# Patient Record
Sex: Female | Born: 1970
Health system: Southern US, Community
[De-identification: ages and names within clinical notes are randomized; demographics above are authoritative.]

## PROBLEM LIST (undated history)

## (undated) DIAGNOSIS — H469 Unspecified optic neuritis: Secondary | ICD-10-CM

## (undated) DIAGNOSIS — E559 Vitamin D deficiency, unspecified: Secondary | ICD-10-CM

## (undated) DIAGNOSIS — M7989 Other specified soft tissue disorders: Secondary | ICD-10-CM

## (undated) DIAGNOSIS — H53139 Sudden visual loss, unspecified eye: Secondary | ICD-10-CM

## (undated) DIAGNOSIS — G35 Multiple sclerosis: Secondary | ICD-10-CM

## (undated) DIAGNOSIS — G4730 Sleep apnea, unspecified: Secondary | ICD-10-CM

## (undated) DIAGNOSIS — I10 Essential (primary) hypertension: Secondary | ICD-10-CM

## (undated) DIAGNOSIS — F419 Anxiety disorder, unspecified: Secondary | ICD-10-CM

## (undated) DIAGNOSIS — E119 Type 2 diabetes mellitus without complications: Secondary | ICD-10-CM

## (undated) DIAGNOSIS — F32A Depression, unspecified: Secondary | ICD-10-CM

## (undated) DIAGNOSIS — F329 Major depressive disorder, single episode, unspecified: Secondary | ICD-10-CM

## (undated) DIAGNOSIS — G5600 Carpal tunnel syndrome, unspecified upper limb: Secondary | ICD-10-CM

## (undated) DIAGNOSIS — R1310 Dysphagia, unspecified: Secondary | ICD-10-CM

## (undated) DIAGNOSIS — K5900 Constipation, unspecified: Secondary | ICD-10-CM

## (undated) DIAGNOSIS — R0602 Shortness of breath: Secondary | ICD-10-CM

## (undated) DIAGNOSIS — E739 Lactose intolerance, unspecified: Secondary | ICD-10-CM

## (undated) DIAGNOSIS — D649 Anemia, unspecified: Secondary | ICD-10-CM

## (undated) DIAGNOSIS — M549 Dorsalgia, unspecified: Secondary | ICD-10-CM

## (undated) DIAGNOSIS — M2550 Pain in unspecified joint: Secondary | ICD-10-CM

## (undated) DIAGNOSIS — E785 Hyperlipidemia, unspecified: Secondary | ICD-10-CM

## (undated) HISTORY — DX: Dysphagia, unspecified: R13.10

## (undated) HISTORY — DX: Hyperlipidemia, unspecified: E78.5

## (undated) HISTORY — DX: Multiple sclerosis: G35

## (undated) HISTORY — DX: Anxiety disorder, unspecified: F41.9

## (undated) HISTORY — DX: Pain in unspecified joint: M25.50

## (undated) HISTORY — DX: Shortness of breath: R06.02

## (undated) HISTORY — DX: Dorsalgia, unspecified: M54.9

## (undated) HISTORY — DX: Sudden visual loss, unspecified eye: H53.139

## (undated) HISTORY — DX: Other specified soft tissue disorders: M79.89

## (undated) HISTORY — DX: Essential (primary) hypertension: I10

## (undated) HISTORY — PX: OTHER SURGICAL HISTORY: SHX169

## (undated) HISTORY — PX: WISDOM TOOTH EXTRACTION: SHX21

## (undated) HISTORY — DX: Carpal tunnel syndrome, unspecified upper limb: G56.00

## (undated) HISTORY — DX: Anemia, unspecified: D64.9

## (undated) HISTORY — DX: Lactose intolerance, unspecified: E73.9

## (undated) HISTORY — DX: Depression, unspecified: F32.A

## (undated) HISTORY — PX: CYST EXCISION: SHX5701

## (undated) HISTORY — PX: BREAST REDUCTION SURGERY: SHX8

## (undated) HISTORY — DX: Unspecified optic neuritis: H46.9

## (undated) HISTORY — DX: Vitamin D deficiency, unspecified: E55.9

## (undated) HISTORY — DX: Major depressive disorder, single episode, unspecified: F32.9

## (undated) HISTORY — DX: Constipation, unspecified: K59.00

## (undated) HISTORY — PX: TUBAL LIGATION: SHX77

## (undated) HISTORY — DX: Type 2 diabetes mellitus without complications: E11.9

---

## 1998-05-31 ENCOUNTER — Other Ambulatory Visit: Admission: RE | Admit: 1998-05-31 | Discharge: 1998-05-31 | Payer: Self-pay | Admitting: Obstetrics & Gynecology

## 1999-07-12 ENCOUNTER — Other Ambulatory Visit: Admission: RE | Admit: 1999-07-12 | Discharge: 1999-07-12 | Payer: Self-pay | Admitting: Obstetrics & Gynecology

## 2001-07-22 ENCOUNTER — Encounter: Admission: RE | Admit: 2001-07-22 | Discharge: 2001-10-20 | Payer: Self-pay | Admitting: Internal Medicine

## 2001-10-15 ENCOUNTER — Other Ambulatory Visit: Admission: RE | Admit: 2001-10-15 | Discharge: 2001-10-15 | Payer: Self-pay | Admitting: Internal Medicine

## 2001-11-04 ENCOUNTER — Encounter: Payer: Self-pay | Admitting: Emergency Medicine

## 2001-11-04 ENCOUNTER — Emergency Department (HOSPITAL_COMMUNITY): Admission: EM | Admit: 2001-11-04 | Discharge: 2001-11-04 | Payer: Self-pay | Admitting: Emergency Medicine

## 2002-01-30 ENCOUNTER — Encounter: Admission: RE | Admit: 2002-01-30 | Discharge: 2002-04-30 | Payer: Self-pay | Admitting: Internal Medicine

## 2002-05-15 ENCOUNTER — Encounter: Admission: RE | Admit: 2002-05-15 | Discharge: 2002-08-13 | Payer: Self-pay | Admitting: Internal Medicine

## 2003-12-25 ENCOUNTER — Emergency Department (HOSPITAL_COMMUNITY): Admission: AD | Admit: 2003-12-25 | Discharge: 2003-12-25 | Payer: Self-pay | Admitting: Family Medicine

## 2004-03-07 ENCOUNTER — Emergency Department (HOSPITAL_COMMUNITY): Admission: AD | Admit: 2004-03-07 | Discharge: 2004-03-07 | Payer: Self-pay | Admitting: Family Medicine

## 2005-09-05 ENCOUNTER — Emergency Department (HOSPITAL_COMMUNITY): Admission: EM | Admit: 2005-09-05 | Discharge: 2005-09-05 | Payer: Self-pay | Admitting: Family Medicine

## 2006-04-18 ENCOUNTER — Emergency Department (HOSPITAL_COMMUNITY): Admission: EM | Admit: 2006-04-18 | Discharge: 2006-04-18 | Payer: Self-pay | Admitting: Family Medicine

## 2007-09-03 ENCOUNTER — Encounter: Admission: RE | Admit: 2007-09-03 | Discharge: 2007-09-03 | Payer: Self-pay | Admitting: Plastic Surgery

## 2007-09-30 ENCOUNTER — Emergency Department (HOSPITAL_COMMUNITY): Admission: EM | Admit: 2007-09-30 | Discharge: 2007-09-30 | Payer: Self-pay | Admitting: Emergency Medicine

## 2007-10-15 ENCOUNTER — Encounter (INDEPENDENT_AMBULATORY_CARE_PROVIDER_SITE_OTHER): Payer: Self-pay | Admitting: Plastic Surgery

## 2007-10-15 ENCOUNTER — Ambulatory Visit (HOSPITAL_BASED_OUTPATIENT_CLINIC_OR_DEPARTMENT_OTHER): Admission: RE | Admit: 2007-10-15 | Discharge: 2007-10-16 | Payer: Self-pay | Admitting: Plastic Surgery

## 2008-01-29 ENCOUNTER — Encounter: Admission: RE | Admit: 2008-01-29 | Discharge: 2008-01-29 | Payer: Self-pay | Admitting: Internal Medicine

## 2008-04-04 ENCOUNTER — Emergency Department (HOSPITAL_COMMUNITY): Admission: EM | Admit: 2008-04-04 | Discharge: 2008-04-04 | Payer: Self-pay | Admitting: Family Medicine

## 2008-06-10 ENCOUNTER — Encounter: Admission: RE | Admit: 2008-06-10 | Discharge: 2008-06-10 | Payer: Self-pay | Admitting: Internal Medicine

## 2008-08-04 ENCOUNTER — Emergency Department (HOSPITAL_COMMUNITY): Admission: EM | Admit: 2008-08-04 | Discharge: 2008-08-04 | Payer: Self-pay | Admitting: Emergency Medicine

## 2010-09-22 ENCOUNTER — Ambulatory Visit: Payer: Self-pay | Admitting: Internal Medicine

## 2010-09-22 ENCOUNTER — Encounter: Payer: Self-pay | Admitting: Physician Assistant

## 2010-09-22 DIAGNOSIS — E1165 Type 2 diabetes mellitus with hyperglycemia: Secondary | ICD-10-CM | POA: Insufficient documentation

## 2010-09-22 DIAGNOSIS — E559 Vitamin D deficiency, unspecified: Secondary | ICD-10-CM | POA: Insufficient documentation

## 2010-09-22 DIAGNOSIS — E1169 Type 2 diabetes mellitus with other specified complication: Secondary | ICD-10-CM | POA: Insufficient documentation

## 2010-09-22 DIAGNOSIS — I10 Essential (primary) hypertension: Secondary | ICD-10-CM | POA: Insufficient documentation

## 2010-09-22 DIAGNOSIS — D509 Iron deficiency anemia, unspecified: Secondary | ICD-10-CM | POA: Insufficient documentation

## 2010-09-22 LAB — CONVERTED CEMR LAB: Blood Glucose, Fingerstick: 326

## 2010-09-23 LAB — CONVERTED CEMR LAB
BUN: 11 mg/dL (ref 6–23)
CO2: 23 meq/L (ref 19–32)
Calcium: 9.4 mg/dL (ref 8.4–10.5)
Chloride: 105 meq/L (ref 96–112)
Creatinine, Ser: 0.71 mg/dL (ref 0.40–1.20)
Eosinophils Relative: 1 % (ref 0–5)
Glucose, Bld: 281 mg/dL — ABNORMAL HIGH (ref 70–99)
HCT: 36.8 % (ref 36.0–46.0)
Hemoglobin: 11.3 g/dL — ABNORMAL LOW (ref 12.0–15.0)
Lymphocytes Relative: 40 % (ref 12–46)
MCHC: 30.7 g/dL (ref 30.0–36.0)
Monocytes Absolute: 0.7 10*3/uL (ref 0.1–1.0)
Monocytes Relative: 9 % (ref 3–12)
Neutro Abs: 4.2 10*3/uL (ref 1.7–7.7)
RBC: 4.71 M/uL (ref 3.87–5.11)
TSH: 1.644 microintl units/mL (ref 0.350–4.500)
Vit D, 25-Hydroxy: 18 ng/mL — ABNORMAL LOW (ref 30–89)

## 2010-09-26 ENCOUNTER — Encounter: Payer: Self-pay | Admitting: Physician Assistant

## 2010-09-27 ENCOUNTER — Encounter: Payer: Self-pay | Admitting: Physician Assistant

## 2010-09-27 ENCOUNTER — Ambulatory Visit: Payer: Self-pay | Admitting: Internal Medicine

## 2010-09-27 LAB — CONVERTED CEMR LAB
Iron: 26 ug/dL — ABNORMAL LOW (ref 42–145)
UIBC: 403 ug/dL

## 2010-10-11 ENCOUNTER — Ambulatory Visit: Payer: Self-pay | Admitting: Nurse Practitioner

## 2010-10-11 LAB — CONVERTED CEMR LAB
BUN: 10 mg/dL (ref 6–23)
CO2: 22 meq/L (ref 19–32)
Calcium: 9.2 mg/dL (ref 8.4–10.5)
Creatinine, Ser: 0.61 mg/dL (ref 0.40–1.20)
Glucose, Bld: 164 mg/dL — ABNORMAL HIGH (ref 70–99)
Sodium: 139 meq/L (ref 135–145)
Total CHOL/HDL Ratio: 2.7

## 2010-10-12 ENCOUNTER — Encounter (INDEPENDENT_AMBULATORY_CARE_PROVIDER_SITE_OTHER): Payer: Self-pay | Admitting: Nurse Practitioner

## 2010-10-24 ENCOUNTER — Ambulatory Visit: Payer: Self-pay | Admitting: Nurse Practitioner

## 2010-10-24 DIAGNOSIS — J029 Acute pharyngitis, unspecified: Secondary | ICD-10-CM

## 2010-10-24 LAB — CONVERTED CEMR LAB
Blood Glucose, Fingerstick: 185
Cholesterol, target level: 200 mg/dL
Glucose, Urine, Semiquant: 100
Hgb A1c MFr Bld: 9.9 %
Protein, U semiquant: 30
WBC Urine, dipstick: NEGATIVE

## 2010-11-24 ENCOUNTER — Ambulatory Visit: Payer: Self-pay | Admitting: Nurse Practitioner

## 2010-11-24 DIAGNOSIS — N926 Irregular menstruation, unspecified: Secondary | ICD-10-CM | POA: Insufficient documentation

## 2010-11-28 ENCOUNTER — Ambulatory Visit (HOSPITAL_COMMUNITY)
Admission: RE | Admit: 2010-11-28 | Discharge: 2010-11-28 | Payer: Self-pay | Source: Home / Self Care | Attending: Internal Medicine | Admitting: Internal Medicine

## 2010-11-28 ENCOUNTER — Telehealth (INDEPENDENT_AMBULATORY_CARE_PROVIDER_SITE_OTHER): Payer: Self-pay | Admitting: Nurse Practitioner

## 2010-12-27 ENCOUNTER — Encounter (INDEPENDENT_AMBULATORY_CARE_PROVIDER_SITE_OTHER): Payer: Self-pay | Admitting: *Deleted

## 2011-01-10 ENCOUNTER — Encounter (INDEPENDENT_AMBULATORY_CARE_PROVIDER_SITE_OTHER): Payer: Self-pay | Admitting: Nurse Practitioner

## 2011-01-11 LAB — CONVERTED CEMR LAB
Free Thyroxine Index: 2.3 (ref 1.0–3.9)
Prolactin: 24.2 ng/mL
T3 Uptake Ratio: 33.7 % (ref 22.5–37.0)

## 2011-01-17 NOTE — Assessment & Plan Note (Signed)
Summary: Diabetes/HTN   Vital Signs:  Patient profile:   40 year old female Menstrual status:  irregular LMP:     10/2010 Weight:      318.6 pounds BMI:     51.61 Temp:     98.5 degrees F oral Pulse rate:   76 / minute Pulse rhythm:   regular Resp:     20 per minute BP sitting:   140 / 90  (left arm) Cuff size:   regular  Vitals Entered By: Levon Hedger (October 24, 2010 8:31 AM)  Nutrition Counseling: Patient's BMI is greater than 25 and therefore counseled on weight management options. CC: 1 month followup DM....cold x 1 week and a half, Hypertension Management, Lipid Management Is Patient Diabetic? Yes Pain Assessment Patient in pain? no      CBG Result 185 CBG Device ID B  Does patient need assistance? Functional Status Self care Ambulation Normal LMP (date): 10/2010     Menstrual Status irregular Enter LMP: 10/2010   CC:  1 month followup DM....cold x 1 week and a half, Hypertension Management, and Lipid Management.  History of Present Illness:  Pt into the office for f/u on diabetes.  Diabetes - pt is checking her blood sugar three times per day. She presents today with her blood sugar log. Blood sugar values are over 200 on most days before breakfast. Pt states that she is taking meds as ordered. She just restarted on her medications 1 month ago. She has been without her meds from May to October.  Obesity - weight up 8 pounds since last visit  Diabetes Management History:      The patient is a 40 years old female who comes in for evaluation of DM Type 2.  She is (or has been) enrolled in the "Diabetic Education Program".  She states understanding of dietary principles and is following her diet appropriately.  No sensory loss is reported.  Self foot exams are not being performed.  She is checking home blood sugars.  She says that she is exercising.        Hypoglycemic symptoms are not occurring.  No hyperglycemic symptoms are reported.  Other comments  include: Pt has started going to [employer] class.        There are no symptoms to suggest diabetic complications.  No changes have been made to her treatment plan since last visit.    Hypertension History:      She denies headache, chest pain, and palpitations.  No medications today yet due to fasting status.        Positive major cardiovascular risk factors include diabetes and hypertension.  Negative major cardiovascular risk factors include female age less than 68 years old and non-tobacco-user status.        Further assessment for target organ damage reveals no history of ASHD, stroke/TIA, or peripheral vascular disease.    Lipid Management History:      Positive NCEP/ATP III risk factors include diabetes and hypertension.  Negative NCEP/ATP III risk factors include female age less than 78 years old, no history of early menopause without estrogen hormone replacement, HDL cholesterol greater than 60, non-tobacco-user status, no ASHD (atherosclerotic heart disease), no prior stroke/TIA, no peripheral vascular disease, and no history of aortic aneurysm.        The patient states that she does not know about the "Therapeutic Lifestyle Change" diet.  The patient does not know about adjunctive measures for cholesterol lowering.  Adjunctive measures started  by the patient include aerobic exercise.  Comments include: labs done during recent visit reviewed with pt today.       Habits & Providers  Exercise-Depression-Behavior     Does Patient Exercise: yes  Allergies (verified): No Known Drug Allergies  Social History: Does Patient Exercise:  yes  Review of Systems General:  Complains of fatigue. ENT:  Complains of nasal congestion; Uses VIcs Vapor rub at night. She has been taking Delsym for cough and Sudafed PE OTC.Marland Kitchen CV:  Denies chest pain or discomfort. Resp:  Complains of cough. Endo:  Complains of excessive urination; denies excessive thirst.  Physical Exam  General:   alert.  obese Head:  normocephalic.   Ears:  ear piercing(s) noted.   Bil ears with clear fluit behind TM Nose:  nasal congestion Lungs:  normal breath sounds.   Heart:  normal rate and regular rhythm.   Abdomen:  normal bowel sounds.   Msk:  normal ROM.   Neurologic:  gait normal.   Skin:  color normal.   Psych:  Oriented X3.    Diabetes Management Exam:    Foot Exam (with socks and/or shoes not present):       Inspection:          Left foot: normal          Right foot: normal       Nails:          Left foot: thickened          Right foot: thickened   Impression & Recommendations:  Problem # 1:  DIABETES MELLITUS, TYPE II (ICD-250.00) increase insulin to 34 units daily flu vaccine given today pt has started diabetes group Her updated medication list for this problem includes:    Janumet 50-500 Mg Tabs (Sitagliptin-metformin hcl) .Marland Kitchen... Take 1 tablet by mouth two times a day for diabetes    Lantus Solostar 100 Unit/ml Soln (Insulin glargine) ..... Inject 34 units at bedtime for diabetes    Lisinopril 20 Mg Tabs (Lisinopril) .Marland Kitchen... Take 1 tablet by mouth once a day for blood pressure  Orders: Capillary Blood Glucose/CBG (82948) Hemoglobin A1C (83036) UA Dipstick w/o Micro (manual) (29562) T-Urine Microalbumin w/creat. ratio [phone])  Her updated medication list for this problem includes:    Janumet 50-500 Mg Tabs (Sitagliptin-metformin hcl) .Marland Kitchen... Take 1 tablet by mouth two times a day for diabetes    Lantus Solostar 100 Unit/ml Soln (Insulin glargine) ..... Inject 24 units at bedtime for diabets    Lisinopril 20 Mg Tabs (Lisinopril) .Marland Kitchen... Take 1 tablet by mouth once a day for blood pressure  Problem # 2:  HYPERTENSION (ICD-401.9) BP is elevated today - pt has not taken her meds today DASH diet will recheck at next visit  Her updated medication list for this problem includes:    Lisinopril 20 Mg Tabs (Lisinopril) .Marland Kitchen... Take 1 tablet by mouth once a day for  blood pressure  Problem # 3:  PHARYNGITIS, ACUTE (ICD-462) advised conservative therapy meds without decongestants  Problem # 4:  OBESITY (ICD-278.00) advised pt that she needs to decrease weight, increase activities  Complete Medication List: 1)  Janumet 50-500 Mg Tabs (Sitagliptin-metformin hcl) .... Take 1 tablet by mouth two times a day for diabetes 2)  Lantus Solostar 100 Unit/ml Soln (Insulin glargine) .... Inject 34 units at bedtime for diabetes 3)  Lisinopril 20 Mg Tabs (Lisinopril) .... Take 1 tablet by mouth once a day for blood pressure 4)  Onetouch Ultra Blue Strp (  Glucose blood) .... Check sugars three times a day before meals 5)  Vitamin D (ergocalciferol) 50000 Unit Caps (Ergocalciferol) .... Take one by mouth once a week for 12 weeks 6)  Nu-iron 150 Mg Caps (Polysaccharide iron complex) .... Take 1 capsule by mouth two times a day  Diabetes Management Assessment/Plan:      The following lipid goals have been established for the patient: Total cholesterol goal of 200; LDL cholesterol goal of 100; HDL cholesterol goal of 40; Triglyceride goal of 150.  Her blood pressure goal is < 130/80.    Hypertension Assessment/Plan:      The patient's hypertensive risk group is category C: Target organ damage and/or diabetes.  Her calculated 10 year risk of coronary heart disease is 2 %.  Today's blood pressure is 140/90.  Her blood pressure goal is < 130/80.  Lipid Assessment/Plan:      Based on NCEP/ATP III, the patient's risk factor category is "history of diabetes".  The patient's lipid goals are as follows: Total cholesterol goal is 200; LDL cholesterol goal is 100; HDL cholesterol goal is 40; Triglyceride goal is 150.    Patient Instructions: 1)  You have been given the flu vaccine today.  This will only protect you from the FLU virus 2)  For your current cold symptoms continue to take coricidan or Delsym over the counter. 3)  May also take vitamin C or Airborne to help build up  your immune system. 4)  Drink warm steamy liquids such at tea to help soothe your throat 5)  Diabetes - Increase insulin to 34 units nightly. 6)  your Hgba1c = 9.9.  The goal is less than 7.  This should get better with medication and diet changes 7)  Follow up with n.martin,fnp in 4-6 weeks for diabetes. 8)  Will need cbg, u/a, foot check, pneumovax and recheck blood pressure. 9)  Will need to schedule CPE and retasure.   Orders Added: 1)  Capillary Blood Glucose/CBG [82948] 2)  Est. Patient Level IV [16109] 3)  Hemoglobin A1C [83036] 4)  UA Dipstick w/o Micro (manual) [81002] 5)  T-Urine Microalbumin w/creat. ratio [[id number]]    Prevention & Chronic Care Immunizations   Influenza vaccine: Not documented    Tetanus booster: Not documented    Pneumococcal vaccine: Not documented  Other Screening   Pap smear: Not documented   Smoking status: never  (09/22/2010)  Diabetes Mellitus   HgbA1C: 9.9  (10/24/2010)   HgbA1C action/deferral: Ordered  (10/24/2010)    Eye exam: Not documented    Foot exam: yes  (10/24/2010)   Foot exam action/deferral: Do today   High risk foot: Not documented   Foot care education: Done  (10/24/2010)    Urine microalbumin/creatinine ratio: Not documented   Urine microalbumin action/deferral: Ordered   Urine microalbumin/cr due: 10/25/2011  Lipids   Total Cholesterol: 183  (10/11/2010)   LDL: 99  (10/11/2010)   LDL Direct: Not documented   HDL: 68  (10/11/2010)   Triglycerides: 82  (10/11/2010)  Hypertension   Last Blood Pressure: 140 / 90  (10/24/2010)   Serum creatinine: 0.61  (10/11/2010)   Serum potassium 5.0  (10/11/2010)  Self-Management Support :    Diabetes self-management support: Not documented    Hypertension self-management support: Not documented   Nursing Instructions: Give Flu vaccine today   Diabetic Foot Exam Foot Inspection Is there a history of a foot ulcer?  No Is there a foot ulcer  now?              No Can the patient see the bottom of their feet?          Yes Are the shoes appropriate in style and fit?          Yes Is there swelling or an abnormal foot shape?          No Are the toenails long?                Yes Are the toenails thick?                Yes Are the toenails ingrown?              No Is there heavy callous build-up?              No Is there pain in the calf muscle (Intermittent claudication) when walking?    NoIs there a claw toe deformity?              No Is there elevated skin temperature?            No Is there limited ankle dorsiflexion?            No Is there foot or ankle muscle weakness?            No  Diabetic Foot Care Education Patient educated on appropriate care of diabetic feet.  Pulse Check          Right Foot          Left Foot Dorsalis Pedis:        normal            normal   Laboratory Results   Urine Tests  Date/Time Received: October 24, 2010 9:15 AM   Routine Urinalysis   Color: yellow Glucose: 100   (Normal Range: Negative) Bilirubin: negative   (Normal Range: Negative) Ketone: trace (5)   (Normal Range: Negative) Spec. Gravity: >=1.030   (Normal Range: 1.003-1.035) Blood: negative   (Normal Range: Negative) pH: 5.5   (Normal Range: 5.0-8.0) Protein: 30   (Normal Range: Negative) Urobilinogen: 0.2   (Normal Range: 0-1) Nitrite: negative   (Normal Range: Negative) Leukocyte Esterace: negative   (Normal Range: Negative)     Blood Tests   Date/Time Received: October 24, 2010 9:16 AM   HGBA1C: 9.9%   (Normal Range: Non-Diabetic - 3-6%   Control Diabetic - 6-8%) CBG Random:: 185mg /dL      Appended Document: Diabetes/HTN     Allergies: No Known Drug Allergies   Complete Medication List: 1)  Janumet 50-500 Mg Tabs (Sitagliptin-metformin hcl) .... Take 1 tablet by mouth two times a day for diabetes 2)  Lantus Solostar 100 Unit/ml Soln (Insulin glargine) .... Inject 34 units at bedtime for diabetes 3)   Lisinopril 20 Mg Tabs (Lisinopril) .... Take 1 tablet by mouth once a day for blood pressure 4)  Onetouch Ultra Blue Strp (Glucose blood) .... Check sugars three times a day before meals 5)  Vitamin D (ergocalciferol) 50000 Unit Caps (Ergocalciferol) .... Take one by mouth once a week for 12 weeks 6)  Nu-iron 150 Mg Caps (Polysaccharide iron complex) .... Take 1 capsule by mouth two times a day  Other Orders: Flu Vaccine 66yrs + (40102) Admin 1st Vaccine (72536)   Orders Added: 1)  Flu Vaccine 48yrs + [90658] 2)  Admin 1st Vaccine [64403]  Immunizations Administered:  Influenza Vaccine # 1:    Vaccine Type: Fluvax 3+    Site: right deltoid    Mfr: [email or web address]    Dose: 0.5 ml    Route: IM    Given by: Levon Hedger    Exp. Date: 06/17/2011    Lot #: [id number]    VIS given: 07/12/10 version given October 24, 2010.  Flu Vaccine Consent Questions:    Do you have a history of severe allergic reactions to this vaccine? no    Any prior history of allergic reactions to egg and/or gelatin? no    Do you have a sensitivity to the preservative Thimersol? no    Do you have a past history of Guillan-Barre Syndrome? no    Do you currently have an acute febrile illness? no    Have you ever had a severe reaction to latex? no    Vaccine information given and explained to patient? yes    Are you currently pregnant? no    ndc  [phone]  Immunizations Administered:  Influenza Vaccine # 1:    Vaccine Type: Fluvax 3+    Site: right deltoid    Mfr: GlaxoSmithKline    Dose: 0.5 ml    Route: IM    Given by: Levon Hedger    Exp. Date: 06/17/2011    Lot #: [id number]    VIS given: 07/12/10 version given October 24, 2010.

## 2011-01-17 NOTE — Letter (Signed)
Summary: TEST ORDER FORM//[facility]//APPT DATE & TIME  TEST ORDER FORM//[facility]//APPT DATE & TIME   Imported By: Arta Bruce 11/25/2010 11:16:36  _____________________________________________________________________  External Attachment:    Type:   Image     Comment:   External Document

## 2011-01-17 NOTE — Letter (Signed)
Summary: NUTRITION /SUSIE  NUTRITION /SUSIE   Imported By: Arta Bruce 10/06/2010 10:25:11  _____________________________________________________________________  External Attachment:    Type:   Image     Comment:   External Document

## 2011-01-17 NOTE — Assessment & Plan Note (Signed)
Summary: Diabetes   Vital Signs:  Patient profile:   40 year old female Menstrual status:  irregular Weight:      322.1 pounds BMI:     52.18 Temp:     97.8 degrees F oral Pulse rate:   64 / minute Pulse rhythm:   regular Resp:     16 per minute BP sitting:   126 / 90  (left arm) Cuff size:   regular  Vitals Entered By: Levon Hedger (November 24, 2010 3:11 PM)  Nutrition Counseling: Patient's BMI is greater than 25 and therefore counseled on weight management options. CC: follow-up visit...cycle stays on extended time with clots and cramps, Hypertension Management Is Patient Diabetic? Yes Pain Assessment Patient in pain? yes     Location: hip, knee CBG Result 186 CBG Device ID B  Does patient need assistance? Functional Status Self care Ambulation Normal   CC:  follow-up visit...cycle stays on extended time with clots and cramps and Hypertension Management.  History of Present Illness:  Pt int the office for diabetes f/u She has restarted on meds since her last visit.  Menses - "My cycle stays on all the time" Starting since the beginning of 2011 no cramps just mainly heavy flow. She is taking the iron pills as ordered  Diabetes Management History:      The patient is a 40 years old female who comes in for evaluation of DM Type 2.  She is (or has been) enrolled in the "Diabetic Education Program".  She states understanding of dietary principles and is following her diet appropriately.  No sensory loss is reported.  Self foot exams are not being performed.  She is checking home blood sugars.  She says that she is exercising.        Hypoglycemic symptoms are not occurring.  No hyperglycemic symptoms are reported.  Other comments include: pt has restarted meds since last visit in office.        No changes have been made to her treatment plan since last visit.    Hypertension History:      She denies headache, chest pain, and palpitations.  She notes no problems with  any antihypertensive medication side effects.        Positive major cardiovascular risk factors include diabetes and hypertension.  Negative major cardiovascular risk factors include female age less than 96 years old, no history of hyperlipidemia, and non-tobacco-user status.        Further assessment for target organ damage reveals no history of ASHD, cardiac end-organ damage (CHF/LVH), stroke/TIA, peripheral vascular disease, renal insufficiency, or hypertensive retinopathy.     Habits & Providers  Alcohol-Tobacco-Diet     Alcohol drinks/day: 0     Tobacco Status: never  Exercise-Depression-Behavior     Does Patient Exercise: yes     Drug Use: no  Allergies (verified): No Known Drug Allergies  Review of Systems General:  Complains of fatigue. CV:  Denies fatigue. Resp:  Denies cough. GI:  Complains of abdominal pain and nausea; denies vomiting; starting in early 2011 menses started to be very heavy with clotting. No cramping.  . GU:  Complains of abnormal vaginal bleeding; denies discharge. MS:  Complains of low back pain.  Physical Exam  General:  alert.   Head:  normocephalic.   Lungs:  normal breath sounds.   Heart:  normal rate and regular rhythm.   Abdomen:  normal bowel sounds.   Msk:  normal ROM.   Neurologic:  alert &  oriented X3.   Skin:  color normal.   Psych:  Oriented X3.     Impression & Recommendations:  Problem # 1:  DIABETES MELLITUS, TYPE II (ICD-250.00) Pt has restarted her meds as ordered advised her to keep taking as ordered Her updated medication list for this problem includes:    Janumet 50-500 Mg Tabs (Sitagliptin-metformin hcl) .Marland Kitchen... Take 1 tablet by mouth two times a day for diabetes    Lantus Solostar 100 Unit/ml Soln (Insulin glargine) ..... Inject 34 units at bedtime for diabetes    Lisinopril 20 Mg Tabs (Lisinopril) .Marland Kitchen... Take 1 tablet by mouth once a day for blood pressure  Orders: Capillary Blood Glucose/CBG (16109)  Problem # 2:   HYPERTENSION (ICD-401.9) BP is doing well Her updated medication list for this problem includes:    Lisinopril 20 Mg Tabs (Lisinopril) .Marland Kitchen... Take 1 tablet by mouth once a day for blood pressure  Problem # 3:  ANEMIA-IRON DEFICIENCY (ICD-280.9) pt is taking meds as ordered Her updated medication list for this problem includes:    Nu-iron 150 Mg Caps (Polysaccharide iron complex) .Marland Kitchen... Take 1 capsule by mouth two times a day  Problem # 4:  OBESITY (ICD-278.00) pt is mindful of need to lose weight  Problem # 5:  IRREGULAR MENSES (ICD-626.4) will send for u/u.  Orders: Ultrasound (Ultrasound)  Complete Medication List: 1)  Janumet 50-500 Mg Tabs (Sitagliptin-metformin hcl) .... Take 1 tablet by mouth two times a day for diabetes 2)  Lantus Solostar 100 Unit/ml Soln (Insulin glargine) .... Inject 34 units at bedtime for diabetes 3)  Lisinopril 20 Mg Tabs (Lisinopril) .... Take 1 tablet by mouth once a day for blood pressure 4)  Onetouch Ultra Blue Strp (Glucose blood) .... Check sugars three times a day before meals 5)  Vitamin D (ergocalciferol) 50000 Unit Caps (Ergocalciferol) .... Take one by mouth once a week for 12 weeks 6)  Nu-iron 150 Mg Caps (Polysaccharide iron complex) .... Take 1 capsule by mouth two times a day  Diabetes Management Assessment/Plan:      The following lipid goals have been established for the patient: Total cholesterol goal of 200; LDL cholesterol goal of 100; HDL cholesterol goal of 40; Triglyceride goal of 150.  Her blood pressure goal is < 130/80.    Hypertension Assessment/Plan:      The patient's hypertensive risk group is category C: Target organ damage and/or diabetes.  Her calculated 10 year risk of coronary heart disease is 2 %.  Today's blood pressure is 126/90.  Her blood pressure goal is < 130/80.  Patient Instructions: 1)  Diabetes - Keep taking your blood sugar medications as ordered 2)  Blood pressure - doing well.  Keep up the good work with  watch sodium in the diet.  Keep taking lisinopril 3)  Abnormal vagainal bleeding - You will be referred for ultrasound.  Depending on the results will decide how to proceed. 4)  Follow up in 2 months for diabetes. 5)  You will need cbg, hgba1c, u/a, pneumovax and review results of ultrasound if not already done  Diabetic Foot Exam Foot Inspection Is there a history of a foot ulcer?              No Is there a foot ulcer now?              No Can the patient see the bottom of their feet?          No Are the  shoes appropriate in style and fit?          Yes Is there swelling or an abnormal foot shape?          No Are the toenails long?                Yes Are the toenails thick?                Yes Are the toenails ingrown?              No Is there heavy callous build-up?              No Is there pain in the calf muscle (Intermittent claudication) when walking?    NoIs there a claw toe deformity?              No Is there elevated skin temperature?            No Is there limited ankle dorsiflexion?            No Is there foot or ankle muscle weakness?            No  Diabetic Foot Care Education Patient educated on appropriate care of diabetic feet.  Pulse Check          Right Foot          Left Foot Dorsalis Pedis:        normal            normal    Orders Added: 1)  Capillary Blood Glucose/CBG [82948] 2)  Est. Patient Level III [60630] 3)  Ultrasound [Ultrasound]

## 2011-01-17 NOTE — Assessment & Plan Note (Signed)
Summary: NP:  DM2, HTN   Vital Signs:  Patient profile:   40 year old female Height:      66 inches Weight:      310.8 pounds BMI:     50.35 Temp:     98.0 degrees F oral Pulse rate:   88 / minute Pulse rhythm:   regular Resp:     20 per minute BP sitting:   156 / 94  (left arm) Cuff size:   regular  Vitals Entered By: CMA Linzie Collin CC: new patient visit, DM and BP, currently not on medication but was previously on medications for DM and BP Is Patient Diabetic? Yes Pain Assessment Patient in pain? no      CBG Result 326  Does patient need assistance? Functional Status Self care Ambulation Normal   CC:  new patient visit, DM and BP, and currently not on medication but was previously on medications for DM and BP.  History of Present Illness: New pt.  Previously followed by Dr. Renae Gloss. Previously taking: Janumet ? dose two times a day  Lantus 24 units Vit D Lisinopril 20 mg once daily  She has been out of meds since the end of May.  She has been trying to watch her diet and exercise.  She has felt lightheaded and nauseated at times.  NOtes polydipsia.  States her sugars were ok on above meds. . .  but does admit she had an A1C of 10 last time.  BPs usually ok with Lisinopril but were going up when last seen.  Had retasure in May at Dr. Mathews Robinsons.    Habits & Providers  Alcohol-Tobacco-Diet     Tobacco Status: never  Exercise-Depression-Behavior     Drug Use: no  Allergies (verified): No Known Drug Allergies  Past History:  Past Medical History: Diabetes mellitus, type II Hypertension Anemia-iron deficiency h/o menorrhagia Vitamin D deficiency  Past Surgical History: s/p breast reduction 2009 oral cyst removed s/p ovarian cyst and fallopian tube cyst removal  Family History: Family History Breast cancer 1st degree relative <50 Colon CA - Grandmother Family History Diabetes 1st degree relative - Mom CAD - grandmother and grandfather  (paternal)  Social History: unemployed single no kids Never Smoked Alcohol use-no Drug use-no Smoking Status:  never Drug Use:  no  Review of Systems      See HPI General:  Denies chills and fever. CV:  Denies chest pain or discomfort, fainting, and shortness of breath with exertion. GI:  Denies diarrhea. GU:  Denies dysuria.  Physical Exam  General:  alert, well-developed, and well-nourished.   Head:  normocephalic and atraumatic.   Eyes:  pupils equal, pupils round, and pupils reactive to light.   Neck:  supple.   Lungs:  normal breath sounds.   Heart:  normal rate and regular rhythm.   Abdomen:  soft and non-tender.   Extremities:  no edema  Neurologic:  alert & oriented X3 and cranial nerves II-XII intact.   Psych:  normally interactive.     Impression & Recommendations:  Problem # 1:  DIABETES MELLITUS, TYPE II (ICD-250.00)  restart Janumet and Lantus at previous dosages realize she will prob need adjustments set up with Susie for diet ed and enroll in [employer] group  Her updated medication list for this problem includes:    Janumet 50-500 Mg Tabs (Sitagliptin-metformin hcl) .Marland Kitchen... Take 1 tablet by mouth two times a day for diabetes    Lantus Solostar 100 Unit/ml Soln (Insulin glargine) .Marland KitchenMarland KitchenMarland KitchenMarland Kitchen  Inject 24 units at bedtime for diabets    Lisinopril 20 Mg Tabs (Lisinopril) .Marland Kitchen... Take 1 tablet by mouth once a day for blood pressure  Orders: T-Comprehensive Metabolic Panel ([address]-60454)  Problem # 2:  HYPERTENSION (ICD-401.9)  restart Lisinopril check labs f/u one month  Her updated medication list for this problem includes:    Lisinopril 20 Mg Tabs (Lisinopril) .Marland Kitchen... Take 1 tablet by mouth once a day for blood pressure  Orders: T-Comprehensive Metabolic Panel (09811-91478) T-TSH (29562-13086)  Problem # 3:  ANEMIA-IRON DEFICIENCY (ICD-280.9)  check labs 2/2 menorrhagia get records form prior PCP eventually set up for CPP  Orders: T-CBC w/Diff  ([id number])  Problem # 4:  VITAMIN D DEFICIENCY (ICD-268.9)  check levels  Orders: T-Vitamin D (25-Hydroxy) (0011001100)  Complete Medication List: 1)  Janumet 50-500 Mg Tabs (Sitagliptin-metformin hcl) .... Take 1 tablet by mouth two times a day for diabetes 2)  Lantus Solostar 100 Unit/ml Soln (Insulin glargine) .... Inject 24 units at bedtime for diabets 3)  Lisinopril 20 Mg Tabs (Lisinopril) .... Take 1 tablet by mouth once a day for blood pressure 4)  Onetouch Ultra Blue Strp (Glucose blood) .... Check sugars three times a day before meals  Patient Instructions: 1)  Sign forms to get prior records from Dr Andi Devon. 2)  Schedule FLP at your convenience one day before your follow up appt. 3)  Check sugars three times a day before meals and record and bring in to your next appt. 4)  Schedule appt with Susie Piper. 5)  Enroll in [place] group.  They meet every 2nd and 4th Wednesday from 1-2 pm in our lobby. 6)  I have sent your prescriptions to our pharmacy on [facility].  Call tomorrow to see if you can pick up.  Bring your 2010 tax returns when you pick up your medicines. 7)  Schedule BP check and BMET with the nurse in 2 weeks.  Dx 401.1.  Notify provider if BP > 140/90 or < 110/60. 8)  Schedule follow up in 1 month for diabetes and blood pressure. Prescriptions: ONETOUCH ULTRA BLUE  STRP (GLUCOSE BLOOD) check sugars three times a day before meals  #90 x 11   Entered and Authorized by:   Tereso Newcomer PA-C   Signed by:   Tereso Newcomer PA-C on 09/22/2010   Method used:   Print then Give to Patient   RxID:   [id number] LISINOPRIL 20 MG TABS (LISINOPRIL) Take 1 tablet by mouth once a day for blood pressure  #30 x 5   Entered and Authorized by:   Tereso Newcomer PA-C   Signed by:   Tereso Newcomer PA-C on 09/22/2010   Method used:   Faxed to ...       [facility] (retail)       [address] [place], [place]  [address]       Ph:  [phone]       Fax: [phone]   RxID:   [id number] LANTUS SOLOSTAR 100 UNIT/ML SOLN (INSULIN GLARGINE) Inject 24 units at bedtime for diabets  #1 mo. supply x 11   Entered and Authorized by:   Tereso Newcomer PA-C   Signed by:   Tereso Newcomer PA-C on 09/22/2010   Method used:   Faxed to ...       [facility] (retail)       [address]  [place], [place]  [address]       Ph: [phone]       Fax: [phone]   RxID:   [id number] JANUMET 50-500 MG TABS (SITAGLIPTIN-METFORMIN HCL) Take 1 tablet by mouth two times a day for diabetes  #60 x 5   Entered and Authorized by:   Tereso Newcomer PA-C   Signed by:   Tereso Newcomer PA-C on 09/22/2010   Method used:   Faxed to ...       [facility] - [facility] (retail)       [address] [place], [place]  [address]       Ph: [phone]       Fax: [phone]   RxID:   [id number]

## 2011-01-17 NOTE — Letter (Signed)
Summary: Lipid Letter  [facility]  [address] [place], [place] [address]   Phone: [phone]  Fax: [phone]    10/12/2010  Katrina Montes [address] [place], [place]  [address]  Dear Katrina Montes:  We have carefully reviewed your last lipid profile from 10/11/2010 and the results are noted below with a summary of recommendations for lipid management.    Cholesterol:       183     Goal: less than 200   HDL "good" Cholesterol:   68     Goal: greater than 40   LDL "bad" Cholesterol:   99     Goal: less than 70   Triglycerides:       82     Goal: less than 150    Labs show that  your " Bad Cholesterol" is slightly above the goal of 70. Your value is 99.  But overall the panel looks ok.  No need for medications for cholesterol.  Just try to monitor fried fatty foods. Your blood sugar was high.  Be sure you are taking your diabetes medications DAILY as ordered.  Monitor your diet and try to get some exercise into your routine such as walking 10-15 minutes per day.      Current Medications: 1)    Janumet 50-500 Mg Tabs (Sitagliptin-metformin hcl) .... Take 1 tablet by mouth two times a day for diabetes 2)    Lantus Solostar 100 Unit/ml Soln (Insulin glargine) .... Inject 24 units at bedtime for diabets 3)    Lisinopril 20 Mg Tabs (Lisinopril) .... Take 1 tablet by mouth once a day for blood pressure 4)    Onetouch Ultra Blue  Strp (Glucose blood) .... Check sugars three times a day before meals 5)    Vitamin D (ergocalciferol) 50000 Unit Caps (Ergocalciferol) .... Take one by mouth once a week for 12 weeks 6)    Nu-iron 150 Mg Caps (Polysaccharide iron complex) .... Take 1 capsule by mouth two times a day  If you have any questions, please call. We appreciate being able to work with you.   Sincerely,    [facility]

## 2011-01-17 NOTE — Letter (Signed)
Summary: PT INFORMATION SHEET  PT INFORMATION SHEET   Imported By: Arta Bruce 09/26/2010 16:02:35  _____________________________________________________________________  External Attachment:    Type:   Image     Comment:   External Document

## 2011-01-18 ENCOUNTER — Ambulatory Visit: Admit: 2011-01-18 | Payer: Self-pay | Admitting: Physician Assistant

## 2011-01-19 NOTE — Progress Notes (Signed)
Summary: Ultrasound results  Phone Note Outgoing Call   Summary of Call: notify pt that her ultrasound shows that she does have a thick endometrium as discussed during recent office visit. It is suggested that pt have a repeat ultrasound within 2 weeks after her next period to see if the lining ever thins but from what i can recall from the history she is constantly on her menses. Will refer her to GYN for further workup at [facility]  Initial call taken by: Lehman Prom FNP,  November 28, 2010 2:12 PM  Follow-up for Phone Call        left message on machine for pt to return call to the office. Levon Hedger  November 28, 2010 5:03 PM   Additional Follow-up for Phone Call Additional follow up Details #1::        PATIEN RETURN YOUR CALL CELL [phone] Additional Follow-up by: Domenic Polite,  November 29, 2010 11:57 AM    Additional Follow-up for Phone Call Additional follow up Details #2::    Levon Hedger  November 29, 2010 3:25 PM Left message on machine for pt to return call to the office.  pt informed of above information. Follow-up by: Levon Hedger,  November 29, 2010 3:40 PM

## 2011-01-19 NOTE — Letter (Signed)
Summary: *HSN Results Follow up  [facility]  [address] [place], [place] [address]   Phone: [phone]  Fax: [phone]      12/27/2010   [name] [address] [place], [place]  [address]   Dear  Ms. Katrina Montes,                          Comments: I been trying to reach you by phone [phone] and leave you a message  you have an appt 02-14-11 @ 5pm [facility] [address] and is first come first serve so you can get there @ 4:30 pm  Tahnk you .       _________________________________________________________ If you have any questions, please contact our office                     Sincerely,  Cheryll Dessert [facility]

## 2011-01-19 NOTE — Letter (Signed)
Summary: GYN CLINIC  GYN CLINIC   Imported By: Arta Bruce 01/12/2011 09:51:57  _____________________________________________________________________  External Attachment:    Type:   Image     Comment:   External Document

## 2011-01-31 ENCOUNTER — Other Ambulatory Visit: Payer: Self-pay | Admitting: Obstetrics and Gynecology

## 2011-01-31 ENCOUNTER — Encounter (INDEPENDENT_AMBULATORY_CARE_PROVIDER_SITE_OTHER): Payer: Self-pay | Admitting: Nurse Practitioner

## 2011-02-09 ENCOUNTER — Telehealth (INDEPENDENT_AMBULATORY_CARE_PROVIDER_SITE_OTHER): Payer: Self-pay | Admitting: Nurse Practitioner

## 2011-02-09 ENCOUNTER — Other Ambulatory Visit (HOSPITAL_COMMUNITY): Payer: Self-pay | Admitting: Internal Medicine

## 2011-02-09 DIAGNOSIS — N83209 Unspecified ovarian cyst, unspecified side: Secondary | ICD-10-CM

## 2011-02-14 NOTE — Progress Notes (Signed)
Summary: Needs repeat u/s  Phone Note Outgoing Call   Summary of Call: Received note from GYN that Dr. Arelia Sneddon would like pt to have a f/u ultrasound (order done) contact pt and find out when is the best time to schedule in the next 2-3 weeks. Contact K. Edwards, CMA and Dr. Arelia Sneddon [phone]) and let them know the date of the appt as he wanted to be notified  when appt was made  Initial call taken by: Lehman Prom FNP,  February 09, 2011 10:08 AM  Follow-up for Phone Call        I lvm  for pt to call me back  at her earliest convinience .Marland KitchenCheryll Dessert  February 10, 2011 9:19 AM  Pt call me back and she is aware of her appt 02-23-11 @ 8AM and also I call Dr Arelia Sneddon assistant she is aware of that   Follow-up by: Cheryll Dessert,  February 10, 2011 10:05 AM  New Problems: OVARIAN CYST (ICD-620.2) IRREGULAR MENSES (ICD-626.4)   New Problems: OVARIAN CYST (ICD-620.2) IRREGULAR MENSES (ICD-626.4)

## 2011-02-14 NOTE — Letter (Signed)
[facility]: [facility]   Imported By: Arta Bruce 02/09/2011 12:24:27  _____________________________________________________________________  External Attachment:    Type:   Image     Comment:   External Document

## 2011-02-23 ENCOUNTER — Ambulatory Visit (HOSPITAL_COMMUNITY)
Admission: RE | Admit: 2011-02-23 | Discharge: 2011-02-23 | Disposition: A | Discharge status: Home / Self Care | Payer: Self-pay | Source: Ambulatory Visit | Attending: Internal Medicine | Admitting: Internal Medicine

## 2011-02-23 DIAGNOSIS — N949 Unspecified condition associated with female genital organs and menstrual cycle: Secondary | ICD-10-CM | POA: Insufficient documentation

## 2011-02-23 DIAGNOSIS — N83209 Unspecified ovarian cyst, unspecified side: Secondary | ICD-10-CM

## 2011-02-23 DIAGNOSIS — N938 Other specified abnormal uterine and vaginal bleeding: Secondary | ICD-10-CM | POA: Insufficient documentation

## 2011-02-23 DIAGNOSIS — R9389 Abnormal findings on diagnostic imaging of other specified body structures: Secondary | ICD-10-CM | POA: Insufficient documentation

## 2011-05-02 NOTE — Op Note (Signed)
Katrina Montes, Katrina Montes              ACCOUNT NO.:  [id number]   MEDICAL RECORD NO.:  [id number]          PATIENT TYPE:  AMB   LOCATION:  DSC                          FACILITY:  [facility]   PHYSICIAN:  Mary Contogiannis, M.D.DATE OF BIRTH:  10-23-1971   DATE OF PROCEDURE:  DATE OF DISCHARGE:                               OPERATIVE REPORT   PREOPERATIVE DIAGNOSIS:  Bilateral macromastia.   POSTOPERATIVE DIAGNOSIS:  Bilateral macromastia.   PROCEDURE:  Bilateral reduction mammoplasties.   ATTENDING SURGEON:  Brantley Persons, M.D.   ANESTHESIA:  General endotracheal.   ANESTHESIOLOGIST:  Dr. Gypsy Balsam.   ESTIMATED BLOOD LOSS:  200 mL.   FLUID REPLACEMENT:  3200 mL crystalloid.   URINE OUTPUT:  320 mL.   COMPLICATIONS:  None.   INDICATIONS FOR PROCEDURE:  The patient is a 40 year old African  American female who has bilateral macromastia that is clinically  symptomatic.  She presents to undergo bilateral reduction mammoplasties.   JUSTIFICATION FOR OVERNIGHT STAY:  Progressive pain control along with  ambulation and monitoring of the nipples and breast flaps.  Her blood  sugars will also be monitored as she is a diabetic.   PROCEDURE:  The patient was marked preop holding area for the future  bilateral reduction mammoplasties in the pattern of Wise.  She was then  taken back to the OR, placed on table supine position.  After adequate  general endotracheal anesthesia was obtained the patient's chest and  breasts were prepped with Betadine and alcohol draped in sterile  fashion.  The base of the breast was then injected 1% lidocaine with  epinephrine.  After adequate hemostasis and anesthesia had taken effect,  the procedure was begun.   Both of the breast reductions were performed following similar manner.  The nipple-areolar complex was marked 45 cm nipple marker.  This was  then incised and the skin was de-epithelialized around the nipple-  areolar complex down to the  inframammary crease in the inferior pedicle  pattern.  Next the medial, superior and lateral skin flaps were elevated  down to the chest wall.  The excess fat and glandular tissue removed  from the inferior pedicle.  The nipple-areolar complex was examined and  found be pink and viable.  The wound was irrigated with saline  irrigation.  Meticulous hemostasis was obtained with the Bovie  electrocautery.  The inferior pedicle was centralized using 3-0 Prolene  suture.  The skin flaps brought together at the inverted T junction with  a 2-0 Prolene suture.  The incision then stapled for temporary closure.   The breasts were then compared and found to have good shape and  symmetry.  The [facility] incision was closed by removing some of the staples  and then loosely tacking the incisions with the medial aspect of the JP  drain that had been inserted prior to closure of the skin flaps to the  medial aspect of the [facility] incision with a few 3-0 Monocryl sutures in the  dermal layer.  Both the dermal cuticular layer were then closed in a  single closure using the Quill 2-0 PDO barbed suture.  Lateral to the JP  drain the incision was closed using 3-0 Monocryl in the dermal layer  followed by 3-0 Monocryl running intracuticular stitch on the skin.  The  vertical limb of Wise pattern incision was closed using 3-0 Monocryl  suture in the dermal layer.  All of the staples were removed.  The  patient was then placed the upright position.  The future location of  the nipple-areolar complexes was marked on the breast mounds using 45 mm  nipple marker.  This was then incised and the skin was excised full  thickness into the subcutaneous tissues.  The nipple-areolar complex was  examined and found be pink and viable.  The nipple-areolar complex was  then sutured into the aperture and sewn in place using 4-0 Monocryl in  the dermal layer followed by 5-0 Monocryl running intracuticular stitch  on the skin.  This 5-0  Monocryl suture was then carried down to close  the cuticular layer of the vertical limb.  The JP drains were sewn in  place using 3-0 nylon suture.  The incisions were dressed with Benzoin  and Steri-Strips.  The nipples dressed bacitracin ointment and Adaptic.  4x4s placed over the incisions and ABD pads in axillary areas.  The  patient was then placed into a light postoperative support bra.  There  no complications.  The patient tolerated procedure well.  The final  needle and sponge counts were reported to be correct at the end of the  procedure.   The patient was then extubated taken to recovery room in stable  condition.  She will remain overnight for progressive pain control along  with ambulation monitoring of the nipples and breast flaps.  Since she  is a diabetic we will also monitor her blood sugar levels.  Discharge is  planned for the morning.           ______________________________  Brantley Persons, M.D.     MC/MEDQ  D:  10/15/2007  T:  10/15/2007  Job:  [id number]

## 2011-09-27 LAB — I-STAT 8, (EC8 V) (CONVERTED LAB)
BUN: 5 — ABNORMAL LOW
Bicarbonate: 23.8
Glucose, Bld: 183 — ABNORMAL HIGH
pCO2, Ven: 36.4 — ABNORMAL LOW

## 2012-05-10 ENCOUNTER — Encounter (HOSPITAL_COMMUNITY): Payer: Self-pay | Admitting: Emergency Medicine

## 2012-05-10 ENCOUNTER — Emergency Department (INDEPENDENT_AMBULATORY_CARE_PROVIDER_SITE_OTHER)
Admission: EM | Admit: 2012-05-10 | Discharge: 2012-05-10 | Disposition: A | Discharge status: Home / Self Care | Payer: Self-pay | Source: Home / Self Care | Attending: Emergency Medicine | Admitting: Emergency Medicine

## 2012-05-10 DIAGNOSIS — K089 Disorder of teeth and supporting structures, unspecified: Secondary | ICD-10-CM

## 2012-05-10 DIAGNOSIS — H9201 Otalgia, right ear: Secondary | ICD-10-CM

## 2012-05-10 DIAGNOSIS — K0889 Other specified disorders of teeth and supporting structures: Secondary | ICD-10-CM

## 2012-05-10 DIAGNOSIS — H9209 Otalgia, unspecified ear: Secondary | ICD-10-CM

## 2012-05-10 LAB — GLUCOSE, CAPILLARY: Glucose-Capillary: 205 mg/dL — ABNORMAL HIGH (ref 70–99)

## 2012-05-10 MED ORDER — HYDROCODONE-ACETAMINOPHEN 5-500 MG PO TABS: 1.0000 | ORAL_TABLET | Freq: Four times a day (QID) | ORAL | Status: AC | PRN

## 2012-05-10 NOTE — ED Notes (Signed)
Pt having ear pain for 4 weeks. She states it started off as a toothache, but has since gotten worse. The tooth still hurts, but not as often. The pain is a 10/10 and all OTC meds, heat, oils, have not helped. Pt also has not taken her lantus or diabetic pills in months due to loss of job and insurance.

## 2012-05-10 NOTE — ED Provider Notes (Signed)
History     CSN: [id number]  Arrival date & time 05/10/12  0909   First MD Initiated Contact with Patient 05/10/12 [phone]      Chief Complaint  Patient presents with  . Otalgia    (Consider location/radiation/quality/duration/timing/severity/associated sxs/prior treatment) HPI Comments: Patient presents urgent care with ongoing dominantly right ear pain for about 3-4 weeks although has had some intermittent left-sided ear pain as well. Patient denies any trauma, drainage, fevers, or hearing changes. Also denies any unusual sounds such as tinnitus, no vertigo. Patient also describes that she intermittently has felt discomfort in the preauricular region and right temporomandibular joint area. Describes that last week she was also experiencing some dental sporadic dental pains on the right lower molar region. At this point during her evaluation today she's only describing that her right ear continues to bother her, but no dental pain or any other symptoms along with that at this point.  Patient is a 41 y.o. female presenting with ear pain. The history is provided by the patient.  Otalgia This is a new problem. The current episode started more than 1 week ago. There is pain in the right ear. The problem occurs constantly. The problem has not changed since onset.There has been no fever. Pertinent negatives include no ear discharge, no headaches, no hearing loss, no rhinorrhea, no sore throat and no diarrhea.    Past Medical History  Diagnosis Date  . Diabetes mellitus     History reviewed. No pertinent past surgical history.  History reviewed. No pertinent family history.  History  Substance Use Topics  . Smoking status: Never Smoker   . Smokeless tobacco: Not on file  . Alcohol Use: No    OB History    Grav Para Term Preterm Abortions TAB SAB Ect Mult Living                  Review of Systems  Constitutional: Negative for activity change and appetite change.  HENT: Positive  for ear pain. Negative for hearing loss, congestion, sore throat, rhinorrhea, sneezing, tinnitus and ear discharge.   Eyes: Negative for redness.  Gastrointestinal: Negative for diarrhea.  Neurological: Negative for dizziness, tremors, weakness and headaches.    Allergies  Review of patient's allergies indicates no known allergies.  Home Medications   Current Outpatient Rx  Name Route Sig Dispense Refill  . HYDROCODONE-ACETAMINOPHEN 5-500 MG PO TABS Oral Take 1-2 tablets by mouth every 6 (six) hours as needed for pain. 15 tablet 0    BP 152/87  Pulse 74  Temp(Src) 97.9 F (36.6 C) (Oral)  Resp 20  SpO2 97%  LMP 05/09/2012  Physical Exam  Nursing note and vitals reviewed. Constitutional: She appears well-developed and well-nourished.  Non-toxic appearance. She does not have a sickly appearance.  HENT:  Head: Normocephalic.  Right Ear: Hearing, tympanic membrane, external ear and ear canal normal. Tympanic membrane is not injected.  Left Ear: Hearing, tympanic membrane and external ear normal. Tympanic membrane is not injected.  Mouth/Throat: No oropharyngeal exudate.  Eyes: Conjunctivae and EOM are normal. Right eye exhibits no discharge. Left eye exhibits no discharge.  Neck: Neck supple. No JVD present.  Musculoskeletal: Normal range of motion.  Lymphadenopathy:    She has no cervical adenopathy.  Skin: No rash noted.    ED Course  Procedures (including critical care time)  Labs Reviewed  GLUCOSE, CAPILLARY - Abnormal; Notable for the following:    Glucose-Capillary 205 (*)    All other components  within normal limits   No results found.   1. Otalgia of right ear   2. Toothache       MDM  Right otalgia and intermittent dental and right-sided temporomandibular joint discomfort. Patient with an unremarkable exam. No signs, of localized infection either dental or ear canal or eardrum. Patient had been expressing this pain for about 4 weeks no further  symptomatology. I have not been able to establish the source of this discomfort he encouraged patient to followup with her primary care Dr. or the ENT Dr. for further evaluation her pain was to persist. Patient agree treatment plan and followup care as discussed instructed to       Jimmie Molly, MD 05/10/12 1040

## 2012-05-10 NOTE — Discharge Instructions (Signed)
    During your exam today was not apparent with could be the reason you're having this discomfort. I have recommended that you see an ENT Dr. as has been going on for approximately 4 weeks no signs of infection of your ear canal or your eardrum. There is no obvious signs of a dental infection at this point either.    Dental Pain Toothache is pain in or around a tooth. It may get worse with chewing or with cold or heat.  HOME CARE  Your dentist may use a numbing medicine during treatment. If so, you may need to avoid eating until the medicine wears off. Ask your dentist about this.   Only take medicine as told by your dentist or doctor.   Avoid chewing food near the painful tooth until after all treatment is done. Ask your dentist about this.  GET HELP RIGHT AWAY IF:   The problem gets worse or new problems appear.   You have a fever.   There is redness and puffiness (swelling) of the face, jaw, or neck.   You cannot open your mouth.   There is pain in the jaw.   There is very bad pain that is not helped by medicine.  MAKE SURE YOU:   Understand these instructions.   Will watch your condition.   Will get help right away if you are not doing well or get worse.  Document Released: 05/22/2008 Document Revised: 11/23/2011 Document Reviewed: 05/22/2008 [facility] Patient Information 2012 [place], [place].

## 2013-08-15 ENCOUNTER — Encounter (HOSPITAL_COMMUNITY): Payer: Self-pay | Admitting: Emergency Medicine

## 2013-08-15 ENCOUNTER — Emergency Department (INDEPENDENT_AMBULATORY_CARE_PROVIDER_SITE_OTHER)
Admission: EM | Admit: 2013-08-15 | Discharge: 2013-08-15 | Disposition: A | Discharge status: Home / Self Care | Payer: PRIVATE HEALTH INSURANCE | Source: Home / Self Care

## 2013-08-15 ENCOUNTER — Emergency Department (INDEPENDENT_AMBULATORY_CARE_PROVIDER_SITE_OTHER): Payer: PRIVATE HEALTH INSURANCE

## 2013-08-15 DIAGNOSIS — G5600 Carpal tunnel syndrome, unspecified upper limb: Secondary | ICD-10-CM

## 2013-08-15 DIAGNOSIS — G5601 Carpal tunnel syndrome, right upper limb: Secondary | ICD-10-CM

## 2013-08-15 MED ORDER — PREDNISONE 10 MG PO KIT: PACK | ORAL | Status: DC

## 2013-08-15 MED ORDER — TRAMADOL HCL 50 MG PO TABS: 50.0000 mg | ORAL_TABLET | Freq: Four times a day (QID) | ORAL | Status: DC | PRN

## 2013-08-15 NOTE — ED Notes (Signed)
Tim, emt applying splint

## 2013-08-15 NOTE — ED Notes (Signed)
Hand pain, right hand.  Patient remebers feeling/hearing pop when lifting a box one week ago, but no pain.  That evening started having pain in hand, wrist.

## 2013-08-15 NOTE — ED Provider Notes (Signed)
Katrina Montes is a 42 y.o. female who presents to Urgent Care today for right wrist pain. Patient was lifting a heavy object 9 days ago when she felt a pop in her hand and wrist. The pain worsened over the last several days. She has pain with clenching her fist and extending her fingers fully. She notes some tingling sensation in her distal hand as well. She denies any falls or significant wrist pain. She denies any numbness or weakness. No nausea vomiting diarrhea fevers or chill. Over-the-counter medications have not been very helpful. Pain is moderate to severe with activity.    PMH reviewed. Diabetes History  Substance Use Topics  . Smoking status: Never Smoker   . Smokeless tobacco: Not on file  . Alcohol Use: No   ROS as above Medications reviewed. No current facility-administered medications for this encounter.   Current Outpatient Prescriptions  Medication Sig Dispense Refill  . PredniSONE 10 MG KIT 12 day dose pack po  1 kit  0  . traMADol (ULTRAM) 50 MG tablet Take 1 tablet (50 mg total) by mouth every 6 (six) hours as needed for pain.  20 tablet  0    Exam:  BP 158/100  Pulse 86  Temp(Src) 98.5 F (36.9 C) (Oral)  Resp 19  SpO2 98%  LMP 08/07/2013 Gen: Well NAD RIGHT WRIST: Normal-appearing. Minimally tender at the palmar base of the thumb.  Full wrist motion and hand motion however pain with maximal flexion and extension of the fingers. Capillary refill sensation intact distal Intact strength to flexion at all phalanges. Opposition abduction and adduction of the thumb is intact Sensation is intact distally as is capillary refill  No results found for this or any previous visit (from the past 24 hour(s)). Dg Wrist Complete Right  08/15/2013   CLINICAL DATA:  Fall with right wrist pain. Injury.  EXAM: RIGHT WRIST - COMPLETE 3+ VIEW  COMPARISON:  None.  FINDINGS: There is no evidence of fracture or dislocation. There is no evidence of arthropathy or other focal bone  abnormality. Soft tissues are unremarkable.  IMPRESSION: Negative   Electronically Signed   By: Charlett Nose   On: 08/15/2013 20:00   Musculoskeletal ultrasound right wrist:  Carpal tunnel visualize. 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 The total volume is 0.25 m which is more than twice normal.  Structures are otherwise normal and intact in the palmar wrist.  The dorsal first second and third compartments are normal appearing The fourth compartment has significant fluid in the tendon sheath.  The fifth and sixth compartments are normal.  Bony structures are intact.  Flexor tendons are intact at the MCP to distal in all 4 fingers.    Assessment and Plan: 42 y.o. female with carpal tunnel syndrome with tenosynovitis of the wrist extensors.  Unclear cause of the initial injury.  Plan to treat with prednisone dose pack, and wrist brace.  Additionally light duty for 7 days Followup with Dr. Katrinka Blazing or Dr. Farris Has in 2 weeks if not improved. Discussed warning signs or symptoms. Please see discharge instructions. Patient expresses understanding.      Rodolph Bong, MD 08/15/13 [phone]

## 2014-06-26 ENCOUNTER — Encounter: Payer: Self-pay | Admitting: *Deleted

## 2014-07-28 ENCOUNTER — Encounter: Payer: Self-pay | Admitting: Family Medicine

## 2014-07-28 ENCOUNTER — Ambulatory Visit (INDEPENDENT_AMBULATORY_CARE_PROVIDER_SITE_OTHER): Payer: 59 | Admitting: Family Medicine

## 2014-07-28 VITALS — BP 170/98 | HR 78 | Temp 98.4°F | Resp 14 | Ht 66.0 in | Wt 303.0 lb

## 2014-07-28 DIAGNOSIS — I10 Essential (primary) hypertension: Secondary | ICD-10-CM

## 2014-07-28 DIAGNOSIS — E119 Type 2 diabetes mellitus without complications: Secondary | ICD-10-CM

## 2014-07-28 DIAGNOSIS — E559 Vitamin D deficiency, unspecified: Secondary | ICD-10-CM

## 2014-07-28 DIAGNOSIS — N926 Irregular menstruation, unspecified: Secondary | ICD-10-CM

## 2014-07-28 DIAGNOSIS — E669 Obesity, unspecified: Secondary | ICD-10-CM

## 2014-07-28 DIAGNOSIS — Z1231 Encounter for screening mammogram for malignant neoplasm of breast: Secondary | ICD-10-CM

## 2014-07-28 DIAGNOSIS — Z0000 Encounter for general adult medical examination without abnormal findings: Secondary | ICD-10-CM

## 2014-07-28 LAB — COMPREHENSIVE METABOLIC PANEL
ALK PHOS: 70 U/L (ref 39–117)
ALT: 37 U/L — AB (ref 0–35)
AST: 35 U/L (ref 0–37)
Albumin: 3.9 g/dL (ref 3.5–5.2)
BILIRUBIN TOTAL: 0.6 mg/dL (ref 0.2–1.2)
BUN: 7 mg/dL (ref 6–23)
CO2: 26 meq/L (ref 19–32)
CREATININE: 0.58 mg/dL (ref 0.50–1.10)
Calcium: 8.7 mg/dL (ref 8.4–10.5)
Chloride: 100 mEq/L (ref 96–112)
Glucose, Bld: 269 mg/dL — ABNORMAL HIGH (ref 70–99)
Potassium: 4.1 mEq/L (ref 3.5–5.3)
SODIUM: 135 meq/L (ref 135–145)
Total Protein: 7.7 g/dL (ref 6.0–8.3)

## 2014-07-28 LAB — HEMOGLOBIN A1C
Hgb A1c MFr Bld: 11.7 % — ABNORMAL HIGH (ref <5.7)
Mean Plasma Glucose: 289 mg/dL — ABNORMAL HIGH (ref <117)

## 2014-07-28 LAB — CBC WITH DIFFERENTIAL/PLATELET
Basophils Absolute: 0 10*3/uL (ref 0.0–0.1)
Basophils Relative: 0 % (ref 0–1)
Eosinophils Absolute: 0.1 10*3/uL (ref 0.0–0.7)
Eosinophils Relative: 1 % (ref 0–5)
HCT: 35.4 % — ABNORMAL LOW (ref 36.0–46.0)
Hemoglobin: 11.2 g/dL — ABNORMAL LOW (ref 12.0–15.0)
Lymphocytes Relative: 40 % (ref 12–46)
Lymphs Abs: 2.3 10*3/uL (ref 0.7–4.0)
MCH: 24.7 pg — ABNORMAL LOW (ref 26.0–34.0)
MCHC: 31.6 g/dL (ref 30.0–36.0)
MCV: 78 fL (ref 78.0–100.0)
Monocytes Absolute: 0.5 10*3/uL (ref 0.1–1.0)
Monocytes Relative: 8 % (ref 3–12)
Neutro Abs: 2.9 10*3/uL (ref 1.7–7.7)
Neutrophils Relative %: 51 % (ref 43–77)
Platelets: 343 10*3/uL (ref 150–400)
RBC: 4.54 MIL/uL (ref 3.87–5.11)
RDW: 16.5 % — ABNORMAL HIGH (ref 11.5–15.5)
WBC: 5.7 10*3/uL (ref 4.0–10.5)

## 2014-07-28 LAB — TSH: TSH: 1.281 u[IU]/mL (ref 0.350–4.500)

## 2014-07-28 LAB — LIPID PANEL
Cholesterol: 197 mg/dL (ref 0–200)
HDL: 52 mg/dL (ref >39)
LDL Cholesterol: 119 mg/dL — ABNORMAL HIGH (ref 0–99)
Total CHOL/HDL Ratio: 3.8 Ratio
Triglycerides: 128 mg/dL (ref <150)
VLDL: 26 mg/dL (ref 0–40)

## 2014-07-28 LAB — MICROALBUMIN / CREATININE URINE RATIO
Creatinine, Urine: 223.8 mg/dL
Microalb Creat Ratio: 3.5 mg/g (ref 0.0–30.0)
Microalb, Ur: 0.78 mg/dL (ref 0.00–1.89)

## 2014-07-28 MED ORDER — LISINOPRIL-HYDROCHLOROTHIAZIDE 10-12.5 MG PO TABS: 1.0000 | ORAL_TABLET | Freq: Every day | ORAL | Status: DC

## 2014-07-28 NOTE — Progress Notes (Signed)
Patient ID: Katrina Montes, female   DOB: August 10, 1971, 43 y.o.   MRN: [id number]   Subjective:    Patient ID: Katrina Montes, female    DOB: Apr 19, 1971, 43 y.o.   MRN: [id number]  Patient presents for CPE- no PAP- on menses and Edema  Patient here to establish care. She's not been seen by PCP in about 2 years. She did see Dr. Renae Gloss for a while then she lost her insurance and was at [employer]. She has history of hypertension diabetes mellitus and history of dysfunctional uterine bleeding and ovarian cyst. She's not been on any medications for greater than 2 years but states that she does check her blood sugar and has been running good. She was recently at her ophthalmologist [facility] and they told her that she had signs of damage to her eyes for her blood pressure. Her blood pressure was elevated at that visit as well.  She's currently on her menstrual cycle she typically bleeds almost 3 weeks at least out of the month. She did have endometrial biopsy by GYN in the past I do not have results of this she's also had ultrasound which showed endometrial thickening. This is been constant for at least the past 3-4 years. She wants to followup next week when she typically has lighter spotting to have her Pap smear done. She's also due for mammogram she does have a family history in her mother who had breast cancer.  She does complain of some leg swelling more recently but denies chest pain or shortness of breath. She has also lost 12 pounds intentionally trying to get her weight down after going to the eye doctor noting that her blood pressure was so high.   Review Of Systems:  GEN- denies fatigue, fever, weight loss,weakness, recent illness HEENT- denies eye drainage, change in vision, nasal discharge, CVS- denies chest pain, palpitations RESP- denies SOB, cough, wheeze ABD- denies N/V, change in stools, abd pain GU- denies dysuria, hematuria, dribbling, incontinence MSK- denies joint  pain, muscle aches, injury Neuro- denies headache, dizziness, syncope, seizure activity       Objective:    BP 170/98  Pulse 78  Temp(Src) 98.4 F (36.9 C) (Oral)  Resp 14  Ht 5\' 6"  (1.676 m)  Wt 303 lb (137.44 kg)  BMI 48.93 kg/m2  LMP 07/22/2014 GEN- NAD, alert and oriented x3,obese HEENT- PERRL, EOMI, non injected sclera, pink conjunctiva, MMM, oropharynx clear Neck- Supple, no thyromegaly CVS- RRR, no murmur RESP-CTAB ABD-NABS,soft,NT,ND EXT- pedal edema Pulses- Radial, DP- 2+        Assessment & Plan:      Problem List Items Addressed This Visit   VITAMIN D DEFICIENCY   Relevant Orders      Vitamin D, 25-hydroxy   Routine general medical examination at a health care facility - Primary   OBESITY   IRREGULAR MENSES   HYPERTENSION   Relevant Medications      lisinopril-hydrochlorothiazide (PRINZIDE,ZESTORETIC) 10-12.5 MG per tablet   Other Relevant Orders      TSH      CBC with Differential      Comprehensive metabolic panel      Lipid panel   DIABETES MELLITUS, TYPE II   Relevant Medications      lisinopril-hydrochlorothiazide (PRINZIDE,ZESTORETIC) 10-12.5 MG per tablet   Other Relevant Orders      HM DIABETES FOOT EXAM (Completed)      Microalbumin / creatinine urine ratio      Hemoglobin A1c  Lipid panel    Other Visit Diagnoses   Other screening mammogram        Relevant Orders       MM DIGITAL SCREENING BILATERAL       Note: This dictation was prepared with Dragon dictation along with smaller phrase technology. Any transcriptional errors that result from this process are unintentional.

## 2014-07-28 NOTE — Assessment & Plan Note (Signed)
I will obtain a GYN records where she had workup for this. We will get her blood pressure better controlled her weight down we could consider using hormone therapy to see if this will help.

## 2014-07-28 NOTE — Assessment & Plan Note (Signed)
Start lisinopril HCTZ 10/12.5 mg this also helps some of the pedal edema

## 2014-07-28 NOTE — Assessment & Plan Note (Signed)
PAP next week Mammogram scheduled Fasting labs TDAP 5 years ago, she thinks she had Pneumonia vaccine, will check records

## 2014-07-28 NOTE — Patient Instructions (Addendum)
Release of records-- Dr. Renae Gloss- [facility] Dr. Arelia Sneddon- OB/GYN Start the new blood pressure medication once a day in the morning F/U 1 week for PAP Smear

## 2014-07-28 NOTE — Assessment & Plan Note (Signed)
Goal based on her age as her A1c closer to 6.5%. She will be started on ACE inhibitor today. I will wait and see her labs to see she needs metformin or statin drug therapy Loss of followup for pneumonia vaccine

## 2014-07-28 NOTE — Assessment & Plan Note (Signed)
Continue to work on diet and exercise

## 2014-07-29 LAB — VITAMIN D 25 HYDROXY (VIT D DEFICIENCY, FRACTURES): Vit D, 25-Hydroxy: 15 ng/mL — ABNORMAL LOW (ref 30–89)

## 2014-07-31 ENCOUNTER — Other Ambulatory Visit: Payer: Self-pay | Admitting: *Deleted

## 2014-07-31 MED ORDER — GLUCOSE BLOOD VI STRP: ORAL_STRIP | Status: DC

## 2014-07-31 MED ORDER — BLOOD GLUCOSE METER KIT: PACK | Status: DC

## 2014-07-31 MED ORDER — METFORMIN HCL 500 MG PO TABS: 500.0000 mg | ORAL_TABLET | Freq: Two times a day (BID) | ORAL | Status: DC

## 2014-07-31 MED ORDER — ACCU-CHEK SOFTCLIX LANCET DEV MISC: Status: DC

## 2014-07-31 NOTE — Telephone Encounter (Signed)
Received fax requesting refill on test strips and lancets.   Refill appropriate and filled per protocol.

## 2014-08-03 ENCOUNTER — Telehealth: Payer: Self-pay | Admitting: Family Medicine

## 2014-08-03 NOTE — Telephone Encounter (Signed)
Patient left message on voicemail about her meter and other things message was very muffled sorry but i could not understand  [phone]

## 2014-08-03 NOTE — Telephone Encounter (Signed)
Patient had questions about lancets and test strips.   Advised [facility] and should be available at this time for pick-up.

## 2014-08-04 ENCOUNTER — Ambulatory Visit (INDEPENDENT_AMBULATORY_CARE_PROVIDER_SITE_OTHER): Payer: 59 | Admitting: Family Medicine

## 2014-08-04 ENCOUNTER — Other Ambulatory Visit: Payer: Self-pay | Admitting: Family Medicine

## 2014-08-04 ENCOUNTER — Encounter: Payer: Self-pay | Admitting: Family Medicine

## 2014-08-04 VITALS — BP 136/74 | HR 78 | Temp 98.5°F | Resp 14 | Ht 66.0 in | Wt 301.0 lb

## 2014-08-04 DIAGNOSIS — Z23 Encounter for immunization: Secondary | ICD-10-CM

## 2014-08-04 DIAGNOSIS — E559 Vitamin D deficiency, unspecified: Secondary | ICD-10-CM

## 2014-08-04 DIAGNOSIS — A5901 Trichomonal vulvovaginitis: Secondary | ICD-10-CM

## 2014-08-04 DIAGNOSIS — Z124 Encounter for screening for malignant neoplasm of cervix: Secondary | ICD-10-CM | POA: Insufficient documentation

## 2014-08-04 DIAGNOSIS — E119 Type 2 diabetes mellitus without complications: Secondary | ICD-10-CM

## 2014-08-04 DIAGNOSIS — N898 Other specified noninflammatory disorders of vagina: Secondary | ICD-10-CM

## 2014-08-04 LAB — WET PREP FOR TRICH, YEAST, CLUE: YEAST WET PREP: NONE SEEN

## 2014-08-04 MED ORDER — METFORMIN HCL 500 MG PO TABS: 1000.0000 mg | ORAL_TABLET | Freq: Two times a day (BID) | ORAL | Status: DC

## 2014-08-04 MED ORDER — VITAMIN D (ERGOCALCIFEROL) 1.25 MG (50000 UNIT) PO CAPS: 50000.0000 [IU] | ORAL_CAPSULE | ORAL | Status: DC

## 2014-08-04 NOTE — Patient Instructions (Addendum)
Increase Metformin to 2 tablets ( 1000mg  ) twice a day Continue blood pressure medication Pneumonia shot given Mammogram to be done Take vitamin D once a day  F/U in 4 weeks, bring your log meter

## 2014-08-04 NOTE — Progress Notes (Signed)
Patient ID: Katrina KirschnerAngele Montes, female   DOB: 1971-11-18, 43 y.o.   MRN: [id number]   Subjective:    Patient ID: Katrina KirschnerAngele Montes, female    DOB: 1971-11-18, 43 y.o.   MRN: [id number]  Patient presents for CPE with PAP  Patient here for a Pap smear she was unable to do this at her physical exam. We also reviewed her fasting labs which showed a severely elevated A1c. She was started on metformin 500 mg twice a day which she's been taking for the past 4 days. She does not have test strips therefore she has not been checked her blood sugar yet. She's also due for Pneumovax 23.   Review Of Systems:  GEN- denies fatigue, fever, weight loss,weakness, recent illness HEENT- denies eye drainage, change in vision, nasal discharge, CVS- denies chest pain, palpitations RESP- denies SOB, cough, wheeze ABD- denies N/V, change in stools, abd pain GU- denies dysuria, hematuria, dribbling, incontinence MSK- denies joint pain, muscle aches, injury Neuro- denies headache, dizziness, syncope, seizure activity       Objective:    BP 136/74  Pulse 78  Temp(Src) 98.5 F (36.9 C) (Oral)  Resp 14  Ht 5\' 6"  (1.676 m)  Wt 301 lb (136.533 kg)  BMI 48.61 kg/m2  LMP 07/22/2014 GEN- NAD, alert and oriented, Neck- supple, no thyromegaly Breast- normal symmetry, no nipple inversion,no nipple drainage, no nodules or lumps felt Nodes- no axillary nodes GU- normal external genitalia, vaginal mucosa pink and moist, cervix visualized no growth, no blood form os, + yellow discharge, no CMT, no ovarian masses, uterus normal size        Assessment & Plan:      Problem List Items Addressed This Visit   None      Note: This dictation was prepared with Dragon dictation along with smaller phrase technology. Any transcriptional errors that result from this process are unintentional.

## 2014-08-05 LAB — GC/CHLAMYDIA PROBE AMP
CT PROBE, AMP APTIMA: NEGATIVE
GC Probe RNA: NEGATIVE

## 2014-08-05 LAB — PAP THINPREP ASCUS RFLX HPV RFLX TYPE

## 2014-08-06 ENCOUNTER — Other Ambulatory Visit: Payer: Self-pay | Admitting: *Deleted

## 2014-08-06 DIAGNOSIS — A5901 Trichomonal vulvovaginitis: Secondary | ICD-10-CM | POA: Insufficient documentation

## 2014-08-06 MED ORDER — METRONIDAZOLE 500 MG PO TABS: 500.0000 mg | ORAL_TABLET | Freq: Two times a day (BID) | ORAL | Status: DC

## 2014-08-06 NOTE — Assessment & Plan Note (Signed)
Seen on wet prep, GC/chlamdyia neg Treat with flagyl

## 2014-08-06 NOTE — Assessment & Plan Note (Signed)
Uncontrolled, maximize metformin, return in a few weeks with readings will continue to add meds until fasting controlled

## 2014-08-06 NOTE — Assessment & Plan Note (Signed)
Treat with Ergocalciferol for 12 weeks

## 2014-08-13 ENCOUNTER — Encounter: Payer: Self-pay | Admitting: *Deleted

## 2014-09-01 ENCOUNTER — Encounter: Payer: Self-pay | Admitting: Family Medicine

## 2014-09-01 ENCOUNTER — Ambulatory Visit (INDEPENDENT_AMBULATORY_CARE_PROVIDER_SITE_OTHER): Payer: 59 | Admitting: Family Medicine

## 2014-09-01 VITALS — BP 138/82 | HR 64 | Temp 98.4°F | Resp 12 | Ht 66.0 in | Wt 306.0 lb

## 2014-09-01 DIAGNOSIS — K529 Noninfective gastroenteritis and colitis, unspecified: Secondary | ICD-10-CM

## 2014-09-01 DIAGNOSIS — E119 Type 2 diabetes mellitus without complications: Secondary | ICD-10-CM

## 2014-09-01 DIAGNOSIS — Z23 Encounter for immunization: Secondary | ICD-10-CM

## 2014-09-01 DIAGNOSIS — K5289 Other specified noninfective gastroenteritis and colitis: Secondary | ICD-10-CM

## 2014-09-01 DIAGNOSIS — I10 Essential (primary) hypertension: Secondary | ICD-10-CM

## 2014-09-01 NOTE — Progress Notes (Signed)
Patient ID: Katrina Montes, female   DOB: 1971-03-05, 43 y.o.   MRN: [id number]   Subjective:    Patient ID: Katrina Montes, female    DOB: [birth year], 43 y.o.   MRN: [id number]  Patient presents for 4 week F/U and Illness  patient here to followup diabetes mellitus, her last A1c returned at 11.7% she was started on metformin she is now 1000 mg twice a day and here for interim visit. Her 7 day average is 207 her 30 day average 196. She's been trying to monitor her diet to make some changes with regards to her, hydrate and sugar. She does state that she had a recent stomach  Bug she returned from a conference recently,she no longer has any diarrhea but still feels achy all over.    Review Of Systems:  GEN- +fatigue, fever, weight loss,weakness, recent illness HEENT- denies eye drainage, change in vision, nasal discharge, CVS- denies chest pain, palpitations RESP- denies SOB, cough, wheeze ABD- denies N/V, change in stools, abd pain Neuro- denies headache, dizziness, syncope, seizure activity       Objective:    BP 138/82  Pulse 64  Temp(Src) 98.4 F (36.9 C) (Oral)  Resp 12  Ht 5\' 6"  (1.676 m)  Wt 306 lb (138.801 kg)  BMI 49.41 kg/m2  LMP 08/04/2014 GEN- NAD, alert and oriented x3 HEENT- PERRL, EOMI, non injected sclera, pink conjunctiva, MMM, oropharynx clear CVS- RRR, no murmur RESP-CTAB ABD-NABS,soft,NT,ND EXT- No edema Pulses- Radiall 2+        Assessment & Plan:      Problem List Items Addressed This Visit   None    Visit Diagnoses   Need for prophylactic vaccination and inoculation against influenza    -  Primary    Relevant Orders       Flu Vaccine QUAD 36+ mos PF IM (Fluarix Quad PF) (Completed)       Note: This dictation was prepared with Dragon dictation along with smaller phrase technology. Any transcriptional errors that result from this process are unintentional.

## 2014-09-01 NOTE — Assessment & Plan Note (Signed)
Viral illness,resolving

## 2014-09-01 NOTE — Assessment & Plan Note (Signed)
Uncontrolled diabetes mellitus. I will start Invokana 100mg  in addition to her metformin. She started on an ACE inhibitor also need a statin drug in the near future. We discussed the importance of dietary changes and keep and I will blood sugars on a regular basis

## 2014-09-01 NOTE — Assessment & Plan Note (Signed)
Blood pressure improved today we'll continue current dose of medication

## 2014-09-01 NOTE — Patient Instructions (Addendum)
Flu shot given Work on diet and weight loss Start invokana 1 tablet daily  Continue metformin as prescribed  F/U 2 months

## 2014-09-18 ENCOUNTER — Telehealth: Payer: Self-pay | Admitting: *Deleted

## 2014-09-18 MED ORDER — CANAGLIFLOZIN-METFORMIN HCL 50-1000 MG PO TABS: 1.0000 | ORAL_TABLET | Freq: Two times a day (BID) | ORAL | Status: DC

## 2014-09-18 NOTE — Telephone Encounter (Signed)
Received call from patient stating that she is needing a refill on Metformin but stated that provider told her that when needing refill to call and let know and could try putting on a combination of Metformin and Invokana(?). Do you want to do combo or separate medication? Please advise  [facility]

## 2014-09-18 NOTE — Telephone Encounter (Signed)
Invokamet sent

## 2014-10-16 ENCOUNTER — Ambulatory Visit
Admission: RE | Admit: 2014-10-16 | Discharge: 2014-10-16 | Disposition: A | Discharge status: Home / Self Care | Payer: 59 | Source: Ambulatory Visit | Attending: Family Medicine | Admitting: Family Medicine

## 2014-10-16 DIAGNOSIS — Z1231 Encounter for screening mammogram for malignant neoplasm of breast: Secondary | ICD-10-CM

## 2014-11-02 ENCOUNTER — Ambulatory Visit (INDEPENDENT_AMBULATORY_CARE_PROVIDER_SITE_OTHER): Payer: 59 | Admitting: Family Medicine

## 2014-11-02 ENCOUNTER — Encounter: Payer: Self-pay | Admitting: Family Medicine

## 2014-11-02 VITALS — BP 136/70 | HR 82 | Temp 98.4°F | Resp 16 | Ht 66.0 in | Wt 301.0 lb

## 2014-11-02 DIAGNOSIS — I10 Essential (primary) hypertension: Secondary | ICD-10-CM

## 2014-11-02 DIAGNOSIS — E1165 Type 2 diabetes mellitus with hyperglycemia: Secondary | ICD-10-CM

## 2014-11-02 DIAGNOSIS — M7072 Other bursitis of hip, left hip: Secondary | ICD-10-CM

## 2014-11-02 DIAGNOSIS — N926 Irregular menstruation, unspecified: Secondary | ICD-10-CM

## 2014-11-02 DIAGNOSIS — IMO0002 Reserved for concepts with insufficient information to code with codable children: Secondary | ICD-10-CM

## 2014-11-02 DIAGNOSIS — M25519 Pain in unspecified shoulder: Secondary | ICD-10-CM | POA: Insufficient documentation

## 2014-11-02 DIAGNOSIS — M25512 Pain in left shoulder: Secondary | ICD-10-CM

## 2014-11-02 LAB — LIPID PANEL
CHOL/HDL RATIO: 3.2 ratio
CHOLESTEROL: 208 mg/dL — AB (ref 0–200)
HDL: 66 mg/dL (ref >39)
LDL Cholesterol: 119 mg/dL — ABNORMAL HIGH (ref 0–99)
TRIGLYCERIDES: 114 mg/dL (ref <150)
VLDL: 23 mg/dL (ref 0–40)

## 2014-11-02 LAB — CBC WITH DIFFERENTIAL/PLATELET
Basophils Absolute: 0 10*3/uL (ref 0.0–0.1)
Basophils Relative: 0 % (ref 0–1)
EOS ABS: 0.1 10*3/uL (ref 0.0–0.7)
EOS PCT: 1 % (ref 0–5)
HEMATOCRIT: 34.9 % — AB (ref 36.0–46.0)
HEMOGLOBIN: 11.1 g/dL — AB (ref 12.0–15.0)
LYMPHS ABS: 2.3 10*3/uL (ref 0.7–4.0)
Lymphocytes Relative: 27 % (ref 12–46)
MCH: 25.4 pg — AB (ref 26.0–34.0)
MCHC: 31.8 g/dL (ref 30.0–36.0)
MCV: 79.9 fL (ref 78.0–100.0)
MONOS PCT: 9 % (ref 3–12)
Monocytes Absolute: 0.8 10*3/uL (ref 0.1–1.0)
Neutro Abs: 5.4 10*3/uL (ref 1.7–7.7)
Neutrophils Relative %: 63 % (ref 43–77)
Platelets: 367 10*3/uL (ref 150–400)
RBC: 4.37 MIL/uL (ref 3.87–5.11)
RDW: 15.9 % — ABNORMAL HIGH (ref 11.5–15.5)
WBC: 8.6 10*3/uL (ref 4.0–10.5)

## 2014-11-02 LAB — COMPREHENSIVE METABOLIC PANEL
ALT: 22 U/L (ref 0–35)
AST: 15 U/L (ref 0–37)
Albumin: 4.1 g/dL (ref 3.5–5.2)
Alkaline Phosphatase: 79 U/L (ref 39–117)
BILIRUBIN TOTAL: 0.5 mg/dL (ref 0.2–1.2)
BUN: 9 mg/dL (ref 6–23)
CO2: 25 meq/L (ref 19–32)
CREATININE: 0.7 mg/dL (ref 0.50–1.10)
Calcium: 9.2 mg/dL (ref 8.4–10.5)
Chloride: 104 mEq/L (ref 96–112)
GLUCOSE: 208 mg/dL — AB (ref 70–99)
Potassium: 5.1 mEq/L (ref 3.5–5.3)
Sodium: 141 mEq/L (ref 135–145)
Total Protein: 7.8 g/dL (ref 6.0–8.3)

## 2014-11-02 LAB — HEMOGLOBIN A1C
Hgb A1c MFr Bld: 8.6 % — ABNORMAL HIGH (ref <5.7)
Mean Plasma Glucose: 200 mg/dL — ABNORMAL HIGH (ref <117)

## 2014-11-02 MED ORDER — DICLOFENAC SODIUM 75 MG PO TBEC: 75.0000 mg | DELAYED_RELEASE_TABLET | Freq: Two times a day (BID) | ORAL | Status: DC

## 2014-11-02 MED ORDER — NORGESTIMATE-ETH ESTRADIOL 0.25-35 MG-MCG PO TABS: 1.0000 | ORAL_TABLET | Freq: Every day | ORAL | Status: DC

## 2014-11-02 NOTE — Assessment & Plan Note (Signed)
Continues to have DUB, has had GYN biopsy in past, never on hormones Will try her on spintec once a day since, DM and BP improved,

## 2014-11-02 NOTE — Assessment & Plan Note (Signed)
Recheck A1C goal is < 7% on ACEI, continue invokamet

## 2014-11-02 NOTE — Assessment & Plan Note (Signed)
Well controlled 

## 2014-11-02 NOTE — Patient Instructions (Addendum)
Take vitamin D 1000IU over counter  We will call with lab results  Sprintec once a day - birth control  Diclofenac twice a day with food Do not take ibuprofen with the diclofenac F/U 3 months Bursitis Bursitis is a swelling and soreness (inflammation) of a fluid-filled sac (bursa) that overlies and protects a joint. It can be caused by injury, overuse of the joint, arthritis or infection. The joints most likely to be affected are the elbows, shoulders, hips and knees. HOME CARE INSTRUCTIONS   Apply ice to the affected area for 15-20 minutes each hour while awake for 2 days. Put the ice in a plastic bag and place a towel between the bag of ice and your skin.  Rest the injured joint as much as possible, but continue to put the joint through a full range of motion, 4 times per day. (The shoulder joint especially becomes rapidly "frozen" if not used.) When the pain lessens, begin normal slow movements and usual activities.  Only take over-the-counter or prescription medicines for pain, discomfort or fever as directed by your caregiver.  Your caregiver may recommend draining the bursa and injecting medicine into the bursa. This may help the healing process.  Follow all instructions for follow-up with your caregiver. This includes any orthopedic referrals, physical therapy and rehabilitation. Any delay in obtaining necessary care could result in a delay or failure of the bursitis to heal and chronic pain. SEEK IMMEDIATE MEDICAL CARE IF:   Your pain increases even during treatment.  You develop an oral temperature above 102 F (38.9 C) and have heat and inflammation over the involved bursa. MAKE SURE YOU:   Understand these instructions.  Will watch your condition.  Will get help right away if you are not doing well or get worse. Document Released: 12/01/2000 Document Revised: 02/26/2012 Document Reviewed: 02/23/2014 [employer] Patient Information 2015 [place], [place]. This information is not  intended to replace advice given to you by your health care provider. Make sure you discuss any questions you have with your health care provider.

## 2014-11-02 NOTE — Progress Notes (Signed)
Patient ID: Katrina KirschnerAngele Montes, female   DOB: 10/27/1971, 43 y.o.   MRN: [id number]   Subjective:    Patient ID: Katrina KirschnerAngele Montes, female    DOB: 10/27/1971, 43 y.o.   MRN: [id number]  Patient presents for 2 month F/U patient here for follow-up on her diabetes mellitus. Her fasting blood sugars 140s she's not had any hypoglycemia. She has had 5 pound weight loss with Invokamet combination.  She complains of left shoulder pain which is now past couple months she's also had left hip that radiates towards her knee on and off for about a year. She did follow a year ago does not have any significant injury. The pain wakes her up at nighttime and is worse at night. She doesn't the ibuprofen but this is mostly because of her prolonged. Denies paresthesia in hands or feet    Review Of Systems:  GEN- denies fatigue, fever, weight loss,weakness, recent illness HEENT- denies eye drainage, change in vision, nasal discharge, CVS- denies chest pain, palpitations RESP- denies SOB, cough, wheeze ABD- denies N/V, change in stools, abd pain GU- denies dysuria, hematuria, dribbling, incontinence MSK- + joint pain, muscle aches, injury Neuro- denies headache, dizziness, syncope, seizure activity       Objective:    BP 136/70 mmHg  Pulse 82  Temp(Src) 98.4 F (36.9 C) (Oral)  Resp 16  Ht 5\' 6"  (1.676 m)  Wt 301 lb (136.533 kg)  BMI 48.61 kg/m2 GEN- NAD, alert and oriented x3 HEENT- PERRL, EOMI, non injected sclera, pink conjunctiva, MMM, oropharynx clear Neck- Supple, FROM CVS- RRR, no murmur RESP-CTAB MSK- Bilat shoulder, normal appearance, Rotator cuff in tact, neg impigment, TTP over anterior  Deltoid, good ROM Spine NT, fair ROM bilat hips 2/2 habitus, TTP over greater trochanter region, bilat knee normal inspection, no effiusion, good ROM EXT- No edema Pulses- Radial 2+        Assessment & Plan:      Problem List Items Addressed This Visit    Shoulder pain   Essential hypertension   Diabetes mellitus type II, uncontrolled - Primary   Relevant Orders      CBC with Differential      Comprehensive metabolic panel      Lipid panel      Hemoglobin A1c   Bursitis of left hip      Note: This dictation was prepared with Dragon dictation along with smaller phrase technology. Any transcriptional errors that result from this process are unintentional.

## 2014-11-02 NOTE — Assessment & Plan Note (Signed)
Possible bursititis or strain, treat with NSAIDS diclofenac, hold on imaging

## 2014-11-02 NOTE — Assessment & Plan Note (Signed)
Would hold on steroids at this time due to DM Diclofenac, exercise, weight loss

## 2014-11-05 ENCOUNTER — Other Ambulatory Visit: Payer: Self-pay | Admitting: Family Medicine

## 2014-11-05 NOTE — Telephone Encounter (Signed)
Refill denied.   Patient to begin Vitamin D3 2000 IU OTC.

## 2014-12-14 ENCOUNTER — Other Ambulatory Visit: Payer: Self-pay | Admitting: Family Medicine

## 2014-12-15 ENCOUNTER — Other Ambulatory Visit: Payer: Self-pay | Admitting: Family Medicine

## 2014-12-15 NOTE — Telephone Encounter (Signed)
Medication refilled per protocol. 

## 2014-12-17 ENCOUNTER — Other Ambulatory Visit: Payer: Self-pay | Admitting: Family Medicine

## 2014-12-18 DIAGNOSIS — G35 Multiple sclerosis: Secondary | ICD-10-CM

## 2014-12-18 HISTORY — DX: Multiple sclerosis: G35

## 2015-01-01 ENCOUNTER — Emergency Department (HOSPITAL_COMMUNITY)
Admission: EM | Admit: 2015-01-01 | Discharge: 2015-01-01 | Disposition: A | Discharge status: Home / Self Care | Payer: 59 | Attending: Emergency Medicine | Admitting: Emergency Medicine

## 2015-01-01 ENCOUNTER — Encounter (HOSPITAL_COMMUNITY): Payer: Self-pay | Admitting: *Deleted

## 2015-01-01 ENCOUNTER — Ambulatory Visit (HOSPITAL_COMMUNITY)
Admission: RE | Admit: 2015-01-01 | Discharge: 2015-01-01 | Disposition: A | Discharge status: Home / Self Care | Payer: 59 | Source: Ambulatory Visit | Attending: Family Medicine | Admitting: Family Medicine

## 2015-01-01 ENCOUNTER — Encounter: Payer: Self-pay | Admitting: Family Medicine

## 2015-01-01 ENCOUNTER — Ambulatory Visit (INDEPENDENT_AMBULATORY_CARE_PROVIDER_SITE_OTHER): Payer: 59 | Admitting: Family Medicine

## 2015-01-01 ENCOUNTER — Encounter (HOSPITAL_COMMUNITY): Payer: Self-pay

## 2015-01-01 VITALS — BP 138/82 | HR 76 | Temp 98.6°F | Resp 14 | Ht 66.0 in | Wt 290.0 lb

## 2015-01-01 DIAGNOSIS — H538 Other visual disturbances: Secondary | ICD-10-CM | POA: Diagnosis present

## 2015-01-01 DIAGNOSIS — H469 Unspecified optic neuritis: Secondary | ICD-10-CM | POA: Insufficient documentation

## 2015-01-01 DIAGNOSIS — Z791 Long term (current) use of non-steroidal anti-inflammatories (NSAID): Secondary | ICD-10-CM | POA: Diagnosis not present

## 2015-01-01 DIAGNOSIS — Z794 Long term (current) use of insulin: Secondary | ICD-10-CM | POA: Insufficient documentation

## 2015-01-01 DIAGNOSIS — I10 Essential (primary) hypertension: Secondary | ICD-10-CM | POA: Insufficient documentation

## 2015-01-01 DIAGNOSIS — E119 Type 2 diabetes mellitus without complications: Secondary | ICD-10-CM | POA: Insufficient documentation

## 2015-01-01 DIAGNOSIS — Z793 Long term (current) use of hormonal contraceptives: Secondary | ICD-10-CM | POA: Insufficient documentation

## 2015-01-01 DIAGNOSIS — R55 Syncope and collapse: Secondary | ICD-10-CM | POA: Diagnosis not present

## 2015-01-01 DIAGNOSIS — R42 Dizziness and giddiness: Secondary | ICD-10-CM | POA: Insufficient documentation

## 2015-01-01 DIAGNOSIS — Z862 Personal history of diseases of the blood and blood-forming organs and certain disorders involving the immune mechanism: Secondary | ICD-10-CM | POA: Insufficient documentation

## 2015-01-01 DIAGNOSIS — I639 Cerebral infarction, unspecified: Secondary | ICD-10-CM

## 2015-01-01 DIAGNOSIS — Z79899 Other long term (current) drug therapy: Secondary | ICD-10-CM | POA: Diagnosis not present

## 2015-01-01 DIAGNOSIS — H5711 Ocular pain, right eye: Secondary | ICD-10-CM | POA: Diagnosis present

## 2015-01-01 MED ORDER — MECLIZINE HCL 25 MG PO TABS: 25.0000 mg | ORAL_TABLET | Freq: Three times a day (TID) | ORAL | Status: DC | PRN

## 2015-01-01 MED ORDER — METHYLPREDNISOLONE SODIUM SUCC 1000 MG IJ SOLR
INTRAMUSCULAR | Status: AC
  Filled 2015-01-01: qty 8

## 2015-01-01 MED ORDER — METHYLPREDNISOLONE SODIUM SUCC 1000 MG IJ SOLR
1000.0000 mg | Freq: Once | INTRAMUSCULAR | Status: AC
  Administered 2015-01-01: 1000 mg via INTRAVENOUS
  Filled 2015-01-01: qty 8

## 2015-01-01 MED ORDER — SODIUM CHLORIDE 0.9 % IV SOLN: INTRAVENOUS | Status: DC

## 2015-01-01 NOTE — ED Notes (Addendum)
R eye pain x 2 weeks w/associated dizzy spells and headaches.  Seen by Dr. Jeanice Lim today who ordered MRI which was done today.  Was told to go to eye doctor. He told her to come to ER.  MRI showed inflammation and swelling on nerve of R eye. Suspicious for MS.

## 2015-01-01 NOTE — Discharge Instructions (Signed)
Follow-up with neurology as arranged. Return for any new or worse symptoms.

## 2015-01-01 NOTE — ED Provider Notes (Signed)
CSN: [id number]     Arrival date & time 01/01/15  1716 History  This chart was scribed for Fredia Sorrow, MD by Delphia Grates, ED Scribe. This patient was seen in room APAH2/AP[facility]2 and the patient's care was started at 6:08 PM.   Chief Complaint  Patient presents with  . Eye Pain    Patient is a 44 y.o. female presenting with eye pain. The history is provided by the patient. No language interpreter was used.  Eye Pain This is a new problem. The current episode started more than 1 week ago. The problem has been gradually worsening. Associated symptoms include headaches. Pertinent negatives include no chest pain, no abdominal pain and no shortness of breath. Nothing aggravates the symptoms. Nothing relieves the symptoms. She has tried nothing for the symptoms. The treatment provided no relief.     HPI Comments: Katrina Montes is a 44 y.o. female who presents to the Emergency Department complaining of gradually worsening right eye pain for the past 2 weeks. Patient was sent here by Dr. Buelah Manis after an MRI revealed positive findings for show optic neuritis with a suspicion for MS. There is associated dizziness and slight HA. Patient notes her symptoms are worse at night. She denies fever, chills, cough, sore throat, rhinorrhea, SOB, chest pain, leg swelling, abdominal pain, nausea, vomiting, diarrhea, urinary symptoms, rash, or back pain.   Past Medical History  Diagnosis Date  . Diabetes mellitus   . Anemia   . Hypertension   . Carpal tunnel syndrome    Past Surgical History  Procedure Laterality Date  . Cyst excision    . Breast reduction surgery     Family History  Problem Relation Age of Onset  . Cancer Mother   . Diabetes Mother   . Hearing loss Mother   . Thyroid nodules Mother   . Anemia Mother   . Kidney disease Mother   . Hypertension Mother   . Hyperlipidemia Father   . Anemia Sister   . Arthritis Maternal Grandmother   . Arthritis Maternal Grandfather   .  Arthritis Paternal Grandmother   . Kidney disease Paternal Grandmother   . Hypertension Paternal Grandmother   . Arthritis Paternal Grandfather   . Vision loss Paternal Grandfather   . Stroke Paternal Grandfather   . Hyperlipidemia Paternal Grandfather   . Anemia Sister    History  Substance Use Topics  . Smoking status: Never Smoker   . Smokeless tobacco: Never Used  . Alcohol Use: No   OB History    No data available     Review of Systems  Constitutional: Negative for fever and chills.  HENT: Negative for rhinorrhea and sore throat.   Eyes: Positive for pain and visual disturbance.  Respiratory: Negative for cough and shortness of breath.   Cardiovascular: Negative for chest pain and leg swelling.  Gastrointestinal: Negative for nausea, vomiting, abdominal pain and diarrhea.  Genitourinary: Negative for dysuria.  Musculoskeletal: Negative for back pain.  Skin: Negative for rash.  Neurological: Positive for dizziness and headaches.  Hematological: Does not bruise/bleed easily.  Psychiatric/Behavioral: Negative for confusion.      Allergies  Review of patient's allergies indicates no known allergies.  Home Medications   Prior to Admission medications   Medication Sig Start Date End Date Taking? Authorizing Provider  Blood Glucose Monitoring Suppl (BLOOD GLUCOSE METER KIT AND SUPPLIES) Dispense based on patient and insurance preference. Use to monitor FSBS 1x daily fasting. Dx: 250.00 07/31/14   Alycia Rossetti,  MD  Canagliflozin-Metformin HCl 50-1000 MG TABS Take 1 tablet by mouth 2 (two) times daily. 09/18/14   Alycia Rossetti, MD  diclofenac (VOLTAREN) 75 MG EC tablet Take 1 tablet (75 mg total) by mouth 2 (two) times daily. 11/02/14   Alycia Rossetti, MD  glucose blood (ACCU-CHEK AVIVA PLUS) test strip Monitor FSBS 1x daily fasting. Dx: 250.00 07/31/14   Alycia Rossetti, MD  Lancet Devices [facility]) lancets Monitor FSBS 1x daily fasting. Dx: 250.00  07/31/14   Alycia Rossetti, MD  lisinopril-hydrochlorothiazide (PRINZIDE,ZESTORETIC) 10-12.5 MG per tablet TAKE 1 TABLET BY MOUTH DAILY. BLOOD PRESSURE 12/17/14   Alycia Rossetti, MD  meclizine (ANTIVERT) 25 MG tablet Take 1 tablet (25 mg total) by mouth 3 (three) times daily as needed for dizziness. 01/01/15   Alycia Rossetti, MD  NON FORMULARY Nutriburst- supplement- (1) Tbs PO QD    Historical Provider, MD  NON FORMULARY [place] Tea- 16oz PO QD    Historical Provider, MD  norgestimate-ethinyl estradiol (SPRINTEC 28) 0.25-35 MG-MCG tablet Take 1 tablet by mouth daily. 11/02/14   Alycia Rossetti, MD  Vitamin D, Ergocalciferol, (DRISDOL) 50000 UNITS CAPS capsule Take 1 capsule (50,000 Units total) by mouth every 7 (seven) days. 08/04/14   Alycia Rossetti, MD   Triage Vitals: BP 136/95 mmHg  Pulse 90  Temp(Src) 98.4 F (36.9 C) (Oral)  Resp 16  Ht _0  (1.702 m)  Wt 290 lb (131.543 kg)  BMI 45.41 kg/m2  SpO2 100%  LMP 12/22/2014  Physical Exam  Constitutional: She is oriented to person, place, and time. She appears well-developed and well-nourished. No distress.  HENT:  Head: Normocephalic and atraumatic.  Eyes: Conjunctivae and EOM are normal. Pupils are equal, round, and reactive to light.  Neck: Neck supple. No tracheal deviation present.  Cardiovascular: Normal rate, regular rhythm and normal heart sounds.   No murmur heard. Pulmonary/Chest: Effort normal and breath sounds normal. No respiratory distress.  Abdominal: Soft. Bowel sounds are normal. There is no tenderness.  Musculoskeletal: Normal range of motion.  Neurological: She is alert and oriented to person, place, and time. No cranial nerve deficit. She exhibits normal muscle tone. Coordination normal.  Skin: Skin is warm and dry.  Psychiatric: She has a normal mood and affect. Her behavior is normal.  Nursing note and vitals reviewed.   ED Course  Procedures (including critical care time)  DIAGNOSTIC STUDIES: Oxygen  Saturation is 100% on room air, normal by my interpretation.    COORDINATION OF CARE: At 6294 Discussed treatment plan with patient. Patient agrees.    Labs Review Labs Reviewed - No data to display  Imaging Review Mr Brain Wo Contrast  01/01/2015   CLINICAL DATA:  RIGHT-sided blurred vision with dizzy spells and near syncope. Initial encounter.  EXAM: MRI HEAD WITHOUT CONTRAST  TECHNIQUE: Multiplanar, multiecho pulse sequences of the brain and surrounding structures were obtained without intravenous contrast.  COMPARISON:  None.  FINDINGS: No evidence for acute infarction, hemorrhage, mass lesion, hydrocephalus, or extra-axial fluid. Normal cerebral volume.  Moderately advanced predominantly periventricular white matter signal abnormality with significant involvement of the corpus callosum. Some lesions are radially arranged perpendicular to that structure. Some lesions have not ovoid configuration. There is T2 and FLAIR hyperintensity of callosal septal interface. Brainstem involvement is observed in the BILATERAL LEFT greater than RIGHT cerebral peduncles. No significant cerebellar or upper cervical involvement.  Study was performed as a routine brain MR, and the orbits are incompletely  evaluated, but there is suspicion for RIGHT optic nerve enlargement, T2 and FLAIR hyperintensity, and slight restricted diffusion. Findings are concerning for acute optic neuritis on the RIGHT.  Pituitary, pineal, and cerebellar tonsils unremarkable. No upper cervical lesions. Flow voids are maintained throughout the carotid, basilar, and vertebral arteries. There are no areas of chronic hemorrhage. Visualized calvarium, skull base, and upper cervical osseous structures unremarkable. Scalp and extracranial soft tissues, sinuses, and mastoids show no acute process.  IMPRESSION: Findings most consistent with acute RIGHT optic neuritis in this patient with probable chronic multiple sclerosis. See comments above.    Electronically Signed   By: Rolla Flatten M.D.   On: 01/01/2015 14:53     EKG Interpretation None      MDM   Final diagnoses:  Optic neuritis    The patient referred in from Dr. Dorian Heckle office. Also in consultation with neurology patient is to receive 1 g of Solu-Medrol IV and then we'll repeat that the dosage eating starting on Monday but they'll arrange for her to get that in the office. Patient with MRI today consistent with optic neuritis on the right side which is where her symptoms have been for the past 2 weeks. No significant changes. Symptoms of course since MRI findings could be consistent with MS. Patient has follow-up with Dr. Merlene Laughter  I personally performed the services described in this documentation, which was scribed in my presence. The recorded information has been reviewed and is accurate.     Fredia Sorrow, MD 01/01/15 1836

## 2015-01-01 NOTE — Progress Notes (Signed)
Patient ID: Katrina Montes, female   DOB: Oct 09, 1971, 44 y.o.   MRN: [id number]   Subjective:    Patient ID: Katrina Montes, female    DOB: 10/24/1971, 44 y.o.   MRN: [id number]  Patient presents for Dizzy Spells  patient here with dizzy spells and headaches on the right side as well as loss of vision in her right eye. She states that she cannot see out of the corner of her eye and also looks hazy when she looks directly on this is been going on for the past 2 weeks. She has checked her blood sugar and her blood pressure labile been normal during the spells they'll worsen the evening but occur multiple times on the day. 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 She was also laid off the first of the year.    Review Of Systems:  GEN- denies fatigue, fever, weight loss,weakness, recent illness HEENT- denies eye drainage, +change in vision, nasal discharge, CVS- denies chest pain, palpitations RESP- denies SOB, cough, wheeze ABD- denies N/V, change in stools, abd pain GU- denies dysuria, hematuria, dribbling, incontinence MSK- denies joint pain, muscle aches, injury Neuro- denies headache,+ dizziness, syncope, seizure activity       Objective:    BP 138/82 mmHg  Pulse 76  Temp(Src) 98.6 F (37 C) (Oral)  Resp 14  Ht  (1.676 m)  Wt 290 lb (131.543 kg)  BMI 46.83 kg/m2  LMP 12/16/2014 (Approximate) GEN- NAD, alert and oriented x3 HEENT- PERRL, EOMI, non injected sclera, pink conjunctiva, MMM, oropharynx clear Neck- Supple, no LAD, no bruit CVS- RRR, no murmur RESP-CTAB Neuro- Left CNII in tact, III-XII in tact, Right eye- decreased peripheral vision, right side, no cataract seen, no papilledema         Assessment & Plan:      Problem List Items Addressed This Visit    None    Visit Diagnoses    Occipital stroke    -  Primary    Concern for  occipital stroke or other eye lesion, STAT MRI of brain to be done, will also have her see Eye doctor Dr. Hyacinth Meeker    Relevant Orders    MR Brain Wo Contrast (Completed)    Optic neuritis        MRI results show concern for optic neurititis and MS, see MRI report, Discussed with neurology and Opthalmology, as subacute treat IV solumedrol x 3 doses, f/u neurology in office for MS work up Pt is to get 1 dose today, will have another dose Mon and Tuesday per neurology recommendations       Note: This dictation was prepared with Dragon dictation along with smaller phrase technology. Any transcriptional errors that result from this process are unintentional.

## 2015-01-01 NOTE — Patient Instructions (Signed)
Take meclizine for dizziness We will call with eye appointment MRI to be scheduled F/U pending results

## 2015-01-01 NOTE — ED Notes (Signed)
AC called for infusion.

## 2015-01-02 NOTE — Addendum Note (Signed)
Addended by: Milinda Antis F on: 01/02/2015 09:26 AM   Modules accepted: Orders

## 2015-01-04 ENCOUNTER — Encounter (HOSPITAL_COMMUNITY)
Admission: RE | Admit: 2015-01-04 | Discharge: 2015-01-04 | Disposition: A | Discharge status: Home / Self Care | Payer: 59 | Source: Ambulatory Visit | Attending: Family Medicine | Admitting: Family Medicine

## 2015-01-04 ENCOUNTER — Encounter (HOSPITAL_COMMUNITY): Payer: Self-pay

## 2015-01-04 ENCOUNTER — Other Ambulatory Visit: Payer: Self-pay | Admitting: Neurology

## 2015-01-04 ENCOUNTER — Telehealth: Payer: Self-pay | Admitting: Family Medicine

## 2015-01-04 DIAGNOSIS — H469 Unspecified optic neuritis: Secondary | ICD-10-CM | POA: Insufficient documentation

## 2015-01-04 DIAGNOSIS — I639 Cerebral infarction, unspecified: Secondary | ICD-10-CM | POA: Insufficient documentation

## 2015-01-04 DIAGNOSIS — G35 Multiple sclerosis: Secondary | ICD-10-CM

## 2015-01-04 MED ORDER — SODIUM CHLORIDE 0.9 % IV SOLN
1000.0000 mg | INTRAVENOUS | Status: DC
  Administered 2015-01-04: 1000 mg via INTRAVENOUS
  Filled 2015-01-04: qty 8

## 2015-01-04 NOTE — Telephone Encounter (Signed)
Pt has appt today with Dr. Gerilyn Pilgrim at 10am, pt is aware.

## 2015-01-04 NOTE — Telephone Encounter (Signed)
Patient is calling in regarding to her neurology referral  [phone]

## 2015-01-05 ENCOUNTER — Encounter (HOSPITAL_COMMUNITY)
Admission: RE | Admit: 2015-01-05 | Discharge: 2015-01-05 | Disposition: A | Discharge status: Home / Self Care | Payer: 59 | Source: Ambulatory Visit | Attending: Family Medicine | Admitting: Family Medicine

## 2015-01-05 DIAGNOSIS — H469 Unspecified optic neuritis: Secondary | ICD-10-CM | POA: Diagnosis not present

## 2015-01-05 MED ORDER — SODIUM CHLORIDE 0.9 % IV SOLN
1000.0000 mg | INTRAVENOUS | Status: AC
  Administered 2015-01-05: 1000 mg via INTRAVENOUS
  Filled 2015-01-05: qty 8

## 2015-01-05 MED ORDER — METHYLPREDNISOLONE SODIUM SUCC 125 MG IJ SOLR: 1000.0000 mg | Freq: Once | INTRAMUSCULAR | Status: DC

## 2015-01-05 MED ORDER — SODIUM CHLORIDE 0.9 % IV SOLN
INTRAVENOUS | Status: DC
  Administered 2015-01-05: 200 mL via INTRAVENOUS

## 2015-01-05 NOTE — Telephone Encounter (Signed)
Patient is calling today to inquire about a second opinion referral please call her at [phone]

## 2015-01-06 NOTE — Telephone Encounter (Signed)
Pt called stating that she has had an ear infection now for 2-3 days and wants to know if we can call something in for her.  [facility] [address]

## 2015-01-06 NOTE — Telephone Encounter (Signed)
Needs to be seen

## 2015-01-06 NOTE — Telephone Encounter (Signed)
Call placed to patient and patient made aware.   Appointment scheduled.   Patient states that she has increased pain to ear. Advised to use warm sweet oil drops for irritation.

## 2015-01-07 ENCOUNTER — Encounter: Payer: Self-pay | Admitting: Physician Assistant

## 2015-01-07 ENCOUNTER — Ambulatory Visit (INDEPENDENT_AMBULATORY_CARE_PROVIDER_SITE_OTHER): Payer: 59 | Admitting: Physician Assistant

## 2015-01-07 VITALS — BP 104/62 | HR 112 | Temp 98.2°F | Resp 20 | Wt 288.0 lb

## 2015-01-07 DIAGNOSIS — E1165 Type 2 diabetes mellitus with hyperglycemia: Secondary | ICD-10-CM

## 2015-01-07 DIAGNOSIS — IMO0002 Reserved for concepts with insufficient information to code with codable children: Secondary | ICD-10-CM

## 2015-01-07 DIAGNOSIS — H9201 Otalgia, right ear: Secondary | ICD-10-CM

## 2015-01-07 DIAGNOSIS — H469 Unspecified optic neuritis: Secondary | ICD-10-CM

## 2015-01-07 MED ORDER — HYDROCODONE-ACETAMINOPHEN 5-325 MG PO TABS: 1.0000 | ORAL_TABLET | Freq: Four times a day (QID) | ORAL | Status: DC | PRN

## 2015-01-08 NOTE — Progress Notes (Signed)
Patient ID: Katrina Montes MRN: [id number], DOB: August 15, 1971, 44 y.o. Date of Encounter: $[email or web address]$ @  Chief Complaint:  Chief Complaint  Patient presents with  . sick and very dizzy    having IV treatments  making her sick sugars are high, feels like passing out  now with terrible ear aches    HPI: 44 y.o. year old AA female  presents with above symptoms.  I have reviewed Dr. Dorian Heckle office visit note from 01/01/15.  I have also reviewed ER note from 01/01/2015.  With in that report also reviewed the MRI report done that date.  Please review all of th those separate notes for all of that information.  She presented on 01/01/15 reporting that for the past 2 weeks she had been having dizzy spells and headaches on the right side as well as loss of vision in her right eye. She will are where MRI showed concern for Right optic neuritis and multiple sclerosis. ER consult neurology who recommended to treat with IV Solu-Medrol 3 doses and follow-up with neurology in the office. She was to get 1 dose that day then another dose on that Monday and Tuesday per neurology recommendations.  Today she states that she saw Dr. Merlene Laughter on Monday. Says that she has completed the IV treatments. Says that Dr. Merlene Laughter has her scheduled for spinal tap.  However she is in the process of getting a referral to another neurologist for a "second opinion"  She states that her right ear has felt like an ear ache for the past couple days. Feels like significant pain deep in the ear just like a ear ache feels like. Has had no mucus from the nose no sore throat no cough.  Also reports that today fasting blood sugar was 236. Was wondering whether to adjust diabetic medications.   Past Medical History  Diagnosis Date  . Diabetes mellitus   . Anemia   . Hypertension   . Carpal tunnel syndrome      Home Meds: Outpatient Prescriptions Prior to Visit  Medication Sig Dispense Refill  . Blood Glucose  Monitoring Suppl (BLOOD GLUCOSE METER KIT AND SUPPLIES) Dispense based on patient and insurance preference. Use to monitor FSBS 1x daily fasting. Dx: 250.00 1 each 0  . Canagliflozin-Metformin HCl 50-1000 MG TABS Take 1 tablet by mouth 2 (two) times daily. 60 tablet 6  . cholecalciferol (VITAMIN D) 1000 UNITS tablet Take 1,000 Units by mouth 2 (two) times daily.    . diclofenac (VOLTAREN) 75 MG EC tablet Take 1 tablet (75 mg total) by mouth 2 (two) times daily. 60 tablet 2  . glucose blood (ACCU-CHEK AVIVA PLUS) test strip Monitor FSBS 1x daily fasting. Dx: 250.00 100 each 3  . Lancet Devices (ACCU-CHEK SOFTCLIX) lancets Monitor FSBS 1x daily fasting. Dx: 250.00 100 each 3  . lisinopril-hydrochlorothiazide (PRINZIDE,ZESTORETIC) 10-12.5 MG per tablet TAKE 1 TABLET BY MOUTH DAILY. BLOOD PRESSURE 30 tablet 3  . meclizine (ANTIVERT) 25 MG tablet Take 1 tablet (25 mg total) by mouth 3 (three) times daily as needed for dizziness. 30 tablet 0  . norgestimate-ethinyl estradiol (SPRINTEC 28) 0.25-35 MG-MCG tablet Take 1 tablet by mouth daily. 1 Package 11  . Vitamin D, Ergocalciferol, (DRISDOL) 50000 UNITS CAPS capsule Take 1 capsule (50,000 Units total) by mouth every 7 (seven) days. (Patient not taking: Reported on 01/07/2015) 4 capsule 2   No facility-administered medications prior to visit.    Allergies: No Known Allergies  History   Social History  .  Marital Status: Single    Spouse Name: N/A    Number of Children: N/A  . Years of Education: N/A   Occupational History  . Not on file.   Social History Main Topics  . Smoking status: Never Smoker   . Smokeless tobacco: Never Used  . Alcohol Use: No  . Drug Use: No  . Sexual Activity: Not Currently    Birth Control/ Protection: Pill   Other Topics Concern  . Not on file   Social History Narrative    Family History  Problem Relation Age of Onset  . Cancer Mother   . Diabetes Mother   . Hearing loss Mother   . Thyroid nodules  Mother   . Anemia Mother   . Kidney disease Mother   . Hypertension Mother   . Hyperlipidemia Father   . Anemia Sister   . Arthritis Maternal Grandmother   . Arthritis Maternal Grandfather   . Arthritis Paternal Grandmother   . Kidney disease Paternal Grandmother   . Hypertension Paternal Grandmother   . Arthritis Paternal Grandfather   . Vision loss Paternal Grandfather   . Stroke Paternal Grandfather   . Hyperlipidemia Paternal Grandfather   . Anemia Sister      Review of Systems:  See HPI for pertinent ROS. All other ROS negative.    Physical Exam: Blood pressure 104/62, pulse 112, temperature 98.2 F (36.8 C), temperature source Oral, resp. rate 20, weight 288 lb (130.636 kg), last menstrual period 12/22/2014., Body mass index is 45.1 kg/(m^2). General: Overweight African-American female . Appears in no acute distress. Head: Normocephalic, atraumatic, eyes without discharge, sclera non-icteric, nares are without discharge. Bilateral auditory canals clear, TM's are without perforation, pearly grey and translucent with reflective cone of light bilaterally. Oral cavity moist, posterior pharynx without exudate, erythema, peritonsillar abscess. No tenderness with palpation of the TM joint. No discomfort with light touch of the right cheek area over the trigeminal nerve region to suggest trigeminal neuralgia.   Neck: Supple. No thyromegaly. No lymphadenopathy. Lungs: Clear bilaterally to auscultation without wheezes, rales, or rhonchi. Breathing is unlabored. Heart: RRR with S1 S2. No murmurs, rubs, or gallops. Musculoskeletal:  Strength and tone normal for age. Extremities/Skin: Warm and dry. Neuro: Alert and oriented X 3. Moves all extremities spontaneously. Gait is normal. CNII-XII grossly in tact. Psych:  Responds to questions appropriately with a normal affect.     ASSESSMENT AND PLAN:  44 y.o. year old female with  1. Optic neuritis  2. Otalgia, right Ear exam is  normal. Discussed with her that I do not think her ear pain is coming from an ear infection. I'm concerned that the ear pain may be related to the optic neuritis and possible multiple sclerosis. Discussed with her my concern of giving pain medications and masking pain and symptoms. Told her to only take the hydrocodone at night so that she can get some sleep. Ring the day she needs to be able to be aware of her pain and symptoms and if things progress further she will need to go to the ER. - HYDROcodone-acetaminophen (NORCO/VICODIN) 5-325 MG per tablet; Take 1 tablet by mouth every 6 (six) hours as needed.  Dispense: 30 tablet; Refill: 0  3. Diabetes mellitus type II, uncontrolled Discussed that the high blood sugars are secondary to the recent IV Solu-Medrol treatments. Discussed that those effects should be wearing off soon and [place] expect her blood sugars to decrease on their own.   Signed, Karis Juba,  PA, BSFM 01/08/2015 9:45 AM

## 2015-01-11 ENCOUNTER — Encounter: Payer: Self-pay | Admitting: Family Medicine

## 2015-01-12 ENCOUNTER — Telehealth: Payer: Self-pay | Admitting: *Deleted

## 2015-01-12 NOTE — Telephone Encounter (Signed)
Pt called wanting to know if we can refer her to some one in [place] for 2nd opinion Neurology, states not happy with the environment at last office and states there was a history there with her mom. States she does have a spinal tap on Jan 27th with her now neurologist but was wanting to see if can get a 2nd opinion before then, I informed pt that with 2nd opionions it has to go into review first and could take some time. Please advise!

## 2015-01-12 NOTE — Telephone Encounter (Signed)
Okay - place referral to [facility] Neurology, send over Note- should be in scan box right now from Dr. Gerilyn Pilgrim Send my last note and MRI of brain  Dx- Optic Neuritis, Multiple Sclerosis

## 2015-01-12 NOTE — Telephone Encounter (Signed)
Referral sent to [facility] attn Diane

## 2015-01-13 ENCOUNTER — Inpatient Hospital Stay (HOSPITAL_COMMUNITY): Admission: RE | Admit: 2015-01-13 | Payer: 59 | Source: Ambulatory Visit

## 2015-01-13 ENCOUNTER — Ambulatory Visit (HOSPITAL_COMMUNITY)
Admission: RE | Admit: 2015-01-13 | Discharge: 2015-01-13 | Disposition: A | Discharge status: Home / Self Care | Payer: 59 | Source: Ambulatory Visit | Attending: Neurology | Admitting: Neurology

## 2015-01-13 DIAGNOSIS — G35 Multiple sclerosis: Secondary | ICD-10-CM | POA: Insufficient documentation

## 2015-01-13 LAB — CSF CELL COUNT WITH DIFFERENTIAL
RBC Count, CSF: 1650 /mm3 — ABNORMAL HIGH
TUBE #: 4
WBC, CSF: 7 /mm3 — ABNORMAL HIGH (ref 0–5)

## 2015-01-13 LAB — PROTEIN, CSF: Total  Protein, CSF: 708 mg/dL — ABNORMAL HIGH (ref 15–45)

## 2015-01-13 LAB — GLUCOSE, CSF: Glucose, CSF: 120 mg/dL — ABNORMAL HIGH (ref 43–76)

## 2015-01-13 LAB — CRYPTOCOCCAL ANTIGEN, CSF: Crypto Ag: NEGATIVE

## 2015-01-13 MED ORDER — LIDOCAINE HCL (PF) 1 % IJ SOLN
INTRAMUSCULAR | Status: AC
  Filled 2015-01-13: qty 5

## 2015-01-13 MED ORDER — ACETAMINOPHEN 325 MG PO TABS: 650.0000 mg | ORAL_TABLET | ORAL | Status: DC | PRN

## 2015-01-13 NOTE — Discharge Instructions (Signed)
Lumbar Puncture, Care After °Refer to this sheet in the next few weeks. These instructions provide you with information on caring for yourself after your procedure. Your health care provider may also give you more specific instructions. Your treatment has been planned according to current medical practices, but problems sometimes occur. Call your health care provider if you have any problems or questions after your procedure. °WHAT TO EXPECT AFTER THE PROCEDURE °After your procedure, it is typical to have the following sensations: °· Mild discomfort or pain at the insertion site. °· Mild headache that is relieved with pain medicines. °HOME CARE INSTRUCTIONS °· Avoid lifting anything heavier than 10 lb (4.5 kg) for at least 12 hours after the procedure. °· Drink enough fluids to keep your urine clear or pale yellow. °SEEK MEDICAL CARE IF: °· You have fever or chills. °· You have nausea or vomiting. °· You have a headache that lasts for more than 2 days. °SEEK IMMEDIATE MEDICAL CARE IF: °· You have any numbness or tingling in your legs. °· You are unable to control your bowel or bladder. °· You have bleeding or swelling in your back at the insertion site. °· You are dizzy or faint. °Document Released: 12/09/2013 Document Reviewed: 12/09/2013 °Exit[employer]® Patient Information ©2015 [employer]. This information is not intended to replace advice given to you by your health care provider. Make sure you discuss any questions you have with your health care provider. ° °

## 2015-01-13 NOTE — Procedures (Signed)
Preprocedure Dx: Multiple sclerosis Postprocedure Dx: Multiple sclerosis Procedure:  Fluoroscopically guided lumbar puncture Radiologist:  Tyron Russell Anesthesia:  6 ml of 1% lidocaine Specimen:  8.5 ml CSF, bloody appearance c/w traumatic tap EBL:   < 1 ml Opening pressure: Unable to perform Complications: None; note traumatic tap as above

## 2015-01-14 ENCOUNTER — Ambulatory Visit (INDEPENDENT_AMBULATORY_CARE_PROVIDER_SITE_OTHER): Payer: 59 | Admitting: Neurology

## 2015-01-14 ENCOUNTER — Encounter: Payer: Self-pay | Admitting: Neurology

## 2015-01-14 VITALS — BP 132/78 | HR 78 | Ht 66.0 in | Wt 281.0 lb

## 2015-01-14 DIAGNOSIS — G35 Multiple sclerosis: Secondary | ICD-10-CM

## 2015-01-14 DIAGNOSIS — R839 Unspecified abnormal finding in cerebrospinal fluid: Secondary | ICD-10-CM | POA: Insufficient documentation

## 2015-01-14 DIAGNOSIS — H469 Unspecified optic neuritis: Secondary | ICD-10-CM | POA: Insufficient documentation

## 2015-01-14 NOTE — Progress Notes (Addendum)
PATIENT: Katrina Montes DOB: 02/23/1971  HISTORICAL  Katrina Montes is 89 right-handed African-American female, accompanied by her mother, referred by her primary care physician Dr. 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,  At the beginning of 2015, she had transient right eye blurry vision, was considered due to her high glucose, recovered within a few weeks  Since January 2016, she began to experience intermittent vertigo, dizziness, also had worsening right eye blurry vision,, color washing out, could only see her rim at her right peripheral visual field, she denies significant eye pain, MRI of the brain without contrast at [facility] January 2016 showed multiple periventricular white matter disease, significant involvement of corpus callosum, perpendicular to ventricle, consistent with multiple sclerosis, now was also suspicious for right optic nerve edema, with hyperintensity signal at T2, FLAIR,  She was treated with IV steroid in January 15, 18, 19, only has mild improvement of her right vision, has significant worsening of her blood glucose level.  Spinal fluid testing January 13 2015, total protein was 708, RBC was 1600, RBC was 7,, cloudy reddish-looking,  Laboratory showed anemia, hemoglobin 11.8, otherwise normal CBC, normal CMP, with exception of elevated glucose, normal TSH,  REVIEW OF SYSTEMS: Full 14 system review of systems performed and notable only for fatigue, blurry vision, loss of vision, snoring, anemia, joints pain, achy muscles, headaches, numbness, dizziness, passing out, snoring, restless legs.  ALLERGIES: No Known Allergies  HOME MEDICATIONS: Current Outpatient Prescriptions    Medication Sig Dispense Refill  . Blood Glucose Monitoring Suppl (BLOOD GLUCOSE METER KIT AND SUPPLIES) Dispense based on patient and insurance preference. Use to monitor FSBS 1x daily fasting. Dx: 250.00 1 each 0  . Canagliflozin-Metformin HCl 50-1000 MG TABS Take 1 tablet by mouth 2 (two) times daily. 60 tablet 6  . cholecalciferol (VITAMIN D) 1000 UNITS tablet Take 1,000 Units by mouth 2 (two) times daily.    . diclofenac (VOLTAREN) 75 MG EC tablet Take 1 tablet (75 mg total) by mouth 2 (two) times daily. 60 tablet 2  . glucose blood (ACCU-CHEK AVIVA PLUS) test strip Monitor FSBS 1x daily fasting. Dx: 250.00 100 each 3  . HYDROcodone-acetaminophen (NORCO/VICODIN) 5-325 MG per tablet Take 1 tablet by mouth every 6 (six) hours as needed. 30 tablet 0  . Lancet Devices (ACCU-CHEK SOFTCLIX) lancets Monitor FSBS 1x daily fasting. Dx: 250.00 100 each 3  . lisinopril-hydrochlorothiazide (PRINZIDE,ZESTORETIC) 10-12.5 MG per tablet TAKE 1 TABLET BY MOUTH DAILY. BLOOD PRESSURE 30 tablet 3  . meclizine (ANTIVERT) 25 MG tablet Take 1 tablet (25 mg total) by mouth 3 (three) times daily as needed for dizziness. 30 tablet 0  . norgestimate-ethinyl estradiol (SPRINTEC 28) 0.25-35 MG-MCG tablet Take 1 tablet by mouth daily. 1 Package 11  . Vitamin D, Ergocalciferol, (DRISDOL) 50000 UNITS CAPS capsule Take 1 capsule (50,000 Units total) by mouth every 7 (seven) days. 4 capsule 2   No current facility-administered medications for this visit.    PAST MEDICAL HISTORY: Past Medical History  Diagnosis Date  . Diabetes mellitus   . Anemia   . Hypertension   . Carpal tunnel syndrome     PAST SURGICAL HISTORY: Past Surgical History  Procedure Laterality Date  .  Cyst excision    . Breast reduction surgery      FAMILY HISTORY: Family History  Problem Relation Age of Onset  . Cancer Mother   . Diabetes Mother   . Hearing loss Mother   . Thyroid nodules Mother   . Anemia Mother   . Kidney disease  Mother   . Hypertension Mother   . Hyperlipidemia Father   . Anemia Sister   . Arthritis Maternal Grandmother   . Arthritis Maternal Grandfather   . Arthritis Paternal Grandmother   . Kidney disease Paternal Grandmother   . Hypertension Paternal Grandmother   . Arthritis Paternal Grandfather   . Vision loss Paternal Grandfather   . Stroke Paternal Grandfather   . Hyperlipidemia Paternal Grandfather   . Anemia Sister     SOCIAL HISTORY:  History   Social History  . Marital Status: Single    Spouse Name: N/A    Number of Children: 0  . Years of Education: College   Occupational History  . unemployed    Social History Main Topics  . Smoking status: Never Smoker   . Smokeless tobacco: Never Used  . Alcohol Use: No  . Drug Use: No  . Sexual Activity: Not Currently    Birth Control/ Protection: Pill   Other Topics Concern  . Not on file   Social History Narrative   Live at home with parents.   Right handed.   Uses caffeine sparingly.     PHYSICAL EXAM   Filed Vitals:   01/14/15 1447  BP: 132/78  Pulse: 78  Height: _0  (1.676 m)  Weight: 281 lb (127.461 kg)    Not recorded      Body mass index is 45.38 kg/(m^2).   Generalized: In no acute distress  Neck: Supple, no carotid bruits   Cardiac: Regular rate rhythm  Pulmonary: Clear to auscultation bilaterally  Musculoskeletal: No deformity  Neurological examination  Mentation: Alert oriented to time, place, history taking, and causual conversation  Cranial nerve II-XII: Pupils were equal round reactive to light. Extraocular movements were full.  Visual field were full on confrontational test. Bilateral fundi were sharp. Right afferent pupillary defect, Facial sensation and strength were normal. Hearing was intact to finger rubbing bilaterally. Uvula tongue midline.  Head turning and shoulder shrug and were normal and symmetric.Tongue protrusion into cheek strength was normal.  Motor: Normal tone,  bulk and strength.  Sensory: Intact to fine touch, pinprick, preserved vibratory sensation, and proprioception at toes.  Coordination: Normal finger to nose, heel-to-shin bilaterally there was no truncal ataxia  Gait: Rising up from seated position without assistance, normal stance, without trunk ataxia, moderate stride, good arm swing, smooth turning, able to perform tiptoe, and heel walking without difficulty.   Romberg signs: Negative  Deep tendon reflexes: Brachioradialis 2/2, biceps 2/2, triceps 2/2, patellar 2/2, Achilles 2/2, plantar responses were flexor bilaterally.   DIAGNOSTIC DATA (LABS, IMAGING, TESTING) - I reviewed patient records, labs, notes, testing and imaging myself where available.  Lab Results  Component Value Date   WBC 8.6 11/02/2014   HGB 11.1* 11/02/2014   HCT 34.9* 11/02/2014   MCV 79.9 11/02/2014   PLT 367 11/02/2014      Component Value Date/Time   NA 141 11/02/2014 0944   K 5.1 11/02/2014 0944   CL 104 11/02/2014 0944   CO2 25 11/02/2014 0944   GLUCOSE 208* 11/02/2014 0944   BUN 9 11/02/2014 0944   CREATININE 0.70 11/02/2014 0944   CREATININE  0.61 10/11/2010 2308   CALCIUM 9.2 11/02/2014 0944   PROT 7.8 11/02/2014 0944   ALBUMIN 4.1 11/02/2014 0944   AST 15 11/02/2014 0944   ALT 22 11/02/2014 0944   ALKPHOS 79 11/02/2014 0944   BILITOT 0.5 11/02/2014 0944   Lab Results  Component Value Date   CHOL 208* 11/02/2014   HDL 66 11/02/2014   LDLCALC 119* 11/02/2014   TRIG 114 11/02/2014   CHOLHDL 3.2 11/02/2014   Lab Results  Component Value Date   HGBA1C 8.6* 11/02/2014   No results found for: VITAMINB12 Lab Results  Component Value Date   TSH 1.281 07/28/2014      ASSESSMENT AND PLAN  Katrina Montes is a 44 y.o. female  with right optic neuritis since January 2016, significant abnormal MRI findings, consistent with relapsing remediating multiple sclerosis,  1, the atypical features is significantly elevated total protein 708 at  spinal fluid testing, could due to traumatic spinal tap, hemodialysis, she does complains of frequent worsening headaches, MRA to rule out aneurysm 2, MRI of brain with contrast, MRI of cervical with and without contrast 3. Laboratory evaluation to rule out ms mimics 4.  visual evoked potential  5. Return to clinic in 2 to 3 weeks 6. Acthar for right optic neuritis  Orders Placed This Encounter  Procedures  . MR Brain W Contrast  . MR MRA Headm  . MR Cervical Spine W Wo Contrast  . HIV antibody  . Folate  . C-reactive protein  . CK  . Stratify JCV Antibody Test ([facility])  . Sedimentation rate  . IFE and PE, Serum  . B. burgdorfi antibodies  . Varicella zoster antibody, IgG  . Vitamin D 1,25 dihydroxy  . Hepatitis panel, acute  . Visual evoked potential test     Return in about 3 weeks (around 02/04/2015). Marcial Pacas, M.D. Ph.D.  [facility] [address] [place], [place] [address] [phone]

## 2015-01-15 ENCOUNTER — Ambulatory Visit (INDEPENDENT_AMBULATORY_CARE_PROVIDER_SITE_OTHER): Payer: 59 | Admitting: Neurology

## 2015-01-15 DIAGNOSIS — H469 Unspecified optic neuritis: Secondary | ICD-10-CM

## 2015-01-15 DIAGNOSIS — G35 Multiple sclerosis: Secondary | ICD-10-CM

## 2015-01-15 DIAGNOSIS — R839 Unspecified abnormal finding in cerebrospinal fluid: Secondary | ICD-10-CM

## 2015-01-15 LAB — VDRL, CSF: VDRL Quant, CSF: NONREACTIVE

## 2015-01-15 NOTE — Procedures (Signed)
    History:   Katrina Montes is a 44 year old patient with a history of visual disturbance involving the right eye that began in January 2016. The patient has an abnormal MRI, and she is being evaluated for possible demyelinating disease.  Description: The visual evoked response test was performed today using 32 x 32 check sizes. The absolute latencies for the N1 and the P100 wave forms were within normal limits on the left, with prolongation of the N1 and P100 latencies on the right. The amplitudes for the P100 wave forms were within normal limits bilaterally. The visual acuity was 20/70 OD and 20/30 OS uncorrected.  Impression:  The visual evoked response test above was within normal limits on the left, but study on the right side revealed conduction slowing within the anterior visual pathway consistent with a demyelinating lesion within the optic nerve. Clinical correlation is required.Marland Kitchen

## 2015-01-18 DIAGNOSIS — H469 Unspecified optic neuritis: Secondary | ICD-10-CM

## 2015-01-18 DIAGNOSIS — G35 Multiple sclerosis: Secondary | ICD-10-CM

## 2015-01-19 ENCOUNTER — Telehealth: Payer: Self-pay | Admitting: Neurology

## 2015-01-19 ENCOUNTER — Other Ambulatory Visit: Payer: Self-pay | Admitting: Neurology

## 2015-01-19 DIAGNOSIS — G35 Multiple sclerosis: Secondary | ICD-10-CM

## 2015-01-19 DIAGNOSIS — H469 Unspecified optic neuritis: Secondary | ICD-10-CM

## 2015-01-19 DIAGNOSIS — R839 Unspecified abnormal finding in cerebrospinal fluid: Secondary | ICD-10-CM

## 2015-01-19 NOTE — Telephone Encounter (Signed)
Pt aware of her test results. HP Acthar Gel was not approved prior to her losing her insurance. Shanda Bumps is going to start working on patient assistance for her.

## 2015-01-19 NOTE — Telephone Encounter (Signed)
Michelle:  Please call patient, MRI brain and cervical continue to show lesions, I will go over detail with her at  follow up visit, VEP showed right optic nerve slow conduction, consistent with right optic neuritis. MRA of the brain showed no evidence of aneurysm

## 2015-01-20 ENCOUNTER — Telehealth: Payer: Self-pay | Admitting: *Deleted

## 2015-01-20 LAB — MULTIPLE SCLEROSIS PANEL 2
Albumin CSF: 223 mg/dL — ABNORMAL HIGH (ref <35)
Albumin Index: 65.4 — ABNORMAL HIGH (ref <9.0)
Albumin: 3410 mg/dL — ABNORMAL LOW (ref 3700–5410)
CNS-IgG Synthesis Rate: 238 mg/24hr — ABNORMAL HIGH (ref <3.3)
IGA MSPROF: 4.824 mg/dL — AB (ref <0.010)
IGA TOTAL: 188 mg/dL (ref 81–463)
IGG MSPROF: 17.76 mg/dL — AB (ref <0.10)
IGG TOTAL CSF: 87.5 mg/dL — AB (ref 0.5–6.1)
IgA CSF: 12.8 mg/dL — ABNORMAL HIGH (ref 0.15–0.60)
IgG Total: 1378 mg/dL (ref 694–1618)
IgG-Index: 0.97 — ABNORMAL HIGH (ref <0.70)
IgM MSPROF: 1.702 mg/dL — ABNORMAL HIGH (ref <0.010)
IgM Total: 57 mg/dL (ref 48–271)
IgM-CSF: 3.74 mg/dL — ABNORMAL HIGH (ref <0.10)
MYELIN BASIC PROTEIN, CSF: 3.4 ug/L (ref <4.1)

## 2015-01-20 LAB — IFE AND PE, SERUM
ALBUMIN SERPL ELPH-MCNC: 3.3 g/dL (ref 3.2–5.6)
ALBUMIN/GLOB SERPL: 0.9 (ref 0.7–2.0)
ALPHA 1: 0.3 g/dL (ref 0.1–0.4)
Alpha2 Glob SerPl Elph-Mcnc: 0.8 g/dL (ref 0.4–1.2)
B-Globulin SerPl Elph-Mcnc: 1.3 g/dL (ref 0.6–1.3)
GLOBULIN, TOTAL: 4 g/dL (ref 2.0–4.5)
Gamma Glob SerPl Elph-Mcnc: 1.5 g/dL (ref 0.5–1.6)
IGA/IMMUNOGLOBULIN A, SERUM: 204 mg/dL (ref 91–414)
IGM (IMMUNOGLOBULIN M), SRM: 64 mg/dL (ref 40–230)
IgG (Immunoglobin G), Serum: 1588 mg/dL (ref 700–1600)
Total Protein: 7.3 g/dL (ref 6.0–8.5)

## 2015-01-20 LAB — HEPATITIS PANEL, ACUTE
HEP A IGM: NEGATIVE
HEP B C IGM: NEGATIVE
Hepatitis B Surface Ag: NEGATIVE

## 2015-01-20 LAB — VARICELLA ZOSTER ANTIBODY, IGG: VARICELLA: 1004 {index} (ref >165)

## 2015-01-20 LAB — VITAMIN D 1,25 DIHYDROXY
VITAMIN D 1, 25 (OH) TOTAL: 94 pg/mL
Vitamin D2 1, 25 (OH)2: 53 pg/mL
Vitamin D3 1, 25 (OH)2: 41 pg/mL

## 2015-01-20 LAB — HIV ANTIBODY (ROUTINE TESTING W REFLEX): HIV Screen 4th Generation wRfx: NONREACTIVE

## 2015-01-20 LAB — SEDIMENTATION RATE: Sed Rate: 64 mm/hr — ABNORMAL HIGH (ref 0–32)

## 2015-01-20 LAB — C-REACTIVE PROTEIN: CRP: 13.3 mg/L — ABNORMAL HIGH (ref 0.0–4.9)

## 2015-01-20 LAB — FOLATE: FOLATE: 13.2 ng/mL (ref >3.0)

## 2015-01-20 LAB — CK: Total CK: 36 U/L (ref 24–173)

## 2015-01-20 NOTE — Telephone Encounter (Signed)
-----  Message from Yijun Yan, MD sent at 01/20/2015  9:37 AM EST ----- Please call patient, laboratory showed elevated ESR, C-reactive protein, rest of the laboratory was normal, may repeat lab again in next follow-up 

## 2015-01-20 NOTE — Telephone Encounter (Signed)
Pt aware of lab results 

## 2015-01-20 NOTE — Telephone Encounter (Signed)
-----  Message from Marcial Pacas, MD sent at 01/20/2015  9:37 AM EST ----- Please call patient, laboratory showed elevated ESR, C-reactive protein, rest of the laboratory was normal, may repeat lab again in next follow-up

## 2015-01-20 NOTE — Telephone Encounter (Signed)
Patient returning [place], RN's call.  Please return call to [phone].

## 2015-01-20 NOTE — Progress Notes (Signed)
Quick Note:  Please call patient, laboratory showed elevated ESR, C-reactive protein, rest of the laboratory was normal, may repeat lab again in next follow-up ______ 

## 2015-01-20 NOTE — Telephone Encounter (Signed)
Left a message, on both home and cell numbers, for patient to call back.

## 2015-01-21 ENCOUNTER — Telehealth: Payer: Self-pay | Admitting: *Deleted

## 2015-01-21 NOTE — Telephone Encounter (Signed)
I called back.  Spoke with Debbie.  She was not able to assist and transferred me to [place].  I verified Rx with her.  Says they will proceed with order and contact patient to schedule shipment.  They will call us back if anything further is needed.

## 2015-01-21 NOTE — Telephone Encounter (Signed)
Patient was approved and will receive Acther free for one year. The patient assistance would like a call to verify how the patient would be receiving the med's. Please call at [phone].

## 2015-01-22 ENCOUNTER — Telehealth: Payer: Self-pay | Admitting: Neurology

## 2015-01-22 NOTE — Telephone Encounter (Signed)
Will go over MRi findings and treatment plan at her follow up visit.

## 2015-02-02 ENCOUNTER — Telehealth: Payer: Self-pay | Admitting: Family Medicine

## 2015-02-02 ENCOUNTER — Telehealth: Payer: Self-pay | Admitting: *Deleted

## 2015-02-02 NOTE — Telephone Encounter (Signed)
Patient is calling concerning her treatments she is supposed to be getting and a nurse coming out to do this, and she has not heard anything from anyone about this. Would like to know if she can come in and someone show her how to do these injections if possible or give her advice about what she should do  [phone]

## 2015-02-02 NOTE — Telephone Encounter (Signed)
Received call from patient.   Patient states that she has some questions about the billing for her appointment and the emergent MRI she had per our request.   Please contact her when you get a chance.

## 2015-02-02 NOTE — Telephone Encounter (Signed)
Call placed to patient to inquire.   States that she has injection that neurologist has ordered. Reports that [facility] office is trying to find a nurse to come out to her home to teach them how to inject the medication, but it has been >1 week.   Requested to come to office to have instructions on how to inject medication. Advised to contact neurologist for recommendations since they ordered injections.

## 2015-02-04 ENCOUNTER — Other Ambulatory Visit: Payer: Self-pay | Admitting: *Deleted

## 2015-02-04 ENCOUNTER — Telehealth: Payer: Self-pay | Admitting: Neurology

## 2015-02-04 NOTE — Telephone Encounter (Signed)
Spoke to patient - she is not sure how to inject Acthar - I can give her the number to set up home training [facility] [phone]). She is working out Econom[employer] until she gets another job so right now, she is uninsured.  Do you still want her to come to keep the 02/08/15 appt or can she push it out a little further while she is working on alternate health coverage?

## 2015-02-04 NOTE — Telephone Encounter (Signed)
Patient is calling because she needs to know if she can bring Acthar medication to her appointment on 02-08-15 so patient can be told how it is to be administered. Also patient wants to know what all will be involved in the appointment on 02-08-15 because she does not have any insurance. Please call and advise.

## 2015-02-05 NOTE — Telephone Encounter (Signed)
She has active form of MS, needs to be on treatment, the sooner the better, Yes, she can bring her acthar medication, we can teach her how to do IM injection during her visit.

## 2015-02-05 NOTE — Telephone Encounter (Signed)
Spoke to Katrina Montes and her mother - the Acthar nurse is already scheduled to come to her house for injection training.  She and mother will be here Monday to discuss treatment options.

## 2015-02-08 ENCOUNTER — Ambulatory Visit (INDEPENDENT_AMBULATORY_CARE_PROVIDER_SITE_OTHER): Payer: 59 | Admitting: Neurology

## 2015-02-08 ENCOUNTER — Encounter: Payer: Self-pay | Admitting: Neurology

## 2015-02-08 VITALS — BP 142/88 | HR 72 | Ht 66.0 in | Wt 284.0 lb

## 2015-02-08 DIAGNOSIS — H469 Unspecified optic neuritis: Secondary | ICD-10-CM

## 2015-02-08 DIAGNOSIS — G35 Multiple sclerosis: Secondary | ICD-10-CM

## 2015-02-08 NOTE — Progress Notes (Signed)
PATIENT: Katrina Montes DOB: February 20, 1971  HISTORICAL  Katrina Montes is 44 right-handed African-American female, accompanied by her mother, referred by her primary care physician Dr. 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,  At the beginning of 2015, she had transient right eye blurry vision, was considered due to her high glucose, recovered within a few weeks  Since January 2016, she began to experience intermittent vertigo, dizziness, also had worsening right eye blurry vision,, color washing out, could only see her rim at her right peripheral visual field, she denies significant eye pain  MRI of the brain without contrast at [facility] imaging January 2016 showed multiple periventricular white matter disease, significant involvement of corpus callosum, perpendicular to ventricle, consistent with multiple sclerosis, now was also suspicious for right optic nerve edema, with hyperintensity signal at T2, FLAIR,  She was treated with IV steroid in January 15, 18, 19, only has mild improvement of her right vision, has significant worsening of her blood glucose level.  Spinal fluid testing January 13 2015, total protein was 708, RBC was 1600, RBC was 7,, cloudy reddish-looking,  Laboratory showed anemia, hemoglobin 11.8, otherwise normal CBC, normal CMP, with exception of elevated glucose, normal TSH,  UPDATE Feb 22nd 2016: Acthar injection training today, she still has right blurry vision, mild improvement, mild unsteady gait, her diabetes is under suboptimal control,  We have reviewed MRI of the cervical spine with and without contrast showing hyperintense foci within the spinal cord posteriorly at C4 and  posteriolaterally at C7-T1 consistent with multiple sclerosis plaques. There were no enhancing foci.  There was no enhancing lesions at MRI of the brain, MRA of the brain was normal,   JC virus was positive with titer of 1.1 3, laboratory evaluation showed normal or negative CPK, HIV, protein electrophoresis, positive varicella-zoster virus antibody, mild elevated ESR, C-reactive protein, hepatitis panel, Lyme titer,   Visual evoked potential showed prolongation on the right side.  REVIEW OF SYSTEMS: Full 14 system review of systems performed and notable only for fatigue, blurry vision, loss of vision, snoring, anemia, joints pain, achy muscles, headaches, numbness, dizziness, passing out, snoring, restless legs.  ALLERGIES: No Known Allergies  HOME MEDICATIONS: Current Outpatient Prescriptions  Medication Sig Dispense Refill  . Blood Glucose Monitoring Suppl (BLOOD GLUCOSE METER KIT AND SUPPLIES) Dispense based on patient and insurance preference. Use to monitor FSBS 1x daily fasting. Dx: 250.00 1 each 0  . Calcium-Magnesium-Zinc (CALCIUM-MAGNESUIUM-ZINC PO) Take by mouth.    . Canagliflozin-Metformin HCl 50-1000 MG TABS Take 1 tablet by mouth 2 (two) times daily. 60 tablet 6  . cholecalciferol (VITAMIN D) 1000 UNITS tablet Take 1,000 Units by mouth 2 (two) times daily.    . corticotropin (ATHACAR H.P.) 80 UNIT/ML injectable gel Inject into the muscle.    . diclofenac (VOLTAREN) 75 MG EC tablet Take 1 tablet (75 mg total) by mouth 2 (two) times daily. 60 tablet 2  . glucose blood (ACCU-CHEK AVIVA PLUS) test strip Monitor FSBS 1x daily fasting. Dx: 250.00 100 each 3  . HYDROcodone-acetaminophen (NORCO/VICODIN) 5-325 MG per tablet Take 1 tablet by mouth every 6 (six) hours as needed. 30 tablet 0  . Lancet Devices (ACCU-CHEK SOFTCLIX) lancets Monitor FSBS 1x  daily fasting. Dx: 250.00 100 each 3  . lisinopril-hydrochlorothiazide (PRINZIDE,ZESTORETIC) 10-12.5 MG per tablet TAKE 1 TABLET BY MOUTH  DAILY. BLOOD PRESSURE 30 tablet 3  . meclizine (ANTIVERT) 25 MG tablet Take 1 tablet (25 mg total) by mouth 3 (three) times daily as needed for dizziness. 30 tablet 0  . norgestimate-ethinyl estradiol (SPRINTEC 28) 0.25-35 MG-MCG tablet Take 1 tablet by mouth daily. 1 Package 11   No current facility-administered medications for this visit.    PAST MEDICAL HISTORY: Past Medical History  Diagnosis Date  . Diabetes mellitus   . Anemia   . Hypertension   . Carpal tunnel syndrome   . Multiple sclerosis   . Vision, loss, sudden     PAST SURGICAL HISTORY: Past Surgical History  Procedure Laterality Date  . Cyst excision    . Breast reduction surgery      FAMILY HISTORY: Family History  Problem Relation Age of Onset  . Cancer Mother   . Diabetes Mother   . Hearing loss Mother   . Thyroid nodules Mother   . Anemia Mother   . Kidney disease Mother   . Hypertension Mother   . Hyperlipidemia Father   . Anemia Sister   . Arthritis Maternal Grandmother   . Arthritis Maternal Grandfather   . Arthritis Paternal Grandmother   . Kidney disease Paternal Grandmother   . Hypertension Paternal Grandmother   . Arthritis Paternal Grandfather   . Vision loss Paternal Grandfather   . Stroke Paternal Grandfather   . Hyperlipidemia Paternal Grandfather   . Anemia Sister     SOCIAL HISTORY:  History   Social History  . Marital Status: Single    Spouse Name: N/A  . Number of Children: 0  . Years of Education: College   Occupational History  . unemployed    Social History Main Topics  . Smoking status: Never Smoker   . Smokeless tobacco: Never Used  . Alcohol Use: No  . Drug Use: No  . Sexual Activity: Not Currently    Birth Control/ Protection: Pill   Other Topics Concern  . Not on file   Social History Narrative   Live at home with parents.   Right handed.   Uses caffeine sparingly.     PHYSICAL EXAM   Filed Vitals:   02/08/15 1203  BP: 142/88  Pulse: 72    Height: $Remove'5\' 6"'GibYCpP$  (1.676 m)  Weight: 284 lb (128.822 kg)    Not recorded      Body mass index is 45.86 kg/(m^2).    PHYSICAL EXAMNIATION:  Gen: NAD, conversant, well nourised, obese, well groomed                     Cardiovascular: Regular rate rhythm, no peripheral edema, warm, nontender. Eyes: Conjunctivae clear without exudates or hemorrhage Neck: Supple, no carotid bruise. Pulmonary: Clear to auscultation bilaterally   NEUROLOGICAL EXAM:  MENTAL STATUS: Speech:    Speech is normal; fluent and spontaneous with normal comprehension.  Cognition:    The patient is oriented to person, place, and time;     recent and remote memory intact;     language fluent;     normal attention, concentration,     fund of knowledge.  CRANIAL NERVES: CN II: Visual fields are full to confrontation. Fundoscopic exam is normal with sharp discs and no vascular changes. Venous pulsations are present bilaterally. She has right pupil afferent defect. Visual acuity is 20/20 bilaterally. CN III, IV, VI:  extraocular movement are normal. No ptosis. CN V: Facial sensation is intact to pinprick in all 3 divisions bilaterally. Corneal responses are intact.  CN VII: Face is symmetric with normal eye closure and smile. CN VIII: Hearing is normal to rubbing fingers CN IX, X: Palate elevates symmetrically. Phonation is normal. CN XI: Head turning and shoulder shrug are intact CN XII: Tongue is midline with normal movements and no atrophy.  MOTOR: There is no pronator drift of out-stretched arms. Muscle bulk and tone are normal. Muscle strength is normal.   Shoulder abduction Shoulder external rotation Elbow flexion Elbow extension Wrist flexion Wrist extension Finger abduction Hip flexion Knee flexion Knee extension Ankle dorsi flexion Ankle plantar flexion  R $'5 5 5 5 5 5 5 5 5 5 5 5  'X$ L '5 5 5 5 5 5 5 5 5 5 5 5    '$ REFLEXES: Reflexes are 2+ and symmetric at the biceps, triceps, knees, and ankles. Plantar  responses are flexor.  SENSORY: Light touch, pinprick, position sense, and vibration sense are intact in fingers and toes.  COORDINATION: Rapid alternating movements and fine finger movements are intact. There is no dysmetria on finger-to-nose and heel-knee-shin. There are no abnormal or extraneous movements.   GAIT/STANCE: Posture is normal. Gait is steady with normal steps, base, arm swing, and turning. Heel and toe walking are normal. Tandem gait is mildly unsteady.  Romberg is absent.   DIAGNOSTIC DATA (LABS, IMAGING, TESTING) - I reviewed patient records, labs, notes, testing and imaging myself where available.  Lab Results  Component Value Date   WBC 8.6 11/02/2014   HGB 11.1* 11/02/2014   HCT 34.9* 11/02/2014   MCV 79.9 11/02/2014   PLT 367 11/02/2014      Component Value Date/Time   NA 141 11/02/2014 0944   K 5.1 11/02/2014 0944   CL 104 11/02/2014 0944   CO2 25 11/02/2014 0944   GLUCOSE 208* 11/02/2014 0944   BUN 9 11/02/2014 0944   CREATININE 0.70 11/02/2014 0944   CREATININE 0.61 10/11/2010 2308   CALCIUM 9.2 11/02/2014 0944   PROT 7.3 01/14/2015 1635   PROT 7.8 11/02/2014 0944   ALBUMIN 4.1 11/02/2014 0944   AST 15 11/02/2014 0944   ALT 22 11/02/2014 0944   ALKPHOS 79 11/02/2014 0944   BILITOT 0.5 11/02/2014 0944   Lab Results  Component Value Date   CHOL 208* 11/02/2014   HDL 66 11/02/2014   LDLCALC 119* 11/02/2014   TRIG 114 11/02/2014   CHOLHDL 3.2 11/02/2014   Lab Results  Component Value Date   HGBA1C 8.6* 11/02/2014   No results found for: [id number] Lab Results  Component Value Date   TSH 1.281 07/28/2014      ASSESSMENT AND PLAN  Katrina Montes is a 44 y.o. female  with right optic neuritis since January 2016, significant abnormal MRI findings, involving brain, and MRI cervical spine, consistent with relapsing remediating multiple sclerosis, Positive JC virus antibody, with titer of 1.13. After extensive discussion with patient, we  decided to proceed with Tysarbri IV infusion, Continue Achtar treatment for her right optic neuritis, did not responsive to previous IV Solu-Medrol infusion, worsening diabetes,  Return to clinic in one month   Marcial Pacas, M.D. Ph.D.  [facility] [address], [place], [place] [address] [phone]

## 2015-02-15 ENCOUNTER — Encounter: Payer: Self-pay | Admitting: Family Medicine

## 2015-03-04 ENCOUNTER — Telehealth: Payer: Self-pay | Admitting: *Deleted

## 2015-03-04 NOTE — Telephone Encounter (Signed)
First Tysabri infusion 03/01/15.

## 2015-03-09 ENCOUNTER — Encounter: Payer: Self-pay | Admitting: Neurology

## 2015-03-09 ENCOUNTER — Ambulatory Visit (INDEPENDENT_AMBULATORY_CARE_PROVIDER_SITE_OTHER): Payer: 59 | Admitting: Neurology

## 2015-03-09 VITALS — BP 168/95 | HR 80 | Ht 66.0 in | Wt 290.0 lb

## 2015-03-09 DIAGNOSIS — H469 Unspecified optic neuritis: Secondary | ICD-10-CM | POA: Diagnosis not present

## 2015-03-09 DIAGNOSIS — M5416 Radiculopathy, lumbar region: Secondary | ICD-10-CM | POA: Insufficient documentation

## 2015-03-09 DIAGNOSIS — G35 Multiple sclerosis: Secondary | ICD-10-CM | POA: Diagnosis not present

## 2015-03-09 MED ORDER — DULOXETINE HCL 60 MG PO CPEP: 60.0000 mg | ORAL_CAPSULE | Freq: Every day | ORAL | Status: DC

## 2015-03-09 MED ORDER — DOCUSATE SODIUM 50 MG PO CAPS: 50.0000 mg | ORAL_CAPSULE | Freq: Two times a day (BID) | ORAL | Status: DC

## 2015-03-09 NOTE — Progress Notes (Signed)
PATIENT: Katrina Montes DOB: 27-Feb-1971  HISTORICAL  Katrina Montes is 44 right-handed African-American female, accompanied by her mother, referred by her primary care physician Dr. 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,  At the beginning of 2015, she had transient right eye blurry vision, was considered due to her high glucose, recovered within a few weeks  Since January 2016, she began to experience intermittent vertigo, dizziness, also had worsening right eye blurry vision,, color washing out, could only see her rim at her right peripheral visual field, she denies significant eye pain  MRI of the brain without contrast at [facility] imaging January 2016 showed multiple periventricular white matter disease, significant involvement of corpus callosum, perpendicular to ventricle, consistent with multiple sclerosis, now was also suspicious for right optic nerve edema, with hyperintensity signal at T2, FLAIR,  She was treated with IV steroid in January 15, 18, 19, only has mild improvement of her right vision, has significant worsening of her blood glucose level.  Spinal fluid testing January 13 2015, total protein was 708, RBC was 1600, RBC was 7,, cloudy reddish-looking,  Laboratory showed anemia, hemoglobin 11.8, otherwise normal CBC, normal CMP, with exception of elevated glucose, normal TSH,  UPDATE Feb 22nd 2016: Acthar injection training today, she still has right blurry vision, mild improvement, mild unsteady gait, her diabetes is under suboptimal control,  We have reviewed MRI of the cervical spine with and without contrast showing hyperintense foci within the spinal cord posteriorly at C4 and  posteriolaterally at C7-T1 consistent with multiple sclerosis plaques. There were no enhancing foci.  There was no enhancing lesions at MRI of the brain, MRA of the brain was normal,   JC virus was positive with titer of 1.1 3 (Jan 14 2015),  laboratory evaluation showed normal or negative CPK, HIV, protein electrophoresis, positive varicella-zoster virus antibody, mild elevated ESR, C-reactive protein, hepatitis panel, Lyme titer,   Visual evoked potential showed prolongation on the right side.  UPDATE March 22nd 2016: She got first dose Tysarbril March 14th, doing well, she received 1 round of Acthar injection Feb 17th 2016, right eye vision has much improved, she complains of severe body achy pain, worsening left-sided low back pain, radiating pain to left lower extremity, recent onset worsening constipation, mild gait difficulty   REVIEW OF SYSTEMS: Full 14 system review of systems performed and notable only for fatigue, blurry vision, loss of vision, snoring, anemia, joints pain, achy muscles, headaches, numbness, dizziness, passing out, snoring, restless legs.  ALLERGIES: No Known Allergies  HOME MEDICATIONS: Current Outpatient Prescriptions  Medication Sig Dispense Refill  . Blood Glucose Monitoring Suppl (BLOOD GLUCOSE METER KIT AND SUPPLIES) Dispense based on patient and insurance preference. Use to monitor FSBS 1x daily fasting. Dx: 250.00 1 each 0  . Calcium-Magnesium-Zinc (CALCIUM-MAGNESUIUM-ZINC PO) Take by mouth.    . Canagliflozin-Metformin HCl 50-1000 MG TABS Take 1 tablet by mouth 2 (two) times daily. 60 tablet 6  . cholecalciferol (VITAMIN D) 1000 UNITS tablet Take 1,000 Units by mouth 2 (two) times daily.    . diclofenac (VOLTAREN) 75 MG EC tablet Take 1 tablet (75 mg total) by mouth 2 (two) times daily. 60 tablet 2  . glucose blood (ACCU-CHEK AVIVA  PLUS) test strip Monitor FSBS 1x daily fasting. Dx: 250.00 100 each 3  . HYDROcodone-acetaminophen (NORCO/VICODIN) 5-325 MG  per tablet Take 1 tablet by mouth every 6 (six) hours as needed. 30 tablet 0  . Lancet Devices (ACCU-CHEK SOFTCLIX) lancets Monitor FSBS 1x daily fasting. Dx: 250.00 100 each 3  . lisinopril-hydrochlorothiazide (PRINZIDE,ZESTORETIC) 10-12.5 MG per tablet TAKE 1 TABLET BY MOUTH DAILY. BLOOD PRESSURE 30 tablet 3  . meclizine (ANTIVERT) 25 MG tablet Take 1 tablet (25 mg total) by mouth 3 (three) times daily as needed for dizziness. 30 tablet 0  . natalizumab (TYSABRI) 300 MG/15ML injection Inject 300 mg into the vein. Infuse 338m IV every 28 days.    . norgestimate-ethinyl estradiol (SPRINTEC 28) 0.25-35 MG-MCG tablet Take 1 tablet by mouth daily. 1 Package 11   No current facility-administered medications for this visit.    PAST MEDICAL HISTORY: Past Medical History  Diagnosis Date  . Diabetes mellitus   . Anemia   . Hypertension   . Carpal tunnel syndrome   . Multiple sclerosis   . Vision, loss, sudden     PAST SURGICAL HISTORY: Past Surgical History  Procedure Laterality Date  . Cyst excision    . Breast reduction surgery      FAMILY HISTORY: Family History  Problem Relation Age of Onset  . Cancer Mother   . Diabetes Mother   . Hearing loss Mother   . Thyroid nodules Mother   . Anemia Mother   . Kidney disease Mother   . Hypertension Mother   . Hyperlipidemia Father   . Anemia Sister   . Arthritis Maternal Grandmother   . Arthritis Maternal Grandfather   . Arthritis Paternal Grandmother   . Kidney disease Paternal Grandmother   . Hypertension Paternal Grandmother   . Arthritis Paternal Grandfather   . Vision loss Paternal Grandfather   . Stroke Paternal Grandfather   . Hyperlipidemia Paternal Grandfather   . Anemia Sister     SOCIAL HISTORY:  History   Social History  . Marital Status: Single    Spouse Name: N/A  . Number of Children: 0  . Years of Education: College   Occupational History  . unemployed    Social History Main Topics  . Smoking  status: Never Smoker   . Smokeless tobacco: Never Used  . Alcohol Use: No  . Drug Use: No  . Sexual Activity: Not Currently    Birth Control/ Protection: Pill   Other Topics Concern  . Not on file   Social History Narrative   Live at home with parents.   Right handed.   Uses caffeine sparingly.     PHYSICAL EXAM   Filed Vitals:   03/09/15 1050  BP: 168/95  Pulse: 80  Height: 5' 6" (1.676 m)  Weight: 290 lb (131.543 kg)    Not recorded      Body mass index is 46.83 kg/(m^2).    PHYSICAL EXAMNIATION:  Gen: NAD, conversant, well nourised, obese, well groomed                     Cardiovascular: Regular rate rhythm, no peripheral edema, warm, nontender. Eyes: Conjunctivae clear without exudates or hemorrhage Neck: Supple, no carotid bruise. Pulmonary: Clear to auscultation bilaterally   NEUROLOGICAL EXAM:  MENTAL STATUS: Speech:    Speech is normal; fluent and spontaneous with normal comprehension.  Cognition:    The patient is oriented to person, place, and time;     recent and  remote memory intact;     language fluent;     normal attention, concentration,     fund of knowledge.  CRANIAL NERVES: CN II: Visual fields are full to confrontation. Fundoscopic exam is normal with sharp discs and no vascular changes. Venous pulsations are present bilaterally. She has right pupil afferent defect. Visual acuity is 20/20 bilaterally. CN III, IV, VI: extraocular movement are normal. No ptosis. CN V: Facial sensation is intact to pinprick in all 3 divisions bilaterally. Corneal responses are intact.  CN VII: Face is symmetric with normal eye closure and smile. CN VIII: Hearing is normal to rubbing fingers CN IX, X: Palate elevates symmetrically. Phonation is normal. CN XI: Head turning and shoulder shrug are intact CN XII: Tongue is midline with normal movements and no atrophy.  MOTOR: There is no pronator drift of out-stretched arms. Muscle bulk and tone are normal.  Muscle strength is normal.   Shoulder abduction Shoulder external rotation Elbow flexion Elbow extension Wrist flexion Wrist extension Finger abduction Hip flexion Knee flexion Knee extension Ankle dorsi flexion Ankle plantar flexion  R _0 L _1 REFLEXES: Reflexes are 2+ and symmetric at the biceps, triceps, knees, and ankles. Plantar responses are flexor.  SENSORY: Light touch, pinprick, position sense, and vibration sense are intact in fingers and toes.  COORDINATION: Rapid alternating movements and fine finger movements are intact. There is no dysmetria on finger-to-nose and heel-knee-shin. There are no abnormal or extraneous movements.   GAIT/STANCE: Posture is normal. Gait is steady with normal steps, base, arm swing, and turning. Heel and toe walking are normal. Tandem gait is mildly unsteady.  Romberg is absent.   DIAGNOSTIC DATA (LABS, IMAGING, TESTING) - I reviewed patient records, labs, notes, testing and imaging myself where available.  Lab Results  Component Value Date   WBC 8.6 11/02/2014   HGB 11.1* 11/02/2014   HCT 34.9* 11/02/2014   MCV 79.9 11/02/2014   PLT 367 11/02/2014      Component Value Date/Time   NA 141 11/02/2014 0944   K 5.1 11/02/2014 0944   CL 104 11/02/2014 0944   CO2 25 11/02/2014 0944   GLUCOSE 208* 11/02/2014 0944   BUN 9 11/02/2014 0944   CREATININE 0.70 11/02/2014 0944   CREATININE 0.61 10/11/2010 2308   CALCIUM 9.2 11/02/2014 0944   PROT 7.3 01/14/2015 1635   PROT 7.8 11/02/2014 0944   ALBUMIN 4.1 11/02/2014 0944   AST 15 11/02/2014 0944   ALT 22 11/02/2014 0944   ALKPHOS 79 11/02/2014 0944   BILITOT 0.5 11/02/2014 0944   Lab Results  Component Value Date   CHOL 208* 11/02/2014   HDL 66 11/02/2014   LDLCALC 119* 11/02/2014   TRIG 114 11/02/2014   CHOLHDL 3.2 11/02/2014   Lab Results  Component Value Date   HGBA1C 8.6* 11/02/2014   No results found for: [id number] Lab  Results  Component Value Date   TSH 1.281 07/28/2014      ASSESSMENT AND PLAN  Braden Brodhead is a 44 y.o. female  with right optic neuritis since January 2016, significant abnormal MRI findings, involving brain, and MRI cervical spine, consistent with relapsing remediating multiple sclerosis, Positive JC virus antibody, with titer of 1.13. After extensive discussion with patient, she received her first dose Tysarbri IV infusion in March 01 2015, tolerating it well,, Right vision  has much improved after Achtar treatment, today's visual acuity is 20/50 -1 Cymbalta 60 mg for diffuse body achy pain RTC in one month  Marcial Pacas, M.D. Ph.D.  [facility] [address], [place], [place] [address] [phone]

## 2015-03-12 ENCOUNTER — Telehealth: Payer: Self-pay | Admitting: Family Medicine

## 2015-03-12 NOTE — Telephone Encounter (Signed)
Patient is calling to let you know that she sees [facility], and with her insurance will need a referral each time she goes and had appt this week with them, please call her back at [phone]

## 2015-03-15 ENCOUNTER — Telehealth: Payer: Self-pay | Admitting: *Deleted

## 2015-03-15 NOTE — Telephone Encounter (Signed)
Pt called back and has changed her PCP card to [place] and I have submitted referral thru [facility] compass with Referral number [id number]

## 2015-03-15 NOTE — Telephone Encounter (Signed)
Pt insurance card has PCP required, I called pt to inform her that I can not submit the referral until this is put into our name and once that has been taking care of to call me and I will submit at that time. Pending call back from pt.

## 2015-03-15 NOTE — Telephone Encounter (Signed)
Submitted referral thru [facility] compass to Dr. [facility] with referral number [id number]  Type of referral: Consult and treat  Number of visits: 6  Start Date: 03/10/15- end date: 09/10/15  Dx code:G35-Multiple Sclerosic                M54.16-Radiolopathy,lumbar region  Referral has also been faxed to [facility] for records

## 2015-03-24 ENCOUNTER — Telehealth: Payer: Self-pay | Admitting: Neurology

## 2015-03-24 MED ORDER — RIZATRIPTAN BENZOATE 10 MG PO TBDP: 10.0000 mg | ORAL_TABLET | ORAL | Status: DC | PRN

## 2015-03-24 NOTE — Telephone Encounter (Signed)
Pt is calling wanting to know what she can take her head in the back of her eye, she states that the extra strength tylenol is not working.  Please call and advise.

## 2015-03-24 NOTE — Telephone Encounter (Signed)
Spoke to [employer] - she is agreeable to Maxalt.  She will call back with any further concerns.

## 2015-03-30 ENCOUNTER — Ambulatory Visit (INDEPENDENT_AMBULATORY_CARE_PROVIDER_SITE_OTHER): Payer: 59 | Admitting: Neurology

## 2015-03-30 ENCOUNTER — Encounter: Payer: Self-pay | Admitting: Neurology

## 2015-03-30 DIAGNOSIS — G43009 Migraine without aura, not intractable, without status migrainosus: Secondary | ICD-10-CM | POA: Diagnosis not present

## 2015-03-30 DIAGNOSIS — R42 Dizziness and giddiness: Secondary | ICD-10-CM | POA: Insufficient documentation

## 2015-03-30 NOTE — Progress Notes (Signed)
PATIENT: Katrina Montes DOB: 30-Dec-1970  HISTORICAL  Katrina Montes is 44 right-handed African-American female, accompanied by her mother, referred by her primary care physician Dr. 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,  At the beginning of 2015, she had transient right eye blurry vision, was considered due to her high glucose, recovered within a few weeks  Since January 2016, she began to experience intermittent vertigo, dizziness, also had worsening right eye blurry vision,, color washing out, could only see her rim at her right peripheral visual field, she denies significant eye pain  MRI of the brain without contrast at [facility] imaging January 2016 showed multiple periventricular white matter disease, significant involvement of corpus callosum, perpendicular to ventricle, consistent with multiple sclerosis, now was also suspicious for right optic nerve edema, with hyperintensity signal at T2, FLAIR,  She was treated with IV steroid in January 15, 18, 19, only has mild improvement of her right vision, has significant worsening of her blood glucose level.  Spinal fluid testing January 13 2015, total protein was 708, RBC was 1600, RBC was 7,, cloudy reddish-looking,  Laboratory showed anemia, hemoglobin 11.8, otherwise normal CBC, normal CMP, with exception of elevated glucose, normal TSH,  UPDATE Feb 22nd 2016: Acthar injection training today, she still has right blurry vision, mild improvement, mild unsteady gait, her diabetes is under suboptimal control,  We have reviewed MRI of the cervical spine with and without contrast showing hyperintense foci within the spinal cord posteriorly at C4 and  posteriolaterally at C7-T1 consistent with multiple sclerosis plaques. There were no enhancing foci.  There was no enhancing lesions at MRI of the brain, MRA of the brain was normal,   JC virus was positive with titer of 1.1 3 (Jan 14 2015),  laboratory evaluation showed normal or negative CPK, HIV, protein electrophoresis, positive varicella-zoster virus antibody, mild elevated ESR, C-reactive protein, hepatitis panel, Lyme titer,   Visual evoked potential showed prolongation on the right side.  UPDATE March 22nd 2016: She got first dose Tysarbril March 14th, doing well, she received 1 round of Acthar injection Feb 17th 2016, right eye vision has much improved, she complains of severe body achy pain, worsening left-sided low back pain, radiating pain to left lower extremity, recent onset worsening constipation, mild gait difficulty  UPDATE March 30 2015: I met patient at IV infusion suites today, she is tolerating Tysarbri IV infusion, no significant side effect noticed, no flareup of her MS symptoms,  She has been complaining worsening headache over the past 1 months, lateralized severe pounding headache with associated light noise sensitivity, I have called in Maxalt, which help her severe headaches, in addition, she has mild bilateral frontal pounding headaches, happen frequently  REVIEW OF SYSTEMS: Full 14 system review of systems performed and notable only for frequent headaches   ALLERGIES: No Known Allergies  HOME MEDICATIONS: Current Outpatient Prescriptions  Medication Sig Dispense Refill  . Blood Glucose Monitoring Suppl (BLOOD GLUCOSE METER KIT AND SUPPLIES) Dispense based on patient and insurance preference. Use to monitor FSBS 1x daily fasting. Dx: 250.00 1 each 0  . Calcium-Magnesium-Zinc (CALCIUM-MAGNESUIUM-ZINC PO) Take by mouth.    . Canagliflozin-Metformin HCl 50-1000 MG TABS Take 1 tablet by mouth 2 (  two) times daily. 60 tablet 6  . cholecalciferol (VITAMIN D) 1000  UNITS tablet Take 1,000 Units by mouth 2 (two) times daily.    . diclofenac (VOLTAREN) 75 MG EC tablet Take 1 tablet (75 mg total) by mouth 2 (two) times daily. 60 tablet 2  . docusate sodium (COLACE) 50 MG capsule Take 1 capsule (50 mg total) by mouth 2 (two) times daily. 60 capsule 11  . DULoxetine (CYMBALTA) 60 MG capsule Take 1 capsule (60 mg total) by mouth daily. 30 capsule 11  . glucose blood (ACCU-CHEK AVIVA PLUS) test strip Monitor FSBS 1x daily fasting. Dx: 250.00 100 each 3  . HYDROcodone-acetaminophen (NORCO/VICODIN) 5-325 MG per tablet Take 1 tablet by mouth every 6 (six) hours as needed. 30 tablet 0  . Lancet Devices (ACCU-CHEK SOFTCLIX) lancets Monitor FSBS 1x daily fasting. Dx: 250.00 100 each 3  . lisinopril-hydrochlorothiazide (PRINZIDE,ZESTORETIC) 10-12.5 MG per tablet TAKE 1 TABLET BY MOUTH DAILY. BLOOD PRESSURE 30 tablet 3  . meclizine (ANTIVERT) 25 MG tablet Take 1 tablet (25 mg total) by mouth 3 (three) times daily as needed for dizziness. 30 tablet 0  . natalizumab (TYSABRI) 300 MG/15ML injection Inject 300 mg into the vein. Infuse $RemoveBefo'300mg'[id number]$  IV every 28 days.    . norgestimate-ethinyl estradiol (SPRINTEC 28) 0.25-35 MG-MCG tablet Take 1 tablet by mouth daily. 1 Package 11  . rizatriptan (MAXALT-MLT) 10 MG disintegrating tablet Take 1 tablet (10 mg total) by mouth as needed. May repeat in 2 hours if needed 15 tablet 6   No current facility-administered medications for this visit.    PAST MEDICAL HISTORY: Past Medical History  Diagnosis Date  . Diabetes mellitus   . Anemia   . Hypertension   . Carpal tunnel syndrome   . Multiple sclerosis   . Vision, loss, sudden     PAST SURGICAL HISTORY: Past Surgical History  Procedure Laterality Date  . Cyst excision    . Breast reduction surgery      FAMILY HISTORY: Family History  Problem Relation Age of Onset  . Cancer Mother   . Diabetes Mother   . Hearing loss Mother   . Thyroid nodules Mother   . Anemia Mother   .  Kidney disease Mother   . Hypertension Mother   . Hyperlipidemia Father   . Anemia Sister   . Arthritis Maternal Grandmother   . Arthritis Maternal Grandfather   . Arthritis Paternal Grandmother   . Kidney disease Paternal Grandmother   . Hypertension Paternal Grandmother   . Arthritis Paternal Grandfather   . Vision loss Paternal Grandfather   . Stroke Paternal Grandfather   . Hyperlipidemia Paternal Grandfather   . Anemia Sister     SOCIAL HISTORY:  History   Social History  . Marital Status: Single    Spouse Name: N/A  . Number of Children: 0  . Years of Education: College   Occupational History  . unemployed    Social History Main Topics  . Smoking status: Never Smoker   . Smokeless tobacco: Never Used  . Alcohol Use: No  . Drug Use: No  . Sexual Activity: Not Currently    Birth Control/ Protection: Pill   Other Topics Concern  . Not on file   Social History Narrative   Live at home with parents.   Right handed.   Uses caffeine sparingly.     PHYSICAL EXAM   There were no vitals filed for this visit.  Not recorded  There is no weight on file to calculate BMI.    PHYSICAL EXAMNIATION:  Gen: NAD, conversant, well nourised, obese, well groomed                     Cardiovascular: Regular rate rhythm, no peripheral edema, warm, nontender. Eyes: Conjunctivae clear without exudates or hemorrhage Neck: Supple, no carotid bruise. Pulmonary: Clear to auscultation bilaterally   NEUROLOGICAL EXAM:  MENTAL STATUS: Speech:    Speech is normal; fluent and spontaneous with normal comprehension.  Cognition:    The patient is oriented to person, place, and time;     recent and remote memory intact;     language fluent;     normal attention, concentration,     fund of knowledge.  CRANIAL NERVES: CN II: Visual fields are full to confrontation. Fundoscopic exam is normal with sharp discs and no vascular changes. Venous pulsations are present  bilaterally. She has right pupil afferent defect.  CN III, IV, VI: extraocular movement are normal. No ptosis. CN V: Facial sensation is intact to pinprick in all 3 divisions bilaterally. Corneal responses are intact.  CN VII: Face is symmetric with normal eye closure and smile. CN VIII: Hearing is normal to rubbing fingers CN IX, X: Palate elevates symmetrically. Phonation is normal. CN XI: Head turning and shoulder shrug are intact CN XII: Tongue is midline with normal movements and no atrophy.  MOTOR: There is no pronator drift of out-stretched arms. Muscle bulk and tone are normal. Muscle strength is normal.   Shoulder abduction Shoulder external rotation Elbow flexion Elbow extension Wrist flexion Wrist extension Finger abduction Hip flexion Knee flexion Knee extension Ankle dorsi flexion Ankle plantar flexion  R $'5 5 5 5 5 5 5 5 5 5 5 5  'R$ L '5 5 5 5 5 5 5 5 5 5 5 5    '$ REFLEXES: Reflexes are 2+ and symmetric at the biceps, triceps, knees, and ankles. Plantar responses are flexor.  SENSORY: Light touch, pinprick, position sense, and vibration sense are intact in fingers and toes.  COORDINATION: Rapid alternating movements and fine finger movements are intact. There is no dysmetria on finger-to-nose and heel-knee-shin. There are no abnormal or extraneous movements.   GAIT/STANCE: Posture is normal. Gait is steady with normal steps, base, arm swing, and turning. Heel and toe walking are normal. Tandem gait is mildly unsteady.  Romberg is absent.   DIAGNOSTIC DATA (LABS, IMAGING, TESTING) - I reviewed patient records, labs, notes, testing and imaging myself where available.  Lab Results  Component Value Date   WBC 8.6 11/02/2014   HGB 11.1* 11/02/2014   HCT 34.9* 11/02/2014   MCV 79.9 11/02/2014   PLT 367 11/02/2014      Component Value Date/Time   NA 141 11/02/2014 0944   K 5.1 11/02/2014 0944   CL 104 11/02/2014 0944   CO2 25 11/02/2014 0944   GLUCOSE 208* 11/02/2014  0944   BUN 9 11/02/2014 0944   CREATININE 0.70 11/02/2014 0944   CREATININE 0.61 10/11/2010 2308   CALCIUM 9.2 11/02/2014 0944   PROT 7.3 01/14/2015 1635   PROT 7.8 11/02/2014 0944   ALBUMIN 4.1 11/02/2014 0944   AST 15 11/02/2014 0944   ALT 22 11/02/2014 0944   ALKPHOS 79 11/02/2014 0944   BILITOT 0.5 11/02/2014 0944   Lab Results  Component Value Date   CHOL 208* 11/02/2014   HDL 66 11/02/2014   LDLCALC 119* 11/02/2014   TRIG 114 11/02/2014  CHOLHDL 3.2 11/02/2014   Lab Results  Component Value Date   HGBA1C 8.6* 11/02/2014   No results found for: [id number] Lab Results  Component Value Date   TSH 1.281 07/28/2014      ASSESSMENT AND PLAN  Katrina Montes is a 44 y.o. female  with right optic neuritis since January 2016, significant abnormal MRI findings, involving brain, and MRI cervical spine, consistent with relapsing remediating multiple sclerosis, Positive JC virus antibody, with titer of 1.13. She has received her first dose Tysarbri IV infusion in March 01 2015, tolerating it well,, Right vision has much improved after Achtar treatment  Cymbalta 60 mg for diffuse body achy pain  Her headache is most consistent with migraine headaches, which helped by Maxalt, advised to document headache frequency, if needed, may consider Topamax as preventive medications  Marcial Pacas, M.D. Ph.D.  [facility] [address], [place], [place] [address] [phone]

## 2015-04-06 DIAGNOSIS — Z0289 Encounter for other administrative examinations: Secondary | ICD-10-CM

## 2015-04-16 ENCOUNTER — Other Ambulatory Visit: Payer: Self-pay | Admitting: Family Medicine

## 2015-04-16 NOTE — Telephone Encounter (Signed)
Medication refilled per protocol. 

## 2015-04-20 DIAGNOSIS — Z0289 Encounter for other administrative examinations: Secondary | ICD-10-CM

## 2015-05-14 ENCOUNTER — Telehealth: Payer: Self-pay | Admitting: Family Medicine

## 2015-05-14 NOTE — Telephone Encounter (Signed)
Patient calling to say that she get sinus infection one time per year and would like to know if dr Jeanice Lim will call in something for her without coming in for this  [phone] [facility] [address]

## 2015-05-14 NOTE — Telephone Encounter (Signed)
Patient needs to be seen.

## 2015-06-11 DIAGNOSIS — Z0289 Encounter for other administrative examinations: Secondary | ICD-10-CM

## 2015-06-17 ENCOUNTER — Ambulatory Visit (INDEPENDENT_AMBULATORY_CARE_PROVIDER_SITE_OTHER): Payer: 59 | Admitting: Neurology

## 2015-06-17 ENCOUNTER — Encounter: Payer: Self-pay | Admitting: Neurology

## 2015-06-17 VITALS — BP 168/107 | HR 91 | Ht 66.0 in | Wt 285.0 lb

## 2015-06-17 DIAGNOSIS — G43009 Migraine without aura, not intractable, without status migrainosus: Secondary | ICD-10-CM

## 2015-06-17 DIAGNOSIS — H469 Unspecified optic neuritis: Secondary | ICD-10-CM

## 2015-06-17 DIAGNOSIS — G35 Multiple sclerosis: Secondary | ICD-10-CM | POA: Diagnosis not present

## 2015-06-17 MED ORDER — TOPIRAMATE 100 MG PO TABS: 100.0000 mg | ORAL_TABLET | Freq: Two times a day (BID) | ORAL | Status: DC

## 2015-06-17 MED ORDER — BACLOFEN 10 MG PO TABS: 10.0000 mg | ORAL_TABLET | Freq: Three times a day (TID) | ORAL | Status: DC

## 2015-06-17 MED ORDER — TRAZODONE HCL 50 MG PO TABS: 50.0000 mg | ORAL_TABLET | Freq: Every day | ORAL | Status: DC

## 2015-06-17 NOTE — Progress Notes (Signed)
Chief Complaint  Patient presents with  . Multiple Sclerosis    She is experiencing excessive daytime somnolence.  She is having muscle spasms primarily in her left foot.  Also, says she has started to stutter when speaking.  . Migraine    She has been having an increase in headaches over the last three weeks.      PATIENT: Katrina Montes DOB: 10-09-1971  HISTORICAL (Initial visit Jan 2016)  Katrina Montes is 44 right-handed African-American female, accompanied by her mother, referred by her primary care physician Dr. 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,  At the beginning of 2015, she had transient right eye blurry vision, was considered due to her high glucose, recovered within a few weeks  Since January 2016, she began to experience intermittent vertigo, dizziness, also had worsening right eye blurry vision,, color washing out, could only see her rim at her right peripheral visual field, she denies significant eye pain  MRI of the brain without contrast at [facility] imaging January 2016 showed multiple periventricular white matter disease, significant involvement of corpus callosum, perpendicular to ventricle, consistent with multiple sclerosis, now was also suspicious for right optic nerve edema, with hyperintensity signal at T2, FLAIR,  She was treated with IV steroid in January 15, 18, 19, only has mild improvement of her right vision, has significant worsening of her blood glucose level.  Spinal fluid testing January 13 2015, total protein was 708, RBC was 1600, RBC was 7,, cloudy reddish-looking,  Laboratory showed anemia, hemoglobin 11.8, otherwise normal CBC, normal CMP, with exception of elevated  glucose, normal TSH,  UPDATE Feb 22nd 2016: Acthar injection training today, she still has right blurry vision, mild improvement, mild unsteady gait, her diabetes is under suboptimal control,  We have reviewed MRI of the cervical spine with and without contrast in Jan 2016: showing hyperintense foci within the spinal cord posteriorly at C4 and posteriolaterally at C7-T1 consistent with multiple sclerosis plaques. There were no enhancing foci.  There was no enhancing lesions at MRI of the brain, MRA of the brain was normal,   JC virus was positive with titer of 1.1 3 (Jan 14 2015),  laboratory evaluation showed normal or negative CPK, HIV, protein electrophoresis, positive varicella-zoster virus antibody, mild elevated ESR, C-reactive protein, hepatitis panel, Lyme titer,   Visual evoked potential showed prolongation on the right side.  UPDATE March 22nd 2016: She got first dose Tysarbril March 14th, 2016 doing well, she received 1 round of Acthar injection Feb 17th 2016, right eye vision has much improved, she complains of severe body achy pain, worsening left-sided low back pain, radiating pain to left lower extremity, recent onset worsening constipation, mild gait difficulty  UPDATE March 30 2015: I met patient at IV infusion suites today, she is tolerating Tysarbri IV infusion, no significant side effect noticed, no flareup of her MS symptoms,  She has been complaining worsening headache over the past 1 months, lateralized severe pounding headache with associated light noise sensitivity, I have called in Maxalt, which help her severe headaches, in addition, she has mild bilateral frontal pounding headaches, happen frequently  UPDATE June 17 2015:  She tolerated to Tysabri well, she continue complains of mild blurry vision,  gait difficulty, bilateral lower extremity spasticity, muscle tightness, left foot muscle cramping She still has frequent headaches 3-4 times each week , Maxalt works  well, she also complains of excessive fatigue, worsening by hot weather, depression anxiety, word finding difficulties, stuttering of speech, Insomnia  REVIEW OF SYSTEMS: Full 14 system review of systems performed and notable only for frequent headaches, fatigue, excessive sweating, loss of vision, blurry vision, heat intolerance, restless leg, insomnia, weight gain, frequent awakening, daytime sleepiness, joints pain, achy muscles, muscle cramps, itching, memory loss, dizziness, headaches, speech difficulty, depression anxiety  ALLERGIES: No Known Allergies  HOME MEDICATIONS: Current Outpatient Prescriptions  Medication Sig Dispense Refill  . Blood Glucose Monitoring Suppl (BLOOD GLUCOSE METER KIT AND SUPPLIES) Dispense based on patient and insurance preference. Use to monitor FSBS 1x daily fasting. Dx: 250.00 1 each 0  . Calcium-Magnesium-Zinc (CALCIUM-MAGNESUIUM-ZINC PO) Take by mouth.    . Canagliflozin-Metformin HCl 50-1000 MG TABS Take 1 tablet by mouth 2 (two) times daily. 60 tablet 6  . cholecalciferol (VITAMIN D) 1000 UNITS tablet Take 1,000 Units by mouth 2 (two) times daily.    . diclofenac (VOLTAREN) 75 MG EC tablet TAKE 1 TABLET (75 MG TOTAL) BY MOUTH 2 (TWO) TIMES DAILY. 60 tablet 1  . docusate sodium (COLACE) 50 MG capsule Take 1 capsule (50 mg total) by mouth 2 (two) times daily. 60 capsule 11  . DULoxetine (CYMBALTA) 60 MG capsule Take 1 capsule (60 mg total) by mouth daily. 30 capsule 11  . glucose blood (ACCU-CHEK AVIVA PLUS) test strip Monitor FSBS 1x daily fasting. Dx: 250.00 100 each 3  . HYDROcodone-acetaminophen (NORCO/VICODIN) 5-325 MG per tablet Take 1 tablet by mouth every 6 (six) hours as needed. 30 tablet 0  . Lancet Devices (ACCU-CHEK SOFTCLIX) lancets Monitor FSBS 1x daily fasting. Dx: 250.00 100 each 3  . lisinopril-hydrochlorothiazide (PRINZIDE,ZESTORETIC) 10-12.5 MG per tablet TAKE 1 TABLET BY MOUTH DAILY. BLOOD PRESSURE 30 tablet 3  . meclizine (ANTIVERT) 25 MG  tablet Take 1 tablet (25 mg total) by mouth 3 (three) times daily as needed for dizziness. 30 tablet 0  . natalizumab (TYSABRI) 300 MG/15ML injection Inject 300 mg into the vein. Infuse 376m IV every 28 days.    . norgestimate-ethinyl estradiol (SPRINTEC 28) 0.25-35 MG-MCG tablet Take 1 tablet by mouth daily. 1 Package 11  . rizatriptan (MAXALT-MLT) 10 MG disintegrating tablet Take 1 tablet (10 mg total) by mouth as needed. May repeat in 2 hours if needed 15 tablet 6   No current facility-administered medications for this visit.    PAST MEDICAL HISTORY: Past Medical History  Diagnosis Date  . Diabetes mellitus   . Anemia   . Hypertension   . Carpal tunnel syndrome   . Multiple sclerosis   . Vision, loss, sudden     PAST SURGICAL HISTORY: Past Surgical History  Procedure Laterality Date  . Cyst excision    . Breast reduction surgery      FAMILY HISTORY: Family History  Problem Relation Age of Onset  . Cancer Mother   . Diabetes Mother   . Hearing loss Mother   . Thyroid nodules Mother   . Anemia Mother   . Montes disease Mother   . Hypertension Mother   . Hyperlipidemia Father   . Anemia Sister   . Arthritis Maternal Grandmother   . Arthritis Maternal Grandfather   . Arthritis Paternal Grandmother   . Montes disease Paternal Grandmother   . Hypertension Paternal Grandmother   . Arthritis Paternal  Grandfather   . Vision loss Paternal Grandfather   . Stroke Paternal Grandfather   . Hyperlipidemia Paternal Grandfather   . Anemia Sister     SOCIAL HISTORY:  History   Social History  . Marital Status: Single    Spouse Name: N/A  . Number of Children: 0  . Years of Education: College   Occupational History  . unemployed    Social History Main Topics  . Smoking status: Never Smoker   . Smokeless tobacco: Never Used  . Alcohol Use: No  . Drug Use: No  . Sexual Activity: Not Currently    Birth Control/ Protection: Pill   Other Topics Concern  . Not on  file   Social History Narrative   Live at home with parents.   Right handed.   Uses caffeine sparingly.     PHYSICAL EXAM   Filed Vitals:   06/17/15 1016  BP: 168/107  Pulse: 91  Height: 5' 6" (1.676 m)  Weight: 285 lb (129.275 kg)    Not recorded      Body mass index is 46.02 kg/(m^2).    PHYSICAL EXAMNIATION:  Gen: NAD, conversant, well nourised, obese, well groomed                     Cardiovascular: Regular rate rhythm, no peripheral edema, warm, nontender. Eyes: Conjunctivae clear without exudates or hemorrhage Neck: Supple, no carotid bruise. Pulmonary: Clear to auscultation bilaterally   NEUROLOGICAL EXAM:  MENTAL STATUS: Speech:    Speech is normal; fluent and spontaneous with normal comprehension.  Cognition:    The patient is oriented to person, place, and time;     recent and remote memory intact;     language fluent;     normal attention, concentration,     fund of knowledge.  CRANIAL NERVES: CN II: Visual fields are full to confrontation. Fundoscopic exam is normal with sharp discs and no vascular changes. Pupils were equal round reactive to light CN III, IV, VI: extraocular movement are normal. No ptosis. CN V: Facial sensation is intact to pinprick in all 3 divisions bilaterally. Corneal responses are intact.  CN VII: Face is symmetric with normal eye closure and smile. CN VIII: Hearing is normal to rubbing fingers CN IX, X: Palate elevates symmetrically. Phonation is normal. CN XI: Head turning and shoulder shrug are intact CN XII: Tongue is midline with normal movements and no atrophy.  MOTOR: She has mild bilateral lower extremity spasticity, no significant weakness, normal upper extremity bulk and strength.   REFLEXES: Reflexes are 2+ and symmetric at the biceps, triceps, 3/3 knees, and ankles. Plantar responses are flexor.  SENSORY: Light touch, pinprick, position sense, and vibration sense are intact in fingers and  toes.  COORDINATION: Rapid alternating movements and fine finger movements are intact. There is no dysmetria on finger-to-nose and heel-knee-shin. There are no abnormal or extraneous movements.   GAIT/STANCE: Mildly stiff, cautious, difficulty with tandem walking   DIAGNOSTIC DATA (LABS, IMAGING, TESTING) - I reviewed patient records, labs, notes, testing and imaging myself where available.  Lab Results  Component Value Date   WBC 8.6 11/02/2014   HGB 11.1* 11/02/2014   HCT 34.9* 11/02/2014   MCV 79.9 11/02/2014   PLT 367 11/02/2014      Component Value Date/Time   NA 141 11/02/2014 0944   K 5.1 11/02/2014 0944   CL 104 11/02/2014 0944   CO2 25 11/02/2014 0944   GLUCOSE 208* 11/02/2014 7829  BUN 9 11/02/2014 0944   CREATININE 0.70 11/02/2014 0944   CREATININE 0.61 10/11/2010 2308   CALCIUM 9.2 11/02/2014 0944   PROT 7.3 01/14/2015 1635   PROT 7.8 11/02/2014 0944   ALBUMIN 4.1 11/02/2014 0944   AST 15 11/02/2014 0944   ALT 22 11/02/2014 0944   ALKPHOS 79 11/02/2014 0944   BILITOT 0.5 11/02/2014 0944   Lab Results  Component Value Date   CHOL 208* 11/02/2014   HDL 66 11/02/2014   LDLCALC 119* 11/02/2014   TRIG 114 11/02/2014   CHOLHDL 3.2 11/02/2014   Lab Results  Component Value Date   HGBA1C 8.6* 11/02/2014   No results found for: [id number] Lab Results  Component Value Date   TSH 1.281 07/28/2014     Radiology: MRI brain in Jan 2016: Moderately advanced predominantly periventricular white matter signal abnormality with significant involvement of the corpus callosum. Some lesions are radially arranged perpendicular to that structure. Some lesions have not ovoid configuration. There is T2 and FLAIR hyperintensity of callosal septal interface. Brainstem involvement is observed in the BILATERAL LEFT greater than RIGHT cerebral peduncles MRI cervical in Feb 2nd 2016: This is an abnormal MRI of the cervical spine with and without contrast showing hyperintense  foci within the spinal cord posteriorly at C4 and posteriolaterally at C7-T1 consistent with multiple sclerosis plaques. There were no enhancing foci. CSF study in January 2016, elevated IgG index, negative oligoclonal banding JC virus-Ab:  positive with titer of 1.1 3 (Jan 14 2015) Treatment: Tysabri infusion since March 01 2015  ASSESSMENT AND PLAN  Elizette Guthridge is a 44 y.o. female  with relapsing remitting multiple sclerosis, presented with gait difficulty, right optic neuritis since January 2016, diagnosis was confirmed by  abnormal MRI findings,she has lesions at cervical spine,  Positive JC virus antibody, with titer of 1.13 in January 2016. She has received her first dose Tysarbri IV infusion in March 01 2015, tolerating it well,, Right vision has much improved after Achtar treatment  1, relapsing remitting multiple sclerosis, continue with Tysarbri infusion  2, migraine headaches, Topamax as preventive medications, Maxalt as needed  3, depression anxiety, continue Cymbalta 60 mg daily, add on trazodone 50 mg every night  4. Started baclofen 10 mg 3 times a day for bilateral lower extremity spasticity, she is interested in extended release baclofen trial, information was provided   Katrina Montes, M.D. Ph.D.  [facility] [address] [place], [place] [address] [phone]

## 2015-06-18 LAB — COMPREHENSIVE METABOLIC PANEL
A/G RATIO: 1.5 (ref 1.1–2.5)
ALT: 11 IU/L (ref 0–32)
AST: 10 IU/L (ref 0–40)
Albumin: 4.1 g/dL (ref 3.5–5.5)
Alkaline Phosphatase: 61 IU/L (ref 39–117)
BUN / CREAT RATIO: 14 (ref 9–23)
BUN: 9 mg/dL (ref 6–24)
Bilirubin Total: 0.3 mg/dL (ref 0.0–1.2)
CHLORIDE: 100 mmol/L (ref 97–108)
CO2: 22 mmol/L (ref 18–29)
Calcium: 9.4 mg/dL (ref 8.7–10.2)
Creatinine, Ser: 0.66 mg/dL (ref 0.57–1.00)
GFR calc non Af Amer: 109 mL/min/{1.73_m2} (ref >59)
GFR, EST AFRICAN AMERICAN: 126 mL/min/{1.73_m2} (ref >59)
GLOBULIN, TOTAL: 2.7 g/dL (ref 1.5–4.5)
GLUCOSE: 235 mg/dL — AB (ref 65–99)
Potassium: 4.4 mmol/L (ref 3.5–5.2)
SODIUM: 138 mmol/L (ref 134–144)
Total Protein: 6.8 g/dL (ref 6.0–8.5)

## 2015-06-18 LAB — CBC
HEMATOCRIT: 35.2 % (ref 34.0–46.6)
Hemoglobin: 11.1 g/dL (ref 11.1–15.9)
MCH: 24.2 pg — AB (ref 26.6–33.0)
MCHC: 31.5 g/dL (ref 31.5–35.7)
MCV: 77 fL — AB (ref 79–97)
Platelets: 343 10*3/uL (ref 150–379)
RBC: 4.59 x10E6/uL (ref 3.77–5.28)
RDW: 18.3 % — ABNORMAL HIGH (ref 12.3–15.4)
WBC: 9.6 10*3/uL (ref 3.4–10.8)

## 2015-06-18 LAB — HGB A1C W/O EAG: Hgb A1c MFr Bld: 7.7 % — ABNORMAL HIGH (ref 4.8–5.6)

## 2015-06-22 ENCOUNTER — Telehealth: Payer: Self-pay | Admitting: Neurology

## 2015-06-22 NOTE — Telephone Encounter (Signed)
Please call patient, lab showed elevated Glucose, she should optimize her DM control

## 2015-06-23 NOTE — Telephone Encounter (Signed)
Instructed her to contact the MD treating her diabetes for an appointment to discuss.

## 2015-06-23 NOTE — Telephone Encounter (Signed)
Notified patient of results 

## 2015-06-24 ENCOUNTER — Telehealth: Payer: Self-pay | Admitting: *Deleted

## 2015-06-24 NOTE — Telephone Encounter (Signed)
JCV results (collected on 06/17/15):  0.94 H positive

## 2015-07-21 ENCOUNTER — Ambulatory Visit (INDEPENDENT_AMBULATORY_CARE_PROVIDER_SITE_OTHER): Payer: Self-pay | Admitting: Neurology

## 2015-07-21 DIAGNOSIS — Z0289 Encounter for other administrative examinations: Secondary | ICD-10-CM

## 2015-07-21 DIAGNOSIS — R252 Cramp and spasm: Secondary | ICD-10-CM

## 2015-07-21 DIAGNOSIS — G43009 Migraine without aura, not intractable, without status migrainosus: Secondary | ICD-10-CM

## 2015-07-21 DIAGNOSIS — G35 Multiple sclerosis: Secondary | ICD-10-CM

## 2015-07-21 NOTE — Progress Notes (Signed)
No chief complaint on file.     PATIENT: Katrina Montes DOB: December 16, 1971  HISTORICAL (Initial visit Jan 2016)  Katrina Montes is 44 right-handed African-American female, accompanied by her mother, referred by her primary care physician Dr. 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,  In May of 2015, she had transient right eye blurry vision, was considered due to her high glucose, recovered within a few weeks  Since January 2016, she began to experience intermittent vertigo, dizziness, also had worsening right eye blurry vision,, color washing out, could only see her rim at her right peripheral visual field, she denies significant eye pain  MRI of the brain without contrast at [facility] January 2016 showed multiple periventricular white matter disease, significant involvement of corpus callosum, perpendicular to ventricle, consistent with multiple sclerosis, now was also suspicious for right optic nerve edema, with hyperintensity signal at T2, FLAIR,  She was treated with IV steroid in January 15, 18, 19, only has mild improvement of her right vision, has significant worsening of her blood glucose level.  Spinal fluid testing January 13 2015, total protein was 708, RBC was 1600, RBC was 7,, cloudy reddish-looking,  Laboratory showed anemia, hemoglobin 11.8, otherwise normal CBC, normal CMP, with exception of elevated glucose, normal TSH,  UPDATE Feb 22nd 2016: Acthar injection training today, she still has right blurry vision, mild improvement, mild unsteady gait, her diabetes is under suboptimal control,  We have reviewed MRI of the cervical spine with and without contrast in Jan 2016: showing hyperintense foci  within the spinal cord posteriorly at C4 and posteriolaterally at C7-T1 consistent with multiple sclerosis plaques. There were no enhancing foci.  There was no enhancing lesions at MRI of the brain, MRA of the brain was normal,   JC virus was positive with titer of 1.1 3 (Jan 14 2015),  laboratory evaluation showed normal or negative CPK, HIV, protein electrophoresis, positive varicella-zoster virus antibody, mild elevated ESR, C-reactive protein, hepatitis panel, Lyme titer,   Visual evoked potential showed prolongation on the right side.  UPDATE March 22nd 2016: She got first dose Tysarbril March 14th, 2016 doing well, she received 1 round of Acthar injection Feb 17th 2016, right eye vision has much improved, she complains of severe body achy pain, worsening left-sided low back pain, radiating pain to left lower extremity, recent onset worsening constipation, mild gait difficulty  UPDATE March 30 2015: I met patient at IV infusion suites today, she is tolerating Tysarbri IV infusion, no significant side effect noticed, no flareup of her MS symptoms,  She has been complaining worsening headache over the past 1 months, lateralized severe pounding headache with associated light noise sensitivity, I have called in Maxalt, which help her severe headaches, in addition, she has mild bilateral frontal pounding headaches, happen frequently  UPDATE June 17 2015:  She tolerated to Tysabri well, she continue complains of mild blurry vision, gait difficulty, bilateral lower extremity spasticity, muscle tightness, left foot muscle cramping She still has frequent headaches 3-4 times each week , Maxalt works well, she also complains of excessive fatigue, worsening by hot weather, depression anxiety, word finding difficulties, stuttering of speech, Insomnia  UPDATE July 21 2015: She is here for [employer] 9-21 screening  visit, she is tolerating baclofen 10 mg 3 times a day, no longer taking pain  medications, Her depression has much improved  REVIEW OF SYSTEMS: Full 14 system review of systems performed and notable only for as above  ALLERGIES: No Known Allergies  HOME MEDICATIONS: Current Outpatient Prescriptions  Medication Sig  . baclofen (LIORESAL) 10 MG tablet Take 1 tablet (10 mg total) by mouth 3 (three) times daily.  . Calcium-Magnesium-Zinc (CALCIUM-MAGNESUIUM-ZINC PO) Take by mouth.  . Canagliflozin-Metformin HCl 50-1000 MG TABS Take 1 tablet by mouth 2 (two) times daily.  . cholecalciferol (VITAMIN D) 1000 UNITS tablet Take 1,000 Units by mouth 2 (two) times daily.  . diclofenac (VOLTAREN) 75 MG EC tablet TAKE 1 TABLET (75 MG TOTAL) BY MOUTH 2 (TWO) TIMES DAILY as needed  . docusate sodium (COLACE) 50 MG capsule Take 1 capsule (50 mg total) by mouth 2 (two) times daily.  . DULoxetine (CYMBALTA) 60 MG capsule Take 1 capsule (60 mg total) by mouth daily.  Marland Kitchen glucose blood (ACCU-CHEK AVIVA PLUS) test strip Monitor FSBS 1x daily fasting. Dx: 250.00  . lisinopril-hydrochlorothiazide (PRINZIDE,ZESTORETIC) 10-12.5 MG per tablet TAKE 1 TABLET BY MOUTH DAILY. BLOOD PRESSURE  . meclizine (ANTIVERT) 25 MG tablet Take 1 tablet (25 mg total) by mouth 3 (three) times daily as needed for dizziness.  . natalizumab (TYSABRI) 300 MG/15ML injection Inject 300 mg into the vein. Infuse 300mg  IV every 28 days.  . rizatriptan (MAXALT-MLT) 10 MG disintegrating tablet Take 1 tablet (10 mg total) by mouth as needed. May repeat in 2 hours if needed  . topiramate (TOPAMAX) 100 MG tablet Take 1 tablet (100 mg total) by mouth 2 (two) times daily.  . traZODone (DESYREL) 50 MG tablet Take 1 tablet (50 mg total) by mouth at bedtime.  Ortho-cyclen  PAST MEDICAL HISTORY: Past Medical History  Diagnosis Date  . Diabetes mellitus   . Anemia   . Hypertension   . Carpal tunnel syndrome   . Multiple sclerosis     PAST SURGICAL HISTORY: Past Surgical History  Procedure Laterality Date  . Ovarian  Cyst excision  In 1996    . Breast reduction surgery in 2005     wisdom teeth extraction   In 1991.  FAMILY HISTORY: Family History  Problem Relation Age of Onset  . Cancer Mother   . Diabetes Mother   . Hearing loss Mother   . Thyroid nodules Mother   . Anemia Mother   . Kidney disease Mother   . Hypertension Mother   . Hyperlipidemia Father   . Anemia Sister   . Arthritis Maternal Grandmother   . Arthritis Maternal Grandfather   . Arthritis Paternal Grandmother   . Kidney disease Paternal Grandmother   . Hypertension Paternal Grandmother   . Arthritis Paternal Grandfather   . Vision loss Paternal Grandfather   . Stroke Paternal Grandfather   . Hyperlipidemia Paternal Grandfather   . Anemia Sister     SOCIAL HISTORY:  History   Social History  . Marital Status: Single    Spouse Name: N/A  . Number of Children: 0  . Years of Education: College   Occupational History  . unemployed    Social History Main Topics  . Smoking status: Never Smoker   . Smokeless tobacco: Never Used  . Alcohol Use: No  . Drug Use: No  . Sexual Activity: Not Currently    Birth Control/ Protection: Pill   Other Topics Concern  . Not on file  Social History Narrative   Live at home with parents.   Right handed.   Uses caffeine sparingly.     PHYSICAL EXAM   There were no vitals filed for this visit.  Not recorded      There is no weight on file to calculate BMI.    PHYSICAL EXAMNIATION:  Gen: NAD, conversant, well nourised, obese, well groomed                     Cardiovascular: Regular rate rhythm, no peripheral edema, warm, nontender. Eyes: Conjunctivae clear without exudates or hemorrhage Neck: Supple, no carotid bruise. Pulmonary: Clear to auscultation bilaterally   NEUROLOGICAL EXAM:  MENTAL STATUS: Speech:    Speech is normal; fluent and spontaneous with normal comprehension.  Cognition:    The patient is oriented to person, place, and time;     recent  and remote memory intact;     language fluent;     normal attention, concentration,     fund of knowledge.  CRANIAL NERVES: CN II: Visual fields are full to confrontation. Fundoscopic exam is normal with sharp discs and no vascular changes. Pupils were equal round reactive to light CN III, IV, VI: extraocular movement are normal. No ptosis. CN V: Facial sensation is intact to pinprick in all 3 divisions bilaterally. Corneal responses are intact.  CN VII: Face is symmetric with normal eye closure and smile. CN VIII: Hearing is normal to rubbing fingers CN IX, X: Palate elevates symmetrically. Phonation is normal. CN XI: Head turning and shoulder shrug are intact CN XII: Tongue is midline with normal movements and no atrophy.  MOTOR: She has mild bilateral lower extremity spasticity, no significant weakness, normal upper extremity bulk and strength.  REFLEXES: Reflexes are 2+ and symmetric at the biceps, triceps, 3/3 knees, and ankles. Plantar responses are flexor.  SENSORY: Light touch, pinprick, position sense, and vibration sense are intact in fingers and toes.  COORDINATION: Rapid alternating movements and fine finger movements are intact. There is no dysmetria on finger-to-nose and heel-knee-shin. There are no abnormal or extraneous movements.   GAIT/STANCE: Mildly stiff, cautious, difficulty with tandem walking  DIAGNOSTIC DATA (LABS, IMAGING, TESTING) - I reviewed patient records, labs, notes, testing and imaging myself where available.  Lab Results  Component Value Date   WBC 9.6 06/17/2015   HGB 11.1* 11/02/2014   HCT 35.2 06/17/2015   MCV 79.9 11/02/2014   PLT 367 11/02/2014      Component Value Date/Time   NA 138 06/17/2015 1118   NA 141 11/02/2014 0944   K 4.4 06/17/2015 1118   CL 100 06/17/2015 1118   CO2 22 06/17/2015 1118   GLUCOSE 235* 06/17/2015 1118   GLUCOSE 208* 11/02/2014 0944   BUN 9 06/17/2015 1118   BUN 9 11/02/2014 0944   CREATININE 0.66  06/17/2015 1118   CREATININE 0.70 11/02/2014 0944   CALCIUM 9.4 06/17/2015 1118   PROT 6.8 06/17/2015 1118   PROT 7.8 11/02/2014 0944   ALBUMIN 4.1 11/02/2014 0944   AST 10 06/17/2015 1118   ALT 11 06/17/2015 1118   ALKPHOS 61 06/17/2015 1118   BILITOT 0.3 06/17/2015 1118   BILITOT 0.5 11/02/2014 0944   GFRNONAA 109 06/17/2015 1118   GFRAA 126 06/17/2015 1118   Lab Results  Component Value Date   CHOL 208* 11/02/2014   HDL 66 11/02/2014   LDLCALC 119* 11/02/2014   TRIG 114 11/02/2014   CHOLHDL 3.2 11/02/2014   Lab Results  Component Value Date   HGBA1C 7.7* 06/17/2015   No results found for: [id number] Lab Results  Component Value Date   TSH 1.281 07/28/2014     Radiology: MRI brain in Jan 2016: Moderately advanced predominantly periventricular white matter signal abnormality with significant involvement of the corpus callosum. Some lesions are radially arranged perpendicular to that structure. Some lesions have not ovoid configuration. There is T2 and FLAIR hyperintensity of callosal septal interface. Brainstem involvement is observed in the BILATERAL LEFT greater than RIGHT cerebral peduncles MRI cervical in Feb 2nd 2016: This is an abnormal MRI of the cervical spine with and without contrast showing hyperintense foci within the spinal cord posteriorly at C4 and posteriolaterally at C7-T1 consistent with multiple sclerosis plaques. There were no enhancing foci. CSF study in January 2016, elevated IgG index, negative oligoclonal banding JC virus-Ab:  positive with titer of 1.1 3 (Jan 14 2015) Treatment: Tysabri infusion since March 01 2015  ASSESSMENT AND PLAN  Katrina Montes is a 44 y.o. female  with relapsing remitting multiple sclerosis, presented with gait difficulty, right optic neuritis since January 2016, diagnosis was confirmed by  abnormal MRI findings,she has lesions at cervical spine,  Positive JC virus antibody, with titer of 1.13 in January 2016. She has  received her first dose Tysarbri IV infusion in March 01 2015, tolerating it well,, Right vision has much improved after Achtar treatment  1, relapsing remitting multiple sclerosis, continue with Tysarbri infusion  2, migraine headaches, Topamax as preventive medications, Maxalt as needed  3, depression anxiety, continue Cymbalta 60 mg daily,  trazodone 50 mg every night  4. Spasticity, keep baclofen $RemoveBefor'10mg'[id number]$  tid. Enrolled in [facility] 9-21 trial  Marcial Pacas, M.D. Ph.D.  [facility] [address], [place], [place] [address] [phone]

## 2015-07-22 ENCOUNTER — Telehealth: Payer: Self-pay

## 2015-07-22 NOTE — Telephone Encounter (Signed)
I spoke to the patient about rescheduling her research appointment for the [facility] study. Appointment was rescheduled for 15AUG2016 at 08:00h.

## 2015-07-26 ENCOUNTER — Encounter: Payer: 59 | Admitting: Neurology

## 2015-08-02 ENCOUNTER — Encounter (INDEPENDENT_AMBULATORY_CARE_PROVIDER_SITE_OTHER): Payer: Self-pay | Admitting: Neurology

## 2015-08-02 DIAGNOSIS — Z0289 Encounter for other administrative examinations: Secondary | ICD-10-CM

## 2015-08-09 ENCOUNTER — Encounter (INDEPENDENT_AMBULATORY_CARE_PROVIDER_SITE_OTHER): Payer: Self-pay

## 2015-08-09 DIAGNOSIS — Z0289 Encounter for other administrative examinations: Secondary | ICD-10-CM

## 2015-08-10 ENCOUNTER — Telehealth: Payer: Self-pay | Admitting: *Deleted

## 2015-08-10 NOTE — Telephone Encounter (Signed)
Patient records mailed out on 08/10/15. 

## 2015-08-16 ENCOUNTER — Telehealth: Payer: Self-pay

## 2015-08-16 NOTE — Telephone Encounter (Signed)
I spoke to the patient in re the [facility] study for Week 3 telephonic contact. Patient expressed that she has been having increased spasms. Patient was scheduled for an unscheduled visit dose adjustment for 30AUG2016.

## 2015-08-17 ENCOUNTER — Ambulatory Visit (INDEPENDENT_AMBULATORY_CARE_PROVIDER_SITE_OTHER): Payer: Self-pay | Admitting: Neurology

## 2015-08-17 DIAGNOSIS — R252 Cramp and spasm: Secondary | ICD-10-CM

## 2015-08-17 DIAGNOSIS — Z0289 Encounter for other administrative examinations: Secondary | ICD-10-CM

## 2015-08-17 DIAGNOSIS — R258 Other abnormal involuntary movements: Secondary | ICD-10-CM

## 2015-08-17 NOTE — Progress Notes (Addendum)
[employer] 9-21 RESEARCH STUDY VISIT  Katrina Montes was seen today as an unscheduled study visit. She had called complaining of increasing spasms, as pain and tightness in her hips after she had change over from baclofen 10 mg 3 times daily to extended release 30 mg once a day. I spoke to her and her family and confirmed her history. She is tolerating the current dose well without significant daytime sleepiness, tiredness or dryness of mouth. She would prefer the dose increased and I and am in agreement. Plan increase study medication to 40 mg extended release daily due to suboptimal control of spasticity. Patient and family were advised about possible side effects and asked to call if she develops them. Further follow-up in the study as per protocol with Dr. Ulice Dash, MD

## 2015-08-24 ENCOUNTER — Ambulatory Visit: Payer: Self-pay | Admitting: Neurology

## 2015-08-25 ENCOUNTER — Encounter (INDEPENDENT_AMBULATORY_CARE_PROVIDER_SITE_OTHER): Payer: Self-pay

## 2015-08-25 ENCOUNTER — Telehealth: Payer: Self-pay

## 2015-08-25 DIAGNOSIS — Z0289 Encounter for other administrative examinations: Secondary | ICD-10-CM

## 2015-08-25 NOTE — Telephone Encounter (Signed)
I spoke to the patient to schedule a study medication and diary compliance check for 08SEP2016 at 09:00h.

## 2015-08-26 ENCOUNTER — Encounter (INDEPENDENT_AMBULATORY_CARE_PROVIDER_SITE_OTHER): Payer: Self-pay

## 2015-08-26 DIAGNOSIS — Z0289 Encounter for other administrative examinations: Secondary | ICD-10-CM

## 2015-09-07 ENCOUNTER — Encounter (INDEPENDENT_AMBULATORY_CARE_PROVIDER_SITE_OTHER): Payer: Self-pay | Admitting: Neurology

## 2015-09-07 ENCOUNTER — Telehealth: Payer: Self-pay

## 2015-09-07 DIAGNOSIS — Z0289 Encounter for other administrative examinations: Secondary | ICD-10-CM

## 2015-09-07 NOTE — Telephone Encounter (Signed)
I spoke to the patient in re her blood pressure taken this morning (146/105) during Visit 4 for the [facility] study. Patient stated that she checks her blood pressure daily, and that she did so before coming to the office for the appointment, and it was within a normal rage (Systolic: 123 Diastolic: Less than 100). I advised her to check her BP daily and to call the office if the blood pressure is continuously elevated.

## 2015-10-04 ENCOUNTER — Encounter (INDEPENDENT_AMBULATORY_CARE_PROVIDER_SITE_OTHER): Payer: Self-pay

## 2015-10-04 DIAGNOSIS — Z0289 Encounter for other administrative examinations: Secondary | ICD-10-CM

## 2015-10-11 ENCOUNTER — Telehealth: Payer: Self-pay

## 2015-10-11 NOTE — Telephone Encounter (Signed)
I left a message for the patient to return my call.

## 2015-11-01 ENCOUNTER — Encounter (INDEPENDENT_AMBULATORY_CARE_PROVIDER_SITE_OTHER): Payer: Self-pay

## 2015-11-01 DIAGNOSIS — Z0289 Encounter for other administrative examinations: Secondary | ICD-10-CM

## 2015-11-23 DIAGNOSIS — Z0271 Encounter for disability determination: Secondary | ICD-10-CM

## 2015-11-29 ENCOUNTER — Encounter (INDEPENDENT_AMBULATORY_CARE_PROVIDER_SITE_OTHER): Payer: Self-pay | Admitting: Neurology

## 2015-11-29 DIAGNOSIS — Z0289 Encounter for other administrative examinations: Secondary | ICD-10-CM

## 2015-12-01 ENCOUNTER — Telehealth: Payer: Self-pay | Admitting: Neurology

## 2015-12-01 NOTE — Telephone Encounter (Signed)
I spoke with Elmon Kirschner.  We confirmed her research visit date for Jan. 5th, 2017 at 9:30am

## 2015-12-06 ENCOUNTER — Telehealth: Payer: Self-pay | Admitting: Neurology

## 2015-12-06 NOTE — Telephone Encounter (Signed)
I left a voice message for Makenleigh Degante.  I asked if she could call me back on my direct number [phone] concerning her Visit 9 Research appointment.

## 2015-12-14 ENCOUNTER — Encounter (INDEPENDENT_AMBULATORY_CARE_PROVIDER_SITE_OTHER): Payer: Self-pay | Admitting: Neurology

## 2015-12-14 DIAGNOSIS — Z0289 Encounter for other administrative examinations: Secondary | ICD-10-CM

## 2015-12-21 ENCOUNTER — Telehealth: Payer: Self-pay | Admitting: Neurology

## 2015-12-21 NOTE — Telephone Encounter (Signed)
I spoke with Katrina Montes.  I confirmed her appointment date for this coming Thursday, January 5th at 9:30am.  I also informed her that she will not be able to roll over to the 11_04 study due to the medication not being available.

## 2015-12-22 ENCOUNTER — Telehealth: Payer: Self-pay | Admitting: Neurology

## 2015-12-22 NOTE — Telephone Encounter (Signed)
I left a voicemail message for [employer].  I reminded her of her scheduled research appointment for tomorrow at 9:30am.  I asked her to arrive at 9:30am.

## 2015-12-23 ENCOUNTER — Ambulatory Visit (INDEPENDENT_AMBULATORY_CARE_PROVIDER_SITE_OTHER): Payer: Self-pay | Admitting: Neurology

## 2015-12-23 DIAGNOSIS — G35 Multiple sclerosis: Secondary | ICD-10-CM

## 2015-12-23 NOTE — Progress Notes (Signed)
Patient came in for exit visit for [employer] 9-21, baclofen did help her bilateral lower extremity spasticity, I have written baclofen 10 mg 4 times a day  I also ordered MRI of the brain with without contrast JC virus antibody titer

## 2015-12-28 ENCOUNTER — Other Ambulatory Visit (INDEPENDENT_AMBULATORY_CARE_PROVIDER_SITE_OTHER): Payer: Self-pay

## 2015-12-28 ENCOUNTER — Other Ambulatory Visit: Payer: Self-pay | Admitting: Neurology

## 2015-12-28 DIAGNOSIS — E669 Obesity, unspecified: Secondary | ICD-10-CM

## 2015-12-28 DIAGNOSIS — Z0289 Encounter for other administrative examinations: Secondary | ICD-10-CM

## 2015-12-29 ENCOUNTER — Telehealth: Payer: Self-pay | Admitting: Neurology

## 2015-12-29 LAB — CBC
HEMATOCRIT: 37.2 % (ref 34.0–46.6)
Hemoglobin: 11.6 g/dL (ref 11.1–15.9)
MCH: 25 pg — ABNORMAL LOW (ref 26.6–33.0)
MCHC: 31.2 g/dL — AB (ref 31.5–35.7)
MCV: 80 fL (ref 79–97)
PLATELETS: 309 10*3/uL (ref 150–379)
RBC: 4.64 x10E6/uL (ref 3.77–5.28)
RDW: 16.7 % — AB (ref 12.3–15.4)
WBC: 9.3 10*3/uL (ref 3.4–10.8)

## 2015-12-29 LAB — COMPREHENSIVE METABOLIC PANEL
ALK PHOS: 78 IU/L (ref 39–117)
ALT: 16 IU/L (ref 0–32)
AST: 13 IU/L (ref 0–40)
Albumin/Globulin Ratio: 1.3 (ref 1.1–2.5)
Albumin: 4 g/dL (ref 3.5–5.5)
BUN/Creatinine Ratio: 17 (ref 9–23)
BUN: 11 mg/dL (ref 6–24)
Bilirubin Total: 0.3 mg/dL (ref 0.0–1.2)
CO2: 23 mmol/L (ref 18–29)
CREATININE: 0.66 mg/dL (ref 0.57–1.00)
Calcium: 9.1 mg/dL (ref 8.7–10.2)
Chloride: 102 mmol/L (ref 96–106)
GFR calc Af Amer: 125 mL/min/{1.73_m2} (ref >59)
GFR calc non Af Amer: 109 mL/min/{1.73_m2} (ref >59)
GLOBULIN, TOTAL: 3.1 g/dL (ref 1.5–4.5)
GLUCOSE: 202 mg/dL — AB (ref 65–99)
POTASSIUM: 4.5 mmol/L (ref 3.5–5.2)
SODIUM: 139 mmol/L (ref 134–144)
Total Protein: 7.1 g/dL (ref 6.0–8.5)

## 2015-12-29 NOTE — Telephone Encounter (Signed)
Spoke to [employer] - she is aware of labs and will start the recommended supplements.

## 2015-12-29 NOTE — Telephone Encounter (Signed)
Please Call patient, laboratory evaluation showed low normal range hemoglobin, 11.6, with low MCHC, elevated RDW, usually indicating iron deficiency anemia, CMP showed elevated glucose 202.  Please advise her take ferrous sulfate 325 mg daily after meal, multivitamin supplements

## 2016-01-04 ENCOUNTER — Telehealth: Payer: Self-pay | Admitting: *Deleted

## 2016-01-04 NOTE — Telephone Encounter (Signed)
Labs collected 12/29/15 -   JCV 1.11 positive

## 2016-01-06 ENCOUNTER — Ambulatory Visit (HOSPITAL_COMMUNITY): Admission: RE | Admit: 2016-01-06 | Payer: Self-pay | Source: Ambulatory Visit

## 2016-01-26 ENCOUNTER — Other Ambulatory Visit: Payer: Self-pay | Admitting: Family Medicine

## 2016-01-26 ENCOUNTER — Encounter: Payer: Self-pay | Admitting: Neurology

## 2016-01-26 DIAGNOSIS — Z1231 Encounter for screening mammogram for malignant neoplasm of breast: Secondary | ICD-10-CM

## 2016-02-02 ENCOUNTER — Ambulatory Visit (HOSPITAL_COMMUNITY)
Admission: RE | Admit: 2016-02-02 | Discharge: 2016-02-02 | Disposition: A | Discharge status: Home / Self Care | Payer: Self-pay | Source: Ambulatory Visit | Attending: Neurology | Admitting: Neurology

## 2016-02-02 ENCOUNTER — Telehealth: Payer: Self-pay | Admitting: Neurology

## 2016-02-02 DIAGNOSIS — G35 Multiple sclerosis: Secondary | ICD-10-CM | POA: Insufficient documentation

## 2016-02-02 MED ORDER — GADOBENATE DIMEGLUMINE 529 MG/ML IV SOLN
20.0000 mL | Freq: Once | INTRAVENOUS | Status: AC | PRN
  Administered 2016-02-02: 20 mL via INTRAVENOUS

## 2016-02-02 NOTE — Telephone Encounter (Signed)
Please call patient continued evidence of MS on MRI of the brain scan, no significant change

## 2016-02-02 NOTE — Telephone Encounter (Signed)
Spoke to patient she is aware of MRI results ?

## 2016-02-09 ENCOUNTER — Ambulatory Visit
Admission: RE | Admit: 2016-02-09 | Discharge: 2016-02-09 | Disposition: A | Discharge status: Home / Self Care | Payer: No Typology Code available for payment source | Source: Ambulatory Visit | Attending: Family Medicine | Admitting: Family Medicine

## 2016-02-09 DIAGNOSIS — Z1231 Encounter for screening mammogram for malignant neoplasm of breast: Secondary | ICD-10-CM

## 2016-02-22 ENCOUNTER — Ambulatory Visit (INDEPENDENT_AMBULATORY_CARE_PROVIDER_SITE_OTHER): Payer: Self-pay | Admitting: Neurology

## 2016-02-22 ENCOUNTER — Encounter: Payer: Self-pay | Admitting: Neurology

## 2016-02-22 VITALS — BP 160/92 | HR 70 | Ht 66.0 in | Wt 285.0 lb

## 2016-02-22 DIAGNOSIS — M25552 Pain in left hip: Secondary | ICD-10-CM

## 2016-02-22 DIAGNOSIS — M5416 Radiculopathy, lumbar region: Secondary | ICD-10-CM

## 2016-02-22 NOTE — Progress Notes (Signed)
Chief Complaint  Patient presents with  . Back Pain    She is here for an evaluation of worsening back pain. Pain is radiating down her left leg to her knee.      PATIENT: Katrina Montes DOB: 03/26/1971  HISTORICAL (Initial visit Jan 2016)  Katrina Montes is 45 right-handed African-American femaleright-handed African-American female, accompanied by her mother, referred by her primary care physician Dr. 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,  In May of 2015, she had transient right eye blurry vision, was considered due to her high glucose, recovered within a few weeks  Since January 2016, she began to experience intermittent vertigo, dizziness, also had worsening right eye blurry vision,, color washing out, could only see her rim at her right peripheral visual field, she denies significant eye pain  MRI of the brain without contrast at [facility] January 2016 showed multiple periventricular white matter disease, significant involvement of corpus callosum, perpendicular to ventricle, consistent with multiple sclerosis, now was also suspicious for right optic nerve edema, with hyperintensity signal at T2, FLAIR,  She was treated with IV steroid in January 15, 18, 19, only has mild improvement of her right vision, has significant worsening of her blood glucose level.  Spinal fluid testing January 13 2015, total protein was 708, RBC was 1600, RBC was 7,, cloudy reddish-looking,  Laboratory showed anemia, hemoglobin 11.8, otherwise normal CBC, normal CMP, with exception of elevated glucose, normal TSH,  UPDATE Feb 22nd 2016: Acthar injection training today, she still has right blurry vision, mild improvement, mild unsteady gait, her diabetes is  under suboptimal control,  We have reviewed MRI of the cervical spine with and without contrast in Jan 2016: showing hyperintense foci within the spinal cord posteriorly at C4 and posteriolaterally at C7-T1 consistent with multiple sclerosis plaques. There were no enhancing foci.  There was no enhancing lesions at MRI of the brain, MRA of the brain was normal,   JC virus was positive with titer of 1.1 3 (Jan 14 2015),  laboratory evaluation showed normal or negative CPK, HIV, protein electrophoresis, positive varicella-zoster virus antibody, mild elevated ESR, C-reactive protein, hepatitis panel, Lyme titer,   Visual evoked potential showed prolongation on the right side.  UPDATE March 22nd 2016: She got first dose Tysarbril March 14th, 2016 doing well, she received 1 round of Acthar injection Feb 17th 2016, right eye vision has much improved, she complains of severe body achy pain, worsening left-sided low back pain, radiating pain to left lower extremity, recent onset worsening constipation, mild gait difficulty  UPDATE March 30 2015: I met patient at IV infusion suites today, she is tolerating Tysarbri IV infusion, no significant side effect noticed, no flareup of her MS symptoms,  She has been complaining worsening headache over the past 1 months, lateralized severe pounding headache with associated light noise sensitivity, I have called in Maxalt, which help her severe headaches, in addition, she has mild bilateral frontal pounding headaches, happen frequently  UPDATE June 17 2015:  She tolerated to Tysabri well, she continue complains of mild blurry vision, gait difficulty, bilateral lower extremity spasticity, muscle tightness, left foot muscle cramping She still has frequent headaches 3-4 times each week , Maxalt works well, she also  complains of excessive fatigue, worsening by hot weather, depression anxiety, word finding difficulties, stuttering of speech, Insomnia  UPDATE July 21 2015: She is here for [employer] 9-21 screening visit, she is tolerating baclofen 10 mg 3 times a day, no longer taking pain medications, Her depression has much improved  Update February 22 2016: She complains severe left-sided low back pain, radiating pain to left hip, had a history of previous high-dose prednisone treatment, obesity, diabetic  REVIEW OF SYSTEMS: Full 14 system review of systems performed and notable only for as above  ALLERGIES: No Known Allergies  HOME MEDICATIONS: Current Outpatient Prescriptions  Medication Sig  . baclofen (LIORESAL) 10 MG tablet Take 1 tablet (10 mg total) by mouth 3 (three) times daily.  . Calcium-Magnesium-Zinc (CALCIUM-MAGNESUIUM-ZINC PO) Take by mouth.  . Canagliflozin-Metformin HCl 50-1000 MG TABS Take 1 tablet by mouth 2 (two) times daily.  . cholecalciferol (VITAMIN D) 1000 UNITS tablet Take 1,000 Units by mouth 2 (two) times daily.  . diclofenac (VOLTAREN) 75 MG EC tablet TAKE 1 TABLET (75 MG TOTAL) BY MOUTH 2 (TWO) TIMES DAILY as needed  . docusate sodium (COLACE) 50 MG capsule Take 1 capsule (50 mg total) by mouth 2 (two) times daily.  . DULoxetine (CYMBALTA) 60 MG capsule Take 1 capsule (60 mg total) by mouth daily.  Marland Kitchen glucose blood (ACCU-CHEK AVIVA PLUS) test strip Monitor FSBS 1x daily fasting. Dx: 250.00  . lisinopril-hydrochlorothiazide (PRINZIDE,ZESTORETIC) 10-12.5 MG per tablet TAKE 1 TABLET BY MOUTH DAILY. BLOOD PRESSURE  . meclizine (ANTIVERT) 25 MG tablet Take 1 tablet (25 mg total) by mouth 3 (three) times daily as needed for dizziness.  . natalizumab (TYSABRI) 300 MG/15ML injection Inject 300 mg into the vein. Infuse '300mg'$  IV every 28 days.  . rizatriptan (MAXALT-MLT) 10 MG disintegrating tablet Take 1 tablet (10 mg total) by mouth as needed. May repeat in 2 hours if needed  . topiramate (TOPAMAX) 100 MG tablet Take 1 tablet (100 mg total) by mouth 2 (two) times daily.  . traZODone (DESYREL) 50 MG tablet Take 1 tablet (50 mg total)  by mouth at bedtime.  Ortho-cyclen  PAST MEDICAL HISTORY: Past Medical History  Diagnosis Date  . Diabetes mellitus   . Anemia   . Hypertension   . Carpal tunnel syndrome   . Multiple sclerosis     PAST SURGICAL HISTORY: Past Surgical History  Procedure Laterality Date  . Ovarian Cyst excision  In 1996    . Breast reduction surgery in 2005     wisdom teeth extraction   In 1991.  FAMILY HISTORY: Family History  Problem Relation Age of Onset  . Cancer Mother   . Diabetes Mother   . Hearing loss Mother   . Thyroid nodules Mother   . Anemia Mother   . Kidney disease Mother   . Hypertension Mother   . Hyperlipidemia Father   . Anemia Sister   . Arthritis Maternal Grandmother   . Arthritis Maternal Grandfather   . Arthritis Paternal Grandmother   . Kidney disease Paternal Grandmother   . Hypertension Paternal Grandmother   . Arthritis Paternal Grandfather   . Vision loss Paternal Grandfather   . Stroke Paternal Grandfather   . Hyperlipidemia Paternal Grandfather   . Anemia Sister     SOCIAL HISTORY:  Social History   Social History  . Marital Status: Single    Spouse Name: N/A  . Number of Children: 0  . Years of Education: College   Occupational History  .  unemployed    Social History Main Topics  . Smoking status: Never Smoker   . Smokeless tobacco: Never Used  . Alcohol Use: No  . Drug Use: No  . Sexual Activity: Not Currently    Birth Control/ Protection: Pill   Other Topics Concern  . Not on file   Social History Narrative   Live at home with parents.   Right handed.   Uses caffeine sparingly.     PHYSICAL EXAM   Filed Vitals:   02/22/16 1053  BP: 160/92  Pulse: 70  Height: '5\' 6"'$  (1.676 m)  Weight: 285 lb (129.275 kg)    Not recorded      Body mass index is 46.02 kg/(m^2).    PHYSICAL EXAMNIATION:  Gen: NAD, conversant, well nourised, obese, well groomed                     Cardiovascular: Regular rate rhythm, no peripheral  edema, warm, nontender. Eyes: Conjunctivae clear without exudates or hemorrhage Neck: Supple, no carotid bruise. Pulmonary: Clear to auscultation bilaterally   NEUROLOGICAL EXAM:  MENTAL STATUS: Speech:    Speech is normal; fluent and spontaneous with normal comprehension.  Cognition:    The patient is oriented to person, place, and time;     recent and remote memory intact;     language fluent;     normal attention, concentration,     fund of knowledge.  CRANIAL NERVES: CN II: Visual fields are full to confrontation. Fundoscopic exam is normal with sharp discs and no vascular changes. Pupils were equal round reactive to light CN III, IV, VI: extraocular movement are normal. No ptosis. CN V: Facial sensation is intact to pinprick in all 3 divisions bilaterally. Corneal responses are intact.  CN VII: Face is symmetric with normal eye closure and smile. CN VIII: Hearing is normal to rubbing fingers CN IX, X: Palate elevates symmetrically. Phonation is normal. CN XI: Head turning and shoulder shrug are intact CN XII: Tongue is midline with normal movements and no atrophy.  MOTOR: She has mild bilateral lower extremity spasticity, no significant weakness, normal upper extremity bulk and strength.  REFLEXES: Reflexes are 2+ and symmetric at the biceps, triceps, 3/3 knees, and ankles. Plantar responses are flexor. Cross leg testing showed severe left hip pain radiating to left groin area  SENSORY: Light touch, pinprick, position sense, and vibration sense are intact in fingers and toes.  COORDINATION: Rapid alternating movements and fine finger movements are intact. There is no dysmetria on finger-to-nose and heel-knee-shin. There are no abnormal or extraneous movements.   GAIT/STANCE: Mildly stiff, cautious, difficulty with tandem walking  DIAGNOSTIC DATA (LABS, IMAGING, TESTING) - I reviewed patient records, labs, notes, testing and imaging myself where available.  Lab  Results  Component Value Date   WBC 9.3 12/28/2015   HGB 11.1* 11/02/2014   HCT 37.2 12/28/2015   MCV 80 12/28/2015   PLT 309 12/28/2015      Component Value Date/Time   NA 139 12/28/2015 1126   NA 141 11/02/2014 0944   K 4.5 12/28/2015 1126   CL 102 12/28/2015 1126   CO2 23 12/28/2015 1126   GLUCOSE 202* 12/28/2015 1126   GLUCOSE 208* 11/02/2014 0944   BUN 11 12/28/2015 1126   BUN 9 11/02/2014 0944   CREATININE 0.66 12/28/2015 1126   CREATININE 0.70 11/02/2014 0944   CALCIUM 9.1 12/28/2015 1126   PROT 7.1 12/28/2015 1126   PROT 7.8 11/02/2014 0944  ALBUMIN 4.0 12/28/2015 1126   ALBUMIN 4.1 11/02/2014 0944   AST 13 12/28/2015 1126   ALT 16 12/28/2015 1126   ALKPHOS 78 12/28/2015 1126   BILITOT 0.3 12/28/2015 1126   BILITOT 0.5 11/02/2014 0944   GFRNONAA 109 12/28/2015 1126   GFRAA 125 12/28/2015 1126   Lab Results  Component Value Date   CHOL 208* 11/02/2014   HDL 66 11/02/2014   LDLCALC 119* 11/02/2014   TRIG 114 11/02/2014   CHOLHDL 3.2 11/02/2014   Lab Results  Component Value Date   HGBA1C 7.7* 06/17/2015   No results found for: [id number] Lab Results  Component Value Date   TSH 1.281 07/28/2014     Radiology: MRI brain in Jan 2016: Moderately advanced predominantly periventricular white matter signal abnormality with significant involvement of the corpus callosum. Some lesions are radially arranged perpendicular to that structure. Some lesions have not ovoid configuration. There is T2 and FLAIR hyperintensity of callosal septal interface. Brainstem involvement is observed in the BILATERAL LEFT greater than RIGHT cerebral peduncles MRI cervical in Feb 2nd 2016: This is an abnormal MRI of the cervical spine with and without contrast showing hyperintense foci within the spinal cord posteriorly at C4 and posteriolaterally at C7-T1 consistent with multiple sclerosis plaques. There were no enhancing foci. CSF study in January 2016, elevated IgG index, negative  oligoclonal banding JC virus-Ab:  positive with titer of 1.1 3 (Jan 14 2015) Treatment: Tysabri infusion since March 01 2015  ASSESSMENT AND PLAN  Katrina Montes is a 45 y.o. female  with relapsing remitting multiple sclerosis, presented with gait difficulty, right optic neuritis since January 2016, diagnosis was confirmed by  abnormal MRI findings,she has lesions at cervical spine,  Positive JC virus antibody, with titer of 1.13 in January 2016. She has received her first dose Tysarbri IV infusion in March 01 2015, tolerating it well,, Right vision has much improved after Achtar treatment  1, relapsing remitting multiple sclerosis, continue with Tysarbri infusion  2, migraine headaches, Topamax as preventive medications, Maxalt as needed  3, depression anxiety, continue Cymbalta 60 mg daily,  trazodone 50 mg every night  4. Spasticity, keep baclofen '10mg'$  tid 5. Left hip, left low back pain, differentiation diagnosis includes left hip pathology versus left lumbar radiculopathy, x-ray of left hip, and lumbar, ibuprofen, Tylenol as needed,  Marcial Pacas, M.D. Ph.D.  [facility] [address] [place], [place] [address] [phone]

## 2016-06-07 ENCOUNTER — Encounter: Payer: Self-pay | Admitting: *Deleted

## 2016-06-07 ENCOUNTER — Telehealth: Payer: Self-pay | Admitting: Neurology

## 2016-06-07 MED ORDER — MELOXICAM 15 MG PO TABS: 15.0000 mg | ORAL_TABLET | Freq: Every day | ORAL | Status: DC

## 2016-06-07 NOTE — Telephone Encounter (Signed)
MS patient is calling and states she is having increased pain from back down to knees. She is requesting a call back.

## 2016-06-07 NOTE — Telephone Encounter (Signed)
She is still having low back pain - radiates into left hip and down her left leg to her knee.  She has been taking ibuprofen without relief.

## 2016-06-07 NOTE — Telephone Encounter (Signed)
Dr. Terrace Arabia reviewed patient's chart - she had ordered x-rays previously and would like her to have them completed - pt agreeable (she delayed x-rays before due to ins problems).  Also, Dr. Terrace Arabia has provided her with a prescription for meloxicam , one tablet daily for pain.

## 2016-06-22 ENCOUNTER — Other Ambulatory Visit: Payer: Self-pay | Admitting: Neurology

## 2016-06-22 ENCOUNTER — Telehealth: Payer: Self-pay | Admitting: Neurology

## 2016-06-22 ENCOUNTER — Ambulatory Visit
Admission: RE | Admit: 2016-06-22 | Discharge: 2016-06-22 | Disposition: A | Discharge status: Home / Self Care | Payer: No Typology Code available for payment source | Source: Ambulatory Visit | Attending: Neurology | Admitting: Neurology

## 2016-06-22 DIAGNOSIS — M25552 Pain in left hip: Secondary | ICD-10-CM

## 2016-06-22 DIAGNOSIS — M5416 Radiculopathy, lumbar region: Secondary | ICD-10-CM

## 2016-06-22 NOTE — Telephone Encounter (Signed)
Please call patient, x-ray of lumbar spine showed Degenerative disc disease at L4-5. 2. 6 mm anterolisthesis of L4 on L5 most likely due to degenerative change involving the facet joints of L4-5 and L5-S1. This will explain her low back pain, hip pain, I will review films at her  next follow-up visit in July 10  X-ray of hip showed no significant pathology

## 2016-06-22 NOTE — Telephone Encounter (Signed)
I have spoken with Katrina Montes and per YY, advised that she has degen. changes in her lower back, facet joints, that likely are the reason for her back pain.  The x-ray of her hip did not show anything significant. YY will explain in greater detail at pending 06-26-16 ov appt.    She verbalized understanding of same/fim

## 2016-06-23 ENCOUNTER — Telehealth: Payer: Self-pay | Admitting: Neurology

## 2016-06-23 NOTE — Telephone Encounter (Signed)
Pt called in to check on her pt assistance. She has an appt Monday at 930 and does not have the up front cost with out the assistance. Pt wants to know about having a work in appt on her infusion appt. July 25. She does not want to r/s right now, does not know what to do. I have offered to r/s her now and ask for work in. Again pt declined. Pt has been made aware of 24 hr cancellation policy and fee to r/s if not done at appropriate time. Pt still declined and would like a call back from nurse or physician to discuss. [phone]

## 2016-06-23 NOTE — Telephone Encounter (Signed)
I  Have called and left message. Call her again to see what we can do to help her.

## 2016-06-26 ENCOUNTER — Other Ambulatory Visit: Payer: Self-pay | Admitting: *Deleted

## 2016-06-26 ENCOUNTER — Ambulatory Visit: Payer: Self-pay | Admitting: Neurology

## 2016-06-26 DIAGNOSIS — M545 Low back pain: Secondary | ICD-10-CM

## 2016-06-26 NOTE — Telephone Encounter (Signed)
Per Dr. Terrace Arabia, based on her x-ray results, she will need MRI lumbar w/out.  She will need to continue meloxicam , qd and low back exercises/stretches.  MRI order placed in Epic.

## 2016-06-28 ENCOUNTER — Other Ambulatory Visit: Payer: Self-pay | Admitting: *Deleted

## 2016-06-28 MED ORDER — GABAPENTIN 300 MG PO CAPS: 300.0000 mg | ORAL_CAPSULE | Freq: Three times a day (TID) | ORAL | Status: DC

## 2016-06-30 NOTE — Telephone Encounter (Signed)
Noted  

## 2016-07-04 ENCOUNTER — Telehealth: Payer: Self-pay | Admitting: Neurology

## 2016-07-04 ENCOUNTER — Ambulatory Visit
Admission: RE | Admit: 2016-07-04 | Discharge: 2016-07-04 | Disposition: A | Discharge status: Home / Self Care | Payer: No Typology Code available for payment source | Source: Ambulatory Visit | Attending: Neurology | Admitting: Neurology

## 2016-07-04 DIAGNOSIS — M545 Low back pain: Secondary | ICD-10-CM

## 2016-07-04 NOTE — Telephone Encounter (Signed)
Please call patient Katrina Montes Have personally reviewed MRI of lumbar spine, there is evidence of L4 on L5 vertebral body movement, with severe facet hypertrophy, moderate to severe left foraminal stenosis, potential left L4-L5 nerve roots compression.  Above findings will likely explaining her left low back pain radiating pain to left lower extremity,  Options are  1, neuropathic pain medications, gabapentin, 2, anti-inflammatory medications, ibuprofen/Tylenol 3. Back stretching exercise 4. Epidural injection for pain control 5, if she has no significant left ankle weakness, gait abnormality, I do not think she is a surgical candidate yet.   IMPRESSION: This MRI of the lumbar spine without contrast shows the following: 1. There is minimal anterolisthesis of L4 upon L5 due to severe facet hypertrophy associated with left ligamentum flavum hypertrophy and disc protrusion. There is moderately severe left foraminal and lateral recess stenosis potential for left L4 or L5 nerve root compression. 2. At L3-L4 and L5-S1 there is facet hypertrophy but no significant disc degenerative changes and no nerve root impingement.

## 2016-07-04 NOTE — Telephone Encounter (Signed)
She is still trying to get approved for disability and has no insurance.  She has been applying for financial assistance w/ [facility] and [facility] each time she needs an appt or procedure.  She is going to try oral medications and back stretches for now.  She is going to call her financial aid resources to see if there is any assistance available for an ESI.

## 2016-08-08 ENCOUNTER — Telehealth: Payer: Self-pay | Admitting: *Deleted

## 2016-08-08 NOTE — Telephone Encounter (Signed)
Pt returned RN's call °

## 2016-08-08 NOTE — Telephone Encounter (Signed)
Left message for a return call.  I need to discuss her follow up appointment.  Per Dr. Terrace Arabia, she will need to see her in February 2018.  She will need to have a MRI brain w/wo prior to this appointment.

## 2016-08-08 NOTE — Telephone Encounter (Signed)
Spoke to patient - follow up scheduled for 01/30/16 - MRI will be completed prior to this appointment.

## 2016-08-16 ENCOUNTER — Telehealth: Payer: Self-pay | Admitting: Neurology

## 2016-08-17 NOTE — Telephone Encounter (Signed)
error 

## 2016-08-28 ENCOUNTER — Telehealth: Payer: Self-pay | Admitting: Neurology

## 2016-08-28 NOTE — Telephone Encounter (Signed)
Returned call to [employer] at [employer] to clarify Tysabri question.

## 2016-08-28 NOTE — Telephone Encounter (Signed)
[employer] [phone] ext [phone] called to advise, received Tysabri Re-Authorization questionnaire, one question has conflicting information; Question F, presence of JCV Antibody, not tested since March 2017, gave test result, needs to know if not tested or from prior test? Please call to advise.

## 2016-09-28 ENCOUNTER — Telehealth: Payer: Self-pay | Admitting: *Deleted

## 2016-09-28 ENCOUNTER — Telehealth: Payer: Self-pay | Admitting: Neurology

## 2016-09-28 MED ORDER — CANAGLIFLOZIN-METFORMIN HCL 50-1000 MG PO TABS: 1.0000 | ORAL_TABLET | Freq: Two times a day (BID) | ORAL | 12 refills | Status: DC

## 2016-09-28 NOTE — Telephone Encounter (Signed)
Patient called requesting to speak with nurse regarding prescriptions for medications, has no insurance, can't go to PCP, needs diabetes medication refilled INVOKAMET, states diabetes contributed to her MS. Please call to advise.

## 2016-09-28 NOTE — Telephone Encounter (Signed)
Received VM from patient.   Reports that she no longer has medical or prescription insurance and has not been taking Invokamet.   Requested prescription assistance form to be completed for DM meds.   Provider section of forms completed and awaiting signature.   Patient will need to complete her section of form.   Call placed to patient. LMTRC.

## 2016-09-28 NOTE — Telephone Encounter (Signed)
I have spoken with Kristain this morning.  She sts. she is waiting to hear back from her pcp--she has requested a hard copy of her Invokana rx., and paperwork completed for pt. assistance for Invokana. I have explained that YY does not manage diabetes--and would not be able to take over rx's for diabetes, also is not able to complete pt. assistance paperwork for diabetes meds.  I have referred her to [facility]'s website to complete charity care paperwork/fim

## 2016-09-28 NOTE — Telephone Encounter (Signed)
Patient called back and stated that she has her portion of the paperwork. She was going to fill it out and get the tax information together and once she picked up the form from [place] she was going to drop them in the mail.   CB# [phone]

## 2016-09-29 NOTE — Telephone Encounter (Signed)
Received forms from provider.   Advised that patient has not had OV since January 2016. Patient requires OV with routine labs before forms can be completed.   Call placed to patient and patient made aware.   Patient has questions about [place] patient assistance with medical bills.   Please call to discuss. [phone].

## 2016-10-03 ENCOUNTER — Telehealth: Payer: Self-pay | Admitting: Neurology

## 2016-10-03 NOTE — Telephone Encounter (Signed)
Pt came by checking status of [facility] Assistance application. She is requesting an update return call.

## 2016-10-09 NOTE — Telephone Encounter (Signed)
We do participate in the [facility] Patient assistance program. I have left vm asking pt to call me back so I can advise.

## 2016-10-11 ENCOUNTER — Ambulatory Visit: Payer: Self-pay | Admitting: Family Medicine

## 2016-12-20 ENCOUNTER — Other Ambulatory Visit: Payer: Self-pay | Admitting: *Deleted

## 2016-12-20 DIAGNOSIS — G35 Multiple sclerosis: Secondary | ICD-10-CM

## 2016-12-29 ENCOUNTER — Ambulatory Visit
Admission: RE | Admit: 2016-12-29 | Discharge: 2016-12-29 | Disposition: A | Discharge status: Home / Self Care | Payer: No Typology Code available for payment source | Source: Ambulatory Visit | Attending: Neurology | Admitting: Neurology

## 2016-12-29 DIAGNOSIS — G35 Multiple sclerosis: Secondary | ICD-10-CM

## 2016-12-29 MED ORDER — GADOBENATE DIMEGLUMINE 529 MG/ML IV SOLN
20.0000 mL | Freq: Once | INTRAVENOUS | Status: AC | PRN
  Administered 2016-12-29: 20 mL via INTRAVENOUS

## 2017-01-01 ENCOUNTER — Telehealth: Payer: Self-pay | Admitting: Neurology

## 2017-01-01 NOTE — Telephone Encounter (Signed)
Called patient and left voicemail with results (ok per DPR).  Provided our number to call with any questions.

## 2017-01-01 NOTE — Telephone Encounter (Signed)
Please call patient, MRI of the brain showed continued evidence of relapsing remitting multiple sclerosis, no change compared to previous study

## 2017-01-29 ENCOUNTER — Ambulatory Visit (INDEPENDENT_AMBULATORY_CARE_PROVIDER_SITE_OTHER): Payer: Self-pay | Admitting: Neurology

## 2017-01-29 ENCOUNTER — Encounter: Payer: Self-pay | Admitting: Neurology

## 2017-01-29 VITALS — BP 127/81 | HR 117 | Ht 66.0 in | Wt 289.0 lb

## 2017-01-29 DIAGNOSIS — G35 Multiple sclerosis: Secondary | ICD-10-CM

## 2017-01-29 DIAGNOSIS — F329 Major depressive disorder, single episode, unspecified: Secondary | ICD-10-CM | POA: Insufficient documentation

## 2017-01-29 DIAGNOSIS — M5416 Radiculopathy, lumbar region: Secondary | ICD-10-CM

## 2017-01-29 DIAGNOSIS — F330 Major depressive disorder, recurrent, mild: Secondary | ICD-10-CM

## 2017-01-29 DIAGNOSIS — G43009 Migraine without aura, not intractable, without status migrainosus: Secondary | ICD-10-CM

## 2017-01-29 DIAGNOSIS — F32A Depression, unspecified: Secondary | ICD-10-CM | POA: Insufficient documentation

## 2017-01-29 MED ORDER — VENLAFAXINE HCL ER 37.5 MG PO CP24: 75.0000 mg | ORAL_CAPSULE | Freq: Every day | ORAL | 11 refills | Status: DC

## 2017-01-29 MED ORDER — RIZATRIPTAN BENZOATE 10 MG PO TBDP: 10.0000 mg | ORAL_TABLET | ORAL | 11 refills | Status: DC | PRN

## 2017-01-29 MED ORDER — MELOXICAM 15 MG PO TABS: 15.0000 mg | ORAL_TABLET | Freq: Every day | ORAL | 11 refills | Status: DC

## 2017-01-29 NOTE — Progress Notes (Signed)
Chief Complaint  Patient presents with  . Multiple Sclerosis    She has continued her therapy with Tysabri.  She is having more difficulty with muscle spasms.  She is also concerned about depression and irritability.  She has decreased sensations in her left leg, hip and foot. She is biting her tongue frequently during the night and is now have to use a mouth guard.  She has started having slurred speech.  . Migraine    She is getting several migraines each week.  Episodes are causing more blurred vision.  . Back Pain/Left Hip    Her chronic back and left hip pain is causing her more difficulty with gait.  The pain is constant and she is not able to get comfortable in any position.  . Memory Loss    MMSE - animals.  She is having problems with word finding and short-term memory.      PATIENT: Katrina Montes DOB: [birth year]  HISTORICAL (Initial visit Jan 2016)  Katrina Montes is 46 right-handed African-American female, accompanied by her mother, referred by her primary care physician Dr. 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,  In May of 2015, she had transient right eye blurry vision, was considered due to her high glucose, recovered within a few weeks  Since January 2016, she began to experience intermittent vertigo, dizziness, also had worsening right eye blurry vision,, color washing out, could only see her rim at her right peripheral visual field, she denies significant eye pain  MRI of the brain without contrast at [facility] January 2016 showed multiple periventricular white matter disease, significant involvement of corpus callosum, perpendicular to ventricle, consistent with multiple sclerosis, now was  also suspicious for right optic nerve edema, with hyperintensity signal at T2, FLAIR,  She was treated with IV steroid in January 15, 18, 19, only has mild improvement of her right vision, has significant worsening of her blood glucose level.  Spinal fluid testing January 13 2015, total protein was 708, RBC was 1600, RBC was 7,, cloudy reddish-looking,  Laboratory showed anemia, hemoglobin 11.8, otherwise normal CBC, normal CMP, with exception of elevated glucose, normal TSH,  UPDATE Feb 22nd 2016: Acthar injection training today, she still has right blurry vision, mild improvement, mild unsteady gait, her diabetes is under suboptimal control,  We have reviewed MRI of the cervical spine with and without contrast in Jan 2016: showing hyperintense foci within the spinal cord posteriorly at C4 and posteriolaterally at C7-T1 consistent with multiple sclerosis plaques. There were no enhancing foci.  There was no enhancing lesions at MRI of the brain, MRA of the brain was normal,   JC virus was positive with titer of 1.1 3 (Jan 14 2015),  laboratory evaluation showed normal or negative CPK, HIV, protein electrophoresis, positive varicella-zoster virus antibody, mild elevated ESR, C-reactive protein, hepatitis panel, Lyme titer,   Visual evoked potential showed prolongation on the right side.  UPDATE March 22nd 2016: She got first dose Tysarbril March 14th, 2016 doing well, she received 1 round of Acthar injection Feb 17th 2016, right eye vision has much improved, she complains of severe body achy pain, worsening left-sided low back pain, radiating pain to left lower extremity, recent onset worsening  constipation, mild gait difficulty  UPDATE March 30 2015: I met patient at IV infusion suites today, she is tolerating Tysarbri IV infusion, no significant side effect noticed, no flareup of her MS symptoms,  She has been complaining worsening headache over the past 1 months, lateralized severe  pounding headache with associated light noise sensitivity, I have called in Maxalt, which help her severe headaches, in addition, she has mild bilateral frontal pounding headaches, happen frequently  UPDATE June 17 2015:  She tolerated to Tysabri well, she continue complains of mild blurry vision, gait difficulty, bilateral lower extremity spasticity, muscle tightness, left foot muscle cramping She still has frequent headaches 3-4 times each week , Maxalt works well, she also complains of excessive fatigue, worsening by hot weather, depression anxiety, word finding difficulties, stuttering of speech, Insomnia  UPDATE July 21 2015: She is here for [employer] 9-21 screening visit, she is tolerating baclofen 10 mg 3 times a day, no longer taking pain medications, Her depression has much improved  Update February 22 2016: She complains severe left-sided low back pain, radiating pain to left hip, had a history of previous high-dose prednisone treatment, obesity, diabetic  UPDATE Jan 29 2017: She is accompanied by her mother at today's clinical visit, MS wise she is stable, we have personally reviewed MRI of the brain with and without contrast  in January 2018, supratentorium lesions, no contrast enhancement,no change compared to previous study in 2016.  She complains of excessive stress, increased migraine up to 3 times a week, responding well to Maxalt, also complains of worsening low ba pain, radiating pain to left lower extremity, taking diclofenac,Mobic, and the multiple dose of ibuprofen, she also complains of worsening depression, she is frustrated about her disability application process.  We have personally reviewed MRI of lumbar in July 201 there is minimum anterolisthesis of L4 upon L5, due to severe facet hypertrophy, associated with left ligamentum flavum hypertrophy and disc protrusion, there is moderate  Left foraminal and lateral recess stenosis, potential for left L4-5 nerve root  compression.  REVIEW OF SYSTEMS: Full 14 system review of systems performed and notable only for as above  ALLERGIES: No Known Allergies  HOME MEDICATIONS: Current Outpatient Prescriptions  Medication Sig  . baclofen (LIORESAL) 10 MG tablet Take 1 tablet (10 mg total) by mouth 3 (three) times daily.  . Calcium-Magnesium-Zinc (CALCIUM-MAGNESUIUM-ZINC PO) Take by mouth.  . Canagliflozin-Metformin HCl 50-1000 MG TABS Take 1 tablet by mouth 2 (two) times daily.  . cholecalciferol (VITAMIN D) 1000 UNITS tablet Take 1,000 Units by mouth 2 (two) times daily.  . diclofenac (VOLTAREN) 75 MG EC tablet TAKE 1 TABLET (75 MG TOTAL) BY MOUTH 2 (TWO) TIMES DAILY as needed  . docusate sodium (COLACE) 50 MG capsule Take 1 capsule (50 mg total) by mouth 2 (two) times daily.  . DULoxetine (CYMBALTA) 60 MG capsule Take 1 capsule (60 mg total) by mouth daily.  Marland Kitchen glucose blood (ACCU-CHEK AVIVA PLUS) test strip Monitor FSBS 1x daily fasting. Dx: 250.00  . lisinopril-hydrochlorothiazide (PRINZIDE,ZESTORETIC) 10-12.5 MG per tablet TAKE 1 TABLET BY MOUTH DAILY. BLOOD PRESSURE  . meclizine (ANTIVERT) 25 MG tablet Take 1 tablet (25 mg total) by mouth 3 (three) times daily as needed for dizziness.  . natalizumab (TYSABRI) 300 MG/15ML injection Inject 300 mg into the vein. Infuse '300mg'$  IV every 28 days.  . rizatriptan (MAXALT-MLT) 10 MG disintegrating tablet Take 1 tablet (10 mg total) by mouth as needed. May repeat in 2 hours if needed  .  topiramate (TOPAMAX) 100 MG tablet Take 1 tablet (100 mg total) by mouth 2 (two) times daily.  . traZODone (DESYREL) 50 MG tablet Take 1 tablet (50 mg total) by mouth at bedtime.  Ortho-cyclen  PAST MEDICAL HISTORY: Past Medical History  Diagnosis Date  . Diabetes mellitus   . Anemia   . Hypertension   . Carpal tunnel syndrome   . Multiple sclerosis     PAST SURGICAL HISTORY: Past Surgical History  Procedure Laterality Date  . Ovarian Cyst excision  In 1996    . Breast  reduction surgery in 2005     wisdom teeth extraction   In 1991.  FAMILY HISTORY: Family History  Problem Relation Age of Onset  . Cancer Mother   . Diabetes Mother   . Hearing loss Mother   . Thyroid nodules Mother   . Anemia Mother   . Kidney disease Mother   . Hypertension Mother   . Hyperlipidemia Father   . Anemia Sister   . Arthritis Maternal Grandmother   . Arthritis Maternal Grandfather   . Arthritis Paternal Grandmother   . Kidney disease Paternal Grandmother   . Hypertension Paternal Grandmother   . Arthritis Paternal Grandfather   . Vision loss Paternal Grandfather   . Stroke Paternal Grandfather   . Hyperlipidemia Paternal Grandfather   . Anemia Sister     SOCIAL HISTORY:  Social History   Social History  . Marital status: Single    Spouse name: N/A  . Number of children: 0  . Years of education: College   Occupational History  . unemployed    Social History Main Topics  . Smoking status: Never Smoker  . Smokeless tobacco: Never Used  . Alcohol use No  . Drug use: No  . Sexual activity: Not Currently    Birth control/ protection: Pill   Other Topics Concern  . Not on file   Social History Narrative   Live at home with parents.   Right handed.   Uses caffeine sparingly.     PHYSICAL EXAM   Vitals:   01/29/17 1515  BP: 127/81  Pulse: (!) 117  Weight: 289 lb (131.1 kg)  Height: '5\' 6"'$  (1.676 m)    Not recorded      Body mass index is 46.65 kg/m.    PHYSICAL EXAMNIATION:  Gen: NAD, conversant, well nourised, obese, well groomed                     Cardiovascular: Regular rate rhythm, no peripheral edema, warm, nontender. Eyes: Conjunctivae clear without exudates or hemorrhage Neck: Supple, no carotid bruise. Pulmonary: Clear to auscultation bilaterally   NEUROLOGICAL EXAM:  MENTAL STATUS: Speech:    Speech is normal; fluent and spontaneous with normal comprehension.  Cognition:    The patient is oriented to person,  place, and time;     recent and remote memory intact;     language fluent;     normal attention, concentration,     fund of knowledge.  CRANIAL NERVES: CN II: Visual fields are full to confrontation. Fundoscopic exam is normal with sharp discs and no vascular changes. Pupils were equal round reactive to light CN III, IV, VI: extraocular movement are normal. No ptosis. CN V: Facial sensation is intact to pinprick in all 3 divisions bilaterally. Corneal responses are intact.  CN VII: Face is symmetric with normal eye closure and smile. CN VIII: Hearing is normal to rubbing fingers CN IX, X: Palate elevates  symmetrically. Phonation is normal. CN XI: Head turning and shoulder shrug are intact CN XII: Tongue is midline with normal movements and no atrophy.  MOTOR: She has mild bilateral lower extremity spasticity, no significant weakness, normal upper extremity bulk and strength.  REFLEXES: Reflexes are 2+ and symmetric at the biceps, triceps, 3/3 knees, and ankles. Plantar responses are flexor.   SENSORY: Light touch, pinprick, position sense, and vibration sense are intact in fingers and toes.  COORDINATION: Rapid alternating movements and fine finger movements are intact. There is no dysmetria on finger-to-nose and heel-knee-shin. There are no abnormal or extraneous movements.   GAIT/STANCE: Mildly stiff, cautious, difficulty with tandem walking  DIAGNOSTIC DATA (LABS, IMAGING, TESTING) - I reviewed patient records, labs, notes, testing and imaging myself where available.  Lab Results  Component Value Date   WBC 9.3 12/28/2015   HGB 11.1 (L) 11/02/2014   HCT 37.2 12/28/2015   MCV 80 12/28/2015   PLT 309 12/28/2015      Component Value Date/Time   NA 139 12/28/2015 1126   K 4.5 12/28/2015 1126   CL 102 12/28/2015 1126   CO2 23 12/28/2015 1126   GLUCOSE 202 (H) 12/28/2015 1126   GLUCOSE 208 (H) 11/02/2014 0944   BUN 11 12/28/2015 1126   CREATININE 0.66 12/28/2015 1126     CREATININE 0.70 11/02/2014 0944   CALCIUM 9.1 12/28/2015 1126   PROT 7.1 12/28/2015 1126   ALBUMIN 4.0 12/28/2015 1126   AST 13 12/28/2015 1126   ALT 16 12/28/2015 1126   ALKPHOS 78 12/28/2015 1126   BILITOT 0.3 12/28/2015 1126   GFRNONAA 109 12/28/2015 1126   GFRAA 125 12/28/2015 1126   Lab Results  Component Value Date   CHOL 208 (H) 11/02/2014   HDL 66 11/02/2014   LDLCALC 119 (H) 11/02/2014   TRIG 114 11/02/2014   CHOLHDL 3.2 11/02/2014   Lab Results  Component Value Date   HGBA1C 7.7 (H) 06/17/2015   No results found for: [id number] Lab Results  Component Value Date   TSH 1.281 07/28/2014     Radiology: MRI brain in January 2018: Moderately advanced predominantly periventricular white matter signal abnormality with significant involvement of the corpus callosum. Some lesions are radially arranged perpendicular to that structure. Some lesions have not ovoid configuration. There is T2 and FLAIR hyperintensity of callosal septal interface. Brainstem involvement is observed in the BILATERAL LEFT greater than RIGHT cerebral peduncles, no change compared to previous study in 2016.  MRI cervical in Feb 2nd 2016: This is an abnormal MRI of the cervical spine with and without contrast showing hyperintense foci within the spinal cord posteriorly at C4 and posteriolaterally at C7-T1 consistent with multiple sclerosis plaques. There were no enhancing foci.  CSF study in January 2016, elevated IgG index, negative oligoclonal banding  JC virus-Ab:  positive with titer of 1.1 3 (Jan 14 2015)  Treatment: Tysabri infusion since March 01 2015  ASSESSMENT AND PLAN  Alexanderia Wixon is a 46 y.o. female  Relapsing remitting multiple sclerosis  Continued Tysarbri infusion Left lumbar radiculopathy  I have referred her to pain management,  Worsening depression,migraine headaches  Add on Effexor 37.5 mg daily, continue Topamax 100 mg twice a day  relpax as needed  Marcial Pacas, M.D.  Ph.D.  [facility] [address], [place], [place] [address] [phone]

## 2017-08-23 ENCOUNTER — Telehealth: Payer: Self-pay | Admitting: Neurology

## 2017-08-23 NOTE — Telephone Encounter (Signed)
Debbie @ [employer] has called re: the Re-authorization questionnaire, it was received only partially filled out.It needs to be completely filled out, pt authorization is about to run out.  Eunice Blase states she just re faxed another over to [phone], if she needs to be called she can be reached at [phone] xt [phone]

## 2017-08-23 NOTE — Telephone Encounter (Signed)
Paperwork received, completed and faxed back.

## 2017-08-30 ENCOUNTER — Telehealth: Payer: Self-pay | Admitting: Neurology

## 2017-08-30 MED ORDER — RIZATRIPTAN BENZOATE 10 MG PO TABS: 10.0000 mg | ORAL_TABLET | ORAL | 3 refills | Status: DC | PRN

## 2017-08-30 NOTE — Telephone Encounter (Signed)
Pt is asking for a refill of rizatriptan (MAXALT-MLT) 10 MG disintegrating tablet.  She has checked with both [facility] and [facility] and neither have record of ever filling this medication.  Pt would be okay with it being called into either of the 2 for the least amount of money.  Please call

## 2017-08-30 NOTE — Telephone Encounter (Signed)
Pt has no insurance coverage for prescriptions.  Per GoodRx, the least expensive way to get rizatriptan is getting regular tablets filled at [facility] (90-day is best value).  New rx sent to pharmacy with GoodRx coupon attached (#45 for $47.27)

## 2017-08-30 NOTE — Addendum Note (Signed)
Addended by: Lindell Spar C on: 08/30/2017 01:24 PM   Modules accepted: Orders

## 2017-08-30 NOTE — Telephone Encounter (Signed)
error 

## 2017-09-05 ENCOUNTER — Encounter: Payer: Self-pay | Admitting: *Deleted

## 2017-09-05 ENCOUNTER — Telehealth: Payer: Self-pay

## 2017-09-05 ENCOUNTER — Telehealth: Payer: Self-pay | Admitting: Neurology

## 2017-09-05 NOTE — Telephone Encounter (Signed)
Pt calling to inform that she just received the leter from Disability re: a hearing.  Pt is looking for an attorney, they are asking for a letter from pt's Neurologist  Re: her condition.  Please call pt

## 2017-09-05 NOTE — Telephone Encounter (Signed)
Patient called lvm  In regards to her getting disability and needing records. Please call to discuss

## 2017-09-05 NOTE — Telephone Encounter (Signed)
Letter is ready - patient will come to our office to pick up.

## 2017-09-06 NOTE — Telephone Encounter (Signed)
Call placed to patient.   Left message on VM advising that [employer] will send records once request has been received.   Advised to have disability request faxed to office.

## 2017-09-27 ENCOUNTER — Telehealth: Payer: Self-pay | Admitting: Family Medicine

## 2017-09-27 NOTE — Telephone Encounter (Signed)
Pt called needing [place] to write a letter for her to get her disability approved, letter needs to state that she was diagnosed with MS, and all of the other things that have contributed to it. Please call if you need further info.

## 2017-09-30 NOTE — Telephone Encounter (Signed)
This letter would have to come from her neurologist and the treating physician and who diagnosed her.  I have not seen her in > 1 year

## 2017-10-01 NOTE — Telephone Encounter (Signed)
She can schedule an office visit and bring in any forms and need for documentation

## 2017-10-01 NOTE — Telephone Encounter (Signed)
Called pt made aware of recommendations - she states that you were the first one to dx her and sent her for steroid shots the same day you did the MRI.

## 2017-10-02 NOTE — Telephone Encounter (Signed)
Call placed to patient and patient made aware per VM.  

## 2018-02-26 ENCOUNTER — Telehealth: Payer: Self-pay | Admitting: Neurology

## 2018-02-26 ENCOUNTER — Other Ambulatory Visit: Payer: Self-pay | Admitting: *Deleted

## 2018-02-26 ENCOUNTER — Other Ambulatory Visit: Payer: Self-pay

## 2018-02-26 DIAGNOSIS — G35 Multiple sclerosis: Secondary | ICD-10-CM | POA: Diagnosis not present

## 2018-02-26 NOTE — Telephone Encounter (Signed)
02/26/18 Medicaid order sent to [facility]

## 2018-02-27 LAB — COMPREHENSIVE METABOLIC PANEL
A/G RATIO: 1.3 (ref 1.2–2.2)
ALBUMIN: 3.9 g/dL (ref 3.5–5.5)
ALT: 8 IU/L (ref 0–32)
AST: 12 IU/L (ref 0–40)
Alkaline Phosphatase: 52 IU/L (ref 39–117)
BILIRUBIN TOTAL: 0.3 mg/dL (ref 0.0–1.2)
BUN / CREAT RATIO: 13 (ref 9–23)
BUN: 8 mg/dL (ref 6–24)
CALCIUM: 9 mg/dL (ref 8.7–10.2)
CHLORIDE: 101 mmol/L (ref 96–106)
CO2: 21 mmol/L (ref 20–29)
Creatinine, Ser: 0.61 mg/dL (ref 0.57–1.00)
GFR, EST AFRICAN AMERICAN: 127 mL/min/{1.73_m2} (ref >59)
GFR, EST NON AFRICAN AMERICAN: 110 mL/min/{1.73_m2} (ref >59)
GLOBULIN, TOTAL: 2.9 g/dL (ref 1.5–4.5)
Glucose: 180 mg/dL — ABNORMAL HIGH (ref 65–99)
POTASSIUM: 4.3 mmol/L (ref 3.5–5.2)
SODIUM: 136 mmol/L (ref 134–144)
TOTAL PROTEIN: 6.8 g/dL (ref 6.0–8.5)

## 2018-02-27 LAB — CBC WITH DIFFERENTIAL/PLATELET
BASOS: 0 %
Basophils Absolute: 0 10*3/uL (ref 0.0–0.2)
EOS (ABSOLUTE): 0.1 10*3/uL (ref 0.0–0.4)
Eos: 1 %
Hematocrit: 33.3 % — ABNORMAL LOW (ref 34.0–46.6)
Hemoglobin: 10.6 g/dL — ABNORMAL LOW (ref 11.1–15.9)
IMMATURE GRANS (ABS): 0 10*3/uL (ref 0.0–0.1)
Immature Granulocytes: 0 %
LYMPHS ABS: 2.1 10*3/uL (ref 0.7–3.1)
LYMPHS: 31 %
MCH: 25.9 pg — AB (ref 26.6–33.0)
MCHC: 31.8 g/dL (ref 31.5–35.7)
MCV: 81 fL (ref 79–97)
Monocytes Absolute: 0.4 10*3/uL (ref 0.1–0.9)
Monocytes: 6 %
NEUTROS ABS: 4.1 10*3/uL (ref 1.4–7.0)
Neutrophils: 62 %
PLATELETS: 288 10*3/uL (ref 150–379)
RBC: 4.1 x10E6/uL (ref 3.77–5.28)
RDW: 16.6 % — ABNORMAL HIGH (ref 12.3–15.4)
WBC: 6.7 10*3/uL (ref 3.4–10.8)

## 2018-02-27 LAB — TSH: TSH: 1.25 u[IU]/mL (ref 0.450–4.500)

## 2018-03-04 ENCOUNTER — Telehealth: Payer: Self-pay | Admitting: *Deleted

## 2018-03-04 NOTE — Telephone Encounter (Signed)
Labs collected on 02/26/18:  JCV positive 1.01  Printed result provided to Dr. Terrace Arabia and will be sent for scanning.

## 2018-03-28 ENCOUNTER — Ambulatory Visit
Admission: RE | Admit: 2018-03-28 | Discharge: 2018-03-28 | Disposition: A | Discharge status: Home / Self Care | Payer: Self-pay | Source: Ambulatory Visit | Attending: Neurology | Admitting: Neurology

## 2018-03-28 DIAGNOSIS — G35 Multiple sclerosis: Secondary | ICD-10-CM

## 2018-03-28 MED ORDER — GADOBENATE DIMEGLUMINE 529 MG/ML IV SOLN
20.0000 mL | Freq: Once | INTRAVENOUS | Status: AC | PRN
  Administered 2018-03-28: 20 mL via INTRAVENOUS

## 2018-04-01 ENCOUNTER — Telehealth: Payer: Self-pay | Admitting: Neurology

## 2018-04-01 NOTE — Telephone Encounter (Signed)
Please call patient, MRI showed multiple MS lesions, there was no acute change compared to previous scan in January 2018.  IMPRESSION: This MRI of the brain with and without contrast shows the following: 1. Multiple T2/FLAIR hyperintense foci in the brainstem and the periventricular, juxtacortical and deep white matter of both hemispheres in a pattern and configuration consistent with chronic demyelinating plaques associated with multiple sclerosis. None of the foci appears to be acute. When compared to the MRI dated 12/29/2016, there is no interval change except chronic paranasal sinusitis changes which appear new.  2. There is a normal enhancement pattern and there are no acute findings.

## 2018-04-01 NOTE — Telephone Encounter (Signed)
Spoke to patient she is aware of results

## 2018-04-04 ENCOUNTER — Encounter: Payer: Self-pay | Admitting: Neurology

## 2018-04-04 ENCOUNTER — Ambulatory Visit (INDEPENDENT_AMBULATORY_CARE_PROVIDER_SITE_OTHER): Payer: Self-pay | Admitting: Neurology

## 2018-04-04 VITALS — BP 145/87 | HR 96 | Ht 66.0 in | Wt 274.5 lb

## 2018-04-04 DIAGNOSIS — M5416 Radiculopathy, lumbar region: Secondary | ICD-10-CM

## 2018-04-04 DIAGNOSIS — R269 Unspecified abnormalities of gait and mobility: Secondary | ICD-10-CM

## 2018-04-04 DIAGNOSIS — H469 Unspecified optic neuritis: Secondary | ICD-10-CM

## 2018-04-04 DIAGNOSIS — R5383 Other fatigue: Secondary | ICD-10-CM

## 2018-04-04 DIAGNOSIS — F329 Major depressive disorder, single episode, unspecified: Secondary | ICD-10-CM

## 2018-04-04 DIAGNOSIS — G35 Multiple sclerosis: Secondary | ICD-10-CM

## 2018-04-04 DIAGNOSIS — G43009 Migraine without aura, not intractable, without status migrainosus: Secondary | ICD-10-CM

## 2018-04-04 DIAGNOSIS — F32A Depression, unspecified: Secondary | ICD-10-CM

## 2018-04-04 MED ORDER — MECLIZINE HCL 25 MG PO TABS: 25.0000 mg | ORAL_TABLET | Freq: Three times a day (TID) | ORAL | 6 refills | Status: DC | PRN

## 2018-04-04 NOTE — Progress Notes (Signed)
Chief Complaint  Patient presents with  . Multiple Sclerosis    She is here with her mother, Katrina Montes. She has continued treatment with Tysabri.  She is having increased difficulty with the following:  vision loss, chronic pain, fatigue/stamina, hearing loss, cognitive issues, weakness, gait difficulty, numbness/tingling, anxiety/depression, stress, worsening neuropathy, insomnia, migraines, frequent urination, intermittent episodes of excessive sweating, speech/swallowing difficulty, nausea/GI upset.       PATIENT: Katrina Montes DOB: 1971-06-12  HISTORICAL (Initial visit Jan 2016)  Katrina Montes is 47 right-handed African-American female, accompanied by her mother, referred by her primary care physician Dr. Buelah Montes for evaluation of right optic neuritis, abnormal MRI of the brain, suspicious for relapsing remitting multiple sclerosis, initial evaluation was on Jan 46 2016.  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  Relapsing Remitting Multiple Sclerosis was diagnosed since January 2016: Based on her abnormal MRI of the brain, there was also cervical spinal cord involvement  MRI of the brain without contrast at [facility] imaging January 2016 showed multiple periventricular white matter disease, significant involvement of corpus callosum, perpendicular to ventricle, consistent with multiple sclerosis, now was also suspicious for right optic nerve edema, with hyperintensity signal at T2, FLAIR, no contrast enhancement, MRA of  brain was normal.  MRI of the cervical spine with and without contrast in Jan 2016: showing hyperintense foci within the spinal cord posteriorly at C4 and posteriolaterally at C7-T1 consistent with multiple sclerosis plaques. There were no enhancing foci.  Spinal fluid testing January 13 2015, total protein was 708, RBC was 1600, RBC was 7,, cloudy reddish-looking, elevated IgG 17.7, IgG index 0.97, IgG synthetic rate 238, 0 oligoclonal bands,  Visual evoked potential showed prolongation on the right side.  She was treated with IV Solu-Medrol for 3 days in Jan 2016, which only mild improved her right vision, has significant worsening of her blood glucose level.She was treated with Achtar injection in Feb 2016, but still having significant residual visual difficulty.   JC virus was positive with titer of 1.1 3 (Jan 14 2015),  laboratory evaluation showed normal or negative CPK, HIV, protein electrophoresis, positive varicella-zoster virus antibody, mild elevated ESR, C-reactive protein, hepatitis panel, Lyme titer, showed anemia, hemoglobin 11.8, otherwise normal CBC, normal CMP, with exception of elevated glucose, normal TSH,  She was treated with Tysarbril since March 14th, 2016,   Most recent repeat MRI of the brain with and without contrast in April 2019 showed multiple supratentorium lesions, no contrast enhancement, no significant change compared to previous MRI of the brain  She continues to have significant limitations  Vision: History of right optic neuritis, with partial recovery, now complains of blurry vision, difficult to read anything without bleeders or magnified glasses, difficult to read through computer screen or watch TV, visual acuity, OS 20/40, OD 20/50-1  Cognitive impairment: has to take frequent notes, difficulty to concentrate, to express herself.  Gait abnormality: Treated easily, generalized weakness, also complicated by her low back pain, left sided radicular  pain,  Spasticity: Bilateral  lower extremity spasticity, is taking gabapentin 300 mg 3 times a day previously tried and failed baclofen  Fatigue: Severe fatigue, also complicated by her depression anxiety chronic insomnia, body achy pain, limit her daily activity more than 50%, she has heavy menstruation-like cycle, chronic anemia, recent hemoglobin was 10.6.   Chronic migraine: She also has history of chronic migraine headaches, retro-orbital area severe pounding headache with associated light noise sensitivity, and happened frequently, up to 3 to four times each week, Maxalt as needed was helpful,  Severe low back pain, radiating pain to left lower extremity,  MRI of lumbar in July 201 there is minimum anterolisthesis of L4 upon L5, due to severe facet hypertrophy, associated with left ligamentum flavum hypertrophy and disc protrusion, there is moderate  Left foraminal and lateral recess stenosis, potential for left L4-5 nerve root compression.  Diabetic peripheral neuropathy, Progressive worsening bilateral feet paresthesia, now taking insulin  Depression anxiety, chronic insomnia, polypharmacy treatment:  Trazodone 50 mg every night, gabapentin 300 mg 3 times a day, Topamax 100 mg twice a day, Flexeril as needed,  REVIEW OF SYSTEMS: Full 14 system review of systems performed and notable only for as above  ALLERGIES: No Known Allergies  HOME MEDICATIONS: Current Outpatient Prescriptions  Medication Sig  . baclofen (LIORESAL) 10 MG tablet Take 1 tablet (10 mg total) by mouth 3 (three) times daily.  . Calcium-Magnesium-Zinc (CALCIUM-MAGNESUIUM-ZINC PO) Take by mouth.  . Canagliflozin-Metformin HCl 50-1000 MG TABS Take 1 tablet by mouth 2 (two) times daily.  . cholecalciferol (VITAMIN D) 1000 UNITS tablet Take 1,000 Units by mouth 2 (two) times daily.  . diclofenac (VOLTAREN) 75 MG EC tablet TAKE 1 TABLET (75 MG TOTAL) BY MOUTH 2 (TWO) TIMES DAILY as needed  . docusate sodium (COLACE)  50 MG capsule Take 1 capsule (50 mg total) by mouth 2 (two) times daily.  . DULoxetine (CYMBALTA) 60 MG capsule Take 1 capsule (60 mg total) by mouth daily.  Marland Kitchen glucose blood (ACCU-CHEK AVIVA PLUS) test strip Monitor FSBS 1x daily fasting. Dx: 250.00  . lisinopril-hydrochlorothiazide (PRINZIDE,ZESTORETIC) 10-12.5 MG per tablet TAKE 1 TABLET BY MOUTH DAILY. BLOOD PRESSURE  . meclizine (ANTIVERT) 25 MG tablet Take 1 tablet (25 mg total) by mouth 3 (three) times daily as needed for dizziness.  . natalizumab (TYSABRI) 300 MG/15ML injection Inject 300 mg into the vein. Infuse '300mg'$  IV every 28 days.  . rizatriptan (MAXALT-MLT) 10 MG disintegrating tablet Take 1 tablet (10 mg total) by mouth as needed. May repeat in 2 hours if needed  . topiramate (TOPAMAX) 100 MG tablet Take 1 tablet (100 mg total) by mouth 2 (two) times daily.  . traZODone (DESYREL) 50 MG tablet Take 1 tablet (50 mg total) by mouth at bedtime.  Ortho-cyclen  PAST MEDICAL HISTORY: Past Medical History  Diagnosis Date  . Diabetes mellitus   . Anemia   . Hypertension   . Carpal tunnel syndrome   . Multiple sclerosis     PAST SURGICAL HISTORY: Past Surgical History  Procedure Laterality Date  . Ovarian Cyst excision  In 1996    . Breast reduction surgery in 2005     wisdom teeth extraction   In 1991.  FAMILY HISTORY: Family History  Problem Relation Age of Onset  . Cancer Mother   . Diabetes Mother   . Hearing loss Mother   . Thyroid nodules Mother   . Anemia Mother   . Kidney disease Mother   . Hypertension Mother   . Hyperlipidemia  Father   . Anemia Sister   . Arthritis Maternal Grandmother   . Arthritis Maternal Grandfather   . Arthritis Paternal Grandmother   . Kidney disease Paternal Grandmother   . Hypertension Paternal Grandmother   . Arthritis Paternal Grandfather   . Vision loss Paternal Grandfather   . Stroke Paternal Grandfather   . Hyperlipidemia Paternal Grandfather   . Anemia Sister     SOCIAL  HISTORY:  Social History   Socioeconomic History  . Marital status: Single    Spouse name: Not on file  . Number of children: 0  . Years of education: College  . Highest education level: Not on file  Occupational History  . Occupation: unemployed  Social Needs  . Financial resource strain: Not on file  . Food insecurity:    Worry: Not on file    Inability: Not on file  . Transportation needs:    Medical: Not on file    Non-medical: Not on file  Tobacco Use  . Smoking status: Never Smoker  . Smokeless tobacco: Never Used  Substance and Sexual Activity  . Alcohol use: No  . Drug use: No  . Sexual activity: Not Currently    Birth control/protection: Pill  Lifestyle  . Physical activity:    Days per week: Not on file    Minutes per session: Not on file  . Stress: Not on file  Relationships  . Social connections:    Talks on phone: Not on file    Gets together: Not on file    Attends religious service: Not on file    Active member of club or organization: Not on file    Attends meetings of clubs or organizations: Not on file    Relationship status: Not on file  . Intimate partner violence:    Fear of current or ex partner: Not on file    Emotionally abused: Not on file    Physically abused: Not on file    Forced sexual activity: Not on file  Other Topics Concern  . Not on file  Social History Narrative   Live at home with parents.   Right handed.   Uses caffeine sparingly.     PHYSICAL EXAM   Vitals:   04/04/18 0850  BP: (!) 145/87  Pulse: 96  Weight: 274 lb 8 oz (124.5 kg)  Height: '5\' 6"'$  (1.676 m)    Not recorded      Body mass index is 44.31 kg/m.    PHYSICAL EXAMNIATION:  Gen: NAD, conversant, well nourised, obese, well groomed                     Cardiovascular: Regular rate rhythm, no peripheral edema, warm, nontender. Eyes: Conjunctivae clear without exudates or hemorrhage Neck: Supple, no carotid bruise. Pulmonary: Clear to auscultation  bilaterally   NEUROLOGICAL EXAM:  MENTAL STATUS: Speech:    Speech is normal; fluent and spontaneous with normal comprehension.  Cognition:    The patient is oriented to person, place, and time;     recent and remote memory intact;     language fluent;     normal attention, concentration,     fund of knowledge.  CRANIAL NERVES: CN II: Visual fields are full to confrontation. Fundoscopic exam is normal with sharp discs and no vascular changes. Pupils were equal round reactive to light CN III, IV, VI: extraocular movement are normal. No ptosis. CN V: Facial sensation is intact to pinprick in all 3 divisions  bilaterally. Corneal responses are intact.  CN VII: Face is symmetric with normal eye closure and smile. CN VIII: Hearing is normal to rubbing fingers CN IX, X: Palate elevates symmetrically. Phonation is normal. CN XI: Head turning and shoulder shrug are intact CN XII: Tongue is midline with normal movements and no atrophy.  MOTOR: She has mild bilateral lower extremity spasticity, no significant weakness, normal upper extremity bulk and strength.  REFLEXES: Reflexes are 2+ and symmetric at the biceps, triceps, 3/3 knees, and ankles. Plantar responses are flexor.   SENSORY: Light touch, pinprick, position sense, and vibration sense are intact in fingers and toes.  COORDINATION: Rapid alternating movements and fine finger movements are intact. There is no dysmetria on finger-to-nose and heel-knee-shin. There are no abnormal or extraneous movements.   GAIT/STANCE: Mildly stiff, cautious, difficulty with tandem walking  DIAGNOSTIC DATA (LABS, IMAGING, TESTING) - I reviewed patient records, labs, notes, testing and imaging myself where available.  Lab Results  Component Value Date   WBC 6.7 02/26/2018   HGB 10.6 (L) 02/26/2018   HCT 33.3 (L) 02/26/2018   MCV 81 02/26/2018   PLT 288 02/26/2018      Component Value Date/Time   NA 136 02/26/2018 1631   K 4.3 02/26/2018  1631   CL 101 02/26/2018 1631   CO2 21 02/26/2018 1631   GLUCOSE 180 (H) 02/26/2018 1631   GLUCOSE 208 (H) 11/02/2014 0944   BUN 8 02/26/2018 1631   CREATININE 0.61 02/26/2018 1631   CREATININE 0.70 11/02/2014 0944   CALCIUM 9.0 02/26/2018 1631   PROT 6.8 02/26/2018 1631   ALBUMIN 3.9 02/26/2018 1631   AST 12 02/26/2018 1631   ALT 8 02/26/2018 1631   ALKPHOS 52 02/26/2018 1631   BILITOT 0.3 02/26/2018 1631   GFRNONAA 110 02/26/2018 1631   GFRAA 127 02/26/2018 1631   Lab Results  Component Value Date   CHOL 208 (H) 11/02/2014   HDL 66 11/02/2014   LDLCALC 119 (H) 11/02/2014   TRIG 114 11/02/2014   CHOLHDL 3.2 11/02/2014   Lab Results  Component Value Date   HGBA1C 7.7 (H) 06/17/2015   No results found for: [id number] Lab Results  Component Value Date   TSH 1.250 02/26/2018     Radiology: MRI brain in April 2019: Multiple T2/FLAIR hyperintense foci in the brainstem and the periventricular, juxtacortical and deep white matter of both hemispheres in a pattern and configuration consistent with chronic demyelinating plaques associated with multiple sclerosis. None of the foci appears to be acute. When compared to the MRI dated 12/29/2016, there is no interval change except chronic paranasal sinusitis changes which appear new. There is no contrast enhancement.   MRI cervical in Feb 2nd 2016: This is an abnormal MRI of the cervical spine with and without contrast showing hyperintense foci within the spinal cord posteriorly at C4 and posteriolaterally at C7-T1 consistent with multiple sclerosis plaques. There were no enhancing foci.  CSF study in January 2016, elevated IgG index, negative oligoclonal banding  JC virus-Ab:  positive with titer of 1.1 3 (Jan 14 2015), 1.01, March 2019  Treatment: Tysabri infusion since March 01 2015  ASSESSMENT AND PLAN  Katrina Montes is a 47 y.o. female  Relapsing remitting multiple sclerosis  Continued Tysarbri infusion Cognitive  impairment Fatigue Gait abnormality Spasticity Diabetic peripheral neuropathy  Gabapentin 300 mg 3 times daily Cognitive impairment Chronic migraine headaches  Continue Topamax '100mg'$  bid  Maxalt as needed Depression anxiety, chronic insomnia, polypharmacy treatment  Trazodone 50  mg at bedtime  Effexor XR 75 mg daily Left lumbar radiculopathy  Mobic as needed   Marcial Pacas, M.D. Ph.D.  [facility] [address], [place], [place] [address] [phone]

## 2018-04-05 ENCOUNTER — Telehealth: Payer: Self-pay | Admitting: Neurology

## 2018-04-05 NOTE — Telephone Encounter (Signed)
Patient had questions about Medication assistant . I will refer her to [facility] patient assistance. Called and left patient a message stating I will call her back on Tues.

## 2018-04-09 ENCOUNTER — Telehealth: Payer: Self-pay | Admitting: Neurology

## 2018-04-09 NOTE — Telephone Encounter (Signed)
thanks

## 2018-04-09 NOTE — Telephone Encounter (Deleted)
°  °  I have called   to Dr. Kaurt Telephone [phone] fax [phone] . Cardiologist .   I relayed to them Patient needs to be seen with in one week . Patient is aware I have talked to him also.  °Dr. Sethi has ordered cardiac monitor for 4 weeks  And a echocardiogram .   °  °  ° ° ° ° ° ° ° °

## 2018-04-10 NOTE — Telephone Encounter (Signed)
error 

## 2018-04-10 NOTE — Telephone Encounter (Signed)
Called and left patient another message asking her to call me back.

## 2018-09-09 DIAGNOSIS — L03011 Cellulitis of right finger: Secondary | ICD-10-CM | POA: Diagnosis not present

## 2018-10-07 ENCOUNTER — Encounter: Payer: Self-pay | Admitting: Adult Health

## 2018-10-07 ENCOUNTER — Ambulatory Visit (INDEPENDENT_AMBULATORY_CARE_PROVIDER_SITE_OTHER): Payer: Medicare Other | Admitting: Adult Health

## 2018-10-07 VITALS — BP 144/81 | HR 74 | Ht 66.0 in | Wt 294.4 lb

## 2018-10-07 DIAGNOSIS — R5383 Other fatigue: Secondary | ICD-10-CM | POA: Diagnosis not present

## 2018-10-07 DIAGNOSIS — G35 Multiple sclerosis: Secondary | ICD-10-CM | POA: Diagnosis not present

## 2018-10-07 DIAGNOSIS — Z6841 Body Mass Index (BMI) 40.0 and over, adult: Secondary | ICD-10-CM | POA: Diagnosis not present

## 2018-10-07 DIAGNOSIS — G43009 Migraine without aura, not intractable, without status migrainosus: Secondary | ICD-10-CM | POA: Diagnosis not present

## 2018-10-07 MED ORDER — TOPIRAMATE 100 MG PO TABS: 100.0000 mg | ORAL_TABLET | Freq: Two times a day (BID) | ORAL | 3 refills | Status: DC

## 2018-10-07 MED ORDER — VENLAFAXINE HCL ER 37.5 MG PO CP24: 75.0000 mg | ORAL_CAPSULE | Freq: Every day | ORAL | 3 refills | Status: DC

## 2018-10-07 NOTE — Progress Notes (Signed)
PATIENT: Katrina Montes DOB: 1971/07/27  REASON FOR VISIT: follow up HISTORY FROM: patient  HISTORY OF PRESENT ILLNESS:  HISTORY Katrina Montes is 47 right-handed African-American female, accompanied by her mother, referred by her primary care physician Dr. Buelah Manis for evaluation of right optic neuritis, abnormal MRI of the brain, suspicious for relapsing remitting multiple sclerosis, initial evaluation was on Jan 14 2015.  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  Relapsing Remitting Multiple Sclerosis was diagnosed since January 2016: Based on her abnormal MRI of the brain, there was also cervical spinal cord involvement  MRI of the brain without contrast at [facility] imaging January 2016 showed multiple periventricular white matter disease, significant involvement of corpus callosum, perpendicular to ventricle, consistent with multiple sclerosis, now was also suspicious for right optic nerve edema, with hyperintensity signal at T2, FLAIR, no contrast enhancement, MRA of brain was normal.  MRI of the cervical spine with and without contrast in Jan 2016: showing hyperintense foci within the spinal cord posteriorly at C4 and posteriolaterally at C7-T1 consistent with multiple sclerosis plaques. There were no enhancing foci.  Spinal fluid testing January 13 2015, total protein was 708, RBC was 1600, RBC was 7,, cloudy reddish-looking, elevated IgG 17.7, IgG index 0.97, IgG synthetic rate 238, 0 oligoclonal  bands,  Visual evoked potential showed prolongation on the right side.  She was treated with IV Solu-Medrol for 3 days in Jan 2016, which only mild improved her right vision, has significant worsening of her blood glucose level.She was treated with Achtar injection in Feb 2016, but still having significant residual visual difficulty.   JC virus was positive with titer of 1.1 3 (Jan 14 2015),  laboratory evaluation showed normal or negative CPK, HIV, protein electrophoresis, positive varicella-zoster virus antibody, mild elevated ESR, C-reactive protein, hepatitis panel, Lyme titer, showed anemia, hemoglobin 11.8, otherwise normal CBC, normal CMP, with exception of elevated glucose, normal TSH,  She was treated with Tysarbril since March 14th, 2016,   Most recent repeat MRI of the brain with and without contrast in April 2019 showed multiple supratentorium lesions, no contrast enhancement, no significant change compared to previous MRI of the brain  She continues to have significant limitations  Vision: History of right optic neuritis, with partial recovery, now complains of blurry vision, difficult to read anything without bleeders or magnified glasses, difficult to read through computer screen or watch TV, visual acuity, OS 20/40, OD 20/50-1  Cognitive impairment: has to take frequent notes, difficulty to concentrate, to express herself.  Gait abnormality: Treated easily, generalized weakness, also complicated by her low back pain, left sided radicular pain,  Spasticity: Bilateral lower extremity spasticity, is taking gabapentin 300 mg 3 times a day previously tried and failed baclofen  Fatigue: Severe fatigue, also complicated by her depression anxiety chronic insomnia, body achy pain, limit her daily activity more than 50%, she has heavy menstruation-like cycle, chronic anemia, recent hemoglobin was 10.6.   Chronic migraine: She also has history of chronic migraine headaches,  retro-orbital area severe pounding headache with associated light noise sensitivity, and happened frequently, up to 3 to four times each week, Maxalt as needed was helpful,  Severe low back pain, radiating pain to left lower extremity,  MRI of lumbar in July 201 there is minimum anterolisthesis of L4 upon L5, due to severe facet hypertrophy, associated with left ligamentum flavum hypertrophy and disc protrusion, there is moderate  Left foraminal and lateral recess stenosis, potential for left L4-5 nerve root compression.  Diabetic peripheral neuropathy, Progressive worsening bilateral feet paresthesia, now taking insulin  Depression anxiety, chronic insomnia, polypharmacy treatment:  Trazodone 50 mg every night, gabapentin 300 mg 3 times a day, Topamax 100 mg twice a day, Flexeril as needed   Today 10/07/18:  Ms. Katrina Montes is a 47-year-old female with a history of multiple.  She returns today for follow-up.  She denies any significant changes with her gait or balance.  She continues to have pain in the left hip which affects her balance.  She denies any significant changes in her vision other than she has blurry vision at times.  In regards to bowel and bladder she reports increased urination and constipation.  She continues on Colace.  She reports that she continues to experience a significant amount of fatigue.  This tends to be worse when she is getting close to her next infusion day.  Her migraines have been under relatively good control with Topamax and rizatriptan.  She states that there is been a slight increase in the last 2 months but she does note that she has been under more stress.  She has 2-3 headaches a week.  She also reports that she has been having pain in the left arm.  This is been ongoing for the last 4 months.  He states that has ongoing symptoms in her left arm. She reports that it is sensitive to touch, also with sharp shooting pain.  It extends from the elbow down to the  fingers.  She reports that she was told to wear a brace and that has offered little benefit.  She is also on Arthrotec and gabapentin.  She reports that this is offered some benefit.  She returns today for evaluation.   REVIEW OF SYSTEMS: Out of a complete 14 system review of symptoms, the patient complains only of the following symptoms, and all other reviewed systems are negative.  ALLERGIES: No Known Allergies  HOME MEDICATIONS: Outpatient Medications Prior to Visit  Medication Sig Dispense Refill  . ARTHROTEC 75-0.2 MG TBEC Take 1 tablet by mouth daily as needed.   3  . Blood Glucose Monitoring Suppl (BLOOD GLUCOSE METER KIT AND SUPPLIES) Dispense based on patient and insurance preference. Use to monitor FSBS 1x daily fasting. Dx: 250.00 1 each 0  . Calcium-Magnesium-Zinc (CALCIUM-MAGNESUIUM-ZINC PO) Take by mouth.    . cholecalciferol (VITAMIN D) 1000 UNITS tablet Take 1,000 Units by mouth 2 (two) times daily.    . cyclobenzaprine (FLEXERIL) 10 MG tablet TAKE 1 Tablet BY MOUTH 4 TIMES DAILY AS NEEDED  3  . DiphenhydrAMINE HCl (BENADRYL ALLERGY PO) Take by mouth as needed.    . docusate sodium (COLACE) 50 MG capsule Take 1 capsule (50 mg total) by mouth 2 (two) times daily. 60 capsule 11  . gabapentin (NEURONTIN) 300 MG capsule Take 1 capsule (300 mg total) by mouth 3 (three) times daily. 90 capsule 11  . Garlic (GARLIQUE PO) Take 1 tablet by mouth daily.    . glucose blood (ACCU-CHEK   AVIVA PLUS) test strip Monitor FSBS 1x daily fasting. Dx: 250.00 100 each 3  . Insulin Glargine (BASAGLAR KWIKPEN) 100 UNIT/ML SOPN INJECT 12 UNITS UNDER THE SKIN EVERY NIGHT  3  . JANUMET 50-500 MG tablet Take 1 tablet by mouth daily.  3  . Lancet Devices (ACCU-CHEK SOFTCLIX) lancets Monitor FSBS 1x daily fasting. Dx: 250.00 100 each 3  . lisinopril-hydrochlorothiazide (PRINZIDE,ZESTORETIC) 10-12.5 MG per tablet TAKE 1 TABLET BY MOUTH DAILY. BLOOD PRESSURE 30 tablet 3  . meclizine (ANTIVERT) 25 MG tablet  Take 1 tablet (25 mg total) by mouth 3 (three) times daily as needed for dizziness. 30 tablet 6  . meloxicam (MOBIC) 15 MG tablet Take 1 tablet (15 mg total) by mouth daily. 90 tablet 1  . natalizumab (TYSABRI) 300 MG/15ML injection Inject 300 mg into the vein. Infuse 300mg IV every 28 days.    . norgestimate-ethinyl estradiol (ORTHO-CYCLEN,SPRINTEC,PREVIFEM) 0.25-35 MG-MCG tablet Take 1 tablet by mouth daily.    . rizatriptan (MAXALT) 10 MG tablet Take 1 tablet (10 mg total) by mouth as needed for migraine. May repeat in 2 hours if needed. Max dose: 2/24 hr or 15/30 days. 45 tablet 3  . topiramate (TOPAMAX) 100 MG tablet Take 1 tablet (100 mg total) by mouth 2 (two) times daily. 45 tablet 3  . traZODone (DESYREL) 50 MG tablet Take 1 tablet (50 mg total) by mouth at bedtime. 30 tablet 6  . venlafaxine XR (EFFEXOR-XR) 37.5 MG 24 hr capsule Take 2 capsules (75 mg total) by mouth daily with breakfast. 60 capsule 11   No facility-administered medications prior to visit.     PAST MEDICAL HISTORY: Past Medical History:  Diagnosis Date  . Anemia   . Carpal tunnel syndrome   . Diabetes mellitus   . Hypertension   . Multiple sclerosis (HCC)   . Vision, loss, sudden     PAST SURGICAL HISTORY: Past Surgical History:  Procedure Laterality Date  . BREAST REDUCTION SURGERY    . CYST EXCISION      FAMILY HISTORY: Family History  Problem Relation Age of Onset  . Cancer Mother   . Diabetes Mother   . Hearing loss Mother   . Thyroid nodules Mother   . Anemia Mother   . Kidney disease Mother   . Hypertension Mother   . Hyperlipidemia Father   . Anemia Sister   . Arthritis Maternal Grandmother   . Arthritis Maternal Grandfather   . Arthritis Paternal Grandmother   . Kidney disease Paternal Grandmother   . Hypertension Paternal Grandmother   . Arthritis Paternal Grandfather   . Vision loss Paternal Grandfather   . Stroke Paternal Grandfather   . Hyperlipidemia Paternal Grandfather   .  Anemia Sister     SOCIAL HISTORY: Social History   Socioeconomic History  . Marital status: Single    Spouse name: Not on file  . Number of children: 0  . Years of education: College  . Highest education level: Not on file  Occupational History  . Occupation: unemployed  Social Needs  . Financial resource strain: Not on file  . Food insecurity:    Worry: Not on file    Inability: Not on file  . Transportation needs:    Medical: Not on file    Non-medical: Not on file  Tobacco Use  . Smoking status: Never Smoker  . Smokeless tobacco: Never Used  Substance and Sexual Activity  . Alcohol use: No  . Drug use: No  . Sexual   activity: Not Currently    Birth control/protection: Pill  Lifestyle  . Physical activity:    Days per week: Not on file    Minutes per session: Not on file  . Stress: Not on file  Relationships  . Social connections:    Talks on phone: Not on file    Gets together: Not on file    Attends religious service: Not on file    Active member of club or organization: Not on file    Attends meetings of clubs or organizations: Not on file    Relationship status: Not on file  . Intimate partner violence:    Fear of current or ex partner: Not on file    Emotionally abused: Not on file    Physically abused: Not on file    Forced sexual activity: Not on file  Other Topics Concern  . Not on file  Social History Narrative   Live at home with parents.   Right handed.   Uses caffeine sparingly.      PHYSICAL EXAM  Vitals:   10/07/18 0745  BP: (!) 144/81  Pulse: 74  Weight: 294 lb 6.4 oz (133.5 kg)  Height: 5' 6" (1.676 m)   Body mass index is 47.52 kg/m.  Generalized: Well developed, in no acute distress   Neurological examination  Mentation: Alert oriented to time, place, history taking. Follows all commands speech and language fluent Cranial nerve II-XII: Pupils were equal round reactive to light. Extraocular movements were full, visual field  were full on confrontational test. Facial sensation and strength were normal. Uvula tongue midline. Head turning and shoulder shrug  were normal and symmetric. Motor: The motor testing reveals 5 over 5 strength in the upper extremities.4/5 in the lower extremities. Sensory: Sensory testing is intact to soft touch on all 4 extremities. No evidence of extinction is noted.  Coordination: Cerebellar testing reveals good finger-nose-finger and heel-to-shin bilaterally.  Gait and station: Patient has a slight limp when ambulating.  Tandem gait is unsteady.  Romberg is negative  reflexes: Deep tendon reflexes are symmetric and normal bilaterally.   DIAGNOSTIC DATA (LABS, IMAGING, TESTING) - I reviewed patient records, labs, notes, testing and imaging myself where available.  Lab Results  Component Value Date   WBC 6.7 02/26/2018   HGB 10.6 (L) 02/26/2018   HCT 33.3 (L) 02/26/2018   MCV 81 02/26/2018   PLT 288 02/26/2018      Component Value Date/Time   NA 136 02/26/2018 1631   K 4.3 02/26/2018 1631   CL 101 02/26/2018 1631   CO2 21 02/26/2018 1631   GLUCOSE 180 (H) 02/26/2018 1631   GLUCOSE 208 (H) 11/02/2014 0944   BUN 8 02/26/2018 1631   CREATININE 0.61 02/26/2018 1631   CREATININE 0.70 11/02/2014 0944   CALCIUM 9.0 02/26/2018 1631   PROT 6.8 02/26/2018 1631   ALBUMIN 3.9 02/26/2018 1631   AST 12 02/26/2018 1631   ALT 8 02/26/2018 1631   ALKPHOS 52 02/26/2018 1631   BILITOT 0.3 02/26/2018 1631   GFRNONAA 110 02/26/2018 1631   GFRAA 127 02/26/2018 1631   Lab Results  Component Value Date   CHOL 208 (H) 11/02/2014   HDL 66 11/02/2014   LDLCALC 119 (H) 11/02/2014   TRIG 114 11/02/2014   CHOLHDL 3.2 11/02/2014   Lab Results  Component Value Date   HGBA1C 7.7 (H) 06/17/2015   No results found for: [id number] Lab Results  Component Value Date   TSH 1.250 02/26/2018  ASSESSMENT AND PLAN 47 y.o. year old female  has a past medical history of Anemia, Carpal tunnel  syndrome, Diabetes mellitus, Hypertension, Multiple sclerosis ([place]), and Vision, loss, sudden. here with:  1.  Multiple sclerosis 2. Fatigue 3. Left arm paresthesia   The patient will continue on Tysabri.  I will check blood work today.  If her fatigue does not improve after infusion we may need to consider adding on provigil however fatigue could be multifactorial at this point.  She does report discomfort in the left arm.  Reports in the past she is been diagnosed with carpal Tunnel.  In the future we may need to consider nerve conduction studies.  She has not had any prior studies done in our office.  Her last MRI was stable.  Patient also reports that she has been trying to diet and exercise to improve her diabetes.  I will make referral to healthy weight and wellness for diet management.  She is advised that if her symptoms worsen or she develops new symptoms she should let us know.  She will follow-up in 6 months or sooner if needed.   Ward Givens, MSN, NP-C 10/07/2018, 8:09 AM [facility] [address] [place], [place] [address] [phone]

## 2018-10-07 NOTE — Patient Instructions (Addendum)
Your Plan:  Continue Tysabri  Continue Gabapentin Continue topamax, Efexxor and Trazodone If your symptoms worsen or you develop new symptoms please let us know.    Thank you for coming to see [place] at [facility]. I hope we have been able to provide you high quality care today.  You may receive a patient satisfaction survey over the next few weeks. We would appreciate your feedback and comments so that we may continue to improve ourselves and the health of our patients.

## 2018-10-08 LAB — COMPREHENSIVE METABOLIC PANEL
ALT: 17 IU/L (ref 0–32)
AST: 13 IU/L (ref 0–40)
Albumin/Globulin Ratio: 1.3 (ref 1.2–2.2)
Albumin: 3.8 g/dL (ref 3.5–5.5)
Alkaline Phosphatase: 62 IU/L (ref 39–117)
BUN/Creatinine Ratio: 13 (ref 9–23)
BUN: 8 mg/dL (ref 6–24)
Bilirubin Total: 0.3 mg/dL (ref 0.0–1.2)
CALCIUM: 8.6 mg/dL — AB (ref 8.7–10.2)
CO2: 20 mmol/L (ref 20–29)
CREATININE: 0.64 mg/dL (ref 0.57–1.00)
Chloride: 101 mmol/L (ref 96–106)
GFR, EST AFRICAN AMERICAN: 125 mL/min/{1.73_m2} (ref >59)
GFR, EST NON AFRICAN AMERICAN: 108 mL/min/{1.73_m2} (ref >59)
GLOBULIN, TOTAL: 3 g/dL (ref 1.5–4.5)
Glucose: 218 mg/dL — ABNORMAL HIGH (ref 65–99)
Potassium: 4.6 mmol/L (ref 3.5–5.2)
SODIUM: 138 mmol/L (ref 134–144)
TOTAL PROTEIN: 6.8 g/dL (ref 6.0–8.5)

## 2018-10-08 LAB — CBC WITH DIFFERENTIAL/PLATELET
BASOS: 1 %
Basophils Absolute: 0 10*3/uL (ref 0.0–0.2)
EOS (ABSOLUTE): 0.1 10*3/uL (ref 0.0–0.4)
EOS: 2 %
HEMATOCRIT: 38.7 % (ref 34.0–46.6)
Hemoglobin: 12.1 g/dL (ref 11.1–15.9)
IMMATURE GRANS (ABS): 0 10*3/uL (ref 0.0–0.1)
Immature Granulocytes: 0 %
LYMPHS: 42 %
Lymphocytes Absolute: 3 10*3/uL (ref 0.7–3.1)
MCH: 24.8 pg — ABNORMAL LOW (ref 26.6–33.0)
MCHC: 31.3 g/dL — ABNORMAL LOW (ref 31.5–35.7)
MCV: 80 fL (ref 79–97)
Monocytes Absolute: 0.7 10*3/uL (ref 0.1–0.9)
Monocytes: 9 %
NEUTROS PCT: 46 %
Neutrophils Absolute: 3.3 10*3/uL (ref 1.4–7.0)
Platelets: 294 10*3/uL (ref 150–450)
RBC: 4.87 x10E6/uL (ref 3.77–5.28)
RDW: 14.8 % (ref 12.3–15.4)
WBC: 7.2 10*3/uL (ref 3.4–10.8)

## 2018-10-14 ENCOUNTER — Telehealth: Payer: Self-pay | Admitting: *Deleted

## 2018-10-14 NOTE — Telephone Encounter (Signed)
Previous JCV was 1.01 now 1.30. Dr. Terrace Arabia would you like the patient to remain on Tysabri and keep monitoring labs?

## 2018-10-14 NOTE — Telephone Encounter (Signed)
She started [place] since March 2016 with higher Jc virus titer, 1.01/999 patient risk of PML,  May give her above info, may not be a bad idea to switch to [place]  Can schedule her next follow up with me earlier.

## 2018-10-14 NOTE — Telephone Encounter (Signed)
I called the patient.  Advised that her JCV titer has increased.  The patient will be placed back on Dr. Terrace Arabia schedule within the next 4  weeks to discuss changing treatment plans.

## 2018-10-14 NOTE — Telephone Encounter (Signed)
JCV positive index 1.30.

## 2018-10-14 NOTE — Progress Notes (Signed)
JCV POSITIVE 1.30 10-07-18 sy [facility].

## 2018-10-14 NOTE — Telephone Encounter (Signed)
Spoke to patient - she has been scheduled with Dr. Terrace Arabia on 10/21/18.

## 2018-10-21 ENCOUNTER — Encounter: Payer: Self-pay | Admitting: *Deleted

## 2018-10-21 ENCOUNTER — Other Ambulatory Visit: Payer: Self-pay | Admitting: *Deleted

## 2018-10-21 ENCOUNTER — Ambulatory Visit (INDEPENDENT_AMBULATORY_CARE_PROVIDER_SITE_OTHER): Payer: Medicare Other | Admitting: Neurology

## 2018-10-21 ENCOUNTER — Encounter: Payer: Self-pay | Admitting: Neurology

## 2018-10-21 VITALS — BP 155/99 | HR 91 | Ht 66.0 in | Wt 288.5 lb

## 2018-10-21 DIAGNOSIS — M79602 Pain in left arm: Secondary | ICD-10-CM

## 2018-10-21 DIAGNOSIS — Z79899 Other long term (current) drug therapy: Secondary | ICD-10-CM

## 2018-10-21 DIAGNOSIS — G35 Multiple sclerosis: Secondary | ICD-10-CM

## 2018-10-21 DIAGNOSIS — M25532 Pain in left wrist: Secondary | ICD-10-CM

## 2018-10-21 MED ORDER — VENLAFAXINE HCL ER 75 MG PO CP24: 150.0000 mg | ORAL_CAPSULE | Freq: Every day | ORAL | 11 refills | Status: DC

## 2018-10-21 NOTE — Progress Notes (Unsigned)
X ray

## 2018-10-21 NOTE — Progress Notes (Signed)
PATIENT: Katrina Montes DOB: 1971-06-12   REASON FOR VISIT: follow up HISTORY FROM: patient  HISTORY OF PRESENT ILLNESS:  HISTORY Katrina Montes is 47 right-handed African-American female, accompanied by her mother, referred by her primary care physician Dr. Buelah Manis for evaluation of right optic neuritis, abnormal MRI of the brain, suspicious for relapsing remitting multiple sclerosis, initial evaluation was on Jan 14 2015.  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  Relapsing Remitting Multiple Sclerosis was diagnosed since January 2016: Based on her abnormal MRI of the brain, there was also cervical spinal cord involvement  MRI of the brain without contrast at [facility] imaging January 2016 showed multiple periventricular white matter disease, significant involvement of corpus callosum, perpendicular to ventricle, consistent with multiple sclerosis, now was also suspicious for right optic nerve edema, with hyperintensity signal at T2, FLAIR, no contrast enhancement, MRA of brain was normal.  MRI of the cervical spine with and without contrast in Jan 2016: showing hyperintense foci within the spinal cord posteriorly at C4 and posteriolaterally at C7-T1 consistent with multiple sclerosis plaques. There were no enhancing foci.  Spinal fluid testing January 13 2015, total protein was 708, RBC was 1600, RBC was 7,, cloudy reddish-looking, elevated IgG 17.7, IgG index 0.97, IgG synthetic rate 238, 0 oligoclonal  bands,  Visual evoked potential showed prolongation on the right side.  She was treated with IV Solu-Medrol for 3 days in Jan 2016, which only mild improved her right vision, has significant worsening of her blood glucose level.She was treated with Achtar injection in Feb 2016, but still having significant residual visual difficulty.   JC virus was positive with titer of 1.1 3 (Jan 14 2015),  laboratory evaluation showed normal or negative CPK, HIV, protein electrophoresis, positive varicella-zoster virus antibody, mild elevated ESR, C-reactive protein, hepatitis panel, Lyme titer, showed anemia, hemoglobin 11.8, otherwise normal CBC, normal CMP, with exception of elevated glucose, normal TSH,  She was treated with Tysarbril since March 14th, 2016,   Most recent repeat MRI of the brain with and without contrast in April 2019 showed multiple supratentorium lesions, no contrast enhancement, no significant change compared to previous MRI of the brain  She continues to have significant limitations  Vision: History of right optic neuritis, with partial recovery, now complains of blurry vision, difficult to read anything without bleeders or magnified glasses, difficult to read through computer screen or watch TV, visual acuity, OS 20/40, OD 20/50-1  Cognitive impairment: has to take frequent notes, difficulty to concentrate, to express herself.  Gait abnormality: Treated easily, generalized weakness, also complicated by her low back pain, left sided radicular pain,  Spasticity: Bilateral lower extremity spasticity, is taking gabapentin 300 mg 3 times a day previously tried and failed baclofen  Fatigue: Severe fatigue, also complicated by her depression anxiety chronic insomnia, body achy pain, limit her daily activity more than 50%, she has heavy menstruation-like cycle, chronic anemia, recent hemoglobin was 10.6.   Chronic migraine: She also has history of chronic migraine  headaches,  retro-orbital area severe pounding headache with associated light noise sensitivity, and happened frequently, up to 3 to four times each week, Maxalt as needed was helpful,  Severe low back pain, radiating pain to left lower extremity,  MRI of lumbar in July 201 there is minimum anterolisthesis of L4 upon L5, due to severe facet hypertrophy, associated with left ligamentum flavum hypertrophy and disc protrusion, there is moderate  Left foraminal and lateral recess stenosis, potential for left L4-5 nerve root compression.  Diabetic peripheral neuropathy, Progressive worsening bilateral feet paresthesia, now taking insulin  Depression anxiety, chronic insomnia, polypharmacy treatment:  Trazodone 50 mg every night, gabapentin 300 mg 3 times a day, Topamax 100 mg twice a day, Flexeril as needed  UPDATE Oct 21 2018: She is accompanied by her mother at today's clinical visit, constellation of complaints, generalized fatigue, left wrist pain, not feeling well, there is no MS flareup,  Recent JC virus titer was 1.3 in October 2019, she has been on [place] since March 2016, she would have 1 out of 1000 chance of having PML infection,  After discussed with patient, with her constellation of complaints, significant lesion noted on MRI of brain, will proceed with ocrelizumab,  She also complains of new onset left wrist pain, tender to touch  REVIEW OF SYSTEMS: Out of a complete 14 system review of symptoms, the patient complains only of the following symptoms, with headache change, excessive sweating, ringing in ears, trouble swallowing, light sensitivity, loss of vision, blurred vision, shortness of breath, choking, cold, heat intolerance, excessive thirst, excessive eating, constipation, diarrhea, nausea, incontinence of bowel, restless leg, insomnia, urgency, joint pain, swelling, low back pain, aching muscles, cramps, walking difficulty, neck pain, stiffness, anemia, memory loss, dizziness, headache,  numbness, speech difficulty, weakness, agitation, confusion, decreased concentration, depression anxiety   and all other reviewed systems are negative.  ALLERGIES: No Known Allergies  HOME MEDICATIONS: Outpatient Medications Prior to Visit  Medication Sig Dispense Refill  . ARTHROTEC 75-0.2 MG TBEC Take 1 tablet by mouth daily as needed.   3  . Blood Glucose Monitoring Suppl (BLOOD GLUCOSE METER KIT AND SUPPLIES) Dispense based on patient and insurance preference. Use to monitor FSBS 1x daily fasting. Dx: 250.00 1 each 0  . Calcium-Magnesium-Zinc (CALCIUM-MAGNESUIUM-ZINC PO) Take by mouth.    . cholecalciferol (VITAMIN D) 1000 UNITS tablet Take 1,000 Units by mouth 2 (two) times daily.    . cyclobenzaprine (FLEXERIL) 10 MG tablet TAKE 1 Tablet BY MOUTH 4 TIMES DAILY AS NEEDED  3  . DiphenhydrAMINE HCl (BENADRYL ALLERGY PO) Take by mouth as needed.    . docusate sodium (COLACE) 50 MG capsule Take 1 capsule (50 mg total) by mouth 2 (two) times daily. 60 capsule 11  . gabapentin (NEURONTIN) 300 MG capsule Take 1 capsule (300 mg total) by mouth 3 (three) times daily. 90 capsule 11  . Garlic (GARLIQUE PO) Take 1 tablet by mouth daily.    Marland Kitchen glucose blood (ACCU-CHEK AVIVA PLUS) test strip Monitor FSBS 1x daily fasting. Dx: 250.00 100 each 3  . Insulin Glargine (BASAGLAR KWIKPEN) 100 UNIT/ML SOPN INJECT 12 UNITS UNDER THE SKIN EVERY NIGHT  3  . JANUMET 50-500 MG tablet Take 1 tablet by mouth daily.  3  . Lancet Devices (ACCU-CHEK SOFTCLIX) lancets Monitor FSBS 1x daily fasting. Dx: 250.00 100 each 3  . lisinopril-hydrochlorothiazide (PRINZIDE,ZESTORETIC) 20-12.5 MG tablet Take 1 tablet by mouth daily.    . meclizine (ANTIVERT) 25 MG tablet Take 1 tablet (25  mg total) by mouth 3 (three) times daily as needed for dizziness. 30 tablet 6  . meloxicam (MOBIC) 15 MG tablet Take 1 tablet (15 mg total) by mouth daily. 90 tablet 1  . natalizumab (TYSABRI) 300 MG/15ML injection Inject 300 mg into the vein.  Infuse 323m IV every 28 days.    . norgestimate-ethinyl estradiol (ORTHO-CYCLEN,SPRINTEC,PREVIFEM) 0.25-35 MG-MCG tablet Take 1 tablet by mouth daily.    . rizatriptan (MAXALT) 10 MG tablet Take 1 tablet (10 mg total) by mouth as needed for migraine. May repeat in 2 hours if needed. Max dose: 2/24 hr or 15/30 days. 45 tablet 3  . topiramate (TOPAMAX) 100 MG tablet Take 1 tablet (100 mg total) by mouth 2 (two) times daily. 180 tablet 3  . traZODone (DESYREL) 50 MG tablet Take 1 tablet (50 mg total) by mouth at bedtime. 30 tablet 6  . venlafaxine XR (EFFEXOR-XR) 37.5 MG 24 hr capsule Take 2 capsules (75 mg total) by mouth daily with breakfast. 180 capsule 3  . lisinopril-hydrochlorothiazide (PRINZIDE,ZESTORETIC) 10-12.5 MG per tablet TAKE 1 TABLET BY MOUTH DAILY. BLOOD PRESSURE 30 tablet 3   No facility-administered medications prior to visit.     PAST MEDICAL HISTORY: Past Medical History:  Diagnosis Date  . Anemia   . Carpal tunnel syndrome   . Diabetes mellitus   . Hypertension   . Multiple sclerosis ([place]   . Vision, loss, sudden     PAST SURGICAL HISTORY: Past Surgical History:  Procedure Laterality Date  . BREAST REDUCTION SURGERY    . CYST EXCISION      FAMILY HISTORY: Family History  Problem Relation Age of Onset  . Cancer Mother   . Diabetes Mother   . Hearing loss Mother   . Thyroid nodules Mother   . Anemia Mother   . Kidney disease Mother   . Hypertension Mother   . Hyperlipidemia Father   . Anemia Sister   . Arthritis Maternal Grandmother   . Arthritis Maternal Grandfather   . Arthritis Paternal Grandmother   . Kidney disease Paternal Grandmother   . Hypertension Paternal Grandmother   . Arthritis Paternal Grandfather   . Vision loss Paternal Grandfather   . Stroke Paternal Grandfather   . Hyperlipidemia Paternal Grandfather   . Anemia Sister     SOCIAL HISTORY: Social History   Socioeconomic History  . Marital status: Single    Spouse name: Not  on file  . Number of children: 0  . Years of education: College  . Highest education level: Not on file  Occupational History  . Occupation: unemployed  Social Needs  . Financial resource strain: Not on file  . Food insecurity:    Worry: Not on file    Inability: Not on file  . Transportation needs:    Medical: Not on file    Non-medical: Not on file  Tobacco Use  . Smoking status: Never Smoker  . Smokeless tobacco: Never Used  Substance and Sexual Activity  . Alcohol use: No  . Drug use: No  . Sexual activity: Not Currently    Birth control/protection: Pill  Lifestyle  . Physical activity:    Days per week: Not on file    Minutes per session: Not on file  . Stress: Not on file  Relationships  . Social connections:    Talks on phone: Not on file    Gets together: Not on file    Attends religious service: Not on file    Active  member of club or organization: Not on file    Attends meetings of clubs or organizations: Not on file    Relationship status: Not on file  . Intimate partner violence:    Fear of current or ex partner: Not on file    Emotionally abused: Not on file    Physically abused: Not on file    Forced sexual activity: Not on file  Other Topics Concern  . Not on file  Social History Narrative   Live at home with parents.   Right handed.   Uses caffeine sparingly.      PHYSICAL EXAM  Vitals:   10/21/18 1050  BP: (!) 155/99  Pulse: 91  Weight: 288 lb 8 oz (130.9 kg)  Height: _0  (1.676 m)   Body mass index is 46.57 kg/m.  Generalized: Well developed, in no acute distress   Neurological examination  Mentation: Alert oriented to time, place, history taking. Follows all commands speech and language fluent Cranial nerve II-XII: Pupils were equal round reactive to light. Extraocular movements were full, visual field were full on confrontational test. Facial sensation and strength were normal. Uvula tongue midline. Head turning and shoulder  shrug  were normal and symmetric. Motor: The motor testing reveals 5 over 5 strength in the upper extremities.4/5 in the lower extremities.  Left wrist tenderness upon deep palpitation, mild swelling Sensory: Sensory testing is intact to soft touch on all 4 extremities. No evidence of extinction is noted.  Coordination: Cerebellar testing reveals good finger-nose-finger and heel-to-shin bilaterally.  Gait and station: Patient has a slight limp when ambulating.  Tandem gait is unsteady.  Romberg is negative  reflexes: Deep tendon reflexes are symmetric and normal bilaterally.   DIAGNOSTIC DATA (LABS, IMAGING, TESTING) - I reviewed patient records, labs, notes, testing and imaging myself where available.  Lab Results  Component Value Date   WBC 7.2 10/07/2018   HGB 12.1 10/07/2018   HCT 38.7 10/07/2018   MCV 80 10/07/2018   PLT 294 10/07/2018      Component Value Date/Time   NA 138 10/07/2018 0836   K 4.6 10/07/2018 0836   CL 101 10/07/2018 0836   CO2 20 10/07/2018 0836   GLUCOSE 218 (H) 10/07/2018 0836   GLUCOSE 208 (H) 11/02/2014 0944   BUN 8 10/07/2018 0836   CREATININE 0.64 10/07/2018 0836   CREATININE 0.70 11/02/2014 0944   CALCIUM 8.6 (L) 10/07/2018 0836   PROT 6.8 10/07/2018 0836   ALBUMIN 3.8 10/07/2018 0836   AST 13 10/07/2018 0836   ALT 17 10/07/2018 0836   ALKPHOS 62 10/07/2018 0836   BILITOT 0.3 10/07/2018 0836   GFRNONAA 108 10/07/2018 0836   GFRAA 125 10/07/2018 0836   Lab Results  Component Value Date   CHOL 208 (H) 11/02/2014   HDL 66 11/02/2014   LDLCALC 119 (H) 11/02/2014   TRIG 114 11/02/2014   CHOLHDL 3.2 11/02/2014   Lab Results  Component Value Date   HGBA1C 7.7 (H) 06/17/2015   No results found for: VITAMINB12 Lab Results  Component Value Date   TSH 1.250 02/26/2018      ASSESSMENT AND PLAN 47 y.o. year old female   Relapsing remitting multiple sclerosis  Has been on Tysabri since March 2016, with elevated JC virus titer 1.3,  After  discussed with patient and her mother, we will switch to ocrelizumab  Laboratory evaluation  Continue Tysarbri infusion, will stop 4 to 5 weeks prior to ocrelizumab,  New onset left arm  pain  X-ray of left arm   Heating pad  NSAIDs   Marcial Pacas, M.D. Ph.D.  [facility] [place], [place] [address] Phone: [phone] Fax:      [phone]

## 2018-10-22 ENCOUNTER — Ambulatory Visit
Admission: RE | Admit: 2018-10-22 | Discharge: 2018-10-22 | Disposition: A | Discharge status: Home / Self Care | Payer: Medicare Other | Source: Ambulatory Visit | Attending: Neurology | Admitting: Neurology

## 2018-10-22 DIAGNOSIS — M25532 Pain in left wrist: Secondary | ICD-10-CM | POA: Diagnosis not present

## 2018-10-23 ENCOUNTER — Telehealth: Payer: Self-pay | Admitting: Neurology

## 2018-10-23 DIAGNOSIS — M25532 Pain in left wrist: Secondary | ICD-10-CM

## 2018-10-23 NOTE — Telephone Encounter (Addendum)
Spoke to patient - she is aware of her wrist x-ray results.  States she has already been using heat and NSAIDS.  Her pain has been present for several months.  Per vo by Dr. Terrace Arabia, ok to place a referral for orthopaedics for further evaluation.  The patient was very appreciative.

## 2018-10-23 NOTE — Telephone Encounter (Signed)
Please call patient, x-ray of her left wrist soft tissue calcification along the left radial margin, she should continue NSAIDs as needed and hot compression   MPRESSION: No acute fracture or malalignment. No significant arthropathy. Nonspecific tiny linear soft tissue calcification along the radial margin of the distal epiphysis of the left radius, which could represent a tiny chronic avulsion fragment or developmental ossicle.

## 2018-10-23 NOTE — Addendum Note (Signed)
Addended by: Lindell Spar C on: 10/23/2018 03:36 PM   Modules accepted: Orders

## 2018-10-24 ENCOUNTER — Telehealth: Payer: Self-pay | Admitting: Neurology

## 2018-10-24 LAB — COMPREHENSIVE METABOLIC PANEL
ALBUMIN: 4 g/dL (ref 3.5–5.5)
ALK PHOS: 75 IU/L (ref 39–117)
ALT: 17 IU/L (ref 0–32)
AST: 15 IU/L (ref 0–40)
Albumin/Globulin Ratio: 1.3 (ref 1.2–2.2)
BUN / CREAT RATIO: 14 (ref 9–23)
BUN: 10 mg/dL (ref 6–24)
Bilirubin Total: 0.2 mg/dL (ref 0.0–1.2)
CO2: 23 mmol/L (ref 20–29)
CREATININE: 0.73 mg/dL (ref 0.57–1.00)
Calcium: 9.3 mg/dL (ref 8.7–10.2)
Chloride: 104 mmol/L (ref 96–106)
GFR calc Af Amer: 114 mL/min/{1.73_m2} (ref >59)
GFR calc non Af Amer: 99 mL/min/{1.73_m2} (ref >59)
GLOBULIN, TOTAL: 3.2 g/dL (ref 1.5–4.5)
Glucose: 237 mg/dL — ABNORMAL HIGH (ref 65–99)
Potassium: 4.7 mmol/L (ref 3.5–5.2)
SODIUM: 137 mmol/L (ref 134–144)
Total Protein: 7.2 g/dL (ref 6.0–8.5)

## 2018-10-24 LAB — CBC WITH DIFFERENTIAL/PLATELET
Basophils Absolute: 0 10*3/uL (ref 0.0–0.2)
Basos: 0 %
EOS (ABSOLUTE): 0.1 10*3/uL (ref 0.0–0.4)
Eos: 1 %
HEMATOCRIT: 34.5 % (ref 34.0–46.6)
HEMOGLOBIN: 11.4 g/dL (ref 11.1–15.9)
IMMATURE GRANULOCYTES: 0 %
Immature Grans (Abs): 0 10*3/uL (ref 0.0–0.1)
LYMPHS ABS: 3.6 10*3/uL — AB (ref 0.7–3.1)
Lymphs: 46 %
MCH: 25.6 pg — AB (ref 26.6–33.0)
MCHC: 33 g/dL (ref 31.5–35.7)
MCV: 77 fL — AB (ref 79–97)
MONOS ABS: 0.7 10*3/uL (ref 0.1–0.9)
Monocytes: 8 %
NEUTROS ABS: 3.6 10*3/uL (ref 1.4–7.0)
NRBC: 1 % — ABNORMAL HIGH (ref 0–0)
Neutrophils: 45 %
Platelets: 332 10*3/uL (ref 150–450)
RBC: 4.46 x10E6/uL (ref 3.77–5.28)
RDW: 14.6 % (ref 12.3–15.4)
WBC: 7.9 10*3/uL (ref 3.4–10.8)

## 2018-10-24 LAB — QUANTIFERON-TB GOLD PLUS
QUANTIFERON NIL VALUE: 0.03 [IU]/mL
QUANTIFERON TB1 AG VALUE: 0.05 [IU]/mL
QuantiFERON TB2 Ag Value: 0.07 IU/mL
QuantiFERON-TB Gold Plus: NEGATIVE

## 2018-10-24 LAB — HEPATITIS B SURFACE ANTIBODY,QUALITATIVE: HEP B SURFACE AB, QUAL: NONREACTIVE

## 2018-10-24 LAB — VARICELLA ZOSTER ANTIBODY, IGG: Varicella zoster IgG: 1399 index (ref >165)

## 2018-10-24 LAB — HEPATITIS B SURFACE ANTIGEN: Hepatitis B Surface Ag: NEGATIVE

## 2018-10-24 LAB — TSH: TSH: 1.05 u[IU]/mL (ref 0.450–4.500)

## 2018-10-24 LAB — HEPATITIS B CORE ANTIBODY, TOTAL: Hep B Core Total Ab: NEGATIVE

## 2018-10-24 NOTE — Telephone Encounter (Signed)
Spoke to patient - she is aware of her lab results.  States she had eaten prior to her blood work.  Says she keeps a close watch on her glucose.

## 2018-10-24 NOTE — Telephone Encounter (Signed)
Please call patient, extensive laboratory evaluation showed elevated glucose 237, otherwise no significant abnormality.

## 2018-10-29 ENCOUNTER — Ambulatory Visit (INDEPENDENT_AMBULATORY_CARE_PROVIDER_SITE_OTHER): Payer: Medicare Other | Admitting: Orthopaedic Surgery

## 2018-11-06 ENCOUNTER — Encounter (INDEPENDENT_AMBULATORY_CARE_PROVIDER_SITE_OTHER): Payer: Self-pay | Admitting: Orthopaedic Surgery

## 2018-11-06 ENCOUNTER — Ambulatory Visit (INDEPENDENT_AMBULATORY_CARE_PROVIDER_SITE_OTHER): Payer: Medicare Other | Admitting: Orthopaedic Surgery

## 2018-11-06 DIAGNOSIS — M654 Radial styloid tenosynovitis [de Quervain]: Secondary | ICD-10-CM | POA: Diagnosis not present

## 2018-11-06 MED ORDER — LIDOCAINE HCL 1 % IJ SOLN
0.3000 mL | INTRAMUSCULAR | Status: AC | PRN
  Administered 2018-11-06: .3 mL

## 2018-11-06 MED ORDER — METHYLPREDNISOLONE ACETATE 40 MG/ML IJ SUSP
13.3300 mg | INTRAMUSCULAR | Status: AC | PRN
  Administered 2018-11-06: 13.33 mg

## 2018-11-06 MED ORDER — BUPIVACAINE HCL 0.25 % IJ SOLN
0.3300 mL | INTRAMUSCULAR | Status: AC | PRN
  Administered 2018-11-06: .33 mL

## 2018-11-06 NOTE — Progress Notes (Signed)
Office Visit Note   Patient: Katrina Montes           Date of Birth: Dec 23, 1970           MRN: [id number] Visit Date: 11/06/2018              Requested by: Salley Scarlet, MD [address] [place], [place] [address] PCP: Salley Scarlet, MD   Assessment & Plan: Visit Diagnoses:  1. De Quervain's disease (radial styloid tenosynovitis)     Plan: Impression is left de Quervain's Tina synovitis.  Today, we injected this with cortisone.  She will continue to wear her wrist splint for comfort.  Follow-up with [place] as needed.  Follow-Up Instructions: Return if symptoms worsen or fail to improve.   Orders:  Orders Placed This Encounter  Procedures  . Hand/UE Inj: L extensor compartment 1   No orders of the defined types were placed in this encounter.     Procedures: Hand/UE Inj: L extensor compartment 1 for de Quervain's tenosynovitis on 11/06/2018 5:09 PM Medications: 0.3 mL lidocaine 1 %; 0.33 mL bupivacaine 0.25 %; 13.33 mg methylPREDNISolone acetate 40 MG/ML      Clinical Data: No additional findings.   Subjective: Chief Complaint  Patient presents with  . Left Wrist - Pain    HPI patient is a pleasant 47 year old female presents to our clinic today with left radial sided wrist pain.  This has been ongoing for the past several months without any known injury or change in activity.  The pain she has is primarily to the first dorsal compartment.  She describes mostly hypersensitivity but increased pain with lifting or using her wrist.  She has tried Arthrotec which she takes for her MS which seems to minimally help.  She also has used a wrist splint without relief of symptoms.  No numbness, tingling or burning.  Review of Systems as detailed in HPI.  All others reviewed and are negative.   Objective: Vital Signs: There were no vitals taken for this visit.  Physical Exam well-developed well-nourished female no acute distress.  Alert and oriented x3.  Ortho  Exam examination of her left wrist reveals marked tenderness the first dorsal compartment.  Positive Finkelstein.  She does have increased tenderness throughout the entire forearm.  Specialty Comments:  No specialty comments available.  Imaging: X-rays in canopy reviewed by me and are negative for acute findings   PMFS History: Patient Active Problem List   Diagnosis Date Noted  . De Quervain's disease (radial styloid tenosynovitis) 11/06/2018  . Left arm pain 10/21/2018  . High risk medication use 10/21/2018  . Gait abnormality 04/04/2018  . Other fatigue 04/04/2018  . Depression 01/29/2017  . Left hip pain 02/22/2016  . Migraine without aura and without status migrainosus, not intractable 03/30/2015  . Left lumbar radiculopathy 03/09/2015  . Right optic neuritis 01/14/2015  . Multiple sclerosis (HCC) 01/14/2015  . CSF abnormal 01/14/2015  . Bursitis of left hip 11/02/2014  . Shoulder pain 11/02/2014  . Gastroenteritis 09/01/2014  . Trichomonal vaginitis 08/06/2014  . Screening for malignant neoplasm of the cervix 08/04/2014  . Routine general medical examination at a health care facility 07/28/2014  . OVARIAN CYST 02/09/2011  . IRREGULAR MENSES 11/24/2010  . OBESITY 10/24/2010  . Diabetes mellitus type II, uncontrolled (HCC) 09/22/2010  . VITAMIN D DEFICIENCY 09/22/2010  . ANEMIA-IRON DEFICIENCY 09/22/2010  . Essential hypertension 09/22/2010   Past Medical History:  Diagnosis Date  . Anemia   .  Carpal tunnel syndrome   . Diabetes mellitus   . Hypertension   . Multiple sclerosis (HCC)   . Vision, loss, sudden     Family History  Problem Relation Age of Onset  . Cancer Mother   . Diabetes Mother   . Hearing loss Mother   . Thyroid nodules Mother   . Anemia Mother   . Kidney disease Mother   . Hypertension Mother   . Hyperlipidemia Father   . Anemia Sister   . Arthritis Maternal Grandmother   . Arthritis Maternal Grandfather   . Arthritis Paternal  Grandmother   . Kidney disease Paternal Grandmother   . Hypertension Paternal Grandmother   . Arthritis Paternal Grandfather   . Vision loss Paternal Grandfather   . Stroke Paternal Grandfather   . Hyperlipidemia Paternal Grandfather   . Anemia Sister     Past Surgical History:  Procedure Laterality Date  . BREAST REDUCTION SURGERY    . CYST EXCISION     Social History   Occupational History  . Occupation: unemployed  Tobacco Use  . Smoking status: Never Smoker  . Smokeless tobacco: Never Used  Substance and Sexual Activity  . Alcohol use: No  . Drug use: No  . Sexual activity: Not Currently    Birth control/protection: Pill

## 2018-12-19 ENCOUNTER — Telehealth: Payer: Self-pay | Admitting: Neurology

## 2018-12-19 NOTE — Telephone Encounter (Signed)
Her last dose of Tysabri was in November 2019.  [facility] has scheduled her first Ocrevus infusion on 12/26/2018 at 8am.  She is aware of the appt time and location.

## 2018-12-19 NOTE — Telephone Encounter (Signed)
Patient called and requested to speak with Marcelino Duster RN regarding treatment she is supposed to be receiving. She did not go into detail. Please call and advise.

## 2018-12-24 ENCOUNTER — Telehealth: Payer: Self-pay | Admitting: Neurology

## 2018-12-24 NOTE — Telephone Encounter (Signed)
pt has called for the intrafusion suite, call transferred °

## 2018-12-25 ENCOUNTER — Telehealth: Payer: Self-pay | Admitting: Neurology

## 2018-12-25 NOTE — Telephone Encounter (Signed)
pt has called for the intrafusion suite, call transferred °

## 2019-01-02 ENCOUNTER — Encounter (INDEPENDENT_AMBULATORY_CARE_PROVIDER_SITE_OTHER): Payer: Medicare Other

## 2019-01-07 ENCOUNTER — Encounter (INDEPENDENT_AMBULATORY_CARE_PROVIDER_SITE_OTHER): Payer: Self-pay

## 2019-01-07 ENCOUNTER — Ambulatory Visit (INDEPENDENT_AMBULATORY_CARE_PROVIDER_SITE_OTHER): Payer: Medicare Other | Admitting: Family Medicine

## 2019-01-07 DIAGNOSIS — G35 Multiple sclerosis: Secondary | ICD-10-CM | POA: Diagnosis not present

## 2019-01-14 ENCOUNTER — Encounter (INDEPENDENT_AMBULATORY_CARE_PROVIDER_SITE_OTHER): Payer: Self-pay | Admitting: Bariatrics

## 2019-01-14 ENCOUNTER — Ambulatory Visit (INDEPENDENT_AMBULATORY_CARE_PROVIDER_SITE_OTHER): Payer: Medicare Other | Admitting: Bariatrics

## 2019-01-14 VITALS — BP 127/83 | HR 92 | Temp 97.8°F | Ht 67.0 in | Wt 276.0 lb

## 2019-01-14 DIAGNOSIS — I10 Essential (primary) hypertension: Secondary | ICD-10-CM | POA: Diagnosis not present

## 2019-01-14 DIAGNOSIS — Z6841 Body Mass Index (BMI) 40.0 and over, adult: Secondary | ICD-10-CM

## 2019-01-14 DIAGNOSIS — F3289 Other specified depressive episodes: Secondary | ICD-10-CM

## 2019-01-14 DIAGNOSIS — E119 Type 2 diabetes mellitus without complications: Secondary | ICD-10-CM | POA: Diagnosis not present

## 2019-01-14 DIAGNOSIS — R5383 Other fatigue: Secondary | ICD-10-CM

## 2019-01-14 DIAGNOSIS — E559 Vitamin D deficiency, unspecified: Secondary | ICD-10-CM

## 2019-01-14 DIAGNOSIS — G43809 Other migraine, not intractable, without status migrainosus: Secondary | ICD-10-CM | POA: Diagnosis not present

## 2019-01-14 DIAGNOSIS — D508 Other iron deficiency anemias: Secondary | ICD-10-CM

## 2019-01-14 DIAGNOSIS — R0602 Shortness of breath: Secondary | ICD-10-CM

## 2019-01-14 DIAGNOSIS — Z0289 Encounter for other administrative examinations: Secondary | ICD-10-CM

## 2019-01-15 ENCOUNTER — Other Ambulatory Visit (INDEPENDENT_AMBULATORY_CARE_PROVIDER_SITE_OTHER): Payer: Self-pay

## 2019-01-15 DIAGNOSIS — E119 Type 2 diabetes mellitus without complications: Secondary | ICD-10-CM

## 2019-01-15 LAB — VITAMIN D 25 HYDROXY (VIT D DEFICIENCY, FRACTURES): VIT D 25 HYDROXY: 13.9 ng/mL — AB (ref 30.0–100.0)

## 2019-01-15 MED ORDER — GLUCOSE BLOOD VI STRP: ORAL_STRIP | 0 refills | Status: DC

## 2019-01-15 MED ORDER — ACCU-CHEK SAFE-T PRO LANCETS MISC: 0 refills | Status: DC

## 2019-01-15 MED ORDER — BLOOD GLUCOSE MONITOR KIT: PACK | 0 refills | Status: DC

## 2019-01-15 NOTE — Progress Notes (Signed)
.  Office: [phone]  /  Fax: [phone]   HPI:   Chief Complaint: OBESITY  Katrina Montes (MR# [id number]) is a 48 y.o. female who presents on 01/14/2019 for obesity evaluation and treatment. Current BMI is Body mass index is 43.23 kg/m.Marland Kitchen Katrina Montes has struggled with obesity for years and has been unsuccessful in either losing weight or maintaining long term weight loss. Katrina Montes attended our information session and states she is currently in the action stage of change and ready to dedicate time achieving and maintaining a healthier weight.  Katrina Montes states her family eats meals together her desired weight loss is 76 lbs she has been heavy most of  her life her heaviest weight ever was 320 lbs. she sometimes considers herself a "picky eater"  she craves sweets she snacks frequently in the evenings she eats outside the home about 4 to 5 times per week she skips meals frequently she is frequently drinking liquids with calories she frequently makes poor food choices she has problems with excessive hunger  she struggles with emotional eating    Katrina Montes Katrina Montes feels her energy is lower than it should be. This has worsened with weight gain and has not worsened recently. Katrina Montes admits to daytime somnolence and she admits to waking up still tired. Patient is at risk for obstructive sleep apnea. Patent has a history of symptoms of daytime Katrina Montes, morning Katrina Montes, morning headache and Katrina Montes. Patient generally gets varied amounts of sleep per night, and states they generally have restless sleep. Snoring is present. Apneic episodes are not present. Epworth Sleepiness Score is 19  Katrina Montes Katrina Montes notes increasing shortness of breath with exercising and seems to be worsening over time with weight gain. She notes getting out of breath sooner with activity than she used to. This has not gotten worse recently. Katrina Montes has a history of abnormal gait and a history of MS. Katrina Montes admits  orthopnea.  Katrina Montes Katrina Montes is a 48 y.o. female with Katrina Montes. She is taking Lisinopril. Katrina Montes denies lightheadedness. She is working weight loss to help control her blood pressure with the goal of decreasing her risk of heart attack and stroke. Katrina Montes blood pressure is currently controlled.  Katrina Montes II Katrina Montes has a diagnosis of Katrina Montes type II. She is taking Janumet and Basaglar 20 units at night. Katrina Montes is not checking her blood sugar. She has been working on intensive lifestyle modifications including diet, exercise, and weight loss to help control her blood glucose levels.  Katrina Montes deficiency Katrina Montes has a diagnosis of Katrina Montes deficiency. She is currently taking OTC vit Montes and denies nausea, vomiting or muscle weakness.  Katrina Montes Katrina Montes has had Katrina Montes for 2 weeks. She takes Topamax, Maxalt and occasional Ibuprofen. She has a history of abnormal CSF.  Katrina Montes Katrina Montes has a diagnosis of Montes. She had CBC 10/21/18 She has heavy long menstrual periods. She is currently stable.  Katrina Montes with emotional eating behaviors Katrina Montes is struggling with emotional eating and using food for comfort to the extent that it is negatively impacting her health. She often snacks when she is not hungry. Katrina Montes sometimes feels she is out of control and then feels guilty that she made poor food choices. She is attempting to work on behavior modification techniques to help reduce her emotional eating. She shows no sign of suicidal or homicidal ideations. PHQ-9 score is 19  Katrina Montes Screen Katrina Food and Mood (modified PHQ-9) score was  Katrina Montes screen Katrina Montes [facility] 2/9 01/14/2019  Decreased Interest  1  Down, Depressed, Hopeless 1  PHQ - 2 Score 2  Altered sleeping 3  Tired, decreased energy 3  Change in appetite 3  Feeling bad or failure about yourself  1  Trouble concentrating 1  Moving slowly or fidgety/restless 1  Suicidal thoughts 0  PHQ-9 Score 14    Difficult doing work/chores Somewhat difficult    ASSESSMENT AND PLAN:  Other Katrina Montes - Plan: EKG 12-Lead, Ambulatory referral to Sleep Studies  Shortness of breath on Montes  Essential Katrina Montes  Type 2 Katrina Montes mellitus without complication, without long-term current use of insulin (HCC)  Katrina Montes deficiency - Plan: Katrina Montes 25 Hydroxy (Vit-Montes Deficiency, Fractures), CANCELED: Katrina Montes 25 Hydroxy (Vit-Montes Deficiency, Fractures)  Other migraine without status migrainosus, not intractable  Other Katrina Montes  Other Katrina Montes - with emotional eating  Class 3 severe obesity with serious comorbidity and body mass index (BMI) of 40.0 to 44.9 in adult, unspecified obesity type (HCC)  Imbalanced nutrition  PLAN:  Katrina Montes Katrina Montes was informed that her Katrina Montes may be related to obesity, Katrina Montes or many other causes. Labs will be ordered, and in the meanwhile Katrina Montes has agreed to work on diet, exercise and weight loss to help with Katrina Montes. Proper sleep hygiene was discussed including the need for 7-8 hours of quality sleep each night. A sleep study was ordered [facility]) based on symptoms and Epworth score.  Katrina Montes Katrina Montes's shortness of breath appears to be obesity related and exercise induced. She has agreed to work on weight loss and gradually increase exercise to treat her exercise induced shortness of breath. If Katrina Montes follows our instructions and loses weight without improvement of her shortness of breath, we will plan to refer to pulmonology. We will monitor this condition regularly. Katrina Montes agrees to this plan.  Katrina Montes We discussed sodium restriction, working on healthy weight loss, and a regular exercise program as the means to achieve improved blood pressure control. Katrina Montes agreed with this plan and agreed to follow up as directed. We will continue to monitor her blood pressure as well as her progress with the above  lifestyle modifications. She will continue her medications as prescribed and will watch for signs of hypotension as she continues her lifestyle modifications.  Katrina Montes II Katrina Montes has been given extensive Katrina Montes education by myself today including ideal fasting and post-prandial blood glucose readings, individual ideal Hgb A1c goals and hypoglycemia prevention. We discussed the importance of good blood sugar control to decrease the likelihood of diabetic complications such as nephropathy, neuropathy, limb loss, blindness, coronary artery disease, and death. We discussed the importance of intensive lifestyle modification including diet, exercise and weight loss as the first line treatment for Katrina Montes. Kelbi agrees to check her blood sugar twice daily and prescription was written today for 1 glucose meter, glucose strips #100 with no refills and lancets #100 with no refills. Narda agrees to continue her Katrina Montes medications and will follow up at the agreed upon time.  Katrina Montes Deficiency Katrina Montes was informed that low Katrina Montes levels contributes to Katrina Montes and are associated with obesity, breast, and colon cancer. She will continue to take OTC Vit Montes '@50'$ ,000 IU every week and will follow up for routine testing of Katrina Montes, at least 2-3 times per year. She was informed of the risk of over-replacement of Katrina Montes and agrees to not increase her dose unless she discusses this with [place] first. We will check Katrina Montes level today and will need to keep  up to MS (Katrina Montes).  Katrina Montes Shirel will follow up with neurology for Katrina Montes and MS. She agrees to follow up with our clinic in 2 weeks.  Katrina Montes The diagnosis of Katrina Montes was discussed with Shiza and was explained in detail. She was given suggestions of Katrina rich foods and Katrina supplement was not prescribed.   Katrina Montes with Emotional Eating Behaviors We discussed behavior modification techniques today to help Arryanna  deal with her emotional eating and Katrina Montes. We will refer to Dr. Mallie Mussel our bariatric psychologist.  Katrina Montes Screen Katrina Montes had a moderately positive Katrina Montes screening. Katrina Montes is commonly associated with obesity and often results in emotional eating behaviors. We will monitor this closely and work on CBT to help improve the non-hunger eating patterns.   Obesity Erla is currently in the action stage of change and her goal is to continue with weight loss efforts She has agreed to follow the Category 2 plan Hazelene has been instructed to work up to a goal of 150 minutes of combined cardio and strengthening exercise per week for weight loss and overall health benefits. We discussed the following Behavioral Modification Strategies today: increase H2O intake, keeping healthy foods in the home, better snacking choices, increasing lean protein intake, decreasing simple carbohydrates, increasing vegetables, decrease eating out, work on meal planning and easy cooking plans, emotional eating strategies and ways to avoid boredom eating  Tzivia has agreed to follow up with our clinic in 2 weeks. She was informed of the importance of frequent follow up visits to maximize her success with intensive lifestyle modifications for her multiple health conditions. She was informed we would discuss her lab results at her next visit unless there is a critical issue that needs to be addressed sooner. Copelyn agreed to keep her next visit at the agreed upon time to discuss these results.  ALLERGIES: No Known Allergies  MEDICATIONS: Current Outpatient Medications on File Prior to Visit  Medication Sig Dispense Refill  . ARTHROTEC 75-0.2 MG TBEC Take 1 tablet by mouth daily as needed.   3  . Blood Glucose Monitoring Suppl (BLOOD GLUCOSE METER KIT AND SUPPLIES) Dispense based on patient and insurance preference. Use to monitor FSBS 1x daily fasting. Dx: 250.00 1 each 0  . cholecalciferol (Katrina Montes) 1000 UNITS  tablet Take 1,000 Units by mouth 2 (two) times daily.    . cyclobenzaprine (FLEXERIL) 10 MG tablet TAKE 1 Tablet BY MOUTH 4 TIMES DAILY AS NEEDED  3  . DiphenhydrAMINE HCl (BENADRYL ALLERGY PO) Take by mouth as needed.    . docusate sodium (COLACE) 50 MG capsule Take 1 capsule (50 mg total) by mouth 2 (two) times daily. 60 capsule 11  . gabapentin (NEURONTIN) 300 MG capsule Take 1 capsule (300 mg total) by mouth 3 (three) times daily. 90 capsule 11  . glucose blood (ACCU-CHEK AVIVA PLUS) test strip Monitor FSBS 1x daily fasting. Dx: 250.00 100 each 3  . ibuprofen (ADVIL,MOTRIN) 200 MG tablet Take 200 mg by mouth every 6 (six) hours as needed.    . Insulin Glargine (BASAGLAR KWIKPEN) 100 UNIT/ML SOPN INJECT 12 UNITS UNDER THE SKIN EVERY NIGHT  3  . JANUMET 50-500 MG tablet Take 1 tablet by mouth daily.  3  . Lancet Devices (ACCU-CHEK SOFTCLIX) lancets Monitor FSBS 1x daily fasting. Dx: 250.00 100 each 3  . lisinopril-hydrochlorothiazide (PRINZIDE,ZESTORETIC) 20-12.5 MG tablet Take 1 tablet by mouth daily.    . meclizine (ANTIVERT) 25 MG tablet Take 1 tablet (25  mg total) by mouth 3 (three) times daily as needed for dizziness. 30 tablet 6  . meloxicam (MOBIC) 15 MG tablet Take 1 tablet (15 mg total) by mouth daily. 90 tablet 1  . Multiple Katrina (MULTIVITAMIN) capsule Take 1 capsule by mouth daily.    . norgestimate-ethinyl estradiol (ORTHO-CYCLEN,SPRINTEC,PREVIFEM) 0.25-35 MG-MCG tablet Take 1 tablet by mouth daily.    Marland Kitchen ocrelizumab (OCREVUS) 300 MG/10ML injection Inject into the vein every 6 (six) months.    Marland Kitchen ocrelizumab 600 mg in sodium chloride 0.9 % 500 mL Inject 600 mg into the vein every 6 (six) months.    . rizatriptan (MAXALT) 10 MG tablet Take 1 tablet (10 mg total) by mouth as needed for migraine. May repeat in 2 hours if needed. Max dose: 2/24 hr or 15/30 days. 45 tablet 3  . topiramate (TOPAMAX) 100 MG tablet Take 1 tablet (100 mg total) by mouth 2 (two) times daily. 180 tablet 3  .  venlafaxine XR (EFFEXOR-XR) 75 MG 24 hr capsule Take 2 capsules (150 mg total) by mouth daily with breakfast. 60 capsule 11  . Calcium-Magnesium-Zinc (CALCIUM-MAGNESUIUM-ZINC PO) Take by mouth.    . Garlic (GARLIQUE PO) Take 1 tablet by mouth daily.    . traZODone (DESYREL) 50 MG tablet Take 1 tablet (50 mg total) by mouth at bedtime. (Patient not taking: Reported on 01/14/2019) 30 tablet 6   No current facility-administered medications on file prior to visit.     PAST MEDICAL HISTORY: Past Medical History:  Diagnosis Date  . Montes   . Anxiety   . Back pain   . Carpal tunnel syndrome   . Constipation   . Katrina Montes   . Katrina Montes mellitus   . Katrina Montes   . Joint pain   . Lactose intolerance   . Multiple sclerosis ([place])   . Optic neuritis   . Shortness of breath   . Swallowing difficulty   . Swelling of both lower extremities   . Vision, loss, sudden   . Katrina Montes deficiency     PAST SURGICAL HISTORY: Past Surgical History:  Procedure Laterality Date  . BREAST REDUCTION SURGERY    . CYST EXCISION    . ovarian cyst removed    . TUBAL LIGATION    . WISDOM TOOTH EXTRACTION      SOCIAL HISTORY: Social History   Tobacco Use  . Smoking status: Never Smoker  . Smokeless tobacco: Never Used  Substance Use Topics  . Alcohol use: No  . Drug use: No    FAMILY HISTORY: Family History  Problem Relation Age of Onset  . Cancer Mother   . Katrina Montes Mother   . Hearing loss Mother   . Thyroid nodules Mother   . Montes Mother   . Kidney disease Mother   . Katrina Montes Mother   . Obesity Mother   . Hyperlipidemia Father   . High blood pressure Father   . Sleep apnea Father   . Montes Sister   . Arthritis Maternal Grandmother   . Arthritis Maternal Grandfather   . Arthritis Paternal Grandmother   . Kidney disease Paternal Grandmother   . Katrina Montes Paternal Grandmother   . Arthritis Paternal Grandfather   . Vision loss Paternal Grandfather   . Stroke Paternal  Grandfather   . Hyperlipidemia Paternal Grandfather   . Montes Sister     ROS: Review of Systems  Constitutional: Positive for malaise/Katrina Montes.  HENT: Positive for hearing loss and tinnitus.        +  Difficult Swallowing  Eyes:       + Vision Changes + Wear Glasses or Contacts + Blurry or Double Vision  Respiratory: Positive for shortness of breath.   Cardiovascular: Positive for orthopnea.       + Shortness of Breath with Activity + Sudden Awakening from Sleep with Shortness of Breath + Calf/Leg Pain with Walking +Leg Cramping + Very Cold Feet or Hands   Gastrointestinal: Positive for constipation, diarrhea and heartburn. Negative for nausea and vomiting.       + Swallowing Difficulty  Genitourinary: Positive for frequency.  Musculoskeletal: Positive for back pain.       + Neck Stiffness + Muscle or Joint Pain + Muscle Stiffness + Red or Swollen Joints  Skin: Positive for itching.       + Dryness + Hair or Nail Changes  Neurological: Positive for dizziness, weakness and headaches.       Negative for lightheadedness  Endo/Heme/Allergies: Positive for polydipsia.       + Polyphagia + Heat or Cold Intolerance  Psychiatric/Behavioral: Positive for Katrina Montes. Negative for suicidal ideas. The patient is nervous/anxious (nervousness) and has insomnia.        + Stress    PHYSICAL EXAM: Blood pressure 127/83, pulse 92, temperature 97.8 F (36.6 C), temperature source Oral, height '5\' 7"'$  (1.702 m), weight 276 lb (125.2 kg), last menstrual period 01/08/2019, SpO2 99 %. Body mass index is 43.23 kg/m. Physical Exam Vitals signs reviewed.  Constitutional:      Appearance: Normal appearance. She is well-developed. She is obese.  HENT:     Head: Normocephalic and atraumatic.     Nose: Nose normal.     Mouth/Throat:     Comments: Mallampati = 4 Eyes:     General: No scleral icterus.    Extraocular Movements: Extraocular movements intact.  Neck:     Musculoskeletal: Normal  range of motion and neck supple.     Thyroid: No thyromegaly.  Cardiovascular:     Rate and Rhythm: Normal rate and regular rhythm.  Pulmonary:     Effort: Pulmonary effort is normal. No respiratory distress.  Abdominal:     Palpations: Abdomen is soft.     Tenderness: There is no abdominal tenderness.  Musculoskeletal: Normal range of motion.     Comments: Range of Motion normal in all 4 extremities   Skin:    General: Skin is warm and dry.  Neurological:     Mental Status: She is alert and oriented to person, place, and time.     Coordination: Coordination normal.  Psychiatric:        Mood and Affect: Mood normal.        Behavior: Behavior normal.        Thought Content: Thought content does not include homicidal or suicidal ideation.     RECENT LABS AND TESTS: BMET    Component Value Date/Time   NA 137 10/21/2018 1213   K 4.7 10/21/2018 1213   CL 104 10/21/2018 1213   CO2 23 10/21/2018 1213   GLUCOSE 237 (H) 10/21/2018 1213   GLUCOSE 208 (H) 11/02/2014 0944   BUN 10 10/21/2018 1213   CREATININE 0.73 10/21/2018 1213   CREATININE 0.70 11/02/2014 0944   CALCIUM 9.3 10/21/2018 1213   GFRNONAA 99 10/21/2018 1213   GFRAA 114 10/21/2018 1213   Lab Results  Component Value Date   HGBA1C 7.7 (H) 06/17/2015   No results found for: INSULIN CBC    Component Value Date/Time  WBC 7.9 10/21/2018 1213   WBC 8.6 11/02/2014 0944   RBC 4.46 10/21/2018 1213   RBC 4.37 11/02/2014 0944   HGB 11.4 10/21/2018 1213   HCT 34.5 10/21/2018 1213   PLT 332 10/21/2018 1213   MCV 77 (L) 10/21/2018 1213   MCH 25.6 (L) 10/21/2018 1213   MCH 25.4 (L) 11/02/2014 0944   MCHC 33.0 10/21/2018 1213   MCHC 31.8 11/02/2014 0944   RDW 14.6 10/21/2018 1213   LYMPHSABS 3.6 (H) 10/21/2018 1213   MONOABS 0.8 11/02/2014 0944   EOSABS 0.1 10/21/2018 1213   BASOSABS 0.0 10/21/2018 1213   Katrina/TIBC/Ferritin/ %Sat    Component Value Date/Time   Katrina 26 (L) 09/26/2010 1713   TIBC 429 09/26/2010  1713   FERRITIN 22 09/26/2010 1713   IRONPCTSAT 6 (L) 09/26/2010 1713   Lipid Panel     Component Value Date/Time   CHOL 208 (H) 11/02/2014 0944   TRIG 114 11/02/2014 0944   HDL 66 11/02/2014 0944   CHOLHDL 3.2 11/02/2014 0944   VLDL 23 11/02/2014 0944   LDLCALC 119 (H) 11/02/2014 0944   Hepatic Function Panel     Component Value Date/Time   PROT 7.2 10/21/2018 1213   ALBUMIN 4.0 10/21/2018 1213   AST 15 10/21/2018 1213   ALT 17 10/21/2018 1213   ALKPHOS 75 10/21/2018 1213   BILITOT 0.2 10/21/2018 1213      Component Value Date/Time   TSH 1.050 10/21/2018 1213   Katrina Montes   Ref. Range 07/28/2014 10:44  Katrina Montes, 25-Hydroxy Latest Ref Range: 30 - 89 ng/mL 15 (L)    ECG  shows NSR with a rate of 89 BPM INDIRECT CALORIMETER done today shows a VO2 of 249 and a REE of 1730. Her calculated basal metabolic rate is 6073 thus her basal metabolic rate is worse than expected.       OBESITY BEHAVIORAL INTERVENTION VISIT  Today's visit was # 1   Starting weight: 276 lbs Starting date: 01/14/2019 Today's weight : 276 lbs Today's date: 01/14/2019 Total lbs lost to date: 0 At least 15 minutes were spent on discussing the following behavioral intervention visit.   ASK: We discussed the diagnosis of obesity with Aowyn Handley today and Mirren agreed to give [place] permission to discuss obesity behavioral modification therapy today.  ASSESS: Ainslie has the diagnosis of obesity and her BMI today is 43.22 Dabney is in the action stage of change   ADVISE: Emmaly was educated on the multiple health risks of obesity as well as the benefit of weight loss to improve her health. She was advised of the need for long term treatment and the importance of lifestyle modifications to improve her current health and to decrease her risk of future health problems.  AGREE: Multiple dietary modification options and treatment options were discussed and  Dajah agreed to follow the  recommendations documented in the above note.  ARRANGE: Erika was educated on the importance of frequent visits to treat obesity as outlined per CMS and USPSTF guidelines and agreed to schedule her next follow up appointment today.   Corey Skains, am acting as Location manager for [employer]. Owens Shark, DO  I have reviewed the above documentation for accuracy and completeness, and I agree with the above. -Jearld Lesch, DO

## 2019-01-21 ENCOUNTER — Encounter (INDEPENDENT_AMBULATORY_CARE_PROVIDER_SITE_OTHER): Payer: Self-pay | Admitting: Bariatrics

## 2019-01-21 ENCOUNTER — Ambulatory Visit (INDEPENDENT_AMBULATORY_CARE_PROVIDER_SITE_OTHER): Payer: Medicare Other | Admitting: Family Medicine

## 2019-01-21 DIAGNOSIS — G35 Multiple sclerosis: Secondary | ICD-10-CM | POA: Diagnosis not present

## 2019-01-22 ENCOUNTER — Other Ambulatory Visit (INDEPENDENT_AMBULATORY_CARE_PROVIDER_SITE_OTHER): Payer: Self-pay

## 2019-01-22 DIAGNOSIS — E119 Type 2 diabetes mellitus without complications: Secondary | ICD-10-CM

## 2019-01-22 MED ORDER — BLOOD GLUCOSE MONITOR KIT: PACK | 0 refills | Status: DC

## 2019-01-22 MED ORDER — GLUCOSE BLOOD VI STRP: ORAL_STRIP | 0 refills | Status: DC

## 2019-01-22 MED ORDER — ACCU-CHEK SAFE-T PRO LANCETS MISC: 0 refills | Status: DC

## 2019-01-28 ENCOUNTER — Other Ambulatory Visit (INDEPENDENT_AMBULATORY_CARE_PROVIDER_SITE_OTHER): Payer: Self-pay | Admitting: Bariatrics

## 2019-01-28 DIAGNOSIS — E119 Type 2 diabetes mellitus without complications: Secondary | ICD-10-CM

## 2019-01-30 ENCOUNTER — Ambulatory Visit (INDEPENDENT_AMBULATORY_CARE_PROVIDER_SITE_OTHER): Payer: Medicare Other | Admitting: Bariatrics

## 2019-01-30 ENCOUNTER — Encounter (INDEPENDENT_AMBULATORY_CARE_PROVIDER_SITE_OTHER): Payer: Self-pay | Admitting: Bariatrics

## 2019-01-30 VITALS — BP 119/78 | HR 83 | Temp 98.3°F | Ht 67.0 in | Wt 286.0 lb

## 2019-01-30 DIAGNOSIS — Z6841 Body Mass Index (BMI) 40.0 and over, adult: Secondary | ICD-10-CM

## 2019-01-30 DIAGNOSIS — E559 Vitamin D deficiency, unspecified: Secondary | ICD-10-CM | POA: Diagnosis not present

## 2019-01-30 DIAGNOSIS — R739 Hyperglycemia, unspecified: Secondary | ICD-10-CM | POA: Diagnosis not present

## 2019-01-30 DIAGNOSIS — E119 Type 2 diabetes mellitus without complications: Secondary | ICD-10-CM

## 2019-01-30 DIAGNOSIS — Z9189 Other specified personal risk factors, not elsewhere classified: Secondary | ICD-10-CM

## 2019-01-30 MED ORDER — VITAMIN D (ERGOCALCIFEROL) 1.25 MG (50000 UNIT) PO CAPS: 50000.0000 [IU] | ORAL_CAPSULE | ORAL | 0 refills | Status: DC

## 2019-01-30 MED ORDER — BLOOD GLUCOSE MONITOR KIT: PACK | 0 refills | Status: DC

## 2019-01-30 MED ORDER — ACCU-CHEK SAFE-T PRO LANCETS MISC: 0 refills | Status: DC

## 2019-01-30 MED ORDER — GLUCOSE BLOOD VI STRP: ORAL_STRIP | 0 refills | Status: DC

## 2019-01-31 LAB — HEMOGLOBIN A1C
Est. average glucose Bld gHb Est-mCnc: 278 mg/dL
Hgb A1c MFr Bld: 11.3 % — ABNORMAL HIGH (ref 4.8–5.6)

## 2019-02-03 ENCOUNTER — Encounter (INDEPENDENT_AMBULATORY_CARE_PROVIDER_SITE_OTHER): Payer: Self-pay | Admitting: Bariatrics

## 2019-02-03 NOTE — Progress Notes (Signed)
Office: [phone]  /  Fax: [phone]   HPI:   Chief Complaint: OBESITY Katrina Montes is here to discuss her progress with her obesity treatment plan. She is on the Category 2 plan and is following her eating plan approximately 80 % of the time. She states she is exercising 0 minutes 0 times per week. Katrina Montes did not struggle with her plan. She is drinking more water and she did eat some "sweets". She did not feel hunger on the meal plan.  Her weight is 286 lb (129.7 kg) today and has had a weight gain of 10 pounds over a period of 2 weeks since her last visit. She has lost 0 lbs since starting treatment with [place].  Vitamin D deficiency Katrina Montes has a diagnosis of vitamin D deficiency. She is not currently taking vit D and denies nausea, vomiting, or muscle weakness.  Diabetes II Katrina Montes has a diagnosis of diabetes type II. Katrina Montes states that her last A1c was over 10.0. She is taking Janumet 50-'500mg'$  BID and Basaglar 20 units qhs. She has been working on intensive lifestyle modifications including diet, exercise, and weight loss to help control her blood glucose levels.  ASSESSMENT AND PLAN:  Type 2 diabetes mellitus without complication, without long-term current use of insulin (HCC) - Plan: glucose blood (ACCU-CHEK AVIVA) test strip, Lancets (ACCU-CHEK SAFE-T PRO) lancets, blood glucose meter kit and supplies KIT  Vitamin D deficiency - Plan: Vitamin D, Ergocalciferol, (DRISDOL) 1.25 MG (50000 UT) CAPS capsule, CANCELED: VITAMIN D 25 Hydroxy (Vit-D Deficiency, Fractures)  At risk for osteoporosis  Class 3 severe obesity with serious comorbidity and body mass index (BMI) of 40.0 to 44.9 in adult, unspecified obesity type (HCC)  PLAN:  Vitamin D Deficiency Katrina Montes was informed that low vitamin D levels contributes to fatigue and are associated with obesity, breast, and colon cancer. Katrina Montes agrees to start to take prescription Vit D '@50'$ ,000 IU every week #4 with no refills and will follow up  for routine testing of vitamin D, at least 2-3 times per year. She was informed of the risk of over-replacement of vitamin D and agrees to not increase her dose unless she discusses this with [place] first. Katrina Montes agrees to follow up in 2 weeks as directed.  Diabetes II Katrina Montes has been given extensive diabetes education by myself today including ideal fasting and post-prandial blood glucose readings, individual ideal Hgb A1c goals, and hypoglycemia prevention. We discussed the importance of good blood sugar control to decrease the likelihood of diabetic complications such as nephropathy, neuropathy, limb loss, blindness, coronary artery disease, and death. We discussed the importance of intensive lifestyle modification including diet, exercise and weight loss as the first line treatment for diabetes. Katrina Montes agrees to continue her diabetes medications and we will prescribe her a blood glucose meter with supplies kit and Accu Chek-Aviva test strips #100. Katrina Montes will follow up at the agreed upon time.   Obesity Katrina Montes is currently in the action stage of change. As such, her goal is to continue with weight loss efforts. She has agreed to follow the Category 2 plan with meal planning and increasing water intake. Katrina Montes has been instructed to work up to a goal of 150 minutes of combined cardio and strengthening exercise per week for weight loss and overall health benefits. We discussed the following Behavioral Modification Strategies today: increasing lean protein intake, decreasing simple carbohydrates, increasing vegetables, increase H2O intake, no skipping meals, and work on meal planning and easy cooking plans.  Katrina Montes has  agreed to follow up with our clinic in 2 weeks. She was informed of the importance of frequent follow up visits to maximize her success with intensive lifestyle modifications for her multiple health conditions.  ALLERGIES: No Known Allergies  MEDICATIONS: Current Outpatient Medications  on File Prior to Visit  Medication Sig Dispense Refill  . ARTHROTEC 75-0.2 MG TBEC Take 1 tablet by mouth daily as needed.   3  . Blood Glucose Monitoring Suppl (BLOOD GLUCOSE METER KIT AND SUPPLIES) Dispense based on patient and insurance preference. Use to monitor FSBS 1x daily fasting. Dx: 250.00 1 each 0  . Calcium-Magnesium-Zinc (CALCIUM-MAGNESUIUM-ZINC PO) Take by mouth.    . cholecalciferol (VITAMIN D) 1000 UNITS tablet Take 1,000 Units by mouth 2 (two) times daily.    . cyclobenzaprine (FLEXERIL) 10 MG tablet TAKE 1 Tablet BY MOUTH 4 TIMES DAILY AS NEEDED  3  . DiphenhydrAMINE HCl (BENADRYL ALLERGY PO) Take by mouth as needed.    . docusate sodium (COLACE) 50 MG capsule Take 1 capsule (50 mg total) by mouth 2 (two) times daily. 60 capsule 11  . gabapentin (NEURONTIN) 300 MG capsule Take 1 capsule (300 mg total) by mouth 3 (three) times daily. 90 capsule 11  . Garlic (GARLIQUE PO) Take 1 tablet by mouth daily.    Marland Kitchen ibuprofen (ADVIL,MOTRIN) 200 MG tablet Take 200 mg by mouth every 6 (six) hours as needed.    . Insulin Glargine (BASAGLAR KWIKPEN) 100 UNIT/ML SOPN INJECT 12 UNITS UNDER THE SKIN EVERY NIGHT  3  . JANUMET 50-500 MG tablet Take 1 tablet by mouth daily.  3  . Lancet Devices (ACCU-CHEK SOFTCLIX) lancets Monitor FSBS 1x daily fasting. Dx: 250.00 100 each 3  . lisinopril-hydrochlorothiazide (PRINZIDE,ZESTORETIC) 20-12.5 MG tablet Take 1 tablet by mouth daily.    . meclizine (ANTIVERT) 25 MG tablet Take 1 tablet (25 mg total) by mouth 3 (three) times daily as needed for dizziness. 30 tablet 6  . meloxicam (MOBIC) 15 MG tablet Take 1 tablet (15 mg total) by mouth daily. 90 tablet 1  . Multiple Vitamin (MULTIVITAMIN) capsule Take 1 capsule by mouth daily.    . norgestimate-ethinyl estradiol (ORTHO-CYCLEN,SPRINTEC,PREVIFEM) 0.25-35 MG-MCG tablet Take 1 tablet by mouth daily.    Marland Kitchen ocrelizumab (OCREVUS) 300 MG/10ML injection Inject into the vein every 6 (six) months.    Marland Kitchen ocrelizumab 600  mg in sodium chloride 0.9 % 500 mL Inject 600 mg into the vein every 6 (six) months.    . rizatriptan (MAXALT) 10 MG tablet Take 1 tablet (10 mg total) by mouth as needed for migraine. May repeat in 2 hours if needed. Max dose: 2/24 hr or 15/30 days. 45 tablet 3  . topiramate (TOPAMAX) 100 MG tablet Take 1 tablet (100 mg total) by mouth 2 (two) times daily. 180 tablet 3  . traZODone (DESYREL) 50 MG tablet Take 1 tablet (50 mg total) by mouth at bedtime. 30 tablet 6  . venlafaxine XR (EFFEXOR-XR) 75 MG 24 hr capsule Take 2 capsules (150 mg total) by mouth daily with breakfast. 60 capsule 11   No current facility-administered medications on file prior to visit.     PAST MEDICAL HISTORY: Past Medical History:  Diagnosis Date  . Anemia   . Anxiety   . Back pain   . Carpal tunnel syndrome   . Constipation   . Depression   . Diabetes mellitus   . Hypertension   . Joint pain   . Lactose intolerance   . Multiple  sclerosis ([place])   . Optic neuritis   . Shortness of breath   . Swallowing difficulty   . Swelling of both lower extremities   . Vision, loss, sudden   . Vitamin D deficiency     PAST SURGICAL HISTORY: Past Surgical History:  Procedure Laterality Date  . BREAST REDUCTION SURGERY    . CYST EXCISION    . ovarian cyst removed    . TUBAL LIGATION    . WISDOM TOOTH EXTRACTION      SOCIAL HISTORY: Social History   Tobacco Use  . Smoking status: Never Smoker  . Smokeless tobacco: Never Used  Substance Use Topics  . Alcohol use: No  . Drug use: No    FAMILY HISTORY: Family History  Problem Relation Age of Onset  . Cancer Mother   . Diabetes Mother   . Hearing loss Mother   . Thyroid nodules Mother   . Anemia Mother   . Kidney disease Mother   . Hypertension Mother   . Obesity Mother   . Hyperlipidemia Father   . High blood pressure Father   . Sleep apnea Father   . Anemia Sister   . Arthritis Maternal Grandmother   . Arthritis Maternal Grandfather   .  Arthritis Paternal Grandmother   . Kidney disease Paternal Grandmother   . Hypertension Paternal Grandmother   . Arthritis Paternal Grandfather   . Vision loss Paternal Grandfather   . Stroke Paternal Grandfather   . Hyperlipidemia Paternal Grandfather   . Anemia Sister     ROS: Review of Systems  Constitutional: Negative for weight loss.  Gastrointestinal: Negative for nausea and vomiting.  Musculoskeletal:       Negative for muscle weakness.    PHYSICAL EXAM: Blood pressure 119/78, pulse 83, temperature 98.3 F (36.8 C), weight 286 lb (129.7 kg), last menstrual period 01/08/2019, SpO2 100 %. Body mass index is 44.79 kg/m. Physical Exam Vitals signs reviewed.  Constitutional:      Appearance: Normal appearance. She is obese.  Cardiovascular:     Rate and Rhythm: Normal rate.  Pulmonary:     Effort: Pulmonary effort is normal.  Musculoskeletal: Normal range of motion.  Skin:    General: Skin is warm and dry.  Neurological:     Mental Status: She is alert and oriented to person, place, and time.  Psychiatric:        Mood and Affect: Mood normal.        Behavior: Behavior normal.     RECENT LABS AND TESTS: BMET    Component Value Date/Time   NA 137 10/21/2018 1213   K 4.7 10/21/2018 1213   CL 104 10/21/2018 1213   CO2 23 10/21/2018 1213   GLUCOSE 237 (H) 10/21/2018 1213   GLUCOSE 208 (H) 11/02/2014 0944   BUN 10 10/21/2018 1213   CREATININE 0.73 10/21/2018 1213   CREATININE 0.70 11/02/2014 0944   CALCIUM 9.3 10/21/2018 1213   GFRNONAA 99 10/21/2018 1213   GFRAA 114 10/21/2018 1213   Lab Results  Component Value Date   HGBA1C 11.3 (H) 01/30/2019   HGBA1C 7.7 (H) 06/17/2015   HGBA1C 8.6 (H) 11/02/2014   HGBA1C 11.7 (H) 07/28/2014   HGBA1C 9.9 10/24/2010   No results found for: INSULIN CBC    Component Value Date/Time   WBC 7.9 10/21/2018 1213   WBC 8.6 11/02/2014 0944   RBC 4.46 10/21/2018 1213   RBC 4.37 11/02/2014 0944   HGB 11.4 10/21/2018  1213  HCT 34.5 10/21/2018 1213   PLT 332 10/21/2018 1213   MCV 77 (L) 10/21/2018 1213   MCH 25.6 (L) 10/21/2018 1213   MCH 25.4 (L) 11/02/2014 0944   MCHC 33.0 10/21/2018 1213   MCHC 31.8 11/02/2014 0944   RDW 14.6 10/21/2018 1213   LYMPHSABS 3.6 (H) 10/21/2018 1213   MONOABS 0.8 11/02/2014 0944   EOSABS 0.1 10/21/2018 1213   BASOSABS 0.0 10/21/2018 1213   Iron/TIBC/Ferritin/ %Sat    Component Value Date/Time   IRON 26 (L) 09/26/2010 1713   TIBC 429 09/26/2010 1713   FERRITIN 22 09/26/2010 1713   IRONPCTSAT 6 (L) 09/26/2010 1713   Lipid Panel     Component Value Date/Time   CHOL 208 (H) 11/02/2014 0944   TRIG 114 11/02/2014 0944   HDL 66 11/02/2014 0944   CHOLHDL 3.2 11/02/2014 0944   VLDL 23 11/02/2014 0944   LDLCALC 119 (H) 11/02/2014 0944   Hepatic Function Panel     Component Value Date/Time   PROT 7.2 10/21/2018 1213   ALBUMIN 4.0 10/21/2018 1213   AST 15 10/21/2018 1213   ALT 17 10/21/2018 1213   ALKPHOS 75 10/21/2018 1213   BILITOT 0.2 10/21/2018 1213      Component Value Date/Time   TSH 1.050 10/21/2018 1213   TSH 1.250 02/26/2018 1631   TSH 1.281 07/28/2014 1044   Results for SAHARA, FUJIMOTO (MRN [id number]) as of 02/03/2019 09:28  Ref. Range 01/14/2019 10:37  Vitamin D, 25-Hydroxy Latest Ref Range: 30.0 - 100.0 ng/mL 13.9 (L)    OBESITY BEHAVIORAL INTERVENTION VISIT  Today's visit was # 2   Starting weight: 276 lbs Starting date: 01/14/19 Today's weight : Weight: 286 lb (129.7 kg)  Today's date: 01/30/2019 Total lbs lost to date: 0 At least 15 minutes were spent on discussing the following behavioral intervention visit.  ASK: We discussed the diagnosis of obesity with Kadijah Christians today and Scarlettrose agreed to give [place] permission to discuss obesity behavioral modification therapy today.  ASSESS: Quetzalli has the diagnosis of obesity and her BMI today is 44.7. Liylah is in the action stage of change.   ADVISE: Ela was educated on the  multiple health risks of obesity as well as the benefit of weight loss to improve her health. She was advised of the need for long term treatment and the importance of lifestyle modifications to improve her current health and to decrease her risk of future health problems.  AGREE: Multiple dietary modification options and treatment options were discussed and Jupiter agreed to follow the recommendations documented in the above note.  ARRANGE: Infantof was educated on the importance of frequent visits to treat obesity as outlined per CMS and USPSTF guidelines and agreed to schedule her next follow up appointment today.  I, Marcille Blanco, CMA, am acting as Location manager for [employer]. Owens Shark, DO  I have reviewed the above documentation for accuracy and completeness, and I agree with the above. -Jearld Lesch, DO

## 2019-02-13 ENCOUNTER — Encounter: Payer: Self-pay | Admitting: Physician Assistant

## 2019-02-13 DIAGNOSIS — E103293 Type 1 diabetes mellitus with mild nonproliferative diabetic retinopathy without macular edema, bilateral: Secondary | ICD-10-CM | POA: Diagnosis not present

## 2019-02-13 DIAGNOSIS — H524 Presbyopia: Secondary | ICD-10-CM | POA: Diagnosis not present

## 2019-02-13 DIAGNOSIS — H52223 Regular astigmatism, bilateral: Secondary | ICD-10-CM | POA: Diagnosis not present

## 2019-02-13 DIAGNOSIS — H5203 Hypermetropia, bilateral: Secondary | ICD-10-CM | POA: Diagnosis not present

## 2019-02-17 ENCOUNTER — Ambulatory Visit (INDEPENDENT_AMBULATORY_CARE_PROVIDER_SITE_OTHER): Payer: Medicare Other | Admitting: Bariatrics

## 2019-02-17 ENCOUNTER — Encounter (INDEPENDENT_AMBULATORY_CARE_PROVIDER_SITE_OTHER): Payer: Self-pay | Admitting: Bariatrics

## 2019-02-17 VITALS — BP 117/78 | HR 91 | Temp 98.5°F | Ht 67.0 in | Wt 281.0 lb

## 2019-02-17 DIAGNOSIS — I10 Essential (primary) hypertension: Secondary | ICD-10-CM

## 2019-02-17 DIAGNOSIS — Z794 Long term (current) use of insulin: Secondary | ICD-10-CM | POA: Diagnosis not present

## 2019-02-17 DIAGNOSIS — E119 Type 2 diabetes mellitus without complications: Secondary | ICD-10-CM | POA: Diagnosis not present

## 2019-02-17 DIAGNOSIS — Z6841 Body Mass Index (BMI) 40.0 and over, adult: Secondary | ICD-10-CM | POA: Diagnosis not present

## 2019-02-17 MED ORDER — BASAGLAR KWIKPEN 100 UNIT/ML ~~LOC~~ SOPN: PEN_INJECTOR | SUBCUTANEOUS | 0 refills | Status: DC

## 2019-02-17 MED ORDER — JANUMET 50-500 MG PO TABS: 1.0000 | ORAL_TABLET | Freq: Every day | ORAL | 0 refills | Status: DC

## 2019-02-17 MED ORDER — LISINOPRIL-HYDROCHLOROTHIAZIDE 20-12.5 MG PO TABS: 1.0000 | ORAL_TABLET | Freq: Every day | ORAL | 0 refills | Status: DC

## 2019-02-17 NOTE — Progress Notes (Signed)
Office: [phone]  /  Fax: [phone]   HPI:   Chief Complaint: OBESITY Katrina Montes is here to discuss her progress with her obesity treatment plan. She is on the Category 2 plan and is following her eating plan approximately 70 % of the time. She states she is exercising 0 minutes 0 times per week. Katrina Montes is in need of a primary care physician and she is working on getting a primary care physician. She is following the plan. Her last meal is hard to get in due to the restrictions.  Her weight is 281 lb (127.5 kg) today and has had a weight loss of 5 pounds over a period of 2 to 3 weeks since her last visit. She has lost 0 lbs since starting treatment with [place].  Hypertension Katrina Montes is a 48 y.o. female with hypertension. Katrina Montes's blood pressure is controlled. She is taking lisinopril-hydrochlorothiazide. She denies lightheadedness. She is working on weight loss to help control her blood pressure with the goal of decreasing her risk of heart attack and stroke.   Diabetes II Katrina Montes has a diagnosis of diabetes type II. Katrina Montes is taking Janumet 50-500 mg 1 tablet daily and Basaglar. She states fasting BGs range between 180 and 227 and 2 hour post prandials range in 240's, and she denies any lows. Last A1c was 11.3. She denies hypoglycemia. She has been working on intensive lifestyle modifications including diet, exercise, and weight loss to help control her blood glucose levels.  ASSESSMENT AND PLAN:  Essential hypertension - Plan: lisinopril-hydrochlorothiazide (PRINZIDE,ZESTORETIC) 20-12.5 MG tablet  Type 2 diabetes mellitus without complication, with long-term current use of insulin (HCC) - Plan: Insulin Glargine (BASAGLAR KWIKPEN) 100 UNIT/ML SOPN, JANUMET 50-500 MG tablet  Class 3 severe obesity with serious comorbidity and body mass index (BMI) of 40.0 to 44.9 in adult, unspecified obesity type (HCC)  PLAN:  Hypertension We discussed sodium restriction, working on healthy  weight loss, and a regular exercise program as the means to achieve improved blood pressure control. Katrina Montes agreed with this plan and agreed to follow up as directed. We will continue to monitor her blood pressure as well as her progress with the above lifestyle modifications. Katrina Montes agrees to continue taking lisinopril-hydrochlorothiazide 20-12.5 mg 1 tablet daily #30 and we will refill for 1 month. She will watch for signs of hypotension as she continues her lifestyle modifications. Katrina Montes agrees to follow up with our clinic in 2 weeks.  Diabetes II Katrina Montes has been given extensive diabetes education by myself today including ideal fasting and post-prandial blood glucose readings, individual ideal Hgb A1c goals and hypoglycemia prevention. We discussed the importance of good blood sugar control to decrease the likelihood of diabetic complications such as nephropathy, neuropathy, limb loss, blindness, coronary artery disease, and death. We discussed the importance of intensive lifestyle modification including diet, exercise and weight loss as the first line treatment for diabetes. Katrina Montes agrees to continue Basaglar 20 units at night 1 month supply with no refills, and she agrees to continue Jaumet 50-500 mg 1 tablet daily #30 and we will refill for 1 month. Katrina Montes agrees to follow up with our clinic in 2 weeks.  Obesity Katrina Montes is currently in the action stage of change. As such, her goal is to continue with weight loss efforts She has agreed to follow the Category 2 plan Katrina Montes has been instructed to work up to a goal of 150 minutes of combined cardio and strengthening exercise per week for weight loss and overall health  benefits. We discussed the following Behavioral Modification Strategies today: increasing lean protein intake, decreasing simple carbohydrates, increasing vegetables, decrease eating out, work on meal planning and easy cooking plans, increase H20 intake, no skipping meals, and keeping  healthy foods in the home Katrina Montes will continue taking food to her meetings.  Katrina Montes has agreed to follow up with our clinic in 2 weeks. She was informed of the importance of frequent follow up visits to maximize her success with intensive lifestyle modifications for her multiple health conditions.  ALLERGIES: No Known Allergies  MEDICATIONS: Current Outpatient Medications on File Prior to Visit  Medication Sig Dispense Refill  . ARTHROTEC 75-0.2 MG TBEC Take 1 tablet by mouth daily as needed.   3  . blood glucose meter kit and supplies KIT Dispense based on patient and insurance preference. Use up to two times daily as directed. (FOR ICD-9 250.00, 250.01). 1 each 0  . Blood Glucose Monitoring Suppl (BLOOD GLUCOSE METER KIT AND SUPPLIES) Dispense based on patient and insurance preference. Use to monitor FSBS 1x daily fasting. Dx: 250.00 1 each 0  . Calcium-Magnesium-Zinc (CALCIUM-MAGNESUIUM-ZINC PO) Take by mouth.    . cholecalciferol (VITAMIN D) 1000 UNITS tablet Take 1,000 Units by mouth 2 (two) times daily.    . cyclobenzaprine (FLEXERIL) 10 MG tablet TAKE 1 Tablet BY MOUTH 4 TIMES DAILY AS NEEDED  3  . DiphenhydrAMINE HCl (BENADRYL ALLERGY PO) Take by mouth as needed.    . docusate sodium (COLACE) 50 MG capsule Take 1 capsule (50 mg total) by mouth 2 (two) times daily. 60 capsule 11  . gabapentin (NEURONTIN) 300 MG capsule Take 1 capsule (300 mg total) by mouth 3 (three) times daily. 90 capsule 11  . Garlic (GARLIQUE PO) Take 1 tablet by mouth daily.    Marland Kitchen glucose blood (ACCU-CHEK AVIVA) test strip Check BS twice daily 100 each 0  . ibuprofen (ADVIL,MOTRIN) 200 MG tablet Take 200 mg by mouth every 6 (six) hours as needed.    Katrina Guise Devices (ACCU-CHEK SOFTCLIX) lancets Monitor FSBS 1x daily fasting. Dx: 250.00 100 each 3  . Lancets (ACCU-CHEK SAFE-T PRO) lancets Check BS twice daily 100 each 0  . meclizine (ANTIVERT) 25 MG tablet Take 1 tablet (25 mg total) by mouth 3 (three) times daily  as needed for dizziness. 30 tablet 6  . meloxicam (MOBIC) 15 MG tablet Take 1 tablet (15 mg total) by mouth daily. 90 tablet 1  . Multiple Vitamin (MULTIVITAMIN) capsule Take 1 capsule by mouth daily.    . norgestimate-ethinyl estradiol (ORTHO-CYCLEN,SPRINTEC,PREVIFEM) 0.25-35 MG-MCG tablet Take 1 tablet by mouth daily.    Marland Kitchen ocrelizumab (OCREVUS) 300 MG/10ML injection Inject into the vein every 6 (six) months.    Marland Kitchen ocrelizumab 600 mg in sodium chloride 0.9 % 500 mL Inject 600 mg into the vein every 6 (six) months.    . rizatriptan (MAXALT) 10 MG tablet Take 1 tablet (10 mg total) by mouth as needed for migraine. May repeat in 2 hours if needed. Max dose: 2/24 hr or 15/30 days. 45 tablet 3  . topiramate (TOPAMAX) 100 MG tablet Take 1 tablet (100 mg total) by mouth 2 (two) times daily. 180 tablet 3  . traZODone (DESYREL) 50 MG tablet Take 1 tablet (50 mg total) by mouth at bedtime. 30 tablet 6  . venlafaxine XR (EFFEXOR-XR) 75 MG 24 hr capsule Take 2 capsules (150 mg total) by mouth daily with breakfast. 60 capsule 11  . Vitamin D, Ergocalciferol, (DRISDOL) 1.25 MG (  50000 UT) CAPS capsule Take 1 capsule (50,000 Units total) by mouth every 7 (seven) days. 4 capsule 0   No current facility-administered medications on file prior to visit.     PAST MEDICAL HISTORY: Past Medical History:  Diagnosis Date  . Anemia   . Anxiety   . Back pain   . Carpal tunnel syndrome   . Constipation   . Depression   . Diabetes mellitus   . Hypertension   . Joint pain   . Lactose intolerance   . Multiple sclerosis ([place])   . Optic neuritis   . Shortness of breath   . Swallowing difficulty   . Swelling of both lower extremities   . Vision, loss, sudden   . Vitamin D deficiency     PAST SURGICAL HISTORY: Past Surgical History:  Procedure Laterality Date  . BREAST REDUCTION SURGERY    . CYST EXCISION    . ovarian cyst removed    . TUBAL LIGATION    . WISDOM TOOTH EXTRACTION      SOCIAL  HISTORY: Social History   Tobacco Use  . Smoking status: Never Smoker  . Smokeless tobacco: Never Used  Substance Use Topics  . Alcohol use: No  . Drug use: No    FAMILY HISTORY: Family History  Problem Relation Age of Onset  . Cancer Mother   . Diabetes Mother   . Hearing loss Mother   . Thyroid nodules Mother   . Anemia Mother   . Kidney disease Mother   . Hypertension Mother   . Obesity Mother   . Hyperlipidemia Father   . High blood pressure Father   . Sleep apnea Father   . Anemia Sister   . Arthritis Maternal Grandmother   . Arthritis Maternal Grandfather   . Arthritis Paternal Grandmother   . Kidney disease Paternal Grandmother   . Hypertension Paternal Grandmother   . Arthritis Paternal Grandfather   . Vision loss Paternal Grandfather   . Stroke Paternal Grandfather   . Hyperlipidemia Paternal Grandfather   . Anemia Sister     ROS: Review of Systems  Constitutional: Positive for weight loss.  Neurological:       Negative lightheadedness  Endo/Heme/Allergies:       Negative hypoglycemia    PHYSICAL EXAM: Blood pressure 117/78, pulse 91, temperature 98.5 F (36.9 C), temperature source Oral, height '5\' 7"'$  (1.702 m), weight 281 lb (127.5 kg), SpO2 98 %. Body mass index is 44.01 kg/m. Physical Exam Vitals signs reviewed.  Constitutional:      Appearance: Normal appearance. She is obese.  Cardiovascular:     Rate and Rhythm: Normal rate.     Pulses: Normal pulses.  Pulmonary:     Effort: Pulmonary effort is normal.     Breath sounds: Normal breath sounds.  Musculoskeletal: Normal range of motion.  Skin:    General: Skin is warm and dry.  Neurological:     Mental Status: She is alert and oriented to person, place, and time.  Psychiatric:        Mood and Affect: Mood normal.        Behavior: Behavior normal.     RECENT LABS AND TESTS: BMET    Component Value Date/Time   NA 137 10/21/2018 1213   K 4.7 10/21/2018 1213   CL 104 10/21/2018  1213   CO2 23 10/21/2018 1213   GLUCOSE 237 (H) 10/21/2018 1213   GLUCOSE 208 (H) 11/02/2014 0944   BUN 10 10/21/2018 1213  CREATININE 0.73 10/21/2018 1213   CREATININE 0.70 11/02/2014 0944   CALCIUM 9.3 10/21/2018 1213   GFRNONAA 99 10/21/2018 1213   GFRAA 114 10/21/2018 1213   Lab Results  Component Value Date   HGBA1C 11.3 (H) 01/30/2019   HGBA1C 7.7 (H) 06/17/2015   HGBA1C 8.6 (H) 11/02/2014   HGBA1C 11.7 (H) 07/28/2014   HGBA1C 9.9 10/24/2010   No results found for: INSULIN CBC    Component Value Date/Time   WBC 7.9 10/21/2018 1213   WBC 8.6 11/02/2014 0944   RBC 4.46 10/21/2018 1213   RBC 4.37 11/02/2014 0944   HGB 11.4 10/21/2018 1213   HCT 34.5 10/21/2018 1213   PLT 332 10/21/2018 1213   MCV 77 (L) 10/21/2018 1213   MCH 25.6 (L) 10/21/2018 1213   MCH 25.4 (L) 11/02/2014 0944   MCHC 33.0 10/21/2018 1213   MCHC 31.8 11/02/2014 0944   RDW 14.6 10/21/2018 1213   LYMPHSABS 3.6 (H) 10/21/2018 1213   MONOABS 0.8 11/02/2014 0944   EOSABS 0.1 10/21/2018 1213   BASOSABS 0.0 10/21/2018 1213   Iron/TIBC/Ferritin/ %Sat    Component Value Date/Time   IRON 26 (L) 09/26/2010 1713   TIBC 429 09/26/2010 1713   FERRITIN 22 09/26/2010 1713   IRONPCTSAT 6 (L) 09/26/2010 1713   Lipid Panel     Component Value Date/Time   CHOL 208 (H) 11/02/2014 0944   TRIG 114 11/02/2014 0944   HDL 66 11/02/2014 0944   CHOLHDL 3.2 11/02/2014 0944   VLDL 23 11/02/2014 0944   LDLCALC 119 (H) 11/02/2014 0944   Hepatic Function Panel     Component Value Date/Time   PROT 7.2 10/21/2018 1213   ALBUMIN 4.0 10/21/2018 1213   AST 15 10/21/2018 1213   ALT 17 10/21/2018 1213   ALKPHOS 75 10/21/2018 1213   BILITOT 0.2 10/21/2018 1213      Component Value Date/Time   TSH 1.050 10/21/2018 1213   TSH 1.250 02/26/2018 1631   TSH 1.281 07/28/2014 1044      OBESITY BEHAVIORAL INTERVENTION VISIT  Today's visit was # 3   Starting weight: 276 lbs Starting date: 01/14/2019 Today's  weight : 281 lbs Today's date: 02/17/2019 Total lbs lost to date: 0 At least 15 minutes were spent on discussing the following behavioral intervention visit.    02/17/2019  Height '5\' 7"'$  (1.702 m)  Weight 281 lb (127.5 kg)  BMI (Calculated) 44  BLOOD PRESSURE - SYSTOLIC 253  BLOOD PRESSURE - DIASTOLIC 78   Body Fat % 66.4 %    ASK: We discussed the diagnosis of obesity with Katrina Montes today and Katrina Montes agreed to give [place] permission to discuss obesity behavioral modification therapy today.  ASSESS: Katrina Montes has the diagnosis of obesity and her BMI today is 85 Mario is in the action stage of change   ADVISE: Katrina Montes was educated on the multiple health risks of obesity as well as the benefit of weight loss to improve her health. She was advised of the need for long term treatment and the importance of lifestyle modifications to improve her current health and to decrease her risk of future health problems.  AGREE: Multiple dietary modification options and treatment options were discussed and  Katrina Montes agreed to follow the recommendations documented in the above note.  ARRANGE: Katrina Montes was educated on the importance of frequent visits to treat obesity as outlined per CMS and USPSTF guidelines and agreed to schedule her next follow up appointment today.  Katrina Montes, am  acting as Location manager for [employer], DO  I have reviewed the above documentation for accuracy and completeness, and I agree with the above. -Jearld Lesch, DO

## 2019-02-24 ENCOUNTER — Ambulatory Visit: Payer: Medicare Other | Admitting: Neurology

## 2019-02-24 NOTE — Progress Notes (Signed)
PATIENT: Katrina Montes DOB: 1970/12/29  REASON FOR VISIT: follow up HISTORY FROM: patient  HISTORY OF PRESENT ILLNESS: Today 02/25/19  HISTORY  HISTORY Katrina Simpsonis 67 right-handed African-American female, accompanied by her mother, referred by her primary care physician Dr. Buelah Manis for evaluation of right optic neuritis, abnormal MRI of the brain, suspicious for relapsing remitting multiple sclerosis, initial evaluation was on Jan 14 2015.  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  Relapsing Remitting Multiple Sclerosis was diagnosed since January 2016: Based on her abnormal MRI of the brain, there was also cervical spinal cord involvement  MRI of the brain without contrast at [facility] imaging January 2016 showed multiple periventricular white matter disease, significant involvement of corpus callosum, perpendicular to ventricle, consistent with multiple sclerosis, now was also suspicious for right optic nerve edema, with hyperintensity signal at T2, FLAIR, no contrast enhancement, MRA of brain was normal.  MRI of the cervical spine with and without contrast in Jan 2016: showing hyperintense foci within the spinal cord posteriorly at C4 and posteriolaterally at C7-T1 consistent with multiple sclerosis plaques. There were no enhancing foci.  Spinal fluid testing January 13 2015, total protein was 708, RBC was 1600, RBC was 7,, cloudy reddish-looking,elevated IgG 17.7, IgG index 0.97, IgG synthetic rate  238, 0 oligoclonal bands,  Visual evoked potential showed prolongation on the right side.  She was treated with IVSolu-Medrol for 3 daysin Jan2016, which onlymild improved her right vision, has significant worsening of her blood glucose level.She was treated with Achtar injection in Feb 2016,but still having significant residual visual difficulty.  JC virus was positive with titer of 1.1 3 (Jan 14 2015), laboratory evaluation showed normal or negative CPK, HIV, protein electrophoresis, positive varicella-zoster virus antibody, mild elevated ESR, C-reactive protein, hepatitis panel, Lyme titer,showed anemia, hemoglobin 11.8, otherwise normal CBC, normal CMP, with exception of elevated glucose, normal TSH,  She was treated withTysarbrilsinceMarch 14th, 2016,  Most recent repeat MRI of the brain with and without contrast in April 2019 showed multiple supratentorium lesions, no contrast enhancement, no significant change compared to previous MRI of the brain  She continues to have significant limitations  Vision:History of right optic neuritis, with partial recovery, now complains of blurry vision, difficult to read anything without bleeders or magnified glasses, difficult to read through computer screen or watch TV, visual acuity, OS 20/40, OD 20/50-1  Cognitive impairment:has to take frequent notes, difficulty to concentrate, to express herself.  Gait abnormality: Treated easily, generalized weakness, also complicated by her low back pain,leftsided radicular pain,  Spasticity:Bilateral lower extremity spasticity, is taking gabapentin 300 mg 3 times a day previously tried and failed baclofen  Fatigue: Severe fatigue, also complicated by her depression anxiety chronic insomnia, body achy pain, limit her daily activity more than 50%,she has heavy menstruation-like cycle, chronic anemia, recent hemoglobin was 10.6.   Chronic migraine: She also has history of chronic  migraine headaches, retro-orbital area severe pounding headache with associated light noise sensitivity, and happened frequently,up to 3  to four times each week, Maxalt as needed was helpful,  Severe low back pain, radiating pain to left lower extremity, MRI of lumbar in July 201 there is minimum anterolisthesis of L4 upon L5, due to severe facet hypertrophy, associated with left ligamentum flavum hypertrophy and disc protrusion, there is moderate Left foraminal and lateral recess stenosis, potential for left L4-5 nerve root compression.  Diabetic peripheral neuropathy, Progressive worsening bilateral feet paresthesia, nowtaking insulin  Depression anxiety, chronic insomnia, polypharmacy treatment: Trazodone 50 mg every night, gabapentin 300 mg 3 times a day, Topamax 100 mg twice a day, Flexeril as needed  UPDATE Oct 21 2018: She is accompanied by her mother at today's clinical visit, constellation of complaints, generalized fatigue, left wrist pain, not feeling well, there is no MS flareup,  Recent JC virus titer was 1.3 in October 2019, she has been on [place] since March 2016, she would have 1 out of 1000 chance of having PML infection,  After discussed with patient, with her constellation of complaints, significant lesion noted on MRI of brain, will proceed with ocrelizumab,  She also complains of new onset left wrist pain, tender to touch  UPDATE February 25, 2019 SS: She has been on Tysabri since March 2016, with elevated JC virus titer 1.3, at her last visit in November 2019 she agreed to be switched to [place].  She had her first Ocrevus infusion February 2020.  She had MRI of the brain in April 2019, did not show any enhancing lesions.  She reports sometimes she does not know how she should feel because she also has underlying hypertension and diabetes which have been out-of-control.  She is currently working with a wellness center to gain control of these conditons. She works  as an in Astronomer.   Weakness in hands-she reports continued weakness in both hands but more on the right, trouble opening things, dropping things.  She did see orthopedics for her left wrist and was given a cortisone injection and this is better.  Memory-she reports she is forgetful, has to write things down, has to concentrate  Bladder-she reports urinary frequency, thinks is related to hypertension/diabetes, she denies any incontinence  Balance-she reports she will often feel off balance, trip, denies any falls  Depression-she is tearful, hopes that she starts having more energy, feeling better as more time goes since her initial Ocrevus infusion, and is taking Effexor 150 mg daily.  Fatigue-she reports daily fatigue, is having a sleep study soon, takes trazodone 50 mg at bedtime, was told that she snores  Hearing Loss-she reports occasional ringing in her ears, notices her hearing is not as good as it used to be, having to read lips, this is embarrassing to her  She recently got Medicare and will be seeing her primary care doctor, Dr. Buelah Manis soon for a physical.  Prior that she had health department.  She does report some blurry vision however she saw an eye doctor recently and she will be getting new glasses soon.   REVIEW OF SYSTEMS: Out of a complete 14 system review of symptoms, the patient complains only of the following symptoms, and all other reviewed systems are negative.  Activity change, appetite change, fatigue, hearing loss, ringing in ears, trouble swallowing, eye discharge, light sensitivity, double vision, loss of vision, blurred vision, shortness of breath, choking, leg swelling, cold intolerance, heat intolerance, excessive thirst, excessive eating, constipation, nausea, restless leg, insomnia, daytime sleepiness, snoring, urgency, joint pain, joint swelling, joint back pain, aching muscles,  muscle cramps, walk difficulty, neck pain, neck stiffness, itching, memory loss,  dizziness, headache, numbness, speech difficulty, weakness, confusion, decreased concentration, depression, nervous/anxious  ALLERGIES: No Known Allergies  HOME MEDICATIONS: Outpatient Medications Prior to Visit  Medication Sig Dispense Refill  . ARTHROTEC 75-0.2 MG TBEC Take 1 tablet by mouth daily as needed.   3  . blood glucose meter kit and supplies KIT Dispense based on patient and insurance preference. Use up to two times daily as directed. (FOR ICD-9 250.00, 250.01). 1 each 0  . Blood Glucose Monitoring Suppl (BLOOD GLUCOSE METER KIT AND SUPPLIES) Dispense based on patient and insurance preference. Use to monitor FSBS 1x daily fasting. Dx: 250.00 1 each 0  . Calcium-Magnesium-Zinc (CALCIUM-MAGNESUIUM-ZINC PO) Take by mouth.    . cholecalciferol (VITAMIN D) 1000 UNITS tablet Take 1,000 Units by mouth 2 (two) times daily.    . cyclobenzaprine (FLEXERIL) 10 MG tablet TAKE 1 Tablet BY MOUTH 4 TIMES DAILY AS NEEDED  3  . DiphenhydrAMINE HCl (BENADRYL ALLERGY PO) Take by mouth as needed.    . docusate sodium (COLACE) 50 MG capsule Take 1 capsule (50 mg total) by mouth 2 (two) times daily. 60 capsule 11  . gabapentin (NEURONTIN) 300 MG capsule Take 1 capsule (300 mg total) by mouth 3 (three) times daily. 90 capsule 11  . Garlic (GARLIQUE PO) Take 1 tablet by mouth daily.    Marland Kitchen glucose blood (ACCU-CHEK AVIVA) test strip Check BS twice daily 100 each 0  . ibuprofen (ADVIL,MOTRIN) 200 MG tablet Take 200 mg by mouth every 6 (six) hours as needed.    . Insulin Glargine (BASAGLAR KWIKPEN) 100 UNIT/ML SOPN INJECT 20 UNITS UNDER THE SKIN EVERY NIGHT 10 pen 0  . JANUMET 50-500 MG tablet Take 1 tablet by mouth daily. 30 tablet 0  . Lancet Devices (ACCU-CHEK SOFTCLIX) lancets Monitor FSBS 1x daily fasting. Dx: 250.00 100 each 3  . Lancets (ACCU-CHEK SAFE-T PRO) lancets Check BS twice daily 100 each 0  . lisinopril-hydrochlorothiazide (PRINZIDE,ZESTORETIC) 20-12.5 MG tablet Take 1 tablet by mouth daily.  30 tablet 0  . meclizine (ANTIVERT) 25 MG tablet Take 1 tablet (25 mg total) by mouth 3 (three) times daily as needed for dizziness. 30 tablet 6  . meloxicam (MOBIC) 15 MG tablet Take 1 tablet (15 mg total) by mouth daily. 90 tablet 1  . Multiple Vitamin (MULTIVITAMIN) capsule Take 1 capsule by mouth daily.    . norgestimate-ethinyl estradiol (ORTHO-CYCLEN,SPRINTEC,PREVIFEM) 0.25-35 MG-MCG tablet Take 1 tablet by mouth daily.    Marland Kitchen ocrelizumab (OCREVUS) 300 MG/10ML injection Inject into the vein every 6 (six) months.    Marland Kitchen ocrelizumab 600 mg in sodium chloride 0.9 % 500 mL Inject 600 mg into the vein every 6 (six) months.    . rizatriptan (MAXALT) 10 MG tablet Take 1 tablet (10 mg total) by mouth as needed for migraine. May repeat in 2 hours if needed. Max dose: 2/24 hr or 15/30 days. 45 tablet 3  . topiramate (TOPAMAX) 100 MG tablet Take 1 tablet (100 mg total) by mouth 2 (two) times daily. 180 tablet 3  . traZODone (DESYREL) 50 MG tablet Take 1 tablet (50 mg total) by mouth at bedtime. 30 tablet 6  . venlafaxine XR (EFFEXOR-XR) 75 MG 24 hr capsule Take 2 capsules (150 mg total) by mouth daily with breakfast. 60 capsule 11  . Vitamin D, Ergocalciferol, (DRISDOL) 1.25 MG (50000 UT) CAPS capsule Take 1 capsule (50,000 Units total) by mouth every 7 (seven) days.  4 capsule 0   No facility-administered medications prior to visit.     PAST MEDICAL HISTORY: Past Medical History:  Diagnosis Date  . Anemia   . Anxiety   . Back pain   . Carpal tunnel syndrome   . Constipation   . Depression   . Diabetes mellitus   . Hypertension   . Joint pain   . Lactose intolerance   . Multiple sclerosis ([place])   . Optic neuritis   . Shortness of breath   . Swallowing difficulty   . Swelling of both lower extremities   . Vision, loss, sudden   . Vitamin D deficiency     PAST SURGICAL HISTORY: Past Surgical History:  Procedure Laterality Date  . BREAST REDUCTION SURGERY    . CYST EXCISION    . ovarian  cyst removed    . TUBAL LIGATION    . WISDOM TOOTH EXTRACTION      FAMILY HISTORY: Family History  Problem Relation Age of Onset  . Cancer Mother   . Diabetes Mother   . Hearing loss Mother   . Thyroid nodules Mother   . Anemia Mother   . Kidney disease Mother   . Hypertension Mother   . Obesity Mother   . Hyperlipidemia Father   . High blood pressure Father   . Sleep apnea Father   . Anemia Sister   . Arthritis Maternal Grandmother   . Arthritis Maternal Grandfather   . Arthritis Paternal Grandmother   . Kidney disease Paternal Grandmother   . Hypertension Paternal Grandmother   . Arthritis Paternal Grandfather   . Vision loss Paternal Grandfather   . Stroke Paternal Grandfather   . Hyperlipidemia Paternal Grandfather   . Anemia Sister     SOCIAL HISTORY: Social History   Socioeconomic History  . Marital status: Single    Spouse name: Not on file  . Number of children: 0  . Years of education: College  . Highest education level: Not on file  Occupational History  . 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 Financial resource strain: Not on file  . Food insecurity:    Worry: Not on file    Inability: Not on file  . Transportation needs:    Medical: Not on file    Non-medical: Not on file  Tobacco Use  . Smoking status: Never Smoker  . Smokeless tobacco: Never Used  Substance and Sexual Activity  . Alcohol use: No  . Drug use: No  . Sexual activity: Not Currently    Birth control/protection: Pill  Lifestyle  . Physical activity:    Days per week: Not on file    Minutes per session: Not on file  . Stress: Not on file  Relationships  . Social connections:    Talks on phone: Not on file    Gets together: Not on file    Attends religious service: Not on file    Active member of club or organization: Not on file    Attends meetings of clubs or organizations: Not on file    Relationship status: Not on file  . Intimate partner violence:    Fear of  current or ex partner: Not on file    Emotionally abused: Not on file    Physically abused: Not on file    Forced sexual activity: Not on file  Other Topics Concern  . Not on file  Social History Narrative   Live at home with parents.  Right handed.   Uses caffeine sparingly.      PHYSICAL EXAM  Vitals:   02/25/19 0801  BP: 139/87  Pulse: 89  Weight: 288 lb 9.6 oz (130.9 kg)  Height: '5\' 7"'$  (1.702 m)   Body mass index is 45.2 kg/m.  Generalized: Well developed, in no acute distress   Neurological examination  Mentation: Alert oriented to time, place, history taking. Follows all commands speech and language fluent Cranial nerve II-XII: Pupils were equal round reactive to light. Extraocular movements were full, visual field were full on confrontational test. Facial sensation and strength were normal. Uvula tongue midline. Head turning and shoulder shrug  were normal and symmetric. Motor: The motor testing reveals 4 over 5 strength of all 4 extremities, mild right finger abduction weakness. Good symmetric motor tone is noted throughout.  Sensory: Sensory testing is intact to soft touch on all 4 extremities. No evidence of extinction is noted.  Coordination: Cerebellar testing reveals good finger-nose-finger and heel-to-shin bilaterally.  Gait and station: Gait is mildly unsteady. Tandem gait is unsteady. Romberg is negative. No drift is seen.  Reflexes: Deep tendon reflexes are symmetric and decreased bilaterally.   DIAGNOSTIC DATA (LABS, IMAGING, TESTING) - I reviewed patient records, labs, notes, testing and imaging myself where available.  Lab Results  Component Value Date   WBC 7.9 10/21/2018   HGB 11.4 10/21/2018   HCT 34.5 10/21/2018   MCV 77 (L) 10/21/2018   PLT 332 10/21/2018      Component Value Date/Time   NA 137 10/21/2018 1213   K 4.7 10/21/2018 1213   CL 104 10/21/2018 1213   CO2 23 10/21/2018 1213   GLUCOSE 237 (H) 10/21/2018 1213   GLUCOSE 208 (H)  11/02/2014 0944   BUN 10 10/21/2018 1213   CREATININE 0.73 10/21/2018 1213   CREATININE 0.70 11/02/2014 0944   CALCIUM 9.3 10/21/2018 1213   PROT 7.2 10/21/2018 1213   ALBUMIN 4.0 10/21/2018 1213   AST 15 10/21/2018 1213   ALT 17 10/21/2018 1213   ALKPHOS 75 10/21/2018 1213   BILITOT 0.2 10/21/2018 1213   GFRNONAA 99 10/21/2018 1213   GFRAA 114 10/21/2018 1213   Lab Results  Component Value Date   CHOL 208 (H) 11/02/2014   HDL 66 11/02/2014   LDLCALC 119 (H) 11/02/2014   TRIG 114 11/02/2014   CHOLHDL 3.2 11/02/2014   Lab Results  Component Value Date   HGBA1C 11.3 (H) 01/30/2019   No results found for: VITAMINB12 Lab Results  Component Value Date   TSH 1.050 10/21/2018      ASSESSMENT AND PLAN 48 y.o. year old female  has a past medical history of Anemia, Anxiety, Back pain, Carpal tunnel syndrome, Constipation, Depression, Diabetes mellitus, Hypertension, Joint pain, Lactose intolerance, Multiple sclerosis (HCC), Optic neuritis, Shortness of breath, Swallowing difficulty, Swelling of both lower extremities, Vision, loss, sudden, and Vitamin D deficiency. here with:  1.  Multiple sclerosis, relapsing remitting 2.  Depression, fatigue   She had her first Ocrevus infusion February 2020.  She denies any new neurological symptoms.  She complains of continued weakness in both hands, difficulty opening things, dropping some things.  Also, continued difficulty with memory, having to write things down.  She reports daytime fatigue.  Fortunately, she will be having a sleep evaluation at our office tomorrow.  We will await the findings of the sleep study, to see if this explains any of the memory, fatigue, depression.  She feels that if she could sleep better  at night she may feel less fatigued, depressed during the day.  She has appointment with her primary care doctor soon.  She is working with the wellness center to gain control of her diabetes, hypertension, weight .  We discussed  not adding a pill at this time for depression or fatigue.  In the future we may need to readdress this.  I will refer her to ENT for a hearing evaluation. I will check lab work today for [employer].  She will follow-up in 3 months or sooner if needed.  I advised her that if her symptoms worsen or if she develops any new symptoms she should let us know.  Her next Ocrevus infusion is in August 2020.  At her last visit she complained of left wrist pain, she reports this is much better after getting a cortisone injection.    Butler Denmark, AGNP-C, DNP 02/25/2019, 8:43 AM [facility] [address] [place],  [address] [phone]

## 2019-02-25 ENCOUNTER — Encounter: Payer: Self-pay | Admitting: Neurology

## 2019-02-25 ENCOUNTER — Ambulatory Visit (INDEPENDENT_AMBULATORY_CARE_PROVIDER_SITE_OTHER): Payer: Medicare Other | Admitting: Neurology

## 2019-02-25 VITALS — BP 139/87 | HR 89 | Ht 67.0 in | Wt 288.6 lb

## 2019-02-25 DIAGNOSIS — G35 Multiple sclerosis: Secondary | ICD-10-CM

## 2019-02-25 DIAGNOSIS — H9193 Unspecified hearing loss, bilateral: Secondary | ICD-10-CM

## 2019-02-26 ENCOUNTER — Other Ambulatory Visit: Payer: Self-pay

## 2019-02-26 ENCOUNTER — Telehealth: Payer: Self-pay

## 2019-02-26 ENCOUNTER — Ambulatory Visit (INDEPENDENT_AMBULATORY_CARE_PROVIDER_SITE_OTHER): Payer: Medicare Other | Admitting: Neurology

## 2019-02-26 ENCOUNTER — Encounter: Payer: Self-pay | Admitting: Neurology

## 2019-02-26 VITALS — BP 141/82 | HR 102 | Ht 66.0 in | Wt 286.0 lb

## 2019-02-26 DIAGNOSIS — Z820 Family history of epilepsy and other diseases of the nervous system: Secondary | ICD-10-CM

## 2019-02-26 DIAGNOSIS — R51 Headache: Secondary | ICD-10-CM | POA: Diagnosis not present

## 2019-02-26 DIAGNOSIS — R0683 Snoring: Secondary | ICD-10-CM | POA: Diagnosis not present

## 2019-02-26 DIAGNOSIS — R0689 Other abnormalities of breathing: Secondary | ICD-10-CM | POA: Diagnosis not present

## 2019-02-26 DIAGNOSIS — Z6841 Body Mass Index (BMI) 40.0 and over, adult: Secondary | ICD-10-CM | POA: Diagnosis not present

## 2019-02-26 DIAGNOSIS — G4719 Other hypersomnia: Secondary | ICD-10-CM | POA: Diagnosis not present

## 2019-02-26 DIAGNOSIS — R519 Headache, unspecified: Secondary | ICD-10-CM

## 2019-02-26 LAB — COMPREHENSIVE METABOLIC PANEL
ALBUMIN: 3.9 g/dL (ref 3.8–4.8)
ALT: 21 IU/L (ref 0–32)
AST: 13 IU/L (ref 0–40)
Albumin/Globulin Ratio: 1.3 (ref 1.2–2.2)
Alkaline Phosphatase: 70 IU/L (ref 39–117)
BUN/Creatinine Ratio: 13 (ref 9–23)
BUN: 9 mg/dL (ref 6–24)
Bilirubin Total: 0.3 mg/dL (ref 0.0–1.2)
CO2: 21 mmol/L (ref 20–29)
Calcium: 9.3 mg/dL (ref 8.7–10.2)
Chloride: 102 mmol/L (ref 96–106)
Creatinine, Ser: 0.7 mg/dL (ref 0.57–1.00)
GFR calc Af Amer: 120 mL/min/{1.73_m2} (ref >59)
GFR calc non Af Amer: 104 mL/min/{1.73_m2} (ref >59)
GLOBULIN, TOTAL: 3.1 g/dL (ref 1.5–4.5)
Glucose: 275 mg/dL — ABNORMAL HIGH (ref 65–99)
Potassium: 4.3 mmol/L (ref 3.5–5.2)
SODIUM: 139 mmol/L (ref 134–144)
Total Protein: 7 g/dL (ref 6.0–8.5)

## 2019-02-26 LAB — CBC WITH DIFFERENTIAL/PLATELET
BASOS: 1 %
Basophils Absolute: 0 10*3/uL (ref 0.0–0.2)
EOS (ABSOLUTE): 0 10*3/uL (ref 0.0–0.4)
Eos: 1 %
Hematocrit: 36.3 % (ref 34.0–46.6)
Hemoglobin: 11.5 g/dL (ref 11.1–15.9)
IMMATURE GRANS (ABS): 0 10*3/uL (ref 0.0–0.1)
Immature Granulocytes: 0 %
LYMPHS: 24 %
Lymphocytes Absolute: 1.2 10*3/uL (ref 0.7–3.1)
MCH: 26.1 pg — ABNORMAL LOW (ref 26.6–33.0)
MCHC: 31.7 g/dL (ref 31.5–35.7)
MCV: 82 fL (ref 79–97)
Monocytes Absolute: 0.6 10*3/uL (ref 0.1–0.9)
Monocytes: 12 %
Neutrophils Absolute: 3 10*3/uL (ref 1.4–7.0)
Neutrophils: 62 %
Platelets: 343 10*3/uL (ref 150–450)
RBC: 4.41 x10E6/uL (ref 3.77–5.28)
RDW: 14.9 % (ref 11.7–15.4)
WBC: 4.8 10*3/uL (ref 3.4–10.8)

## 2019-02-26 NOTE — Patient Instructions (Signed)

## 2019-02-26 NOTE — Telephone Encounter (Signed)
Spoke with the patient and she verbalized understanding her results. No questions or concerns at this time.   

## 2019-02-26 NOTE — Telephone Encounter (Signed)
-----   Message from Glean Salvo, NP sent at 02/26/2019 10:21 AM EDT ----- Please call the patient and let her know that her lab work was unremarkable with the exception of an elevated glucose 275. Thank you.

## 2019-02-26 NOTE — Progress Notes (Signed)
Subjective:    Patient ID: Katrina Montes is a 48 y.o. female.  HPI     Star Age, MD, PhD [facility] [address], [address] [place], [place] [address]  Dear Dr. Owens Shark,  I saw your patient, Katrina Montes, upon your kind request in the sleep clinic today for initial consultation of her sleep disorder, in particular, concern for underlying obstructive sleep apnea. The patient is unaccompanied today. As you know, Katrina Montes is a 48 year old right-handed woman with an underlying medical history of vitamin D deficiency, multiple sclerosis (sees Dr. Krista Blue), hypertension, diabetes, depression, anxiety, anemia, and morbid obesity with BMI of over 40, who reports snoring and excessive daytime somnolence. I reviewed your office note from 01/14/2019. Her Epworth sleepiness score is 15 out of 24 today, fatigue score is 53 out of 63 today. She is single and lives with her parents, no children. She does not sleep well through the night. She does not necessarily have a set schedule for her bedtime, she may be in bed as early as 7 or 8. 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 She doesn't typically nap during the day. Rise time is between 5:30 and 6:45 AM. She does not have night to night nocturia but has occasional morning headaches, her father has sleep apnea and has a CPAP machine, paternal aunt also has a sleep apnea diagnosis. For sleep difficulty at night she has taken melatonin over-the-counter, some years ago she also tried trazodone. She has some anxiety at night, she has woken up with a sense of gasping for air and this scared her. She denies any palpitations or chest pain. She has symptoms of neuropathy including tingling and numbness, she denies frank restless leg symptoms. Her weight has been fluctuating. Her diabetes has not been under good control lately.  Her Past Medical History Is Significant For: Past Medical History:  Diagnosis Date  . Anemia   .  Anxiety   . Back pain   . Carpal tunnel syndrome   . Constipation   . Depression   . Diabetes mellitus   . Hypertension   . Joint pain   . Lactose intolerance   . Multiple sclerosis ([place])   . Optic neuritis   . Shortness of breath   . Swallowing difficulty   . Swelling of both lower extremities   . Vision, loss, sudden   . Vitamin D deficiency     Her Past Surgical History Is Significant For: Past Surgical History:  Procedure Laterality Date  . BREAST REDUCTION SURGERY    . CYST EXCISION    . ovarian cyst removed    . TUBAL LIGATION    . WISDOM TOOTH EXTRACTION      Her Family History Is Significant For: Family History  Problem Relation Age of Onset  . Cancer Mother   . Diabetes Mother   . Hearing loss Mother   . Thyroid nodules Mother   . Anemia Mother   . Kidney disease Mother   . Hypertension Mother   . Obesity Mother   . Hyperlipidemia Father   . High blood pressure Father   . Sleep apnea Father   . Anemia Sister   . Arthritis Maternal Grandmother   . Arthritis Maternal Grandfather   . Arthritis Paternal Grandmother   . Kidney disease Paternal Grandmother   . Hypertension Paternal Grandmother   . Arthritis Paternal Grandfather   . Vision loss Paternal Grandfather   . Stroke  Paternal Grandfather   . Hyperlipidemia Paternal Grandfather   . Anemia Sister     Her Social History Is Significant For: Social History   Socioeconomic History  . Marital status: Single    Spouse name: Not on file  . Number of children: 0  . Years of education: College  . Highest education level: Not on file  Occupational History  . 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 Financial resource strain: Not on file  . Food insecurity:    Worry: Not on file    Inability: Not on file  . Transportation needs:    Medical: Not on file    Non-medical: Not on file  Tobacco Use  . Smoking status: Never Smoker  . Smokeless tobacco: Never Used  Substance and Sexual Activity   . Alcohol use: No  . Drug use: No  . Sexual activity: Not Currently    Birth control/protection: Pill  Lifestyle  . Physical activity:    Days per week: Not on file    Minutes per session: Not on file  . Stress: Not on file  Relationships  . Social connections:    Talks on phone: Not on file    Gets together: Not on file    Attends religious service: Not on file    Active member of club or organization: Not on file    Attends meetings of clubs or organizations: Not on file    Relationship status: Not on file  Other Topics Concern  . Not on file  Social History Narrative   Live at home with parents.   Right handed.   Uses caffeine sparingly.    Her Allergies Are:  No Known Allergies:   Her Current Medications Are:  Outpatient Encounter Medications as of 02/26/2019  Medication Sig  . ARTHROTEC 75-0.2 MG TBEC Take 1 tablet by mouth daily as needed.   . blood glucose meter kit and supplies KIT Dispense based on patient and insurance preference. Use up to two times daily as directed. (FOR ICD-9 250.00, 250.01).  . Blood Glucose Monitoring Suppl (BLOOD GLUCOSE METER KIT AND SUPPLIES) Dispense based on patient and insurance preference. Use to monitor FSBS 1x daily fasting. Dx: 250.00  . Calcium-Magnesium-Zinc (CALCIUM-MAGNESUIUM-ZINC PO) Take by mouth.  . cholecalciferol (VITAMIN D) 1000 UNITS tablet Take 1,000 Units by mouth 2 (two) times daily.  . cyclobenzaprine (FLEXERIL) 10 MG tablet TAKE 1 Tablet BY MOUTH 4 TIMES DAILY AS NEEDED  . DiphenhydrAMINE HCl (BENADRYL ALLERGY PO) Take by mouth as needed.  . docusate sodium (COLACE) 50 MG capsule Take 1 capsule (50 mg total) by mouth 2 (two) times daily.  Marland Kitchen gabapentin (NEURONTIN) 300 MG capsule Take 1 capsule (300 mg total) by mouth 3 (three) times daily.  . Garlic (GARLIQUE PO) Take 1 tablet by mouth daily.  Marland Kitchen glucose blood (ACCU-CHEK AVIVA) test strip Check BS twice daily  . ibuprofen (ADVIL,MOTRIN) 200 MG tablet Take 200 mg by  mouth every 6 (six) hours as needed.  . Insulin Glargine (BASAGLAR KWIKPEN) 100 UNIT/ML SOPN INJECT 20 UNITS UNDER THE SKIN EVERY NIGHT  . JANUMET 50-500 MG tablet Take 1 tablet by mouth daily.  Elmore Guise Devices (ACCU-CHEK SOFTCLIX) lancets Monitor FSBS 1x daily fasting. Dx: 250.00  . Lancets (ACCU-CHEK SAFE-T PRO) lancets Check BS twice daily  . lisinopril-hydrochlorothiazide (PRINZIDE,ZESTORETIC) 20-12.5 MG tablet Take 1 tablet by mouth daily.  . meclizine (ANTIVERT) 25 MG tablet Take 1 tablet (25 mg total) by mouth 3 (three)  times daily as needed for dizziness.  . meloxicam (MOBIC) 15 MG tablet Take 1 tablet (15 mg total) by mouth daily.  . Multiple Vitamin (MULTIVITAMIN) capsule Take 1 capsule by mouth daily.  . norgestimate-ethinyl estradiol (ORTHO-CYCLEN,SPRINTEC,PREVIFEM) 0.25-35 MG-MCG tablet Take 1 tablet by mouth daily.  Marland Kitchen ocrelizumab (OCREVUS) 300 MG/10ML injection Inject into the vein every 6 (six) months.  Marland Kitchen ocrelizumab 600 mg in sodium chloride 0.9 % 500 mL Inject 600 mg into the vein every 6 (six) months.  . rizatriptan (MAXALT) 10 MG tablet Take 1 tablet (10 mg total) by mouth as needed for migraine. May repeat in 2 hours if needed. Max dose: 2/24 hr or 15/30 days.  Marland Kitchen topiramate (TOPAMAX) 100 MG tablet Take 1 tablet (100 mg total) by mouth 2 (two) times daily.  . traZODone (DESYREL) 50 MG tablet Take 1 tablet (50 mg total) by mouth at bedtime.  Marland Kitchen venlafaxine XR (EFFEXOR-XR) 75 MG 24 hr capsule Take 2 capsules (150 mg total) by mouth daily with breakfast.  . Vitamin D, Ergocalciferol, (DRISDOL) 1.25 MG (50000 UT) CAPS capsule Take 1 capsule (50,000 Units total) by mouth every 7 (seven) days.   No facility-administered encounter medications on file as of 02/26/2019.   :  Review of Systems:  Out of a complete 14 point review of systems, all are reviewed and negative with the exception of these symptoms as listed below:  Review of Systems  Neurological:       Pt presents today to  discuss her sleep. Pt has never had a sleep study but does endorse snoring.   Epworth Sleepiness Scale 0= would never doze 1= slight chance of dozing 2= moderate chance of dozing 3= high chance of dozing  Sitting and reading: 2 Watching TV: 3 Sitting inactive in a public place (ex. Theater or meeting): 2 As a passenger in a car for an hour without a break: 3 Lying down to rest in the afternoon: 3 Sitting and talking to someone: 1 Sitting quietly after lunch (no alcohol): 1 In a car, while stopped in traffic: 0 Total: 15     Objective:  Neurological Exam  Physical Exam Physical Examination:   Vitals:   02/26/19 1601  BP: (!) 141/82  Pulse: (!) 102    General Examination: The patient is a very pleasant 48 y.o. female in no acute distress. She appears well-developed and well-nourished and well groomed.   HEENT: Normocephalic, atraumatic, pupils are equal, round and reactive to light, extraocular tracking is good, speech is clear, maybe slightly dysarthric at times. Hearing is grossly intact. Face is symmetric. Airway examination reveals mild mouth dryness, moderate airway crowding with tonsils are 2-3+, thicker soft palate, smaller airway entry noted. Mallampati is class III. Neck circumference is 16 inches. She has a mild to moderate overbite. Tongue protrudes centrally and palate elevates symmetrically.  Chest: Clear to auscultation without wheezing, rhonchi or crackles noted.  Heart: S1+S2+0, regular and normal without murmurs, rubs or gallops noted.   Abdomen: Soft, non-tender and non-distended with normal bowel sounds appreciated on auscultation.  Extremities: There is no pitting edema in the distal lower extremities bilaterally.   Skin: Warm and dry without trophic changes noted.  Musculoskeletal: exam reveals no obvious joint deformities, tenderness or joint swelling or erythema.   Neurologically:  Mental status: The patient is awake, alert and oriented in all 4  spheres. Her immediate and remote memory, attention, language skills and fund of knowledge are appropriate. There is no evidence of aphasia,  agnosia, apraxia or anomia. Speech is clear with normal prosody and enunciation. Thought process is linear. Mood is normal and affect is normal.  Cranial nerves II - XII are as described above under HEENT exam. In addition: shoulder shrug is normal with equal shoulder height noted. Motor exam: Normal bulk, strength and tone is noted. There is no tremor. Romberg is negative. Fine motor skills and coordination: intact with normal finger taps, normal hand movements, normal rapid alternating patting, normal foot taps and normal foot agility.  Cerebellar testing: No dysmetria or intention tremor. There is no truncal or gait ataxia.  Sensory exam: intact to light touch in the upper and lower extremities.  Gait, station and balance: She stands easily. No veering to one side is noted. No leaning to one side is noted. Posture is age-appropriate and stance is narrow based. Gait shows normal stride length and normal pace. No problems turning are noted. Tandem walk is not possible for her, unsteady.   Assessment and Plan:  In summary, Alea Schoenberg is a very pleasant 48 y.o.-year old female with an underlying medical history of vitamin D deficiency, multiple sclerosis (sees Dr. Krista Blue), hypertension, diabetes, depression, anxiety, anemia, and morbid obesity with BMI of over 40, whose history and physical exam are concerning for obstructive sleep apnea (OSA). I had a long chat with the patient about my findings and the diagnosis of OSA, its prognosis and treatment options. We talked about medical treatments, surgical interventions and non-pharmacological approaches. I explained in particular the risks and ramifications of untreated moderate to severe OSA, especially with respect to developing cardiovascular disease down the Road, including congestive heart failure, difficult to treat  hypertension, cardiac arrhythmias, or stroke. Even type 2 diabetes has, in part, been linked to untreated OSA. Symptoms of untreated OSA include daytime sleepiness, memory problems, mood irritability and mood disorder such as depression and anxiety, lack of energy, as well as recurrent headaches, especially morning headaches. We talked about trying to maintain a healthy lifestyle in general, as well as the importance of weight control. I encouraged the patient to eat healthy, exercise daily and keep well hydrated, to keep a scheduled bedtime and wake time routine, to not skip any meals and eat healthy snacks in between meals. I advised the patient not to drive when feeling sleepy. I recommended the following at this time: sleep study with potential positive airway pressure titration. (We will score hypopneas at 4%).   I explained the sleep test procedure to the patient and also outlined possible surgical and non-surgical treatment options of OSA, including the use of a custom-made dental device (which would require a referral to a specialist dentist or oral surgeon), upper airway surgical options, such as pillar implants, radiofrequency surgery, tongue base surgery, and UPPP (which would involve a referral to an ENT surgeon), Inspire implantable device.  I also explained the CPAP treatment option to the patient, who indicated that she would be willing to try CPAP if the need arises. I explained the importance of being compliant with PAP treatment, not only for insurance purposes but primarily to improve Her symptoms, and for the patient's long term health benefit, including to reduce Her cardiovascular risks. I answered all her questions today and the patient was in agreement. I plan to see her back after the sleep study is completed and encouraged her to call with any interim questions, concerns, problems or updates.   Thank you very much for allowing me to participate in the care of this nice  patient. If I  can be of any further assistance to you please do not hesitate to call me at [phone].  Sincerely,   Star Age, MD, PhD

## 2019-03-04 ENCOUNTER — Other Ambulatory Visit (INDEPENDENT_AMBULATORY_CARE_PROVIDER_SITE_OTHER): Payer: Self-pay | Admitting: Bariatrics

## 2019-03-04 DIAGNOSIS — E559 Vitamin D deficiency, unspecified: Secondary | ICD-10-CM

## 2019-03-04 NOTE — Progress Notes (Signed)
I have reviewed and agreed above plan. 

## 2019-03-06 ENCOUNTER — Ambulatory Visit (INDEPENDENT_AMBULATORY_CARE_PROVIDER_SITE_OTHER): Payer: Medicare Other | Admitting: Bariatrics

## 2019-03-06 ENCOUNTER — Encounter (INDEPENDENT_AMBULATORY_CARE_PROVIDER_SITE_OTHER): Payer: Self-pay | Admitting: Bariatrics

## 2019-03-06 ENCOUNTER — Other Ambulatory Visit: Payer: Self-pay

## 2019-03-06 VITALS — BP 129/92 | HR 88 | Temp 97.9°F | Ht 66.0 in | Wt 281.0 lb

## 2019-03-06 DIAGNOSIS — Z9189 Other specified personal risk factors, not elsewhere classified: Secondary | ICD-10-CM

## 2019-03-06 DIAGNOSIS — H401111 Primary open-angle glaucoma, right eye, mild stage: Secondary | ICD-10-CM | POA: Diagnosis not present

## 2019-03-06 DIAGNOSIS — Z6841 Body Mass Index (BMI) 40.0 and over, adult: Secondary | ICD-10-CM

## 2019-03-06 DIAGNOSIS — E119 Type 2 diabetes mellitus without complications: Secondary | ICD-10-CM | POA: Diagnosis not present

## 2019-03-06 DIAGNOSIS — Z794 Long term (current) use of insulin: Secondary | ICD-10-CM

## 2019-03-06 DIAGNOSIS — E559 Vitamin D deficiency, unspecified: Secondary | ICD-10-CM | POA: Diagnosis not present

## 2019-03-06 LAB — HM DIABETES EYE EXAM

## 2019-03-06 MED ORDER — JANUMET 50-500 MG PO TABS: 1.0000 | ORAL_TABLET | Freq: Two times a day (BID) | ORAL | 0 refills | Status: DC

## 2019-03-06 MED ORDER — VITAMIN D (ERGOCALCIFEROL) 1.25 MG (50000 UNIT) PO CAPS: 50000.0000 [IU] | ORAL_CAPSULE | ORAL | 0 refills | Status: DC

## 2019-03-06 NOTE — Progress Notes (Signed)
Office: [phone]  /  Fax: [phone]   HPI:   Chief Complaint: OBESITY Katrina Montes is here to discuss her progress with her obesity treatment plan. She is on the Category 2 plan and is following her eating plan approximately 80 % of the time. She states she is exercising 0 minutes 0 times per week. Katrina Montes is doing well overall. She is doing well with protein and water. Her weight is 281 lb (127.5 kg) today and she has maintained weight over a period of 2 weeks since her last visit. She has lost 5 lbs since starting treatment with [place].  Vitamin D deficiency Katrina Montes has a diagnosis of vitamin D deficiency. She is currently taking high dose prescription vit D and denies nausea, vomiting or muscle weakness.  At risk for osteopenia and osteoporosis Katrina Montes is at higher risk of osteopenia and osteoporosis due to vitamin D deficiency.   Diabetes II Katrina Montes has a diagnosis of diabetes type II. She is currently taking Janumet and Engineer, agricultural. Katrina Montes states fasting BGs range between 180 and 220 and 2 hour post prandial BGs range between 200 and 300. Last A1c was at 11.3 She has been working on intensive lifestyle modifications including diet, exercise, and weight loss to help control her blood glucose levels.  ASSESSMENT AND PLAN:  Vitamin D deficiency - Plan: Vitamin D, Ergocalciferol, (DRISDOL) 1.25 MG (50000 UT) CAPS capsule  Type 2 diabetes mellitus without complication, with long-term current use of insulin (HCC) - Plan: JANUMET 50-500 MG tablet  At risk for osteoporosis  Class 3 severe obesity with serious comorbidity and body mass index (BMI) of 40.0 to 44.9 in adult, unspecified obesity type ([place])  PLAN:  Vitamin D Deficiency Katrina Montes was informed that low vitamin D levels contributes to fatigue and are associated with obesity, breast, and colon cancer. She agrees to continue to take prescription Vit D '@50'$ ,000 IU every week #4 with no refills and will follow up for routine testing of  vitamin D, at least 2-3 times per year. She was informed of the risk of over-replacement of vitamin D and agrees to not increase her dose unless she discusses this with [place] first. Katrina Montes agrees to follow up as directed.  At risk for osteopenia and osteoporosis Katrina Montes was given extended  (15 minutes) osteoporosis prevention counseling today. Katrina Montes is at risk for osteopenia and osteoporosis due to her vitamin D deficiency. She was encouraged to take her vitamin D and follow her higher calcium diet and increase strengthening exercise to help strengthen her bones and decrease her risk of osteopenia and osteoporosis.  Diabetes II Katrina Montes has been given extensive diabetes education by myself today including ideal fasting and post-prandial blood glucose readings, individual ideal Hgb A1c goals and hypoglycemia prevention. We discussed the importance of good blood sugar control to decrease the likelihood of diabetic complications such as nephropathy, neuropathy, limb loss, blindness, coronary artery disease, and death. We discussed the importance of intensive lifestyle modification including diet, exercise and weight loss as the first line treatment for diabetes. Katrina Montes agrees to continue Janumet 50-500 mg BID #60 with no refills. She will continue to log her blood sugars. Katrina Montes agrees to follow up with our clinic in 2 weeks.  Obesity Katrina Montes is currently in the action stage of change. As such, her goal is to continue with weight loss efforts She has agreed to follow the Category 2 plan Katrina Montes will walk for 20 minutes 2 to 3 times per week and bike for 30 minutes and alternate  with 10 to 15 minutes daily for weight loss and overall health benefits. We discussed the following Behavioral Modification Strategies today: increase H2O intake, no skipping meals, keeping healthy foods in the home, increasing lean protein intake, decreasing simple carbohydrates, increasing vegetables, decrease eating out and work on  meal planning and easy cooking plans  Katrina Montes has agreed to follow up with our clinic in 2 weeks. She was informed of the importance of frequent follow up visits to maximize her success with intensive lifestyle modifications for her multiple health conditions.  ALLERGIES: No Known Allergies  MEDICATIONS: Current Outpatient Medications on File Prior to Visit  Medication Sig Dispense Refill   ARTHROTEC 75-0.2 MG TBEC Take 1 tablet by mouth daily as needed.   3   blood glucose meter kit and supplies KIT Dispense based on patient and insurance preference. Use up to two times daily as directed. (FOR ICD-9 250.00, 250.01). 1 each 0   Blood Glucose Monitoring Suppl (BLOOD GLUCOSE METER KIT AND SUPPLIES) Dispense based on patient and insurance preference. Use to monitor FSBS 1x daily fasting. Dx: 250.00 1 each 0   Calcium-Magnesium-Zinc (CALCIUM-MAGNESUIUM-ZINC PO) Take by mouth.     cholecalciferol (VITAMIN D) 1000 UNITS tablet Take 1,000 Units by mouth 2 (two) times daily.     cyclobenzaprine (FLEXERIL) 10 MG tablet TAKE 1 Tablet BY MOUTH 4 TIMES DAILY AS NEEDED  3   DiphenhydrAMINE HCl (BENADRYL ALLERGY PO) Take by mouth as needed.     docusate sodium (COLACE) 50 MG capsule Take 1 capsule (50 mg total) by mouth 2 (two) times daily. 60 capsule 11   gabapentin (NEURONTIN) 300 MG capsule Take 1 capsule (300 mg total) by mouth 3 (three) times daily. 90 capsule 11   Garlic (GARLIQUE PO) Take 1 tablet by mouth daily.     glucose blood (ACCU-CHEK AVIVA) test strip Check BS twice daily 100 each 0   ibuprofen (ADVIL,MOTRIN) 200 MG tablet Take 200 mg by mouth every 6 (six) hours as needed.     Insulin Glargine (BASAGLAR KWIKPEN) 100 UNIT/ML SOPN INJECT 20 UNITS UNDER THE SKIN EVERY NIGHT 10 pen 0   Lancet Devices (ACCU-CHEK SOFTCLIX) lancets Monitor FSBS 1x daily fasting. Dx: 250.00 100 each 3   Lancets (ACCU-CHEK SAFE-T PRO) lancets Check BS twice daily 100 each 0    lisinopril-hydrochlorothiazide (PRINZIDE,ZESTORETIC) 20-12.5 MG tablet Take 1 tablet by mouth daily. 30 tablet 0   meclizine (ANTIVERT) 25 MG tablet Take 1 tablet (25 mg total) by mouth 3 (three) times daily as needed for dizziness. 30 tablet 6   meloxicam (MOBIC) 15 MG tablet Take 1 tablet (15 mg total) by mouth daily. 90 tablet 1   Multiple Vitamin (MULTIVITAMIN) capsule Take 1 capsule by mouth daily.     norgestimate-ethinyl estradiol (ORTHO-CYCLEN,SPRINTEC,PREVIFEM) 0.25-35 MG-MCG tablet Take 1 tablet by mouth daily.     ocrelizumab (OCREVUS) 300 MG/10ML injection Inject into the vein every 6 (six) months.     ocrelizumab 600 mg in sodium chloride 0.9 % 500 mL Inject 600 mg into the vein every 6 (six) months.     rizatriptan (MAXALT) 10 MG tablet Take 1 tablet (10 mg total) by mouth as needed for migraine. May repeat in 2 hours if needed. Max dose: 2/24 hr or 15/30 days. 45 tablet 3   topiramate (TOPAMAX) 100 MG tablet Take 1 tablet (100 mg total) by mouth 2 (two) times daily. 180 tablet 3   traZODone (DESYREL) 50 MG tablet Take 1 tablet (50 mg  total) by mouth at bedtime. 30 tablet 6   venlafaxine XR (EFFEXOR-XR) 75 MG 24 hr capsule Take 2 capsules (150 mg total) by mouth daily with breakfast. 60 capsule 11   No current facility-administered medications on file prior to visit.     PAST MEDICAL HISTORY: Past Medical History:  Diagnosis Date   Anemia    Anxiety    Back pain    Carpal tunnel syndrome    Constipation    Depression    Diabetes mellitus    Hypertension    Joint pain    Lactose intolerance    Multiple sclerosis (HCC)    Optic neuritis    Shortness of breath    Swallowing difficulty    Swelling of both lower extremities    Vision, loss, sudden    Vitamin D deficiency     PAST SURGICAL HISTORY: Past Surgical History:  Procedure Laterality Date   BREAST REDUCTION SURGERY     CYST EXCISION     ovarian cyst removed     TUBAL LIGATION      WISDOM TOOTH EXTRACTION      SOCIAL HISTORY: Social History   Tobacco Use   Smoking status: Never Smoker   Smokeless tobacco: Never Used  Substance Use Topics   Alcohol use: No   Drug use: No    FAMILY HISTORY: Family History  Problem Relation Age of Onset   Cancer Mother    Diabetes Mother    Hearing loss Mother    Thyroid nodules Mother    Anemia Mother    Kidney disease Mother    Hypertension Mother    Obesity Mother    Hyperlipidemia Father    High blood pressure Father    Sleep apnea Father    Anemia Sister    Arthritis Maternal Grandmother    Arthritis Maternal Grandfather    Arthritis Paternal Grandmother    Kidney disease Paternal Grandmother    Hypertension Paternal Grandmother    Arthritis Paternal Grandfather    Vision loss Paternal Grandfather    Stroke Paternal Grandfather    Hyperlipidemia Paternal Grandfather    Anemia Sister     ROS: Review of Systems  Constitutional: Negative for weight loss.  Gastrointestinal: Negative for nausea and vomiting.  Musculoskeletal:       Negative for muscle weakness  Endo/Heme/Allergies:       Positive for hyperglycemia    PHYSICAL EXAM: Blood pressure (!) 129/92, pulse 88, temperature 97.9 F (36.6 C), height '5\' 6"'$  (1.676 m), weight 281 lb (127.5 kg), last menstrual period 02/25/2019, SpO2 100 %. Body mass index is 45.35 kg/m. Physical Exam Vitals signs reviewed.  Constitutional:      Appearance: Normal appearance. She is well-developed. She is obese.  Cardiovascular:     Rate and Rhythm: Normal rate.  Pulmonary:     Effort: Pulmonary effort is normal.  Musculoskeletal: Normal range of motion.  Skin:    General: Skin is warm and dry.  Neurological:     Mental Status: She is alert and oriented to person, place, and time.  Psychiatric:        Mood and Affect: Mood normal.        Behavior: Behavior normal.     RECENT LABS AND TESTS: BMET    Component Value  Date/Time   NA 139 02/25/2019 0833   K 4.3 02/25/2019 0833   CL 102 02/25/2019 0833   CO2 21 02/25/2019 0833   GLUCOSE 275 (H) 02/25/2019 6010  GLUCOSE 208 (H) 11/02/2014 0944   BUN 9 02/25/2019 0833   CREATININE 0.70 02/25/2019 0833   CREATININE 0.70 11/02/2014 0944   CALCIUM 9.3 02/25/2019 0833   GFRNONAA 104 02/25/2019 0833   GFRAA 120 02/25/2019 0833   Lab Results  Component Value Date   HGBA1C 11.3 (H) 01/30/2019   HGBA1C 7.7 (H) 06/17/2015   HGBA1C 8.6 (H) 11/02/2014   HGBA1C 11.7 (H) 07/28/2014   HGBA1C 9.9 10/24/2010   No results found for: INSULIN CBC    Component Value Date/Time   WBC 4.8 02/25/2019 0833   WBC 8.6 11/02/2014 0944   RBC 4.41 02/25/2019 0833   RBC 4.37 11/02/2014 0944   HGB 11.5 02/25/2019 0833   HCT 36.3 02/25/2019 0833   PLT 343 02/25/2019 0833   MCV 82 02/25/2019 0833   MCH 26.1 (L) 02/25/2019 0833   MCH 25.4 (L) 11/02/2014 0944   MCHC 31.7 02/25/2019 0833   MCHC 31.8 11/02/2014 0944   RDW 14.9 02/25/2019 0833   LYMPHSABS 1.2 02/25/2019 0833   MONOABS 0.8 11/02/2014 0944   EOSABS 0.0 02/25/2019 0833   BASOSABS 0.0 02/25/2019 0833   Iron/TIBC/Ferritin/ %Sat    Component Value Date/Time   IRON 26 (L) 09/26/2010 1713   TIBC 429 09/26/2010 1713   FERRITIN 22 09/26/2010 1713   IRONPCTSAT 6 (L) 09/26/2010 1713   Lipid Panel     Component Value Date/Time   CHOL 208 (H) 11/02/2014 0944   TRIG 114 11/02/2014 0944   HDL 66 11/02/2014 0944   CHOLHDL 3.2 11/02/2014 0944   VLDL 23 11/02/2014 0944   LDLCALC 119 (H) 11/02/2014 0944   Hepatic Function Panel     Component Value Date/Time   PROT 7.0 02/25/2019 0833   ALBUMIN 3.9 02/25/2019 0833   AST 13 02/25/2019 0833   ALT 21 02/25/2019 0833   ALKPHOS 70 02/25/2019 0833   BILITOT 0.3 02/25/2019 0833      Component Value Date/Time   TSH 1.050 10/21/2018 1213   TSH 1.250 02/26/2018 1631   TSH 1.281 07/28/2014 1044   Results for JALEA, BRONAUGH (MRN [id number]) as of 03/06/2019  14:34  Ref. Range 01/14/2019 10:37  Vitamin D, 25-Hydroxy Latest Ref Range: 30.0 - 100.0 ng/mL 13.9 (L)     OBESITY BEHAVIORAL INTERVENTION VISIT  Today's visit was # 4   Starting weight: 276 lbs Starting date: 01/14/2019 Today's weight : 281 lbs Today's date: 03/06/2019 Total lbs lost to date: 5 At least 15 minutes were spent on discussing the following behavioral intervention visit.    03/06/2019  Height '5\' 6"'$  (1.676 m)  Weight 281 lb (127.5 kg)  BMI (Calculated) 45.38  BLOOD PRESSURE - SYSTOLIC 834  BLOOD PRESSURE - DIASTOLIC 92   Body Fat % 52 %  Total Body Water (lbs) 102.8 lbs    ASK: We discussed the diagnosis of obesity with Joline Mefferd today and Lajoyce agreed to give [place] permission to discuss obesity behavioral modification therapy today.  ASSESS: Tiwanda has the diagnosis of obesity and her BMI today is 45.38 Raechel is in the action stage of change   ADVISE: Alura was educated on the multiple health risks of obesity as well as the benefit of weight loss to improve her health. She was advised of the need for long term treatment and the importance of lifestyle modifications to improve her current health and to decrease her risk of future health problems.  AGREE: Multiple dietary modification options and treatment options were discussed and  Arleny agreed to follow the recommendations documented in the above note.  ARRANGE: Keelan was educated on the importance of frequent visits to treat obesity as outlined per CMS and USPSTF guidelines and agreed to schedule her next follow up appointment today.  Corey Skains, am acting as Location manager for [employer]. Owens Shark, DO  I have reviewed the above documentation for accuracy and completeness, and I agree with the above. -Jearld Lesch, DO

## 2019-03-11 ENCOUNTER — Encounter (INDEPENDENT_AMBULATORY_CARE_PROVIDER_SITE_OTHER): Payer: Self-pay

## 2019-03-12 ENCOUNTER — Encounter (INDEPENDENT_AMBULATORY_CARE_PROVIDER_SITE_OTHER): Payer: Self-pay | Admitting: Bariatrics

## 2019-03-13 ENCOUNTER — Other Ambulatory Visit (INDEPENDENT_AMBULATORY_CARE_PROVIDER_SITE_OTHER): Payer: Self-pay

## 2019-03-13 DIAGNOSIS — E559 Vitamin D deficiency, unspecified: Secondary | ICD-10-CM

## 2019-03-13 DIAGNOSIS — E119 Type 2 diabetes mellitus without complications: Secondary | ICD-10-CM

## 2019-03-13 DIAGNOSIS — Z794 Long term (current) use of insulin: Principal | ICD-10-CM

## 2019-03-13 MED ORDER — JANUMET 50-500 MG PO TABS: 1.0000 | ORAL_TABLET | Freq: Two times a day (BID) | ORAL | 0 refills | Status: DC

## 2019-03-13 MED ORDER — VITAMIN D (ERGOCALCIFEROL) 1.25 MG (50000 UNIT) PO CAPS: 50000.0000 [IU] | ORAL_CAPSULE | ORAL | 0 refills | Status: DC

## 2019-03-14 ENCOUNTER — Other Ambulatory Visit: Payer: Self-pay

## 2019-03-14 ENCOUNTER — Encounter: Payer: Self-pay | Admitting: Family Medicine

## 2019-03-14 ENCOUNTER — Ambulatory Visit (INDEPENDENT_AMBULATORY_CARE_PROVIDER_SITE_OTHER): Payer: Medicare Other | Admitting: Family Medicine

## 2019-03-14 VITALS — BP 128/74 | HR 68 | Temp 98.1°F | Resp 14 | Ht 66.0 in | Wt 280.0 lb

## 2019-03-14 DIAGNOSIS — I10 Essential (primary) hypertension: Secondary | ICD-10-CM

## 2019-03-14 DIAGNOSIS — E1165 Type 2 diabetes mellitus with hyperglycemia: Secondary | ICD-10-CM | POA: Diagnosis not present

## 2019-03-14 DIAGNOSIS — G35 Multiple sclerosis: Secondary | ICD-10-CM | POA: Diagnosis not present

## 2019-03-14 DIAGNOSIS — Z794 Long term (current) use of insulin: Secondary | ICD-10-CM | POA: Diagnosis not present

## 2019-03-14 DIAGNOSIS — G43009 Migraine without aura, not intractable, without status migrainosus: Secondary | ICD-10-CM

## 2019-03-14 DIAGNOSIS — E559 Vitamin D deficiency, unspecified: Secondary | ICD-10-CM | POA: Diagnosis not present

## 2019-03-14 DIAGNOSIS — E119 Type 2 diabetes mellitus without complications: Secondary | ICD-10-CM

## 2019-03-14 DIAGNOSIS — F3289 Other specified depressive episodes: Secondary | ICD-10-CM | POA: Diagnosis not present

## 2019-03-14 DIAGNOSIS — Z23 Encounter for immunization: Secondary | ICD-10-CM

## 2019-03-14 MED ORDER — ACCU-CHEK SAFE-T PRO LANCETS MISC: 0 refills | Status: DC

## 2019-03-14 MED ORDER — BASAGLAR KWIKPEN 100 UNIT/ML ~~LOC~~ SOPN: PEN_INJECTOR | SUBCUTANEOUS | 0 refills | Status: DC

## 2019-03-14 MED ORDER — NORGESTIMATE-ETH ESTRADIOL 0.25-35 MG-MCG PO TABS: 1.0000 | ORAL_TABLET | Freq: Every day | ORAL | 11 refills | Status: DC

## 2019-03-14 MED ORDER — LISINOPRIL-HYDROCHLOROTHIAZIDE 20-12.5 MG PO TABS: 1.0000 | ORAL_TABLET | Freq: Every day | ORAL | 0 refills | Status: DC

## 2019-03-14 NOTE — Patient Instructions (Addendum)
Increeaae the basaglar to 22 units  Continue Janumet  Use topamax once a day  Schedule mammogram in the summer  FLu shot  Med Assist program- send me forms [phone]) F/U 2 months  F/U 2 weeks Telephone visit

## 2019-03-14 NOTE — Progress Notes (Signed)
Subjective:    Patient ID: Katrina Montes, female    DOB: [birth year], 48 y.o.   MRN: [id number]  Patient presents for Re-Establish Care (is fasting)   Pt here to re-establish care     Also followed [facility] after she was referred by her neurologist due to obesity and diabetes.     [facility]- has been her previous PCP last seen in August 2019. Now has medicare but does not have prescription drug plan so will be looking into Med Assist      GAD/MDD- taking effexor which is prescribed from by neurology currently , trazodone prn    Migraine- does not take topamax twice a day, takes randomly, takes maxalt as needed    Concern sleep apnea- daytime fatigue and snoring, more than MS symptoms,   has family history of OSA- father   Had visit with Dr. Frances Furbish- awaiting insurance to approve sleep study   MS- treated by Dr. Terrace Arabia  takes flexeril as needed, on Ocrevus injection every 6 months    - Dr. Charise Killian - [facility]    Early Glaucoma - on drops   - now has glasses/bifocals    Immunizations- Due for flu shot    Due for Mammogram-  PAP Smear UTD     Constipaton- uses colace    Gabapentin given, for back and hip pain, has to take three times a day for pain relief    Vitamin D def noted   Has polyarthralgia, using mobic as needed    Vitamin D- recently started on 50,000IU once a week    DM-  Basaglar 20 units at bedtime, Janumet BID, was off insulin for a while , cbg 169-231 fasting since 3/19 when she restarted her meds refilled by the wellness clinic     CBC/CMET, unremarkable except glucose on 3/2     Walks with cane difficulty with steps  Has Handicap sticker  Gets recurrent optic neuritis as apart of her MS      Review Of Systems:  GEN- denies fatigue, fever, weight loss,weakness, recent illness HEENT- denies eye drainage, change in vision, nasal discharge, CVS- denies chest pain, palpitations RESP- denies SOB, cough,  wheeze ABD- denies N/V, change in stools, abd pain GU- denies dysuria, hematuria, dribbling, incontinence MSK- + joint pain, muscle aches, injury Neuro- denies headache, dizziness, syncope, seizure activity       Objective:    BP 128/74   Pulse 68   Temp 98.1 F (36.7 C) (Oral)   Resp 14   Ht 5\' 6"  (1.676 m)   Wt 280 lb (127 kg)   LMP 03/12/2019 Comment: irregular  SpO2 98%   BMI 45.19 kg/m  GEN- NAD, alert and oriented x3 HEENT- PERRL, EOMI, non injected sclera, pink conjunctiva, MMM, oropharynx clear Neck- Supple, no thyromegaly CVS- RRR, no murmur RESP-CTAB ABD-NABS,soft,NT,ND Psych- normal affect and mood EXT- No edema Pulses- Radial, DP- 2+        Assessment & Plan:      Problem List Items Addressed This Visit      Unprioritized   Depression    Continue effexor, prn trazodone      Diabetes mellitus type II, uncontrolled (HCC)    Increase basaglar to 22units Continue janumet Goal fasting < 120 Recheck renal function Plan to check A1C at next visit  Phone visit in 2 weeks to f/u CBG      Relevant Medications   Insulin Glargine (BASAGLAR KWIKPEN)  100 UNIT/ML SOPN   lisinopril-hydrochlorothiazide (PRINZIDE,ZESTORETIC) 20-12.5 MG tablet   Essential hypertension    Controlled no changes       Relevant Medications   lisinopril-hydrochlorothiazide (PRINZIDE,ZESTORETIC) 20-12.5 MG tablet   Migraine without aura and without status migrainosus, not intractable    Advised to take topamax at least once a day, will also assist with weight loss      Relevant Medications   lisinopril-hydrochlorothiazide (PRINZIDE,ZESTORETIC) 20-12.5 MG tablet   Multiple sclerosis (HCC)    Followed by neurology  Requires cane at time opthalmology also following      Vitamin D deficiency - Primary    Other Visit Diagnoses    Type 2 diabetes mellitus without complication, without long-term current use of insulin (HCC)       Relevant Medications   Lancets (ACCU-CHEK  SAFE-T PRO) lancets   Insulin Glargine (BASAGLAR KWIKPEN) 100 UNIT/ML SOPN   lisinopril-hydrochlorothiazide (PRINZIDE,ZESTORETIC) 20-12.5 MG tablet   Other Relevant Orders   Lipid panel (Completed)   Type 2 diabetes mellitus without complication, with long-term current use of insulin (HCC)       Relevant Medications   Insulin Glargine (BASAGLAR KWIKPEN) 100 UNIT/ML SOPN   lisinopril-hydrochlorothiazide (PRINZIDE,ZESTORETIC) 20-12.5 MG tablet      Note: This dictation was prepared with Dragon dictation along with smaller Lobbyist. Any transcriptional errors that result from this process are unintentional.

## 2019-03-15 ENCOUNTER — Encounter: Payer: Self-pay | Admitting: Family Medicine

## 2019-03-15 LAB — LIPID PANEL
Cholesterol: 254 mg/dL — ABNORMAL HIGH (ref <200)
HDL: 68 mg/dL (ref >=50)
LDL Cholesterol (Calc): 161 mg/dL (calc) — ABNORMAL HIGH
Non-HDL Cholesterol (Calc): 186 mg/dL (calc) — ABNORMAL HIGH (ref <130)
Total CHOL/HDL Ratio: 3.7 (calc) (ref <5.0)
Triglycerides: 124 mg/dL (ref <150)

## 2019-03-15 NOTE — Assessment & Plan Note (Signed)
Continue effexor, prn trazodone

## 2019-03-15 NOTE — Assessment & Plan Note (Signed)
Controlled no changes 

## 2019-03-15 NOTE — Assessment & Plan Note (Addendum)
Increase basaglar to 22units Continue janumet Goal fasting < 120 Recheck renal function Plan to check A1C at next visit  Phone visit in 2 weeks to f/u CBG

## 2019-03-15 NOTE — Assessment & Plan Note (Signed)
Followed by neurology  Requires cane at time opthalmology also following

## 2019-03-15 NOTE — Assessment & Plan Note (Signed)
Advised to take topamax at least once a day, will also assist with weight loss

## 2019-03-15 NOTE — Assessment & Plan Note (Signed)
Working on dietary changes, cutting out carbs

## 2019-03-21 ENCOUNTER — Other Ambulatory Visit (INDEPENDENT_AMBULATORY_CARE_PROVIDER_SITE_OTHER): Payer: Self-pay | Admitting: Bariatrics

## 2019-03-21 DIAGNOSIS — E119 Type 2 diabetes mellitus without complications: Secondary | ICD-10-CM

## 2019-03-24 ENCOUNTER — Other Ambulatory Visit: Payer: Self-pay

## 2019-03-24 ENCOUNTER — Encounter (INDEPENDENT_AMBULATORY_CARE_PROVIDER_SITE_OTHER): Payer: Self-pay | Admitting: Bariatrics

## 2019-03-24 ENCOUNTER — Ambulatory Visit (INDEPENDENT_AMBULATORY_CARE_PROVIDER_SITE_OTHER): Payer: Medicare Other | Admitting: Bariatrics

## 2019-03-24 DIAGNOSIS — E119 Type 2 diabetes mellitus without complications: Secondary | ICD-10-CM | POA: Diagnosis not present

## 2019-03-24 DIAGNOSIS — Z6841 Body Mass Index (BMI) 40.0 and over, adult: Secondary | ICD-10-CM

## 2019-03-24 DIAGNOSIS — Z794 Long term (current) use of insulin: Secondary | ICD-10-CM | POA: Diagnosis not present

## 2019-03-24 DIAGNOSIS — E66813 Obesity, class 3: Secondary | ICD-10-CM

## 2019-03-24 DIAGNOSIS — E559 Vitamin D deficiency, unspecified: Secondary | ICD-10-CM

## 2019-03-25 DIAGNOSIS — Z6841 Body Mass Index (BMI) 40.0 and over, adult: Secondary | ICD-10-CM

## 2019-03-25 NOTE — Progress Notes (Signed)
Office: [phone]  /  Fax: [phone] TeleHealth Visit:  Katrina Montes has verbally consented to this TeleHealth visit today. The patient is located at home, the provider is located at the [employer] [facility] office. The participants in this visit include the listed provider and patient and any and all parties involved. The visit was conducted today via telephone. Katrina Montes was unable to use realtime audiovisual technology today and the telehealth visit was conducted via telephone.   HPI:   Chief Complaint: OBESITY Katrina Montes is here to discuss her progress with her obesity treatment plan. She is on the Category 2 plan and is following her eating plan approximately 80 % of the time. She states she is riding the stationary bike for 30 minutes 4 times per week. Katrina Montes has lost 3 pounds (per home scale) and has been consistent in the past. She is cooking more. Katrina Montes has been able to get the food that she needs. She is not doing stress eating (more control). We were unable to weigh the patient today for this TeleHealth visit. She feels as if she has lost weight since her last visit. She has lost 0 lbs since starting treatment with [place].  Vitamin D deficiency Katrina Montes has a diagnosis of vitamin D deficiency. She is taking high dose vit D and denies nausea, vomiting or muscle weakness.  Diabetes II Katrina Montes has a diagnosis of diabetes type II. She is taking Janumet and Basaglar 22 units. Katrina Montes states fasting BGs range between 160 and 180's and 2 hour post prandial BGs are lower in the 180's. Katrina Montes denies polyphagia. Last A1c was at 11.3 She has been working on intensive lifestyle modifications including diet, exercise, and weight loss to help control her blood glucose levels.  ASSESSMENT AND PLAN:  Vitamin D deficiency  Type 2 diabetes mellitus without complication, with long-term current use of insulin (HCC)  Class 3 severe obesity with serious comorbidity and body mass index (BMI) of 40.0  to 44.9 in adult, unspecified obesity type ([place])  PLAN:  Vitamin D Deficiency Katrina Montes was informed that low vitamin D levels contributes to fatigue and are associated with obesity, breast, and colon cancer. She agrees to continue to take prescription Vit D _0 ,000 IU every week and will follow up for routine testing of vitamin D, at least 2-3 times per year. She was informed of the risk of over-replacement of vitamin D and agrees to not increase her dose unless she discusses this with [place] first.  Diabetes II Katrina Montes has been given extensive diabetes education by myself today including ideal fasting and post-prandial blood glucose readings, individual ideal Hgb A1c goals and hypoglycemia prevention. We discussed the importance of good blood sugar control to decrease the likelihood of diabetic complications such as nephropathy, neuropathy, limb loss, blindness, coronary artery disease, and death. We discussed the importance of intensive lifestyle modification including diet, exercise and weight loss as the first line treatment for diabetes. Roselene agrees to continue her diabetes medications and will follow up at the agreed upon time.  Obesity Katrina Montes is currently in the action stage of change. As such, her goal is to continue with weight loss efforts She has agreed to follow the Category 2 plan Katrina Montes will continue her current exercise regimen (stationary bike) for weight loss and overall health benefits. We discussed the following Behavioral Modification Strategies today: increase H2O intake, no skipping meals, keeping healthy foods in the home, increasing lean protein intake, decreasing simple carbohydrates, increasing vegetables, decrease eating out and work  on meal planning and easy cooking plans Katrina Montes will weigh herself at home before each visit.  Katrina Montes has agreed to follow up with our clinic in 2 weeks. She was informed of the importance of frequent follow up visits to maximize her success with  intensive lifestyle modifications for her multiple health conditions.  ALLERGIES: No Known Allergies  MEDICATIONS: Current Outpatient Medications on File Prior to Visit  Medication Sig Dispense Refill  . blood glucose meter kit and supplies KIT Dispense based on patient and insurance preference. Use up to two times daily as directed. (FOR ICD-9 250.00, 250.01). 1 each 0  . cyclobenzaprine (FLEXERIL) 10 MG tablet TAKE 1 Tablet BY MOUTH 4 TIMES DAILY AS NEEDED  3  . DiphenhydrAMINE HCl (BENADRYL ALLERGY PO) Take by mouth as needed.    . docusate sodium (COLACE) 50 MG capsule Take 1 capsule (50 mg total) by mouth 2 (two) times daily. 60 capsule 11  . gabapentin (NEURONTIN) 300 MG capsule Take 1 capsule (300 mg total) by mouth 3 (three) times daily. 90 capsule 11  . glucose blood (ACCU-CHEK AVIVA) test strip Check BS twice daily 100 each 0  . ibuprofen (ADVIL,MOTRIN) 200 MG tablet Take 200 mg by mouth every 6 (six) hours as needed.    . Insulin Glargine (BASAGLAR KWIKPEN) 100 UNIT/ML SOPN INJECT 20 UNITS UNDER THE SKIN EVERY NIGHT 10 pen 0  . JANUMET 50-500 MG tablet Take 1 tablet by mouth 2 (two) times daily. 60 tablet 0  . Lancet Devices (ACCU-CHEK SOFTCLIX) lancets Monitor FSBS 1x daily fasting. Dx: 250.00 100 each 3  . Lancets (ACCU-CHEK SAFE-T PRO) lancets Check BS twice daily 100 each 0  . lisinopril-hydrochlorothiazide (PRINZIDE,ZESTORETIC) 20-12.5 MG tablet Take 1 tablet by mouth daily. 30 tablet 0  . meclizine (ANTIVERT) 25 MG tablet Take 1 tablet (25 mg total) by mouth 3 (three) times daily as needed for dizziness. 30 tablet 6  . meloxicam (MOBIC) 15 MG tablet Take 1 tablet (15 mg total) by mouth daily. 90 tablet 1  . Multiple Vitamin (MULTIVITAMIN) capsule Take 1 capsule by mouth daily.    . norgestimate-ethinyl estradiol (ORTHO-CYCLEN,SPRINTEC,PREVIFEM) 0.25-35 MG-MCG tablet Take 1 tablet by mouth daily. 1 Package 11  . ocrelizumab (OCREVUS) 300 MG/10ML injection Inject into the vein  every 6 (six) months.    Marland Kitchen ocrelizumab 600 mg in sodium chloride 0.9 % 500 mL Inject 600 mg into the vein every 6 (six) months.    . rizatriptan (MAXALT) 10 MG tablet Take 1 tablet (10 mg total) by mouth as needed for migraine. May repeat in 2 hours if needed. Max dose: 2/24 hr or 15/30 days. 45 tablet 3  . topiramate (TOPAMAX) 100 MG tablet Take 1 tablet (100 mg total) by mouth 2 (two) times daily. 180 tablet 3  . traZODone (DESYREL) 50 MG tablet Take 1 tablet (50 mg total) by mouth at bedtime. 30 tablet 6  . venlafaxine XR (EFFEXOR-XR) 75 MG 24 hr capsule Take 2 capsules (150 mg total) by mouth daily with breakfast. 60 capsule 11  . Vitamin D, Ergocalciferol, (DRISDOL) 1.25 MG (50000 UT) CAPS capsule Take 1 capsule (50,000 Units total) by mouth every 7 (seven) days. 4 capsule 0   No current facility-administered medications on file prior to visit.     PAST MEDICAL HISTORY: Past Medical History:  Diagnosis Date  . Anemia   . Anxiety   . Back pain   . Carpal tunnel syndrome   . Constipation   . Depression   .  Diabetes mellitus   . Hypertension   . Joint pain   . Lactose intolerance   . Multiple sclerosis ([place])   . Optic neuritis   . Shortness of breath   . Swallowing difficulty   . Swelling of both lower extremities   . Vision, loss, sudden   . Vitamin D deficiency     PAST SURGICAL HISTORY: Past Surgical History:  Procedure Laterality Date  . BREAST REDUCTION SURGERY    . CYST EXCISION    . ovarian cyst removed    . TUBAL LIGATION    . WISDOM TOOTH EXTRACTION      SOCIAL HISTORY: Social History   Tobacco Use  . Smoking status: Never Smoker  . Smokeless tobacco: Never Used  Substance Use Topics  . Alcohol use: No  . Drug use: No    FAMILY HISTORY: Family History  Problem Relation Age of Onset  . Cancer Mother   . Diabetes Mother   . Hearing loss Mother   . Thyroid nodules Mother   . Anemia Mother   . Kidney disease Mother   . Hypertension Mother   .  Obesity Mother   . Hyperlipidemia Father   . High blood pressure Father   . Sleep apnea Father   . Anemia Sister   . Arthritis Maternal Grandmother   . Arthritis Maternal Grandfather   . Arthritis Paternal Grandmother   . Kidney disease Paternal Grandmother   . Hypertension Paternal Grandmother   . Arthritis Paternal Grandfather   . Vision loss Paternal Grandfather   . Stroke Paternal Grandfather   . Hyperlipidemia Paternal Grandfather   . Anemia Sister     ROS: Review of Systems  Constitutional: Positive for weight loss.  Gastrointestinal: Negative for nausea and vomiting.  Musculoskeletal:       Negative for muscle weakness  Endo/Heme/Allergies:       Negative for polyphagia    PHYSICAL EXAM: Pt in no acute distress  RECENT LABS AND TESTS: BMET    Component Value Date/Time   NA 139 02/25/2019 0833   K 4.3 02/25/2019 0833   CL 102 02/25/2019 0833   CO2 21 02/25/2019 0833   GLUCOSE 275 (H) 02/25/2019 0833   GLUCOSE 208 (H) 11/02/2014 0944   BUN 9 02/25/2019 0833   CREATININE 0.70 02/25/2019 0833   CREATININE 0.70 11/02/2014 0944   CALCIUM 9.3 02/25/2019 0833   GFRNONAA 104 02/25/2019 0833   GFRAA 120 02/25/2019 0833   Lab Results  Component Value Date   HGBA1C 11.3 (H) 01/30/2019   HGBA1C 7.7 (H) 06/17/2015   HGBA1C 8.6 (H) 11/02/2014   HGBA1C 11.7 (H) 07/28/2014   HGBA1C 9.9 10/24/2010   No results found for: INSULIN CBC    Component Value Date/Time   WBC 4.8 02/25/2019 0833   WBC 8.6 11/02/2014 0944   RBC 4.41 02/25/2019 0833   RBC 4.37 11/02/2014 0944   HGB 11.5 02/25/2019 0833   HCT 36.3 02/25/2019 0833   PLT 343 02/25/2019 0833   MCV 82 02/25/2019 0833   MCH 26.1 (L) 02/25/2019 0833   MCH 25.4 (L) 11/02/2014 0944   MCHC 31.7 02/25/2019 0833   MCHC 31.8 11/02/2014 0944   RDW 14.9 02/25/2019 0833   LYMPHSABS 1.2 02/25/2019 0833   MONOABS 0.8 11/02/2014 0944   EOSABS 0.0 02/25/2019 0833   BASOSABS 0.0 02/25/2019 0833   Iron/TIBC/Ferritin/  %Sat    Component Value Date/Time   IRON 26 (L) 09/26/2010 1713   TIBC 429 09/26/2010  1713   FERRITIN 22 09/26/2010 1713   IRONPCTSAT 6 (L) 09/26/2010 1713   Lipid Panel     Component Value Date/Time   CHOL 254 (H) 03/14/2019 1258   TRIG 124 03/14/2019 1258   HDL 68 03/14/2019 1258   CHOLHDL 3.7 03/14/2019 1258   VLDL 23 11/02/2014 0944   LDLCALC 161 (H) 03/14/2019 1258   Hepatic Function Panel     Component Value Date/Time   PROT 7.0 02/25/2019 0833   ALBUMIN 3.9 02/25/2019 0833   AST 13 02/25/2019 0833   ALT 21 02/25/2019 0833   ALKPHOS 70 02/25/2019 0833   BILITOT 0.3 02/25/2019 0833      Component Value Date/Time   TSH 1.050 10/21/2018 1213   TSH 1.250 02/26/2018 1631   TSH 1.281 07/28/2014 1044     Ref. Range 01/14/2019 10:37  Vitamin D, 25-Hydroxy Latest Ref Range: 30.0 - 100.0 ng/mL 13.9 (L)    I, Doreene Nest, am acting as Location manager for [employer]. Owens Shark, DO  I have reviewed the above documentation for accuracy and completeness, and I agree with the above. -Jearld Lesch, DO

## 2019-03-31 ENCOUNTER — Other Ambulatory Visit: Payer: Self-pay

## 2019-03-31 ENCOUNTER — Encounter: Payer: Self-pay | Admitting: Family Medicine

## 2019-03-31 ENCOUNTER — Ambulatory Visit (INDEPENDENT_AMBULATORY_CARE_PROVIDER_SITE_OTHER): Payer: Medicare Other | Admitting: Family Medicine

## 2019-03-31 DIAGNOSIS — R269 Unspecified abnormalities of gait and mobility: Secondary | ICD-10-CM

## 2019-03-31 DIAGNOSIS — G43009 Migraine without aura, not intractable, without status migrainosus: Secondary | ICD-10-CM | POA: Diagnosis not present

## 2019-03-31 DIAGNOSIS — E1165 Type 2 diabetes mellitus with hyperglycemia: Secondary | ICD-10-CM

## 2019-03-31 MED ORDER — LANCETS MISC: 3 refills | Status: DC

## 2019-03-31 MED ORDER — ACCU-CHEK SAFE-T PRO LANCETS MISC: 3 refills | Status: DC

## 2019-03-31 MED ORDER — BLOOD GLUCOSE SYSTEM PAK KIT: PACK | 3 refills | Status: DC

## 2019-03-31 NOTE — Progress Notes (Signed)
Virtual Visit via Telephone Note  I connected with Katrina Montes on 03/31/19 at 12:00 PM EDT by telephone and verified that I am speaking with the correct person using two identifiers.  Pt location: at home Physician location: in office, [facility], Kingsley Spittle [place]     I discussed the limitations, risks, security and privacy concerns of performing an evaluation and management service by telephone and the availability of in person appointments. I also discussed with the patient that there may be a patient responsible charge related to this service. The patient expressed understanding and agreed to proceed.   History of Present Illness:  DM-recent a1c 11.3%  Janumet and Basaglar 22 units , fasting this 169, last pm 163,   , fasting blood sugars 4/11 164, pm 152 , 4/12 196 fasting ,  Bedtime 163   Weight 274.2lbs  Needs Lancets    She has been trying to stay active, stationary bike, walking on property     On Category 2 plan with wellness clinic-eating more protein, eating three times a day, avoiding snack   Migraines- taking topmax at bedtime, she has had improvement in number of headaches each week  does not feel tired during the day.    Observations/Objective: TELEPHONE VISIT  Assessment and Plan: DM- much improved, fasting blood sugars are below 170 or below, evening blood sugar below 180 post prandial on average  continue Janumet and The basaglar 22 units at this time   HTN-controlled    Obesity- improved weight loss, daily exercise, watching her carbs and sugars, continue with wellness plan from her weight loss physician   Migraines- improved with topamax , no changes   F/U in office end of May for OV and fasting labs   Follow Up Instructions:    I discussed the assessment and treatment plan with the patient. The patient was provided an opportunity to ask questions and all were answered. The patient agreed with the plan and demonstrated an  understanding of the instructions.   The patient was advised to call back or seek an in-person evaluation if the symptoms worsen or if the condition fails to improve as anticipated.  I provided 13  minutes of non-face-to-face time during this encounter.  End time 12:13pm,   Milinda Antis, MD

## 2019-04-05 ENCOUNTER — Other Ambulatory Visit (INDEPENDENT_AMBULATORY_CARE_PROVIDER_SITE_OTHER): Payer: Self-pay | Admitting: Bariatrics

## 2019-04-05 DIAGNOSIS — E119 Type 2 diabetes mellitus without complications: Secondary | ICD-10-CM

## 2019-04-05 DIAGNOSIS — Z794 Long term (current) use of insulin: Secondary | ICD-10-CM

## 2019-04-05 DIAGNOSIS — I10 Essential (primary) hypertension: Secondary | ICD-10-CM

## 2019-04-07 ENCOUNTER — Other Ambulatory Visit: Payer: Self-pay

## 2019-04-07 ENCOUNTER — Encounter (INDEPENDENT_AMBULATORY_CARE_PROVIDER_SITE_OTHER): Payer: Self-pay | Admitting: Bariatrics

## 2019-04-07 ENCOUNTER — Ambulatory Visit (INDEPENDENT_AMBULATORY_CARE_PROVIDER_SITE_OTHER): Payer: Medicare Other | Admitting: Bariatrics

## 2019-04-07 DIAGNOSIS — E559 Vitamin D deficiency, unspecified: Secondary | ICD-10-CM

## 2019-04-07 DIAGNOSIS — E1165 Type 2 diabetes mellitus with hyperglycemia: Secondary | ICD-10-CM

## 2019-04-07 DIAGNOSIS — Z6841 Body Mass Index (BMI) 40.0 and over, adult: Secondary | ICD-10-CM

## 2019-04-07 DIAGNOSIS — Z794 Long term (current) use of insulin: Secondary | ICD-10-CM

## 2019-04-08 ENCOUNTER — Ambulatory Visit: Payer: Medicare Other | Admitting: Neurology

## 2019-04-09 NOTE — Progress Notes (Signed)
Office: [phone]  /  Fax: [phone] TeleHealth Visit:  Katrina Montes has verbally consented to this TeleHealth visit today. The patient is located at home, the provider is located at the [employer] [facility] office. The participants in this visit include the listed provider and patient and any and all parties involved. The visit was conducted today via telephone. Katrina Montes was unable to use realtime audiovisual technology today and the telehealth visit was conducted via telephone.  HPI:   Chief Complaint: OBESITY Katrina Montes is here to discuss her progress with her obesity treatment plan. She is on the Category 2 plan and is following her eating plan approximately 70 % of the time. She states she is walking for 30 minutes 7 days per week, stretching for 15 minutes 7 days per week and riding the stationary bike for 15 minutes 4 days per week. Katrina Montes states that her weight has stayed the same. We were unable to weigh the patient today for this TeleHealth visit. She feels as if she has maintained weight since her last visit. She has lost 0 lbs since starting treatment with [place].  Diabetes II with hyperglycemia Katrina Montes has a diagnosis of diabetes type II. She is taking Basaglar 22 units and Janumet. Katrina Montes states fasting BGs range between 120 and 140's and is occasionally higher and she is not checking 2 hour post prandial BGs. Katrina Montes denies any hypoglycemic episodes. Last A1c was at 11.3 She has been working on intensive lifestyle modifications including diet, exercise, and weight loss to help control her blood glucose levels.  Vitamin D deficiency Katrina Montes has a diagnosis of vitamin D deficiency. Her last vitamin D level was at 13.9 She is currently taking vit D and denies nausea, vomiting or muscle weakness.  ASSESSMENT AND PLAN:  Type 2 diabetes mellitus with hyperglycemia, with long-term current use of insulin (HCC)  Vitamin D deficiency  Class 3 severe obesity with serious comorbidity  and body mass index (BMI) of 45.0 to 49.9 in adult, unspecified obesity type (Katrina Montes)  PLAN:  Diabetes II with hyperglycemia Katrina Montes has been given extensive diabetes education by myself today including ideal fasting and post-prandial blood glucose readings, individual ideal Hgb A1c goals and hypoglycemia prevention. We discussed the importance of good blood sugar control to decrease the likelihood of diabetic complications such as nephropathy, neuropathy, limb loss, blindness, coronary artery disease, and death. We discussed the importance of intensive lifestyle modification including diet, exercise and weight loss as the first line treatment for diabetes. Katrina Montes agrees to continue her diabetes medications and will follow up at the agreed upon time.  Vitamin D Deficiency Katrina Montes was informed that low vitamin D levels contributes to fatigue and are associated with obesity, breast, and colon cancer. She will continue to take prescription Vit D '@50'$ ,000 IU every week and will follow up for routine testing of vitamin D, at least 2-3 times per year. She was informed of the risk of over-replacement of vitamin D and agrees to not increase her dose unless she discusses this with [place] first.  Obesity Katrina Montes is currently in the action stage of change. As such, her goal is to continue with weight loss efforts She has agreed to portion control better and make smarter food choices, such as increase vegetables and decrease simple carbohydrates  Katrina Montes will continue exercise for weight loss and overall health benefits. We discussed the following Behavioral Modification Strategies today: increase H2O intake, no skipping meals, keeping healthy foods in the home, increasing lean protein intake, decreasing  simple carbohydrates, increasing vegetables, decrease eating out and work on meal planning and easy cooking plans Katrina Montes will weigh herself at home before each visit. She will try "MyFitnessPal".  Katrina Montes has agreed to  follow up with our clinic in 2 weeks. She was informed of the importance of frequent follow up visits to maximize her success with intensive lifestyle modifications for her multiple health conditions.  ALLERGIES: No Known Allergies  MEDICATIONS: Current Outpatient Medications on File Prior to Visit  Medication Sig Dispense Refill  . blood glucose meter kit and supplies KIT Dispense based on patient and insurance preference. Use up to two times daily as directed. (FOR ICD-9 250.00, 250.01). 1 each 0  . Blood Glucose Monitoring Suppl (BLOOD GLUCOSE SYSTEM PAK) KIT Please dispense based on patient and insurance preference. Use as directed to monitor FSBS 3x daily. Dx: E11.9. 100 each 3  . cyclobenzaprine (FLEXERIL) 10 MG tablet TAKE 1 Tablet BY MOUTH 4 TIMES DAILY AS NEEDED  3  . DiphenhydrAMINE HCl (BENADRYL ALLERGY PO) Take by mouth as needed.    . docusate sodium (COLACE) 50 MG capsule Take 1 capsule (50 mg total) by mouth 2 (two) times daily. 60 capsule 11  . gabapentin (NEURONTIN) 300 MG capsule Take 1 capsule (300 mg total) by mouth 3 (three) times daily. 90 capsule 11  . ibuprofen (ADVIL,MOTRIN) 200 MG tablet Take 200 mg by mouth every 6 (six) hours as needed.    . Insulin Glargine (BASAGLAR KWIKPEN) 100 UNIT/ML SOPN INJECT 20 UNITS UNDER THE SKIN EVERY NIGHT 10 pen 0  . JANUMET 50-500 MG tablet Take 1 tablet by mouth 2 (two) times daily. 60 tablet 0  . Lancets MISC Please dispense based on patient and insurance preference. Use as directed to monitor FSBS 3x daily. Dx: E11.9. 100 each 3  . lisinopril-hydrochlorothiazide (PRINZIDE,ZESTORETIC) 20-12.5 MG tablet Take 1 tablet by mouth daily. 30 tablet 0  . meclizine (ANTIVERT) 25 MG tablet Take 1 tablet (25 mg total) by mouth 3 (three) times daily as needed for dizziness. 30 tablet 6  . meloxicam (MOBIC) 15 MG tablet Take 1 tablet (15 mg total) by mouth daily. 90 tablet 1  . Multiple Vitamin (MULTIVITAMIN) capsule Take 1 capsule by mouth daily.     . norgestimate-ethinyl estradiol (ORTHO-CYCLEN,SPRINTEC,PREVIFEM) 0.25-35 MG-MCG tablet Take 1 tablet by mouth daily. 1 Package 11  . ocrelizumab (OCREVUS) 300 MG/10ML injection Inject into the vein every 6 (six) months.    Marland Kitchen ocrelizumab 600 mg in sodium chloride 0.9 % 500 mL Inject 600 mg into the vein every 6 (six) months.    . rizatriptan (MAXALT) 10 MG tablet Take 1 tablet (10 mg total) by mouth as needed for migraine. May repeat in 2 hours if needed. Max dose: 2/24 hr or 15/30 days. 45 tablet 3  . topiramate (TOPAMAX) 100 MG tablet Take 1 tablet (100 mg total) by mouth 2 (two) times daily. 180 tablet 3  . traZODone (DESYREL) 50 MG tablet Take 1 tablet (50 mg total) by mouth at bedtime. 30 tablet 6  . venlafaxine XR (EFFEXOR-XR) 75 MG 24 hr capsule Take 2 capsules (150 mg total) by mouth daily with breakfast. 60 capsule 11  . Vitamin D, Ergocalciferol, (DRISDOL) 1.25 MG (50000 UT) CAPS capsule Take 1 capsule (50,000 Units total) by mouth every 7 (seven) days. 4 capsule 0   No current facility-administered medications on file prior to visit.     PAST MEDICAL HISTORY: Past Medical History:  Diagnosis Date  .  Anemia   . Anxiety   . Back pain   . Carpal tunnel syndrome   . Constipation   . Depression   . Diabetes mellitus   . Hypertension   . Joint pain   . Lactose intolerance   . Multiple sclerosis ([place])   . Optic neuritis   . Shortness of breath   . Swallowing difficulty   . Swelling of both lower extremities   . Vision, loss, sudden   . Vitamin D deficiency     PAST SURGICAL HISTORY: Past Surgical History:  Procedure Laterality Date  . BREAST REDUCTION SURGERY    . CYST EXCISION    . ovarian cyst removed    . TUBAL LIGATION    . WISDOM TOOTH EXTRACTION      SOCIAL HISTORY: Social History   Tobacco Use  . Smoking status: Never Smoker  . Smokeless tobacco: Never Used  Substance Use Topics  . Alcohol use: No  . Drug use: No    FAMILY HISTORY: Family History   Problem Relation Age of Onset  . Cancer Mother   . Diabetes Mother   . Hearing loss Mother   . Thyroid nodules Mother   . Anemia Mother   . Kidney disease Mother   . Hypertension Mother   . Obesity Mother   . Hyperlipidemia Father   . High blood pressure Father   . Sleep apnea Father   . Anemia Sister   . Arthritis Maternal Grandmother   . Arthritis Maternal Grandfather   . Arthritis Paternal Grandmother   . Kidney disease Paternal Grandmother   . Hypertension Paternal Grandmother   . Arthritis Paternal Grandfather   . Vision loss Paternal Grandfather   . Stroke Paternal Grandfather   . Hyperlipidemia Paternal Grandfather   . Anemia Sister     ROS: Review of Systems  Constitutional: Negative for weight loss.  Gastrointestinal: Negative for nausea and vomiting.  Musculoskeletal:       Negative for muscle weakness  Endo/Heme/Allergies:       Negative for hypoglycemia     PHYSICAL EXAM: Pt in no acute distress  RECENT LABS AND TESTS: BMET    Component Value Date/Time   NA 139 02/25/2019 0833   K 4.3 02/25/2019 0833   CL 102 02/25/2019 0833   CO2 21 02/25/2019 0833   GLUCOSE 275 (H) 02/25/2019 0833   GLUCOSE 208 (H) 11/02/2014 0944   BUN 9 02/25/2019 0833   CREATININE 0.70 02/25/2019 0833   CREATININE 0.70 11/02/2014 0944   CALCIUM 9.3 02/25/2019 0833   GFRNONAA 104 02/25/2019 0833   GFRAA 120 02/25/2019 0833   Lab Results  Component Value Date   HGBA1C 11.3 (H) 01/30/2019   HGBA1C 7.7 (H) 06/17/2015   HGBA1C 8.6 (H) 11/02/2014   HGBA1C 11.7 (H) 07/28/2014   HGBA1C 9.9 10/24/2010   No results found for: INSULIN CBC    Component Value Date/Time   WBC 4.8 02/25/2019 0833   WBC 8.6 11/02/2014 0944   RBC 4.41 02/25/2019 0833   RBC 4.37 11/02/2014 0944   HGB 11.5 02/25/2019 0833   HCT 36.3 02/25/2019 0833   PLT 343 02/25/2019 0833   MCV 82 02/25/2019 0833   MCH 26.1 (L) 02/25/2019 0833   MCH 25.4 (L) 11/02/2014 0944   MCHC 31.7 02/25/2019 0833    MCHC 31.8 11/02/2014 0944   RDW 14.9 02/25/2019 0833   LYMPHSABS 1.2 02/25/2019 0833   MONOABS 0.8 11/02/2014 0944   EOSABS 0.0 02/25/2019 2878  BASOSABS 0.0 02/25/2019 0833   Iron/TIBC/Ferritin/ %Sat    Component Value Date/Time   IRON 26 (L) 09/26/2010 1713   TIBC 429 09/26/2010 1713   FERRITIN 22 09/26/2010 1713   IRONPCTSAT 6 (L) 09/26/2010 1713   Lipid Panel     Component Value Date/Time   CHOL 254 (H) 03/14/2019 1258   TRIG 124 03/14/2019 1258   HDL 68 03/14/2019 1258   CHOLHDL 3.7 03/14/2019 1258   VLDL 23 11/02/2014 0944   LDLCALC 161 (H) 03/14/2019 1258   Hepatic Function Panel     Component Value Date/Time   PROT 7.0 02/25/2019 0833   ALBUMIN 3.9 02/25/2019 0833   AST 13 02/25/2019 0833   ALT 21 02/25/2019 0833   ALKPHOS 70 02/25/2019 0833   BILITOT 0.3 02/25/2019 0833      Component Value Date/Time   TSH 1.050 10/21/2018 1213   TSH 1.250 02/26/2018 1631   TSH 1.281 07/28/2014 1044     Ref. Range 01/14/2019 10:37  Vitamin D, 25-Hydroxy Latest Ref Range: 30.0 - 100.0 ng/mL 13.9 (L)    I, Doreene Nest, am acting as Location manager for [employer]. Owens Shark, DO  I have reviewed the above documentation for accuracy and completeness, and I agree with the above. -Jearld Lesch, DO

## 2019-04-14 ENCOUNTER — Encounter: Payer: Self-pay | Admitting: Family Medicine

## 2019-04-14 MED ORDER — BLOOD GLUCOSE SYSTEM PAK KIT: PACK | 3 refills | Status: DC

## 2019-04-14 MED ORDER — BLOOD GLUCOSE TEST VI STRP: ORAL_STRIP | 3 refills | Status: DC

## 2019-04-14 MED ORDER — LANCETS MISC: 3 refills | Status: DC

## 2019-04-18 ENCOUNTER — Encounter (INDEPENDENT_AMBULATORY_CARE_PROVIDER_SITE_OTHER): Payer: Self-pay | Admitting: Bariatrics

## 2019-04-21 ENCOUNTER — Ambulatory Visit (INDEPENDENT_AMBULATORY_CARE_PROVIDER_SITE_OTHER): Payer: Self-pay | Admitting: Bariatrics

## 2019-04-21 ENCOUNTER — Ambulatory Visit (INDEPENDENT_AMBULATORY_CARE_PROVIDER_SITE_OTHER): Payer: Medicare Other | Admitting: Orthopaedic Surgery

## 2019-04-21 ENCOUNTER — Encounter: Payer: Self-pay | Admitting: Orthopaedic Surgery

## 2019-04-21 ENCOUNTER — Ambulatory Visit (INDEPENDENT_AMBULATORY_CARE_PROVIDER_SITE_OTHER): Payer: Medicare Other

## 2019-04-21 ENCOUNTER — Other Ambulatory Visit: Payer: Self-pay

## 2019-04-21 DIAGNOSIS — M79641 Pain in right hand: Secondary | ICD-10-CM | POA: Diagnosis not present

## 2019-04-21 DIAGNOSIS — G8929 Other chronic pain: Secondary | ICD-10-CM

## 2019-04-21 DIAGNOSIS — M25511 Pain in right shoulder: Secondary | ICD-10-CM

## 2019-04-21 MED ORDER — PREDNISONE 10 MG (21) PO TBPK: ORAL_TABLET | ORAL | 0 refills | Status: DC

## 2019-04-21 NOTE — Progress Notes (Signed)
Office Visit Note   Patient: Katrina Montes           Date of Birth: October 04, 1971           MRN: [id number] Visit Date: 04/21/2019              Requested by: Salley Scarlet, MD [address] [place], [place] [address] PCP: Salley Scarlet, MD   Assessment & Plan: Visit Diagnoses:  1. Chronic right shoulder pain   2. Pain in right hand     Plan: Overall presentation is is not typical for orthopedic pathology based on initial presentation.  We will refer patient to Dr. Alvester Morin for nerve conduction studies to rule out compressive neuropathy.  We will prescribe prednisone taper to see if this will give her some relief at least temporarily.  We will await results of neck conduction studies.  If these turn out to be normal we will refer her back to her doctor who treats her for her MS.  Follow-Up Instructions: Return if symptoms worsen or fail to improve.   Orders:  Orders Placed This Encounter  Procedures  . XR Cervical Spine 2 or 3 views  . XR Shoulder Right   Meds ordered this encounter  Medications  . predniSONE (STERAPRED UNI-PAK 21 TAB) 10 MG (21) TBPK tablet    Sig: Take as directed    Dispense:  21 tablet    Refill:  0      Procedures: No procedures performed   Clinical Data: No additional findings.   Subjective: Chief Complaint  Patient presents with  . Right Shoulder - Pain    Katrina Montes is a very pleasant 48 year old female who comes in today for reports of right arm pain and numbness that is worse at night.  She does have multiple sclerosis.  Her pain pattern is inconsistent with how it radiates and how it presents.  Sometimes it travels from her shoulder down to her fingers sometimes it travels other way.  She is is CBD oil and Aspercreme which does help alleviate some of the pain.  She denies any injuries.   Review of Systems  Constitutional: Negative.   HENT: Negative.   Eyes: Negative.   Respiratory: Negative.   Cardiovascular: Negative.    Endocrine: Negative.   Musculoskeletal: Negative.   Neurological: Negative.   Hematological: Negative.   Psychiatric/Behavioral: Negative.   All other systems reviewed and are negative.    Objective: Vital Signs: There were no vitals taken for this visit.  Physical Exam Vitals signs and nursing note reviewed.  Constitutional:      Appearance: She is well-developed.  HENT:     Head: Normocephalic and atraumatic.  Neck:     Musculoskeletal: Neck supple.  Pulmonary:     Effort: Pulmonary effort is normal.  Abdominal:     Palpations: Abdomen is soft.  Skin:    General: Skin is warm.     Capillary Refill: Capillary refill takes less than 2 seconds.  Neurological:     Mental Status: She is alert and oriented to person, place, and time.  Psychiatric:        Behavior: Behavior normal.        Thought Content: Thought content normal.        Judgment: Judgment normal.     Ortho Exam Right upper extremity exam shows generalized discomfort with any palpation throughout the extremity.  Negative Finkelstein's.  Negative carpal tunnel compressive signs. Specialty Comments:  No  specialty comments available.  Imaging: Xr Cervical Spine 2 Or 3 Views  Result Date: 04/21/2019 No acute or structural abnormalities  Xr Shoulder Right  Result Date: 04/21/2019 No acute or structural abnormalities.  Mild enlargement of distal clavicle likely due to [facility] joint arthrosis.    PMFS History: Patient Active Problem List   Diagnosis Date Noted  . Class 3 severe obesity with serious comorbidity and body mass index (BMI) of 40.0 to 44.9 in adult (HCC) 03/25/2019  . De Quervain's disease (radial styloid tenosynovitis) 11/06/2018  . Gait abnormality 04/04/2018  . Depression 01/29/2017  . Migraine without aura and without status migrainosus, not intractable 03/30/2015  . Left lumbar radiculopathy 03/09/2015  . Right optic neuritis 01/14/2015  . Multiple sclerosis (HCC) 01/14/2015  .  Gastroenteritis 09/01/2014  . OVARIAN CYST 02/09/2011  . Morbid obesity (HCC) 10/24/2010  . Diabetes mellitus type II, uncontrolled (HCC) 09/22/2010  . Vitamin D deficiency 09/22/2010  . ANEMIA-IRON DEFICIENCY 09/22/2010  . Essential hypertension 09/22/2010   Past Medical History:  Diagnosis Date  . Anemia   . Anxiety   . Back pain   . Carpal tunnel syndrome   . Constipation   . Depression   . Diabetes mellitus   . Hypertension   . Joint pain   . Lactose intolerance   . Multiple sclerosis (HCC)   . Optic neuritis   . Shortness of breath   . Swallowing difficulty   . Swelling of both lower extremities   . Vision, loss, sudden   . Vitamin D deficiency     Family History  Problem Relation Age of Onset  . Cancer Mother   . Diabetes Mother   . Hearing loss Mother   . Thyroid nodules Mother   . Anemia Mother   . Kidney disease Mother   . Hypertension Mother   . Obesity Mother   . Hyperlipidemia Father   . High blood pressure Father   . Sleep apnea Father   . Anemia Sister   . Arthritis Maternal Grandmother   . Arthritis Maternal Grandfather   . Arthritis Paternal Grandmother   . Kidney disease Paternal Grandmother   . Hypertension Paternal Grandmother   . Arthritis Paternal Grandfather   . Vision loss Paternal Grandfather   . Stroke Paternal Grandfather   . Hyperlipidemia Paternal Grandfather   . Anemia Sister     Past Surgical History:  Procedure Laterality Date  . BREAST REDUCTION SURGERY    . CYST EXCISION    . ovarian cyst removed    . TUBAL LIGATION    . WISDOM TOOTH EXTRACTION     Social History   Occupational History  . 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 Smoking status: Never Smoker  . Smokeless tobacco: Never Used  Substance and Sexual Activity  . Alcohol use: No  . Drug use: No  . Sexual activity: Not Currently    Birth control/protection: Pill

## 2019-04-25 ENCOUNTER — Encounter: Payer: Self-pay | Admitting: Family Medicine

## 2019-05-05 ENCOUNTER — Encounter: Payer: Self-pay | Admitting: Family Medicine

## 2019-05-07 ENCOUNTER — Other Ambulatory Visit: Payer: Self-pay | Admitting: *Deleted

## 2019-05-07 MED ORDER — BLOOD GLUCOSE SYSTEM PAK KIT: PACK | 3 refills | Status: DC

## 2019-05-07 MED ORDER — BLOOD GLUCOSE TEST VI STRP: ORAL_STRIP | 3 refills | Status: DC

## 2019-05-07 MED ORDER — LANCETS MISC: 3 refills | Status: DC

## 2019-05-14 ENCOUNTER — Other Ambulatory Visit: Payer: Self-pay

## 2019-05-14 ENCOUNTER — Encounter: Payer: Self-pay | Admitting: Family Medicine

## 2019-05-14 ENCOUNTER — Ambulatory Visit (INDEPENDENT_AMBULATORY_CARE_PROVIDER_SITE_OTHER): Payer: Medicare Other | Admitting: Family Medicine

## 2019-05-14 VITALS — BP 110/66 | HR 97 | Temp 98.9°F | Resp 18 | Ht 66.5 in | Wt 273.6 lb

## 2019-05-14 DIAGNOSIS — E1165 Type 2 diabetes mellitus with hyperglycemia: Secondary | ICD-10-CM | POA: Diagnosis not present

## 2019-05-14 DIAGNOSIS — G35 Multiple sclerosis: Secondary | ICD-10-CM

## 2019-05-14 DIAGNOSIS — I10 Essential (primary) hypertension: Secondary | ICD-10-CM | POA: Diagnosis not present

## 2019-05-14 DIAGNOSIS — E559 Vitamin D deficiency, unspecified: Secondary | ICD-10-CM | POA: Diagnosis not present

## 2019-05-14 MED ORDER — TOPIRAMATE 100 MG PO TABS: 100.0000 mg | ORAL_TABLET | Freq: Two times a day (BID) | ORAL | 3 refills | Status: DC

## 2019-05-14 MED ORDER — VENLAFAXINE HCL ER 75 MG PO CP24: 150.0000 mg | ORAL_CAPSULE | Freq: Every day | ORAL | 3 refills | Status: DC

## 2019-05-14 NOTE — Assessment & Plan Note (Signed)
Has had some weight loss.  Continue to work on dietary changes.

## 2019-05-14 NOTE — Assessment & Plan Note (Signed)
Is not on any over-the-counter vitamin D at this time.  She did complete the prescription vitamin D we will recheck her level and decide on how much daily to put her on.

## 2019-05-14 NOTE — Assessment & Plan Note (Signed)
She continues to follow with neurology.  She understands that she is high risk for complications of COVID has been staying within her home for the most part.

## 2019-05-14 NOTE — Progress Notes (Signed)
Subjective:    Patient ID: Katrina Montes, female    DOB: [birth year], 48 y.o.   MRN: [id number]  Patient presents for Follow-up (dm)  Pt here to f/u chronic medical problems.  DM- now has strips and lancets, was not able to monitor past few weeks, has not checked this AM  Taking Basaglar 22 units - having difficulty putting insulin in due to right hand bringing in a brace she is currently being worked up by her orthopedic surgeon for possible carpal tunnel syndrome.  She has been trying to inject into her thigh but does admit that she has missed a few days of her insulin.  She has been consistent on the Janumet.  She continues to follow with the weight loss clinic and has been trying to follow her meal plan.  Her weight is down 7 pounds in the past 2 months.  Note her last A1c 3 months ago was 11.3% when she was restarted on her medications  HTN- taking BP meds as prescribed  Migraines doing well on topamax    Review Of Systems:  GEN- denies fatigue, fever, weight loss,weakness, recent illness HEENT- denies eye drainage, change in vision, nasal discharge, CVS- denies chest pain, palpitations RESP- denies SOB, cough, wheeze ABD- denies N/V, change in stools, abd pain GU- denies dysuria, hematuria, dribbling, incontinence MSK- denies joint pain, muscle aches, injury Neuro- denies headache, dizziness, syncope, seizure activity       Objective:    BP 110/66   Pulse 97   Temp 98.9 F (37.2 C)   Resp 18   Ht 5' 6.5" (1.689 m)   Wt 273 lb 9.6 oz (124.1 kg)   SpO2 98%   BMI 43.50 kg/m  GEN- NAD, alert and oriented x3 HEENT- PERRL, EOMI, non injected sclera, pink conjunctiva, MMM, oropharynx clear Neck- Supple, no thyromegaly CVS- RRR, no murmur RESP-CTAB EXT- No edema, right hand in brace. Pulses- Radial, DP- 2+        Assessment & Plan:      Problem List Items Addressed This Visit      Unprioritized   Diabetes mellitus type II, uncontrolled (HCC)    She does not  have any fasting readings with her today but with the weight loss hopefully her blood sugars have improved we will recheck an A1c.      Relevant Orders   Hemoglobin A1c   Lipid panel   Microalbumin / creatinine urine ratio   HM DIABETES FOOT EXAM (Completed)   Essential hypertension - Primary    Blood pressure well controlled no change in medication.      Relevant Orders   CBC with Differential/Platelet   Comprehensive metabolic panel   Lipid panel   Microalbumin / creatinine urine ratio   Morbid obesity (HCC)    Has had some weight loss.  Continue to work on dietary changes.      Multiple sclerosis (HCC)    She continues to follow with neurology.  She understands that she is high risk for complications of COVID has been staying within her home for the most part.      Vitamin D deficiency    Is not on any over-the-counter vitamin D at this time.  She did complete the prescription vitamin D we will recheck her level and decide on how much daily to put her on.      Relevant Orders   Vitamin D, 25-hydroxy      Note: This dictation was prepared with Dragon dictation  along with smaller phrase technology. Any transcriptional errors that result from this process are unintentional.

## 2019-05-14 NOTE — Assessment & Plan Note (Signed)
Blood pressure well controlled no change in medication 

## 2019-05-14 NOTE — Assessment & Plan Note (Signed)
She does not have any fasting readings with her today but with the weight loss hopefully her blood sugars have improved we will recheck an A1c.

## 2019-05-14 NOTE — Patient Instructions (Addendum)
We will call with lab results  F/U 4 month for Physical

## 2019-05-15 LAB — MICROALBUMIN / CREATININE URINE RATIO
Creatinine, Urine: 165 mg/dL (ref 20–275)
Microalb Creat Ratio: 5 mcg/mg creat (ref <30)
Microalb, Ur: 0.9 mg/dL

## 2019-05-15 LAB — COMPREHENSIVE METABOLIC PANEL
AG Ratio: 1.2 (calc) (ref 1.0–2.5)
ALT: 17 U/L (ref 6–29)
AST: 12 U/L (ref 10–35)
Albumin: 4.3 g/dL (ref 3.6–5.1)
Alkaline phosphatase (APISO): 58 U/L (ref 31–125)
BUN: 14 mg/dL (ref 7–25)
CO2: 21 mmol/L (ref 20–32)
Calcium: 10.2 mg/dL (ref 8.6–10.2)
Chloride: 105 mmol/L (ref 98–110)
Creat: 0.91 mg/dL (ref 0.50–1.10)
Globulin: 3.6 g/dL (calc) (ref 1.9–3.7)
Glucose, Bld: 204 mg/dL — ABNORMAL HIGH (ref 65–99)
Potassium: 4.7 mmol/L (ref 3.5–5.3)
Sodium: 138 mmol/L (ref 135–146)
Total Bilirubin: 0.4 mg/dL (ref 0.2–1.2)
Total Protein: 7.9 g/dL (ref 6.1–8.1)

## 2019-05-15 LAB — CBC WITH DIFFERENTIAL/PLATELET
Absolute Monocytes: 489 cells/uL (ref 200–950)
Basophils Absolute: 29 cells/uL (ref 0–200)
Basophils Relative: 0.4 %
Eosinophils Absolute: 29 cells/uL (ref 15–500)
Eosinophils Relative: 0.4 %
HCT: 37.6 % (ref 35.0–45.0)
Hemoglobin: 12.1 g/dL (ref 11.7–15.5)
Lymphs Abs: 1497 cells/uL (ref 850–3900)
MCH: 26.8 pg — ABNORMAL LOW (ref 27.0–33.0)
MCHC: 32.2 g/dL (ref 32.0–36.0)
MCV: 83.4 fL (ref 80.0–100.0)
MPV: 9.7 fL (ref 7.5–12.5)
Monocytes Relative: 6.7 %
Neutro Abs: 5256 cells/uL (ref 1500–7800)
Neutrophils Relative %: 72 %
Platelets: 373 10*3/uL (ref 140–400)
RBC: 4.51 10*6/uL (ref 3.80–5.10)
RDW: 15.5 % — ABNORMAL HIGH (ref 11.0–15.0)
Total Lymphocyte: 20.5 %
WBC: 7.3 10*3/uL (ref 3.8–10.8)

## 2019-05-15 LAB — HEMOGLOBIN A1C
Hgb A1c MFr Bld: 9.3 % of total Hgb — ABNORMAL HIGH (ref <5.7)
Mean Plasma Glucose: 220 (calc)
eAG (mmol/L): 12.2 (calc)

## 2019-05-15 LAB — LIPID PANEL
Cholesterol: 252 mg/dL — ABNORMAL HIGH (ref <200)
HDL: 77 mg/dL (ref >=50)
LDL Cholesterol (Calc): 146 mg/dL (calc) — ABNORMAL HIGH
Non-HDL Cholesterol (Calc): 175 mg/dL (calc) — ABNORMAL HIGH (ref <130)
Total CHOL/HDL Ratio: 3.3 (calc) (ref <5.0)
Triglycerides: 159 mg/dL — ABNORMAL HIGH (ref <150)

## 2019-05-15 LAB — VITAMIN D 25 HYDROXY (VIT D DEFICIENCY, FRACTURES): Vit D, 25-Hydroxy: 23 ng/mL — ABNORMAL LOW (ref 30–100)

## 2019-05-30 ENCOUNTER — Ambulatory Visit (INDEPENDENT_AMBULATORY_CARE_PROVIDER_SITE_OTHER): Payer: Medicare Other | Admitting: Neurology

## 2019-05-30 DIAGNOSIS — G4720 Circadian rhythm sleep disorder, unspecified type: Secondary | ICD-10-CM

## 2019-05-30 DIAGNOSIS — R0683 Snoring: Secondary | ICD-10-CM

## 2019-05-30 DIAGNOSIS — G4733 Obstructive sleep apnea (adult) (pediatric): Secondary | ICD-10-CM

## 2019-05-30 DIAGNOSIS — R519 Headache, unspecified: Secondary | ICD-10-CM

## 2019-05-30 DIAGNOSIS — G4719 Other hypersomnia: Secondary | ICD-10-CM

## 2019-05-30 DIAGNOSIS — Z820 Family history of epilepsy and other diseases of the nervous system: Secondary | ICD-10-CM

## 2019-05-30 DIAGNOSIS — R0689 Other abnormalities of breathing: Secondary | ICD-10-CM

## 2019-05-31 ENCOUNTER — Other Ambulatory Visit: Payer: Self-pay

## 2019-06-03 ENCOUNTER — Ambulatory Visit (INDEPENDENT_AMBULATORY_CARE_PROVIDER_SITE_OTHER): Payer: Medicare Other | Admitting: Physical Medicine and Rehabilitation

## 2019-06-03 ENCOUNTER — Encounter: Payer: Self-pay | Admitting: Physical Medicine and Rehabilitation

## 2019-06-03 ENCOUNTER — Other Ambulatory Visit: Payer: Self-pay

## 2019-06-03 DIAGNOSIS — R202 Paresthesia of skin: Secondary | ICD-10-CM | POA: Diagnosis not present

## 2019-06-03 NOTE — Procedures (Signed)
EMG & NCV Findings: Evaluation of the right median motor nerve showed prolonged distal onset latency (7.6 ms), reduced amplitude (4.0 mV), and decreased conduction velocity (Elbow-Wrist, 47 m/s).  The right ulnar motor nerve showed decreased conduction velocity (B Elbow-Wrist, 51 m/s) and decreased conduction velocity (A Elbow-B Elbow, 50 m/s).  The right median (across palm) sensory nerve showed no response (Wrist) and prolonged distal peak latency (Palm, 7.8 ms).  All remaining nerves (as indicated in the following tables) were within normal limits.    Needle evaluation of the right abductor pollicis brevis muscle showed slightly increased spontaneous activity.  All remaining muscles (as indicated in the following table) showed no evidence of electrical instability.    Impression: The above electrodiagnostic study is ABNORMAL and reveals evidence of a moderate to severe right median nerve entrapment at the wrist (carpal tunnel syndrome) affecting sensory and motor components.   There is no significant electrodiagnostic evidence of any other focal nerve entrapment, brachial plexopathy or cervical radiculopathy.   Recommendations: 1.  Follow-up with referring physician. 2.  Continue current management of symptoms. 3.  Suggest surgical evaluation.  ___________________________ Laurence Spates FAAPMR Board Certified, American Board of Physical Medicine and Rehabilitation    Nerve Conduction Studies Anti Sensory Summary Table   Stim Site NR Peak (ms) Norm Peak (ms) P-T Amp (V) Norm P-T Amp Site1 Site2 Delta-P (ms) Dist (cm) Vel (m/s) Norm Vel (m/s)  Right Median Acr Palm Anti Sensory (2nd Digit)  32C  Wrist *NR  <3.6  >10 Wrist Palm  0.0    Palm    *7.8 <2.0 4.4         Right Radial Anti Sensory (Base 1st Digit)  32.2C  Wrist    2.3 <3.1 13.3  Wrist Base 1st Digit 2.3 0.0    Right Ulnar Anti Sensory (5th Digit)  32.3C  Wrist    3.6 <3.7 18.6 >15.0 Wrist 5th Digit 3.6 14.0 39 >38   Motor  Summary Table   Stim Site NR Onset (ms) Norm Onset (ms) O-P Amp (mV) Norm O-P Amp Site1 Site2 Delta-0 (ms) Dist (cm) Vel (m/s) Norm Vel (m/s)  Right Median Motor (Abd Poll Brev)  32C  Wrist    *7.6 <4.2 *4.0 >5 Elbow Wrist 5.1 23.8 *47 >50  Elbow    12.7  2.1         Right Ulnar Motor (Abd Dig Min)  31.7C  Wrist    3.4 <4.2 7.1 >3 B Elbow Wrist 4.5 23.0 *51 >53  B Elbow    7.9  7.3  A Elbow B Elbow 2.0 10.0 *50 >53  A Elbow    9.9  7.5          EMG   Side Muscle Nerve Root Ins Act Fibs Psw Amp Dur Poly Recrt Int Fraser Din Comment  Right Abd Poll Brev Median C8-T1 Nml *1+ *1+ Nml Nml 0 Nml Nml   Right 1stDorInt Ulnar C8-T1 Nml Nml Nml Nml Nml 0 Nml Nml   Right PronatorTeres Median C6-7 Nml Nml Nml Nml Nml 0 Nml Nml   Right Biceps Musculocut C5-6 Nml Nml Nml Nml Nml 0 Nml Nml   Right Deltoid Axillary C5-6 Nml Nml Nml Nml Nml 0 Nml Nml     Nerve Conduction Studies Anti Sensory Left/Right Comparison   Stim Site L Lat (ms) R Lat (ms) L-R Lat (ms) L Amp (V) R Amp (V) L-R Amp (%) Site1 Site2 L Vel (m/s) R Vel (m/s) L-R Vel (m/s)  Median Acr Palm Anti Sensory (2nd Digit)  32C  Wrist       Wrist Palm     Palm  *7.8   4.4        Radial Anti Sensory (Base 1st Digit)  32.2C  Wrist  2.3   13.3  Wrist Base 1st Digit     Ulnar Anti Sensory (5th Digit)  32.3C  Wrist  3.6   18.6  Wrist 5th Digit  39    Motor Left/Right Comparison   Stim Site L Lat (ms) R Lat (ms) L-R Lat (ms) L Amp (mV) R Amp (mV) L-R Amp (%) Site1 Site2 L Vel (m/s) R Vel (m/s) L-R Vel (m/s)  Median Motor (Abd Poll Brev)  32C  Wrist  *7.6   *4.0  Elbow Wrist  *47   Elbow  12.7   2.1        Ulnar Motor (Abd Dig Min)  31.7C  Wrist  3.4   7.1  B Elbow Wrist  *51   B Elbow  7.9   7.3  A Elbow B Elbow  *50   A Elbow  9.9   7.5           Waveforms:

## 2019-06-03 NOTE — Progress Notes (Signed)
.  Numeric Pain Rating Scale and Functional Assessment Average Pain 10   In the last MONTH (on 0-10 scale) has pain interfered with the following?  1. General activity like being  able to carry out your everyday physical activities such as walking, climbing stairs, carrying groceries, or moving a chair?  Rating(8)    

## 2019-06-03 NOTE — Progress Notes (Signed)
Katrina Montes - 48 y.o. female MRN [id number]  Date of birth: 13-Oct-1971  Office Visit Note: Visit Date: 06/03/2019 PCP: Salley Scarleturham, Kawanta F, MD Referred by: Salley Scarleturham, Kawanta F, MD  Subjective: Chief Complaint  Patient presents with  . Right Arm - Pain  . Right Wrist - Pain  . Right Hand - Numbness   HPI:  Katrina Kirschnerngele Levay is a 48 y.o. female who comes in today At the request of Dr. Laren BoomMichael Chun for electrodiagnostic study of the right upper limb.  Patient reports several months, really starting February 2020, of pain in the right arm and right wrist with numbness and tingling in the right hand in a nondermatomal fashion in all the fingers.  She reports worsening with using her right arm and right hand.  She is right-hand dominant.  She denies any frank radicular symptoms down the arms or any frank injury.  She does report that prednisone seemed to help the pain quite a bit at least for a time.  She reports her pain is a 10 out of 10 however.  Her case is complicated by history of insulin-dependent diabetes.  Last hemoglobin A1c was 9.3 but is been as high as 11 over the last year.  Her case is further complicated by history of multiple sclerosis, migraine and morbid obesity.  She has not had electrodiagnostic studies that I can tell from the chart.  ROS Otherwise per HPI.  Assessment & Plan: Visit Diagnoses:  1. Paresthesia of skin     Plan: Impression: The above electrodiagnostic study is ABNORMAL and reveals evidence of a moderate to severe right median nerve entrapment at the wrist (carpal tunnel syndrome) affecting sensory and motor components.   There is no significant electrodiagnostic evidence of any other focal nerve entrapment, brachial plexopathy or cervical radiculopathy.   Recommendations: 1.  Follow-up with referring physician. 2.  Continue current management of symptoms. 3.  Suggest surgical evaluation.  Meds & Orders: No orders of the defined types were placed in this  encounter.   Orders Placed This Encounter  Procedures  . NCV with EMG (electromyography)    Follow-up: Return for Glee ArvinMichael Xu, MD.   Procedures: No procedures performed  EMG & NCV Findings: Evaluation of the right median motor nerve showed prolonged distal onset latency (7.6 ms), reduced amplitude (4.0 mV), and decreased conduction velocity (Elbow-Wrist, 47 m/s).  The right ulnar motor nerve showed decreased conduction velocity (B Elbow-Wrist, 51 m/s) and decreased conduction velocity (A Elbow-B Elbow, 50 m/s).  The right median (across palm) sensory nerve showed no response (Wrist) and prolonged distal peak latency (Palm, 7.8 ms).  All remaining nerves (as indicated in the following tables) were within normal limits.    Needle evaluation of the right abductor pollicis brevis muscle showed slightly increased spontaneous activity.  All remaining muscles (as indicated in the following table) showed no evidence of electrical instability.    Impression: The above electrodiagnostic study is ABNORMAL and reveals evidence of a moderate to severe right median nerve entrapment at the wrist (carpal tunnel syndrome) affecting sensory and motor components.   There is no significant electrodiagnostic evidence of any other focal nerve entrapment, brachial plexopathy or cervical radiculopathy.   Recommendations: 1.  Follow-up with referring physician. 2.  Continue current management of symptoms. 3.  Suggest surgical evaluation.  ___________________________ Elease HashimotoFred Ondre Salvetti FAAPMR Board Certified, American Board of Physical Medicine and Rehabilitation    Nerve Conduction Studies Anti Sensory Summary Table   Stim Site NR Peak (ms)  Norm Peak (ms) P-T Amp (V) Norm P-T Amp Site1 Site2 Delta-P (ms) Dist (cm) Vel (m/s) Norm Vel (m/s)  Right Median Acr Palm Anti Sensory (2nd Digit)  32C  Wrist *NR  <3.6  >10 Wrist Palm  0.0    Palm    *7.8 <2.0 4.4         Right Radial Anti Sensory (Base 1st Digit)   32.2C  Wrist    2.3 <3.1 13.3  Wrist Base 1st Digit 2.3 0.0    Right Ulnar Anti Sensory (5th Digit)  32.3C  Wrist    3.6 <3.7 18.6 >15.0 Wrist 5th Digit 3.6 14.0 39 >38   Motor Summary Table   Stim Site NR Onset (ms) Norm Onset (ms) O-P Amp (mV) Norm O-P Amp Site1 Site2 Delta-0 (ms) Dist (cm) Vel (m/s) Norm Vel (m/s)  Right Median Motor (Abd Poll Brev)  32C  Wrist    *7.6 <4.2 *4.0 >5 Elbow Wrist 5.1 23.8 *47 >50  Elbow    12.7  2.1         Right Ulnar Motor (Abd Dig Min)  31.7C  Wrist    3.4 <4.2 7.1 >3 B Elbow Wrist 4.5 23.0 *51 >53  B Elbow    7.9  7.3  A Elbow B Elbow 2.0 10.0 *50 >53  A Elbow    9.9  7.5          EMG   Side Muscle Nerve Root Ins Act Fibs Psw Amp Dur Poly Recrt Int Dennie BiblePat Comment  Right Abd Poll Brev Median C8-T1 Nml *1+ *1+ Nml Nml 0 Nml Nml   Right 1stDorInt Ulnar C8-T1 Nml Nml Nml Nml Nml 0 Nml Nml   Right PronatorTeres Median C6-7 Nml Nml Nml Nml Nml 0 Nml Nml   Right Biceps Musculocut C5-6 Nml Nml Nml Nml Nml 0 Nml Nml   Right Deltoid Axillary C5-6 Nml Nml Nml Nml Nml 0 Nml Nml     Nerve Conduction Studies Anti Sensory Left/Right Comparison   Stim Site L Lat (ms) R Lat (ms) L-R Lat (ms) L Amp (V) R Amp (V) L-R Amp (%) Site1 Site2 L Vel (m/s) R Vel (m/s) L-R Vel (m/s)  Median Acr Palm Anti Sensory (2nd Digit)  32C  Wrist       Wrist Palm     Palm  *7.8   4.4        Radial Anti Sensory (Base 1st Digit)  32.2C  Wrist  2.3   13.3  Wrist Base 1st Digit     Ulnar Anti Sensory (5th Digit)  32.3C  Wrist  3.6   18.6  Wrist 5th Digit  39    Motor Left/Right Comparison   Stim Site L Lat (ms) R Lat (ms) L-R Lat (ms) L Amp (mV) R Amp (mV) L-R Amp (%) Site1 Site2 L Vel (m/s) R Vel (m/s) L-R Vel (m/s)  Median Motor (Abd Poll Brev)  32C  Wrist  *7.6   *4.0  Elbow Wrist  *47   Elbow  12.7   2.1        Ulnar Motor (Abd Dig Min)  31.7C  Wrist  3.4   7.1  B Elbow Wrist  *51   B Elbow  7.9   7.3  A Elbow B Elbow  *50   A Elbow  9.9   7.5            Waveforms:  Clinical History: No specialty comments available.     Objective:  VS:  HT:    WT:   BMI:     BP:   HR: bpm  TEMP: ( )  RESP:  Physical Exam Musculoskeletal:        General: No swelling, tenderness or deformity.     Comments: Inspection reveals flattening of the right APB compared to left but no atrophy of the bilateral  FDI or hand intrinsics. There is no swelling, color changes, allodynia or dystrophic changes. There is 5 out of 5 strength in the bilateral wrist extension, finger abduction and long finger flexion. There is intact sensation to light touch in all dermatomal and peripheral nerve distributions.  There is a negative Hoffmann's test bilaterally.  Skin:    General: Skin is warm and dry.     Findings: No erythema or rash.  Neurological:     General: No focal deficit present.     Mental Status: She is alert and oriented to person, place, and time.     Motor: No weakness or abnormal muscle tone.     Coordination: Coordination normal.  Psychiatric:        Mood and Affect: Mood normal.        Behavior: Behavior normal.     Ortho Exam Imaging: No results found.

## 2019-06-04 ENCOUNTER — Telehealth: Payer: Self-pay

## 2019-06-04 ENCOUNTER — Encounter (INDEPENDENT_AMBULATORY_CARE_PROVIDER_SITE_OTHER): Payer: Self-pay | Admitting: Bariatrics

## 2019-06-04 DIAGNOSIS — G4733 Obstructive sleep apnea (adult) (pediatric): Secondary | ICD-10-CM | POA: Insufficient documentation

## 2019-06-04 NOTE — Progress Notes (Signed)
Patient referred by Dr. Jearld Lesch, seen by me on 02/26/19, diagnostic PSG on 05/30/19.    Please call and notify the patient that the recent sleep study did confirm the diagnosis of obstructive sleep apnea. OSA is overall mild (more pronounced in dream sleep), but worth treating to see if she feels better after treatment. To that end I recommend treatment for this in the form of autoPAP, which means, that we don't have to bring her back for a second sleep study with CPAP, but will let him try an autoPAP machine at home, through a DME company (of her choice, or as per insurance requirement). The DME representative will educate her on how to use the machine, how to put the mask on, etc. I have placed an order in the chart. Please send referral, talk to patient, send report to referring MD. We will need a FU in sleep clinic for 10 weeks post-PAP set up, please arrange that with me or one of our NPs. Thanks,   Star Age, MD, PhD [facility])

## 2019-06-04 NOTE — Procedures (Signed)
PATIENT'S NAME:  Katrina Montes, [name] DOB:      19-Mar-1971      MR#:    [id number]     DATE OF RECORDING: 05/30/2019 REFERRING M.D.:  Dr. Corinna CapraAngel Brown Study Performed:   Baseline Polysomnogram HISTORY: [age] year old woman with a history of vitamin D deficiency, multiple sclerosis, hypertension, diabetes, depression, anxiety, anemia, and morbid obesity, who reports snoring and excessive daytime somnolence. The patient endorsed the Epworth Sleepiness Scale at 15 points. The patient's weight 287 pounds with a height of 66 (inches), resulting in a BMI of 46.1 kg/m2. The patient's neck circumference measured 16 inches.  CURRENT MEDICATIONS: Flexeril, Neurontin, Advil, Janumet, Prinzide, Mobic, Antivert, Maxalt, Desyrel, Topamax, Effexor, Drisdol   PROCEDURE:  This is a multichannel digital polysomnogram utilizing the Somnostar 11.2 system.  Electrodes and sensors were applied and monitored per AASM Specifications.   EEG, EOG, Chin and Limb EMG, were sampled at 200 Hz.  ECG, Snore and Nasal Pressure, Thermal Airflow, Respiratory Effort, CPAP Flow and Pressure, Oximetry was sampled at 50 Hz. Digital video and audio were recorded.      BASELINE STUDY  Lights Out was at 22:49 and Lights On at 05:00.  Total recording time (TRT) was 371 minutes, with a total sleep time (TST) of 280.5 minutes.   The patient's sleep latency was 44.5 minutes.  REM latency was 71 minutes.  The sleep efficiency was 75.6 %.     SLEEP ARCHITECTURE: WASO (Wake after sleep onset) was 45.5 minutes with mild to moderate sleep fragmentation noted. There were 55.5 minutes in Stage N1, 109 minutes Stage N2, 65.5 minutes Stage N3 and 50.5 minutes in Stage REM.  The percentage of Stage N1 was 19.8%, which is increased, Stage N2 was 38.9%, which is reduced, Stage N3 was 23.4%, which is slightly increased, and Stage R (REM sleep) was 18.%, which is near-normal. The arousals were noted as: 132 were spontaneous, 0 were associated with PLMs, 20 were  associated with respiratory events.  RESPIRATORY ANALYSIS:  There were a total of 39 respiratory events:  4 obstructive apneas, 4 central apneas and 1 mixed apneas with a total of 9 apneas and an apnea index (AI) of 1.9 /hour. There were 30 hypopneas with a hypopnea index of 6.4 /hour. The patient also had 0 respiratory event related arousals (RERAs).      The total APNEA/HYPOPNEA INDEX (AHI) was 8.3 /hour and the total RESPIRATORY DISTURBANCE INDEX was 8.3 /hour.  34 events occurred in REM sleep and 8 events in NREM. The REM AHI was 40.4 /hour, versus a non-REM AHI of 1.3. The patient spent 255 minutes of total sleep time in the supine position and 26 minutes in non-supine.. The supine AHI was 6.1 versus a non-supine AHI of 30.6.  OXYGEN SATURATION & C02:  The Wake baseline 02 saturation was 96%, with the lowest being 85%. Time spent below 89% saturation equaled 1 minutes.  PERIODIC LIMB MOVEMENTS: The patient had a total of 0 Periodic Limb Movements.  The Periodic Limb Movement (PLM) index was 0 and the PLM Arousal index was 0/hour.  Audio and video analysis did not show any abnormal or unusual movements, behaviors, phonations or vocalizations. The patient took 2 bathroom breaks. Mild snoring was noted. The EKG was in keeping with normal sinus rhythm (NSR).  Post-study, the patient indicated that sleep was better than usual.   IMPRESSION:  1. Obstructive Sleep Apnea (OSA) 2. Dysfunctions associated with sleep stages or arousal from sleep  RECOMMENDATIONS:  1. This  study demonstrates overall mild obstructive sleep apnea, severe in REM sleep with a total AHI of 8.3/hour, REM AHI of 40.4/hour, supine AHI of 6.1/hour, and O2 nadir of 85%. Given the patient's medical history and sleep related complaints, treatment with positive airway pressure is recommended; this can be achieved in the form of autoPAP. Alternatively, a full-night CPAP titration study would allow optimization of therapy if needed  and when deemed safe down the road (due to the current COVID-19 pandemic). Other treatment options may include avoidance of supine sleep position along with weight loss, upper airway or jaw surgery in selected patients or the use of an oral appliance in certain patients. ENT evaluation and/or consultation with a maxillofacial surgeon or dentist may be feasible in some instances. Please note that untreated obstructive sleep apnea may carry additional perioperative morbidity. Patients with significant obstructive sleep apnea should receive perioperative PAP therapy and the surgeons and particularly the anesthesiologist should be informed of the diagnosis and the severity of the sleep disordered breathing. 2. This study shows sleep fragmentation and abnormal sleep stage percentages; these are nonspecific findings and per se do not signify an intrinsic sleep disorder or a cause for the patient's sleep-related symptoms. Causes include (but are not limited to) the first night effect of the sleep study, circadian rhythm disturbances, medication effect or an underlying mood disorder or medical problem.  3. The patient should be cautioned not to drive, work at heights, or operate dangerous or heavy equipment when tired or sleepy. Review and reiteration of good sleep hygiene measures should be pursued with any patient. 4. The patient will be seen in follow-up in the sleep clinic at [facility] for discussion of the test results, symptom and treatment compliance review, further management strategies, etc. The referring provider will be notified of the test results.  I certify that I have reviewed the entire raw data recording prior to the issuance of this report in accordance with the Standards of Accreditation of the American Academy of Sleep Medicine (AASM)  Star Age, MD, PhD Diplomat, American Board of Neurology and Sleep Medicine (Neurology and Sleep Medicine)

## 2019-06-04 NOTE — Telephone Encounter (Signed)
I called pt. I advised pt that Dr. Rexene Alberts reviewed their sleep study results and found that pt has overall mild osa, worse in REM sleep. Dr. Rexene Alberts recommends that pt start an auto pap at home. I reviewed PAP compliance expectations with the pt. Pt is agreeable to starting an auto-PAP. I advised pt that an order will be sent to a DME, [facility], and [facility] will call the pt within about one week after they file with the pt's insurance. [facility] will show the pt how to use the machine, fit for masks, and troubleshoot the auto-PAP if needed. A follow up appt was made for insurance purposes with Dr. Rexene Alberts on 08/21/2019 at 10:30am . Pt verbalized understanding to arrive 15 minutes early and bring their auto-PAP. A letter with all of this information in it will be mailed to the pt as a reminder. I verified with the pt that the address we have on file is correct. Pt verbalized understanding of results. Pt had no questions at this time but was encouraged to call back if questions arise. I have sent the order to [employer] and have received confirmation that they have received the order.

## 2019-06-04 NOTE — Addendum Note (Signed)
Addended by: Star Age on: 06/04/2019 08:29 AM   Modules accepted: Orders

## 2019-06-04 NOTE — Telephone Encounter (Signed)
-----   Message from Star Age, MD sent at 06/04/2019  8:29 AM EDT ----- Patient referred by Dr. Jearld Lesch, seen by me on 02/26/19, diagnostic PSG on 05/30/19.    Please call and notify the patient that the recent sleep study did confirm the diagnosis of obstructive sleep apnea. OSA is overall mild (more pronounced in dream sleep), but worth treating to see if she feels better after treatment. To that end I recommend treatment for this in the form of autoPAP, which means, that we don't have to bring her back for a second sleep study with CPAP, but will let him try an autoPAP machine at home, through a DME company (of her choice, or as per insurance requirement). The DME representative will educate her on how to use the machine, how to put the mask on, etc. I have placed an order in the chart. Please send referral, talk to patient, send report to referring MD. We will need a FU in sleep clinic for 10 weeks post-PAP set up, please arrange that with me or one of our NPs. Thanks,   Star Age, MD, PhD [facility])

## 2019-06-05 ENCOUNTER — Telehealth: Payer: Self-pay

## 2019-06-05 NOTE — Telephone Encounter (Signed)
Unable to get in contact with the patient to convert their office appt with Sarah  on 06/09/2019 into a mychart video visit. I left a voicemail asking the patient to return my call. Office number was provided.    If patient calls back please convert their office visit into a mychart video visit.   

## 2019-06-09 ENCOUNTER — Ambulatory Visit (INDEPENDENT_AMBULATORY_CARE_PROVIDER_SITE_OTHER): Payer: Medicare Other | Admitting: Neurology

## 2019-06-09 ENCOUNTER — Encounter: Payer: Self-pay | Admitting: Neurology

## 2019-06-09 ENCOUNTER — Other Ambulatory Visit: Payer: Self-pay

## 2019-06-09 VITALS — BP 150/90 | HR 89 | Temp 97.5°F | Ht 66.5 in | Wt 294.6 lb

## 2019-06-09 DIAGNOSIS — F3289 Other specified depressive episodes: Secondary | ICD-10-CM | POA: Diagnosis not present

## 2019-06-09 DIAGNOSIS — G35 Multiple sclerosis: Secondary | ICD-10-CM

## 2019-06-09 NOTE — Progress Notes (Signed)
PATIENT: Katrina Montes DOB: 11/06/1971  REASON FOR VISIT: follow up HISTORY FROM: patient  HISTORY OF PRESENT ILLNESS: Today 06/09/19   HISTORY Katrina Simpsonis 51 right-handed African-American female, accompanied by her mother, referred by her primary care physician Dr. Buelah Manis for evaluation of right optic neuritis, abnormal MRI of the brain, suspicious for relapsing remitting multiple sclerosis, initial evaluation was on Jan 14 2015.  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  Relapsing Remitting Multiple Sclerosis was diagnosed since January 2016: Based on her abnormal MRI of the brain, there was also cervical spinal cord involvement  MRI of the brain without contrast at [facility] imaging January 2016 showed multiple periventricular white matter disease, significant involvement of corpus callosum, perpendicular to ventricle, consistent with multiple sclerosis, now was also suspicious for right optic nerve edema, with hyperintensity signal at T2, FLAIR, no contrast enhancement, MRA of brain was normal.  MRI of the cervical spine with and without contrast in Jan 2016: showing hyperintense foci within the spinal cord posteriorly at C4 and posteriolaterally at C7-T1 consistent with multiple sclerosis plaques. There were no enhancing foci.  Spinal fluid testing January 13 2015, total protein was 708, RBC was 1600, RBC was 7,, cloudy reddish-looking,elevated IgG 17.7, IgG index 0.97, IgG synthetic rate 238, 0  oligoclonal bands,  Visual evoked potential showed prolongation on the right side.  She was treated with IVSolu-Medrol for 3 daysin Jan2016, which onlymild improved her right vision, has significant worsening of her blood glucose level.She was treated with Achtar injection in Feb 2016,but still having significant residual visual difficulty.  JC virus was positive with titer of 1.1 3 (Jan 14 2015), laboratory evaluation showed normal or negative CPK, HIV, protein electrophoresis, positive varicella-zoster virus antibody, mild elevated ESR, C-reactive protein, hepatitis panel, Lyme titer,showed anemia, hemoglobin 11.8, otherwise normal CBC, normal CMP, with exception of elevated glucose, normal TSH,  She was treated withTysarbrilsinceMarch 14th, 2016,  Most recent repeat MRI of the brain with and without contrast in April 2019 showed multiple supratentorium lesions, no contrast enhancement, no significant change compared to previous MRI of the brain  She continues to have significant limitations  Vision:History of right optic neuritis, with partial recovery, now complains of blurry vision, difficult to read anything without bleeders or magnified glasses, difficult to read through computer screen or watch TV, visual acuity, OS 20/40, OD 20/50-1  Cognitive impairment:has to take frequent notes, difficulty to concentrate, to express herself.  Gait abnormality: Treated easily, generalized weakness, also complicated by her low back pain,leftsided radicular pain,  Spasticity:Bilateral lower extremity spasticity, is taking gabapentin 300 mg 3 times a day previously tried and failed baclofen  Fatigue: Severe fatigue, also complicated by her depression anxiety chronic insomnia, body achy pain, limit her daily activity more than 50%,she has heavy menstruation-like cycle, chronic anemia, recent hemoglobin was 10.6.   Chronic migraine: She also has history of chronic migraine  headaches, retro-orbital area severe pounding headache with associated light noise sensitivity, and happened frequently,up to 3 to  four times each week, Maxalt as needed was helpful,  Severe low back pain, radiating pain to left lower extremity, MRI of lumbar in July 201 there is minimum anterolisthesis of L4 upon L5, due to severe facet hypertrophy, associated with left ligamentum flavum hypertrophy and disc protrusion, there is moderate Left foraminal and lateral recess stenosis, potential for left L4-5 nerve root compression.  Diabetic peripheral neuropathy, Progressive worsening bilateral feet paresthesia, nowtaking insulin  Depression anxiety, chronic insomnia, polypharmacy treatment: Trazodone 50 mg every night, gabapentin 300 mg 3 times a day, Topamax 100 mg twice a day, Flexeril as needed  UPDATE Oct 21 2018: She is accompanied by her mother at today's clinical visit, constellation of complaints, generalized fatigue, left wrist pain, not feeling well, there is no MS flareup,  Recent JC virus titer was 1.3 in October 2019, she has been on [place] since March 2016, she would have 1 out of 1000chance of having PML infection,  After discussed with patient, with her constellation of complaints, significant lesion noted on MRI of brain, will proceed with ocrelizumab,  She also complains of new onset left wrist pain, tender to touch  UPDATE February 25, 2019 SS: She has been on Tysabri since March 2016, with elevated JC virus titer 1.3, at her last visit in November 2019 she agreed to be switched to [place].  She had her first Ocrevus infusion February 2020.  She had MRI of the brain in April 2019, did not show any enhancing lesions.  She reports sometimes she does not know how she should feel because she also has underlying hypertension and diabetes which have been out-of-control.  She is currently working with a wellness center to gain control of these conditons. She works as an in  Astronomer.   Weakness in hands-she reports continued weakness in both hands but more on the right, trouble opening things, dropping things.  She did see orthopedics for her left wrist and was given a cortisone injection and this is better.  Memory-she reports she is forgetful, has to write things down, has to concentrate  Bladder-she reports urinary frequency, thinks is related to hypertension/diabetes, she denies any incontinence  Balance-she reports she will often feel off balance, trip, denies any falls  Depression-she is tearful, hopes that she starts having more energy, feeling better as more time goes since her initial Ocrevus infusion, and is taking Effexor 150 mg daily.  Fatigue-she reports daily fatigue, is having a sleep study soon, takes trazodone 50 mg at bedtime, was told that she snores  Hearing Loss-she reports occasional ringing in her ears, notices her hearing is not as good as it used to be, having to read lips, this is embarrassing to her  She recently got Medicare and will be seeing her primary care doctor, Dr. Buelah Manis soon for a physical.  Prior that she had health department.  She does report some blurry vision however she saw an eye doctor recently and she will be getting new glasses soon.  Update June 09, 2019 SS: Had sleep evaluation, showed mild OSA, recommended to start using Auto PAP.  She had her first Ocrevus infusion in February 2020.  She says she has noticed an improvement in her fatigue since Ocrevus, next infusion August 2020.  She had EMG June 2020 showing moderate to severe right nerve median nerve entrapment at the wrist (carpal tunnel syndrome) affecting sensory and motor components done by Dr. Ernestina Patches, constant pain in the right arm, causing her not to sleep at  night.  She has follow-up appointment tomorrow to discuss results.  She is tearful, reports her depression is worse, discouraged about her right arm pain, she has gained weight, has been  isolated due to COVID-19, having to stay indoors due to high risk having MS. she is taking Effexor XR 150 mg daily.  Continues to have pain to her left hip/lower back when going up and down stairs, she has been forced to use a cane.  She does not have any problems otherwise ambulating.   She recently got new glasses, had an eye exam several months ago.  Labs in May 2020 at PCP (CBC, CMP, A1c, lipids, vitamin D) A1c 9.3, total cholesterol 252, LDL 146, vitamin D low 23, started supplementation   REVIEW OF SYSTEMS: Out of a complete 14 system review of symptoms, the patient complains only of the following symptoms, and all other reviewed systems are negative.  Hip pain, depression, weight gain, arm pain  , ALLERGIES: No Known Allergies  HOME MEDICATIONS: Outpatient Medications Prior to Visit  Medication Sig Dispense Refill   Blood Glucose Monitoring Suppl (BLOOD GLUCOSE SYSTEM PAK) KIT Please dispense as Guide Me System. Use as directed to monitor FSBS 3x daily. Dx: E11.9. 100 each 3   cyclobenzaprine (FLEXERIL) 10 MG tablet TAKE 1 Tablet BY MOUTH 4 TIMES DAILY AS NEEDED  3   DiphenhydrAMINE HCl (BENADRYL ALLERGY PO) Take by mouth as needed.     docusate sodium (COLACE) 50 MG capsule Take 1 capsule (50 mg total) by mouth 2 (two) times daily. 60 capsule 11   gabapentin (NEURONTIN) 300 MG capsule Take 1 capsule (300 mg total) by mouth 3 (three) times daily. 90 capsule 11   Glucose Blood (BLOOD GLUCOSE TEST STRIPS) STRP Please dispense as Guide Me System. Use as directed to monitor FSBS 3x daily. Dx: E11.9. 100 each 3   ibuprofen (ADVIL,MOTRIN) 200 MG tablet Take 200 mg by mouth every 6 (six) hours as needed.     Insulin Glargine (BASAGLAR KWIKPEN) 100 UNIT/ML SOPN INJECT 20 UNITS UNDER THE SKIN EVERY NIGHT 10 pen 0   JANUMET 50-500 MG tablet Take 1 tablet by mouth 2 (two) times daily. 60 tablet 0   Lancets MISC Please dispense as Guide Me System. Use as directed to monitor FSBS 3x  daily. Dx: E11.9. 100 each 3   lisinopril-hydrochlorothiazide (PRINZIDE,ZESTORETIC) 20-12.5 MG tablet Take 1 tablet by mouth daily. 30 tablet 0   meclizine (ANTIVERT) 25 MG tablet Take 1 tablet (25 mg total) by mouth 3 (three) times daily as needed for dizziness. 30 tablet 6   meloxicam (MOBIC) 15 MG tablet Take 1 tablet (15 mg total) by mouth daily. 90 tablet 1   Multiple Vitamin (MULTIVITAMIN) capsule Take 1 capsule by mouth daily.     norgestimate-ethinyl estradiol (ORTHO-CYCLEN,SPRINTEC,PREVIFEM) 0.25-35 MG-MCG tablet Take 1 tablet by mouth daily. 1 Package 11   ocrelizumab (OCREVUS) 300 MG/10ML injection Inject into the vein every 6 (six) months.     ocrelizumab 600 mg in sodium chloride 0.9 % 500 mL Inject 600 mg into the vein every 6 (six) months.     rizatriptan (MAXALT) 10 MG tablet Take 1 tablet (10 mg total) by mouth as needed for migraine. May repeat in 2 hours if needed. Max dose: 2/24 hr or 15/30 days. 45 tablet 3   topiramate (TOPAMAX) 100 MG tablet Take 1 tablet (100 mg total) by mouth 2 (two) times daily. 180 tablet 3   traZODone (DESYREL) 50 MG tablet Take  1 tablet (50 mg total) by mouth at bedtime. 30 tablet 6   venlafaxine XR (EFFEXOR-XR) 75 MG 24 hr capsule Take 2 capsules (150 mg total) by mouth daily with breakfast. 180 capsule 3   Vitamin D, Ergocalciferol, (DRISDOL) 1.25 MG (50000 UT) CAPS capsule Take 1 capsule (50,000 Units total) by mouth every 7 (seven) days. 4 capsule 0   No facility-administered medications prior to visit.     PAST MEDICAL HISTORY: Past Medical History:  Diagnosis Date   Anemia    Anxiety    Back pain    Carpal tunnel syndrome    Constipation    Depression    Diabetes mellitus    Hypertension    Joint pain    Lactose intolerance    Multiple sclerosis (HCC)    Optic neuritis    Shortness of breath    Swallowing difficulty    Swelling of both lower extremities    Vision, loss, sudden    Vitamin D deficiency      PAST SURGICAL HISTORY: Past Surgical History:  Procedure Laterality Date   BREAST REDUCTION SURGERY     CYST EXCISION     ovarian cyst removed     TUBAL LIGATION     WISDOM TOOTH EXTRACTION      FAMILY HISTORY: Family History  Problem Relation Age of Onset   Cancer Mother    Diabetes Mother    Hearing loss Mother    Thyroid nodules Mother    Anemia Mother    Kidney disease Mother    Hypertension Mother    Obesity Mother    Hyperlipidemia Father    High blood pressure Father    Sleep apnea Father    Anemia Sister    Arthritis Maternal Grandmother    Arthritis Maternal Grandfather    Arthritis Paternal Grandmother    Kidney disease Paternal Grandmother    Hypertension Paternal Grandmother    Arthritis Paternal Grandfather    Vision loss Paternal Grandfather    Stroke Paternal Grandfather    Hyperlipidemia Paternal Grandfather    Anemia Sister     SOCIAL HISTORY: Social History   Socioeconomic History   Marital status: Single    Spouse name: Not on file   Number of children: 0   Years of education: College   Highest education level: Not on file  Occupational History   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  Social Needs   Financial resource strain: Not on file   Food insecurity    Worry: Not on file    Inability: Not on file   Transportation needs    Medical: Not on file    Non-medical: Not on file  Tobacco Use   Smoking status: Never Smoker   Smokeless tobacco: Never Used  Substance and Sexual Activity   Alcohol use: No   Drug use: No   Sexual activity: Not Currently    Birth control/protection: Pill  Lifestyle   Physical activity    Days per week: Not on file    Minutes per session: Not on file   Stress: Not on file  Relationships   Social connections    Talks on phone: Not on file    Gets together: Not on file    Attends religious service: Not on file    Active member of club or organization: Not on file     Attends meetings of clubs or organizations: Not on file    Relationship status: Not on file   Intimate partner  violence    Fear of current or ex partner: Not on file    Emotionally abused: Not on file    Physically abused: Not on file    Forced sexual activity: Not on file  Other Topics Concern   Not on file  Social History Narrative   Live at home with parents.   Right handed.   Uses caffeine sparingly.      PHYSICAL EXAM  There were no vitals filed for this visit. There is no height or weight on file to calculate BMI.  Generalized: Well developed, in no acute distress   Neurological examination  Mentation: Alert oriented to time, place, history taking. Follows all commands speech and language fluent, is tearful  Cranial nerve II-XII: Pupils were equal round reactive to light. Extraocular movements were full, visual field were full on confrontational test. Facial sensation and strength were normal. Uvula tongue midline. Head turning and shoulder shrug  were normal and symmetric. Motor: The motor testing reveals 4 over 5 strength of all 4 extremities, with the exception of 3/5 RUE. Good symmetric motor tone is noted throughout.  Sensory: Sensory testing is intact to soft touch on all 4 extremities. No evidence of extinction is noted.  Coordination: Cerebellar testing reveals good finger-nose-finger and heel-to-shin bilaterally.  Gait and station: Gait is mildly unsteady. Tandem gait is unsteady. Reflexes: Deep tendon reflexes are symmetric and normal bilaterally.   DIAGNOSTIC DATA (LABS, IMAGING, TESTING) - I reviewed patient records, labs, notes, testing and imaging myself where available.  Lab Results  Component Value Date   WBC 7.3 05/14/2019   HGB 12.1 05/14/2019   HCT 37.6 05/14/2019   MCV 83.4 05/14/2019   PLT 373 05/14/2019      Component Value Date/Time   NA 138 05/14/2019 1228   NA 139 02/25/2019 0833   K 4.7 05/14/2019 1228   CL 105 05/14/2019 1228    CO2 21 05/14/2019 1228   GLUCOSE 204 (H) 05/14/2019 1228   BUN 14 05/14/2019 1228   BUN 9 02/25/2019 0833   CREATININE 0.91 05/14/2019 1228   CALCIUM 10.2 05/14/2019 1228   PROT 7.9 05/14/2019 1228   PROT 7.0 02/25/2019 0833   ALBUMIN 3.9 02/25/2019 0833   AST 12 05/14/2019 1228   ALT 17 05/14/2019 1228   ALKPHOS 70 02/25/2019 0833   BILITOT 0.4 05/14/2019 1228   BILITOT 0.3 02/25/2019 0833   GFRNONAA 104 02/25/2019 0833   GFRAA 120 02/25/2019 0833   Lab Results  Component Value Date   CHOL 252 (H) 05/14/2019   HDL 77 05/14/2019   LDLCALC 146 (H) 05/14/2019   TRIG 159 (H) 05/14/2019   CHOLHDL 3.3 05/14/2019   Lab Results  Component Value Date   HGBA1C 9.3 (H) 05/14/2019   No results found for: VITAMINB12 Lab Results  Component Value Date   TSH 1.050 10/21/2018      ASSESSMENT AND PLAN 48 y.o. year old female  has a past medical history of Anemia, Anxiety, Back pain, Carpal tunnel syndrome, Constipation, Depression, Diabetes mellitus, Hypertension, Joint pain, Lactose intolerance, Multiple sclerosis ([place]), Optic neuritis, Shortness of breath, Swallowing difficulty, Swelling of both lower extremities, Vision, loss, sudden, and Vitamin D deficiency. here with:  1.  Multiple sclerosis 2.  Depression 3.  Right arm/wrist pain  -Ocrevus next infusion August 2020, labs done May 2020 at PCP -Depression, taking Effexor XR 150 mg daily, she did not wish to adjust her regimen or add a new pill, please discuss with primary  care -She had an EMG done, will have an appointment to get the results, shows moderate to severe right median nerve entrapment at the wrist affecting sensory and motor components -Sleep study showed mild OSA, will be set up for a AUTO Pap, she is waiting for this to be delivered -Chronic left hip pain, back pain, Dr. Krista Blue ordered x-ray in 2017 was normal, MRI of lumbar spine in 2017, showed severe facet hypertrophy and disc protrusion, moderately severe left  foraminal and lateral recess stenosis potential for left L4 or L5 nerve root compression, Dr. Krista Blue reviewed MRI, we will continue to monitor -She continues to work with her PCP to gain control of her diabetes, going to the weight loss center -MRI of the brain last done April 2019 showed chronic demyelinating plaques, none were acute, no change from 2018 -She continues to complain of some hearing loss, ringing in her ears, she was referred to ENT at last visit, was right before COVID, I will provide another referral -She will follow-up in 6 months with Dr. Krista Blue or sooner if needed, at next appointment may need to address her depression issues, she didn't want to make any changes at this time.  I spent 25 minutes with the patient. 50% of this time was spent discussing her plan of care.    Butler Denmark, AGNP-C, DNP 06/09/2019, 7:40 AM [facility] [address], [place], [place] [address] [phone]

## 2019-06-10 ENCOUNTER — Ambulatory Visit (INDEPENDENT_AMBULATORY_CARE_PROVIDER_SITE_OTHER): Payer: Medicare Other | Admitting: Orthopaedic Surgery

## 2019-06-10 ENCOUNTER — Other Ambulatory Visit: Payer: Self-pay

## 2019-06-10 ENCOUNTER — Encounter: Payer: Self-pay | Admitting: Orthopaedic Surgery

## 2019-06-10 DIAGNOSIS — G5601 Carpal tunnel syndrome, right upper limb: Secondary | ICD-10-CM | POA: Diagnosis not present

## 2019-06-10 NOTE — Progress Notes (Signed)
Office Visit Note   Patient: Katrina Montes           Date of Birth: Dec 24, 1970           MRN: [id number] Visit Date: 06/10/2019              Requested by: Salley Scarlet, MD [address] [place],  [place] [address] PCP: Salley Scarlet, MD   Assessment & Plan: Visit Diagnoses:  1. Right carpal tunnel syndrome     Plan: Impression is right moderate to severe carpal tunnel syndrome.  Based on these findings I have recommended surgical release.  We discussed risks and benefits of the surgery as well as reasonable outcomes for pain relief and hand function.  At this point we will have to wait until her A1c level is near or below 8.0 in order to minimize the risk of surgical infection.  She understands this and will give [place] a call when she has had a new A1c level.  She just recently had one on 05/14/2019 which was 9.3.  Follow-Up Instructions: Return if symptoms worsen or fail to improve.   Orders:  No orders of the defined types were placed in this encounter.  No orders of the defined types were placed in this encounter.     Procedures: No procedures performed   Clinical Data: No additional findings.   Subjective: Chief Complaint  Patient presents with  . Follow-up    EMG/NCV review    Katrina Montes comes in today for review of her nerve conduction studies.  These showed that she had moderate to severe right carpal tunnel syndrome.  She denies any changes in her medical history.  The prednisone Dosepak gave her temporary relief.   Review of Systems  Constitutional: Negative.   HENT: Negative.   Eyes: Negative.   Respiratory: Negative.   Cardiovascular: Negative.   Endocrine: Negative.   Musculoskeletal: Negative.   Neurological: Negative.   Hematological: Negative.   Psychiatric/Behavioral: Negative.   All other systems reviewed and are negative.    Objective: Vital Signs: There were no vitals taken for this visit.  Physical Exam Vitals signs and  nursing note reviewed.  Constitutional:      Appearance: She is well-developed.  HENT:     Head: Normocephalic and atraumatic.  Neck:     Musculoskeletal: Neck supple.  Pulmonary:     Effort: Pulmonary effort is normal.  Abdominal:     Palpations: Abdomen is soft.  Skin:    General: Skin is warm.     Capillary Refill: Capillary refill takes less than 2 seconds.  Neurological:     Mental Status: She is alert and oriented to person, place, and time.  Psychiatric:        Behavior: Behavior normal.        Thought Content: Thought content normal.        Judgment: Judgment normal.     Ortho Exam Right hand exam is unchanged. Specialty Comments:  No specialty comments available.  Imaging: No results found.   PMFS History: Patient Active Problem List   Diagnosis Date Noted  . OSA (obstructive sleep apnea) 06/04/2019  . Class 3 severe obesity with serious comorbidity and body mass index (BMI) of 40.0 to 44.9 in adult (HCC) 03/25/2019  . De Quervain's disease (radial styloid tenosynovitis) 11/06/2018  . Gait abnormality 04/04/2018  . Depression 01/29/2017  . Migraine without aura and without status migrainosus, not intractable 03/30/2015  . Left lumbar  radiculopathy 03/09/2015  . Right optic neuritis 01/14/2015  . Multiple sclerosis ([place]) 01/14/2015  . Gastroenteritis 09/01/2014  . OVARIAN CYST 02/09/2011  . Morbid obesity ([place]) 10/24/2010  . Diabetes mellitus type II, uncontrolled ([place]) 09/22/2010  . Vitamin D deficiency 09/22/2010  . ANEMIA-IRON DEFICIENCY 09/22/2010  . Essential hypertension 09/22/2010   Past Medical History:  Diagnosis Date  . Anemia   . Anxiety   . Back pain   . Carpal tunnel syndrome   . Constipation   . Depression   . Diabetes mellitus   . Hypertension   . Joint pain   . Lactose intolerance   . Multiple sclerosis ([place])   . Optic neuritis   . Shortness of breath   . Swallowing difficulty   . Swelling of both lower extremities   .  Vision, loss, sudden   . Vitamin D deficiency     Family History  Problem Relation Age of Onset  . Cancer Mother   . Diabetes Mother   . Hearing loss Mother   . Thyroid nodules Mother   . Anemia Mother   . Kidney disease Mother   . Hypertension Mother   . Obesity Mother   . Hyperlipidemia Father   . High blood pressure Father   . Sleep apnea Father   . Anemia Sister   . Arthritis Maternal Grandmother   . Arthritis Maternal Grandfather   . Arthritis Paternal Grandmother   . Kidney disease Paternal Grandmother   . Hypertension Paternal Grandmother   . Arthritis Paternal Grandfather   . Vision loss Paternal Grandfather   . Stroke Paternal Grandfather   . Hyperlipidemia Paternal Grandfather   . Anemia Sister     Past Surgical History:  Procedure Laterality Date  . BREAST REDUCTION SURGERY    . CYST EXCISION    . ovarian cyst removed    . TUBAL LIGATION    . WISDOM TOOTH EXTRACTION     Social History   Occupational History  . 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 Smoking status: Never Smoker  . Smokeless tobacco: Never Used  Substance and Sexual Activity  . Alcohol use: No  . Drug use: No  . Sexual activity: Not Currently    Birth control/protection: Pill

## 2019-06-10 NOTE — Progress Notes (Signed)
I have reviewed and agreed above plan. 

## 2019-06-20 ENCOUNTER — Encounter: Payer: Self-pay | Admitting: Family Medicine

## 2019-06-20 ENCOUNTER — Other Ambulatory Visit: Payer: Self-pay | Admitting: Family Medicine

## 2019-06-20 DIAGNOSIS — Z794 Long term (current) use of insulin: Secondary | ICD-10-CM

## 2019-06-20 DIAGNOSIS — I10 Essential (primary) hypertension: Secondary | ICD-10-CM

## 2019-06-20 DIAGNOSIS — E119 Type 2 diabetes mellitus without complications: Secondary | ICD-10-CM

## 2019-06-23 MED ORDER — JANUMET 50-500 MG PO TABS: 1.0000 | ORAL_TABLET | Freq: Two times a day (BID) | ORAL | 1 refills | Status: DC

## 2019-06-23 MED ORDER — LISINOPRIL-HYDROCHLOROTHIAZIDE 20-12.5 MG PO TABS: 1.0000 | ORAL_TABLET | Freq: Every day | ORAL | 1 refills | Status: DC

## 2019-07-11 ENCOUNTER — Other Ambulatory Visit: Payer: Medicare Other

## 2019-07-11 DIAGNOSIS — R6889 Other general symptoms and signs: Secondary | ICD-10-CM | POA: Diagnosis not present

## 2019-07-11 DIAGNOSIS — Z20822 Contact with and (suspected) exposure to covid-19: Secondary | ICD-10-CM

## 2019-07-14 LAB — NOVEL CORONAVIRUS, NAA: SARS-CoV-2, NAA: NOT DETECTED

## 2019-07-31 DIAGNOSIS — G35 Multiple sclerosis: Secondary | ICD-10-CM | POA: Diagnosis not present

## 2019-08-21 ENCOUNTER — Ambulatory Visit: Payer: Self-pay | Admitting: Neurology

## 2019-08-22 ENCOUNTER — Other Ambulatory Visit (INDEPENDENT_AMBULATORY_CARE_PROVIDER_SITE_OTHER): Payer: Self-pay | Admitting: Bariatrics

## 2019-08-22 DIAGNOSIS — E119 Type 2 diabetes mellitus without complications: Secondary | ICD-10-CM

## 2019-08-22 DIAGNOSIS — Z794 Long term (current) use of insulin: Secondary | ICD-10-CM

## 2019-08-26 DIAGNOSIS — H401111 Primary open-angle glaucoma, right eye, mild stage: Secondary | ICD-10-CM | POA: Diagnosis not present

## 2019-09-17 ENCOUNTER — Encounter: Payer: Self-pay | Admitting: Family Medicine

## 2019-09-17 ENCOUNTER — Ambulatory Visit (INDEPENDENT_AMBULATORY_CARE_PROVIDER_SITE_OTHER): Payer: Medicare Other | Admitting: Family Medicine

## 2019-09-17 ENCOUNTER — Other Ambulatory Visit: Payer: Self-pay

## 2019-09-17 VITALS — BP 138/82 | HR 78 | Temp 98.0°F | Resp 16 | Ht 66.5 in | Wt 286.0 lb

## 2019-09-17 DIAGNOSIS — Z113 Encounter for screening for infections with a predominantly sexual mode of transmission: Secondary | ICD-10-CM

## 2019-09-17 DIAGNOSIS — G5601 Carpal tunnel syndrome, right upper limb: Secondary | ICD-10-CM | POA: Diagnosis not present

## 2019-09-17 DIAGNOSIS — Z23 Encounter for immunization: Secondary | ICD-10-CM | POA: Diagnosis not present

## 2019-09-17 DIAGNOSIS — R87610 Atypical squamous cells of undetermined significance on cytologic smear of cervix (ASC-US): Secondary | ICD-10-CM | POA: Diagnosis not present

## 2019-09-17 DIAGNOSIS — Z124 Encounter for screening for malignant neoplasm of cervix: Secondary | ICD-10-CM

## 2019-09-17 DIAGNOSIS — Z0001 Encounter for general adult medical examination with abnormal findings: Secondary | ICD-10-CM | POA: Diagnosis not present

## 2019-09-17 DIAGNOSIS — Z1239 Encounter for other screening for malignant neoplasm of breast: Secondary | ICD-10-CM

## 2019-09-17 DIAGNOSIS — I10 Essential (primary) hypertension: Secondary | ICD-10-CM | POA: Diagnosis not present

## 2019-09-17 DIAGNOSIS — E1165 Type 2 diabetes mellitus with hyperglycemia: Secondary | ICD-10-CM | POA: Diagnosis not present

## 2019-09-17 DIAGNOSIS — Z0000 Encounter for general adult medical examination without abnormal findings: Secondary | ICD-10-CM

## 2019-09-17 DIAGNOSIS — G5603 Carpal tunnel syndrome, bilateral upper limbs: Secondary | ICD-10-CM | POA: Insufficient documentation

## 2019-09-17 LAB — WET PREP FOR TRICH, YEAST, CLUE

## 2019-09-17 MED ORDER — BOOSTRIX 5-2.5-18.5 LF-MCG/0.5 IM SUSP: 0.5000 mL | Freq: Once | INTRAMUSCULAR | 0 refills | Status: DC

## 2019-09-17 MED ORDER — GABAPENTIN 300 MG PO CAPS: ORAL_CAPSULE | ORAL | 11 refills | Status: DC

## 2019-09-17 NOTE — Progress Notes (Signed)
Subjective:   Patient presents for Medicare Annual/Subsequent preventive examination.    DM - on basaglar 25 units   , janumet   , last A1C 9.3% , she did not bring her meter, CBG this morning was 152  the highest has been  162     Hyperlipidemia  - due for recheck   HTN- taking Bp meds as prescribed no SE    MS- she had a fall 2 weeks ago, no injury     Seen by orthopedics for hand pain- she had nerve conduction which showed severe carpal tunnel. They recommneded surgical release. Steroid shot could no tb egiven due to diabets   she has been wearing wrist brace She has been taking gabapentin 300mg  TID Also taking tylenol and ibuprofen  For significant pain has not had any relief.   [facility] [place]    Menses regular last 2 weeks ago due for PAP Smear, sexually active 1 partner   Review Past Medical/Family/Social: Per EMR   Risk Factors  Current exercise habits: none Dietary issues discussed:  YES  Cardiac risk factors: Obesity (BMI >= 30 kg/m2). HTN, dm   Depression Screen  (Note: if answer to either of the following is "Yes", a more complete depression screening is indicated)  Over the past two weeks, have you felt down, depressed or hopeless? No Over the past two weeks, have you felt little interest or pleasure in doing things? No Have you lost interest or pleasure in daily life? No Do you often feel hopeless? No Do you cry easily over simple problems? No   Activities of Daily Living  In your present state of health, do you have any difficulty performing the following activities?:  Driving? No  Managing money? No  Feeding yourself? No  Getting from bed to chair? No  Climbing a flight of stairs? No  Preparing food and eating?: No  Bathing or showering? No  Getting dressed: No  Getting to the toilet? No  Using the toilet:No  Moving around from place to place: Sometimes   In the past year have you fallen or had a near fall?:Yes  Are you sexually active?  Yes Do you have more than one partner? No   Hearing Difficulties: No  Do you often ask people to speak up or repeat themselves? No  Do you experience ringing or noises in your ears? No Do you have difficulty understanding soft or whispered voices? No  Do you feel that you have a problem with memory? No Do you often misplace items? No  Do you feel safe at home? Yes  Cognitive Testing  Alert? Yes Normal Appearance?Yes  Oriented to person? Yes Place? Yes  Time? Yes  Recall of three objects? Yes  Can perform simple calculations? Yes  Displays appropriate judgment?Yes  Can read the correct time from a watch face?Yes   List the Names of Other Physician/Practitioners you currently use:   NEUROLOGY  Optometry-   Screening Tests / Date                  Mammogram Due  PAP Smear  DUE Influenza Vaccine  Due  Tetanus/tdap Due  ROS: GEN- denies fatigue, fever, weight loss,weakness, recent illness HEENT- denies eye drainage, change in vision, nasal discharge, CVS- denies chest pain, palpitations RESP- denies SOB, cough, wheeze ABD- denies N/V, change in stools, abd pain GU- denies dysuria, hematuria, dribbling, incontinence MSK- denies joint pain, muscle aches, injury Neuro- denies headache, dizziness, syncope,  seizure activity  Physical: Vitals reviewed  GEN- NAD, alert and oriented x3 HEENT- PERRL, EOMI, non injected sclera, pink conjunctiva, MMM, oropharynx clear Neck- Supple, no thryomegaly Breast- normal symmetry, no nipple inversion,no nipple drainage, no nodules or lumps felt Nodes- no axillary nodes CVS- RRR, no murmur RESP-CTAB ABD-NABS,soft,NT,ND GU- normal external genitalia, vaginal mucosa pink and moist, cervix visualized no growth, mild blood form os, minimal thin clear discharge, no CMT, no ovarian masses, uterus normal size EXT- No edema Pulses- Radial, DP- 2+    Assessment:    Annual wellness medicare exam   Plan:    During the course of the visit the  patient was educated and counseled about appropriate screening and preventive services including:  Screening mammography - pt to schedule  PAP SMEAR- Done today along with STD screening  DM- recheck A1C goal < 7% but if less than 8% will sign off for carpal tunnel surgery  continue Janumet and basaglar 25units  Hyperlipidemia- recheck lipids  Immunizations- Flu shot given, TDAP sent to pharmacy    Higher fall risk due to underlying MS, pt tries to take precautions at home  Carpal tunnel- increase gabapentin to 600 mg at bedtime continue 300 mg twice daily during the day.  She basically need surgical intervention.  She is tried all the other conservative methods.    Hypertension blood pressure looks okay no changes in medication.       Diet review for nutrition referral? Yes ____ Not Indicated __x__  Patient Instructions (the written plan) was given to the patient.  Medicare Attestation  I have personally reviewed:  The patient's medical and social history  Their use of alcohol, tobacco or illicit drugs  Their current medications and supplements  The patient's functional ability including ADLs,fall risks, home safety risks, cognitive, and hearing and visual impairment  Diet and physical activities  Evidence for depression or mood disorders  The patient's weight, height, BMI, and visual acuity have been recorded in the chart. I have made referrals, counseling, and provided education to the patient based on review of the above and I have provided the patient with a written personalized care plan for preventive services.

## 2019-09-17 NOTE — Patient Instructions (Addendum)
F/U 3 months  Tetanus sent to pharmacy  FLu shot given  We will call with lab results Increase bedtime gabapentin to 600mg  , continue 300mg  in morning and mid day

## 2019-09-18 LAB — COMPREHENSIVE METABOLIC PANEL
AG Ratio: 1.2 (calc) (ref 1.0–2.5)
ALT: 13 U/L (ref 6–29)
AST: 21 U/L (ref 10–35)
Albumin: 4 g/dL (ref 3.6–5.1)
Alkaline phosphatase (APISO): 51 U/L (ref 31–125)
BUN: 11 mg/dL (ref 7–25)
CO2: 23 mmol/L (ref 20–32)
Calcium: 9.3 mg/dL (ref 8.6–10.2)
Chloride: 102 mmol/L (ref 98–110)
Creat: 0.83 mg/dL (ref 0.50–1.10)
Globulin: 3.4 g/dL (calc) (ref 1.9–3.7)
Glucose, Bld: 160 mg/dL — ABNORMAL HIGH (ref 65–99)
Potassium: 4.2 mmol/L (ref 3.5–5.3)
Sodium: 135 mmol/L (ref 135–146)
Total Bilirubin: 0.5 mg/dL (ref 0.2–1.2)
Total Protein: 7.4 g/dL (ref 6.1–8.1)

## 2019-09-18 LAB — CBC WITH DIFFERENTIAL/PLATELET
Absolute Monocytes: 630 cells/uL (ref 200–950)
Basophils Absolute: 7 cells/uL (ref 0–200)
Basophils Relative: 0.1 %
Eosinophils Absolute: 20 cells/uL (ref 15–500)
Eosinophils Relative: 0.3 %
HCT: 36.8 % (ref 35.0–45.0)
Hemoglobin: 11.8 g/dL (ref 11.7–15.5)
Lymphs Abs: 1307 cells/uL (ref 850–3900)
MCH: 26.9 pg — ABNORMAL LOW (ref 27.0–33.0)
MCHC: 32.1 g/dL (ref 32.0–36.0)
MCV: 84 fL (ref 80.0–100.0)
MPV: 9.9 fL (ref 7.5–12.5)
Monocytes Relative: 9.4 %
Neutro Abs: 4737 cells/uL (ref 1500–7800)
Neutrophils Relative %: 70.7 %
Platelets: 380 10*3/uL (ref 140–400)
RBC: 4.38 10*6/uL (ref 3.80–5.10)
RDW: 13.6 % (ref 11.0–15.0)
Total Lymphocyte: 19.5 %
WBC: 6.7 10*3/uL (ref 3.8–10.8)

## 2019-09-18 LAB — LIPID PANEL
Cholesterol: 187 mg/dL (ref <200)
HDL: 63 mg/dL (ref >=50)
LDL Cholesterol (Calc): 98 mg/dL (calc)
Non-HDL Cholesterol (Calc): 124 mg/dL (calc) (ref <130)
Total CHOL/HDL Ratio: 3 (calc) (ref <5.0)
Triglycerides: 166 mg/dL — ABNORMAL HIGH (ref <150)

## 2019-09-18 LAB — HEMOGLOBIN A1C
Hgb A1c MFr Bld: 9.1 % of total Hgb — ABNORMAL HIGH (ref <5.7)
Mean Plasma Glucose: 214 (calc)
eAG (mmol/L): 11.9 (calc)

## 2019-09-18 LAB — C. TRACHOMATIS/N. GONORRHOEAE RNA
C. trachomatis RNA, TMA: NOT DETECTED
N. gonorrhoeae RNA, TMA: NOT DETECTED

## 2019-09-19 ENCOUNTER — Other Ambulatory Visit: Payer: Self-pay | Admitting: *Deleted

## 2019-09-19 DIAGNOSIS — E119 Type 2 diabetes mellitus without complications: Secondary | ICD-10-CM

## 2019-09-19 DIAGNOSIS — Z794 Long term (current) use of insulin: Secondary | ICD-10-CM

## 2019-09-19 MED ORDER — BASAGLAR KWIKPEN 100 UNIT/ML ~~LOC~~ SOPN: PEN_INJECTOR | SUBCUTANEOUS | 0 refills | Status: DC

## 2019-09-19 MED ORDER — METRONIDAZOLE 500 MG PO TABS: 500.0000 mg | ORAL_TABLET | Freq: Two times a day (BID) | ORAL | 0 refills | Status: DC

## 2019-09-23 LAB — HUMAN PAPILLOMAVIRUS, HIGH RISK: HPV DNA High Risk: NOT DETECTED

## 2019-09-23 LAB — PAP IG W/ RFLX HPV ASCU

## 2019-09-26 ENCOUNTER — Encounter: Payer: Self-pay | Admitting: *Deleted

## 2019-10-16 ENCOUNTER — Other Ambulatory Visit: Payer: Self-pay | Admitting: Family Medicine

## 2019-10-31 ENCOUNTER — Ambulatory Visit
Admission: RE | Admit: 2019-10-31 | Discharge: 2019-10-31 | Disposition: A | Discharge status: Home / Self Care | Payer: Medicare Other | Source: Ambulatory Visit | Attending: Family Medicine | Admitting: Family Medicine

## 2019-10-31 ENCOUNTER — Other Ambulatory Visit: Payer: Self-pay

## 2019-10-31 DIAGNOSIS — Z1239 Encounter for other screening for malignant neoplasm of breast: Secondary | ICD-10-CM

## 2019-10-31 DIAGNOSIS — Z1231 Encounter for screening mammogram for malignant neoplasm of breast: Secondary | ICD-10-CM | POA: Diagnosis not present

## 2019-11-20 ENCOUNTER — Other Ambulatory Visit: Payer: Self-pay

## 2019-11-20 DIAGNOSIS — Z794 Long term (current) use of insulin: Secondary | ICD-10-CM

## 2019-11-20 MED ORDER — JANUMET 50-500 MG PO TABS: 1.0000 | ORAL_TABLET | Freq: Two times a day (BID) | ORAL | 0 refills | Status: DC

## 2019-12-01 ENCOUNTER — Ambulatory Visit: Payer: Medicare Other | Admitting: Neurology

## 2019-12-13 ENCOUNTER — Other Ambulatory Visit: Payer: Self-pay | Admitting: Family Medicine

## 2019-12-13 DIAGNOSIS — I10 Essential (primary) hypertension: Secondary | ICD-10-CM

## 2019-12-17 ENCOUNTER — Ambulatory Visit: Payer: Medicare Other | Admitting: Family Medicine

## 2019-12-28 ENCOUNTER — Other Ambulatory Visit (INDEPENDENT_AMBULATORY_CARE_PROVIDER_SITE_OTHER): Payer: Self-pay | Admitting: Bariatrics

## 2019-12-28 DIAGNOSIS — Z794 Long term (current) use of insulin: Secondary | ICD-10-CM

## 2019-12-28 DIAGNOSIS — E119 Type 2 diabetes mellitus without complications: Secondary | ICD-10-CM

## 2020-01-05 ENCOUNTER — Ambulatory Visit (INDEPENDENT_AMBULATORY_CARE_PROVIDER_SITE_OTHER): Payer: Medicare Other | Admitting: Family Medicine

## 2020-01-05 ENCOUNTER — Encounter: Payer: Self-pay | Admitting: Family Medicine

## 2020-01-05 ENCOUNTER — Other Ambulatory Visit: Payer: Self-pay

## 2020-01-05 VITALS — BP 134/78 | HR 98 | Temp 98.4°F | Resp 14 | Ht 66.5 in | Wt 285.0 lb

## 2020-01-05 DIAGNOSIS — G35 Multiple sclerosis: Secondary | ICD-10-CM | POA: Diagnosis not present

## 2020-01-05 DIAGNOSIS — E1165 Type 2 diabetes mellitus with hyperglycemia: Secondary | ICD-10-CM | POA: Diagnosis not present

## 2020-01-05 DIAGNOSIS — G5601 Carpal tunnel syndrome, right upper limb: Secondary | ICD-10-CM

## 2020-01-05 DIAGNOSIS — F3289 Other specified depressive episodes: Secondary | ICD-10-CM

## 2020-01-05 DIAGNOSIS — E559 Vitamin D deficiency, unspecified: Secondary | ICD-10-CM

## 2020-01-05 DIAGNOSIS — I10 Essential (primary) hypertension: Secondary | ICD-10-CM | POA: Diagnosis not present

## 2020-01-05 DIAGNOSIS — Z6841 Body Mass Index (BMI) 40.0 and over, adult: Secondary | ICD-10-CM

## 2020-01-05 DIAGNOSIS — Z794 Long term (current) use of insulin: Secondary | ICD-10-CM | POA: Diagnosis not present

## 2020-01-05 DIAGNOSIS — E669 Obesity, unspecified: Secondary | ICD-10-CM | POA: Diagnosis not present

## 2020-01-05 DIAGNOSIS — E119 Type 2 diabetes mellitus without complications: Secondary | ICD-10-CM

## 2020-01-05 MED ORDER — BASAGLAR KWIKPEN 100 UNIT/ML ~~LOC~~ SOPN: PEN_INJECTOR | SUBCUTANEOUS | 3 refills | Status: DC

## 2020-01-05 MED ORDER — RIZATRIPTAN BENZOATE 10 MG PO TABS: 10.0000 mg | ORAL_TABLET | ORAL | 3 refills | Status: DC | PRN

## 2020-01-05 MED ORDER — HYDROCODONE-ACETAMINOPHEN 7.5-325 MG PO TABS: 1.0000 | ORAL_TABLET | Freq: Four times a day (QID) | ORAL | 0 refills | Status: DC | PRN

## 2020-01-05 NOTE — Assessment & Plan Note (Signed)
Blood pressure is controlled no change in medication.  Check her fasting labs today

## 2020-01-05 NOTE — Assessment & Plan Note (Addendum)
Goal is A1c less than 7%.  If she were to have surgical intervention her A1c would need to be less than 8% but at this time as she is helping care for her parents she cannot proceed with surgery for her hands. Continue Basaglar 28 units and Janumet.  We will adjust as needed

## 2020-01-05 NOTE — Progress Notes (Signed)
Subjective:    Patient ID: Katrina Montes, female    DOB: [birth year], 49 y.o.   MRN: [id number]  Patient presents for Follow-up (is fasting)  Patient here to follow-up chronic medical problems.  Medications reviewed  Diabetes mellitus last A1c in September was down to 9.1%.  Basaglar was increased to 28 units she was continued on Janumet cbg this am 140  Hyperlipidemia her lipids had improved at the last visit.  LDL was 98 triglycerides 166  Hypertension-taking lisinopril HCTZ as prescribed without any difficulty  Multiple sclerosis she is still followed by neurology no change to her medications  Major depressive disorder- she weaned herself off the effexor and the trazodone. She has been off for months does admit that she has had increased stress.  She is in pain all the time between her MS and her bilateral hands.  Feels like no one is listening to her.  She also has to do a lot for her family at home despite her illness her parents are elderly and she is cooking and cleaning and doing errands.  Severe hand pain, has known carpal tunnel, needs surgery but blood sugars have been   She is dropping items which frustrating  She wears the hand braces   she takes gabapentin, mobic/nsaid, tried hemp gummies  Review Of Systems:  GEN- denies fatigue, fever, weight loss,weakness, recent illness HEENT- denies eye drainage, change in vision, nasal discharge, CVS- denies chest pain, palpitations RESP- denies SOB, cough, wheeze ABD- denies N/V, change in stools, abd pain GU- denies dysuria, hematuria, dribbling, incontinence MSK- + joint pain, muscle aches, injury Neuro- denies headache, dizziness, syncope, seizure activity       Objective:    BP 134/78   Pulse 98   Temp 98.4 F (36.9 C) (Temporal)   Resp 14   Ht 5' 6.5" (1.689 m)   Wt 285 lb (129.3 kg)   LMP 12/16/2019 Comment: regular  SpO2 99%   BMI 45.31 kg/m  GEN- NAD, alert and oriented x3 HEENT- PERRL, EOMI, non  injected sclera, pink conjunctiva,  Neck- Supple, no thyromegaly CVS- RRR, no murmur RESP-CTAB ABD-NABS,soft,NT,ND Psych tearful affect not anxious appearing no suicidal ideations well-groomed good eye contact EXT- No edema Pulses- Radial, DP- 2+        Assessment & Plan:      Problem List Items Addressed This Visit      Unprioritized   Carpal tunnel syndrome of right wrist    Given her hydrocodone to use as needed for pain.  Hopefully this will help with overall functioning.  She will continue her gabapentin as well.  She also uses wrist brace.      Class 3 obesity   RESOLVED: Class 3 severe obesity with serious comorbidity and body mass index (BMI) of 40.0 to 44.9 in adult [facility])   Relevant Medications   Insulin Glargine (BASAGLAR KWIKPEN) 100 UNIT/ML SOPN   Depression    See if improving her pain will help with her overall mood.  If she is not getting any improvement in her mood she is going to call me back in a few weeks after we start the pain medication.  We will then consider putting her back on venlafaxine starting at 37.5 mg once a day      Diabetes mellitus type II, uncontrolled (HCC)    Goal is A1c less than 7%.  If she were to have surgical intervention her A1c would need to be less than 8% but at this  time as she is helping care for her parents she cannot proceed with surgery for her hands. Continue Basaglar 28 units and Janumet.  We will adjust as needed      Relevant Medications   Insulin Glargine (BASAGLAR KWIKPEN) 100 UNIT/ML SOPN   Other Relevant Orders   Hemoglobin A1c   Lipid Panel   Essential hypertension - Primary    Blood pressure is controlled no change in medication.  Check her fasting labs today      Relevant Orders   CBC with Differential   Comprehensive metabolic panel   Multiple sclerosis (HCC)    Followed by neurology.  She is due to have another infusion in about 2 weeks.      Vitamin D deficiency   Relevant Orders   Vitamin D,  25-hydroxy    Other Visit Diagnoses    Type 2 diabetes mellitus without complication, with long-term current use of insulin (HCC)       Relevant Medications   Insulin Glargine (BASAGLAR KWIKPEN) 100 UNIT/ML SOPN      Note: This dictation was prepared with Dragon dictation along with smaller phrase technology. Any transcriptional errors that result from this process are unintentional.

## 2020-01-05 NOTE — Assessment & Plan Note (Signed)
See if improving her pain will help with her overall mood.  If she is not getting any improvement in her mood she is going to call me back in a few weeks after we start the pain medication.  We will then consider putting her back on venlafaxine starting at 37.5 mg once a day

## 2020-01-05 NOTE — Assessment & Plan Note (Signed)
Given her hydrocodone to use as needed for pain.  Hopefully this will help with overall functioning.  She will continue her gabapentin as well.  She also uses wrist brace.

## 2020-01-05 NOTE — Assessment & Plan Note (Signed)
Followed by neurology.  She is due to have another infusion in about 2 weeks.

## 2020-01-05 NOTE — Patient Instructions (Signed)
F/U 4 months  

## 2020-01-06 LAB — CBC WITH DIFFERENTIAL/PLATELET
Absolute Monocytes: 600 cells/uL (ref 200–950)
Basophils Absolute: 38 cells/uL (ref 0–200)
Basophils Relative: 0.5 %
Eosinophils Absolute: 61 cells/uL (ref 15–500)
Eosinophils Relative: 0.8 %
HCT: 38.1 % (ref 35.0–45.0)
Hemoglobin: 12.1 g/dL (ref 11.7–15.5)
Lymphs Abs: 1459 cells/uL (ref 850–3900)
MCH: 26.3 pg — ABNORMAL LOW (ref 27.0–33.0)
MCHC: 31.8 g/dL — ABNORMAL LOW (ref 32.0–36.0)
MCV: 82.8 fL (ref 80.0–100.0)
MPV: 10.1 fL (ref 7.5–12.5)
Monocytes Relative: 7.9 %
Neutro Abs: 5442 cells/uL (ref 1500–7800)
Neutrophils Relative %: 71.6 %
Platelets: 344 10*3/uL (ref 140–400)
RBC: 4.6 10*6/uL (ref 3.80–5.10)
RDW: 13 % (ref 11.0–15.0)
Total Lymphocyte: 19.2 %
WBC: 7.6 10*3/uL (ref 3.8–10.8)

## 2020-01-06 LAB — COMPREHENSIVE METABOLIC PANEL
AG Ratio: 1.3 (calc) (ref 1.0–2.5)
ALT: 11 U/L (ref 6–29)
AST: 17 U/L (ref 10–35)
Albumin: 3.9 g/dL (ref 3.6–5.1)
Alkaline phosphatase (APISO): 54 U/L (ref 31–125)
BUN: 10 mg/dL (ref 7–25)
CO2: 23 mmol/L (ref 20–32)
Calcium: 9.1 mg/dL (ref 8.6–10.2)
Chloride: 105 mmol/L (ref 98–110)
Creat: 0.69 mg/dL (ref 0.50–1.10)
Globulin: 3.1 g/dL (calc) (ref 1.9–3.7)
Glucose, Bld: 193 mg/dL — ABNORMAL HIGH (ref 65–99)
Potassium: 4.6 mmol/L (ref 3.5–5.3)
Sodium: 137 mmol/L (ref 135–146)
Total Bilirubin: 0.4 mg/dL (ref 0.2–1.2)
Total Protein: 7 g/dL (ref 6.1–8.1)

## 2020-01-06 LAB — VITAMIN D 25 HYDROXY (VIT D DEFICIENCY, FRACTURES): Vit D, 25-Hydroxy: 25 ng/mL — ABNORMAL LOW (ref 30–100)

## 2020-01-06 LAB — HEMOGLOBIN A1C
Hgb A1c MFr Bld: 10.8 % of total Hgb — ABNORMAL HIGH (ref <5.7)
Mean Plasma Glucose: 263 (calc)
eAG (mmol/L): 14.6 (calc)

## 2020-01-06 LAB — LIPID PANEL
Cholesterol: 198 mg/dL (ref <200)
HDL: 65 mg/dL (ref >=50)
LDL Cholesterol (Calc): 105 mg/dL (calc) — ABNORMAL HIGH
Non-HDL Cholesterol (Calc): 133 mg/dL (calc) — ABNORMAL HIGH (ref <130)
Total CHOL/HDL Ratio: 3 (calc) (ref <5.0)
Triglycerides: 164 mg/dL — ABNORMAL HIGH (ref <150)

## 2020-01-07 ENCOUNTER — Telehealth: Payer: Self-pay | Admitting: *Deleted

## 2020-01-07 NOTE — Telephone Encounter (Signed)
Received prior authorization for Hydrocodone/.APAP.   Per [place] law, max of 7 day supply of opioids can be given for acute pain.   Advised pharmacy to give 7 day supply (28).   Call placed to patient and patient made aware. Advised her to contact office for full refill when needed.

## 2020-01-08 ENCOUNTER — Other Ambulatory Visit: Payer: Self-pay | Admitting: *Deleted

## 2020-01-19 ENCOUNTER — Encounter: Payer: Self-pay | Admitting: Neurology

## 2020-01-19 ENCOUNTER — Other Ambulatory Visit: Payer: Self-pay

## 2020-01-19 ENCOUNTER — Ambulatory Visit (INDEPENDENT_AMBULATORY_CARE_PROVIDER_SITE_OTHER): Payer: Medicare Other | Admitting: Neurology

## 2020-01-19 VITALS — BP 144/73 | HR 79 | Temp 97.3°F | Ht 66.5 in | Wt 283.5 lb

## 2020-01-19 DIAGNOSIS — E559 Vitamin D deficiency, unspecified: Secondary | ICD-10-CM | POA: Diagnosis not present

## 2020-01-19 DIAGNOSIS — G35 Multiple sclerosis: Secondary | ICD-10-CM

## 2020-01-19 DIAGNOSIS — G43009 Migraine without aura, not intractable, without status migrainosus: Secondary | ICD-10-CM

## 2020-01-19 DIAGNOSIS — F329 Major depressive disorder, single episode, unspecified: Secondary | ICD-10-CM | POA: Diagnosis not present

## 2020-01-19 DIAGNOSIS — R5383 Other fatigue: Secondary | ICD-10-CM

## 2020-01-19 DIAGNOSIS — R269 Unspecified abnormalities of gait and mobility: Secondary | ICD-10-CM

## 2020-01-19 DIAGNOSIS — H9193 Unspecified hearing loss, bilateral: Secondary | ICD-10-CM | POA: Diagnosis not present

## 2020-01-19 DIAGNOSIS — G4719 Other hypersomnia: Secondary | ICD-10-CM

## 2020-01-19 DIAGNOSIS — F32A Depression, unspecified: Secondary | ICD-10-CM

## 2020-01-19 MED ORDER — VENLAFAXINE HCL ER 37.5 MG PO CP24: 37.5000 mg | ORAL_CAPSULE | Freq: Every day | ORAL | 0 refills | Status: DC

## 2020-01-19 MED ORDER — VENLAFAXINE HCL ER 75 MG PO CP24: 75.0000 mg | ORAL_CAPSULE | Freq: Every day | ORAL | 11 refills | Status: DC

## 2020-01-19 NOTE — Progress Notes (Signed)
PATIENT: Katrina Montes DOB: Mar 07, 1971  Chief Complaint  Patient presents with  . Multiple Sclerosis    Reports still having difficulty using her hands due to numbness and pain. Her vision is more blurry.  Migraines are worse. Feels she needs to see ENT to have her hearing loss evaluated. Also, states she feels she had a relapse back in September 2020. She had body aches, fatigue, worseing numbness/tingling that lasting around three weeks. She had the same symptoms the following month, lasting around one week.     HISTORICAL Katrina Montes 44 right-handed African-American female, accompanied by her mother, referred by her primary care physician Dr. Buelah Montes for evaluation of right optic neuritis, abnormal MRI of the brain, suspicious for relapsing remitting multiple sclerosis, initial evaluation was on Jan 14 2015.  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  Relapsing Remitting Multiple Sclerosis was diagnosed since January 2016: Based on her abnormal MRI of the brain, there was also cervical spinal cord involvement  MRI of the brain without contrast at [facility] imaging January 2016 showed multiple periventricular white matter disease, significant involvement of corpus callosum, perpendicular to ventricle, consistent with multiple sclerosis, now was also suspicious for right optic nerve edema, with hyperintensity signal at T2, FLAIR, no contrast enhancement, MRA of brain was normal.  MRI of the cervical spine  with and without contrast in Jan 2016: showing hyperintense foci within the spinal cord posteriorly at C4 and posteriolaterally at C7-T1 consistent with multiple sclerosis plaques. There were no enhancing foci.  Spinal fluid testing January 13 2015, total protein was 708, RBC was 1600, RBC was 7,, cloudy reddish-looking,elevated IgG 17.7, IgG index 0.97, IgG synthetic rate 238, 0 oligoclonal bands,  Visual evoked potential showed prolongation on the right side.  She was treated with IVSolu-Medrol for 3 daysin Jan2016, which onlymild improved her right vision, has significant worsening of her blood glucose level.She was treated with Achtar injection in Feb 2016,but still having significant residual visual difficulty.  JC virus was positive with titer of 1.1 3 (Jan 14 2015), laboratory evaluation showed normal or negative CPK, HIV, protein electrophoresis, positive varicella-zoster virus antibody, mild elevated ESR, C-reactive protein, hepatitis panel, Lyme titer,showed anemia, hemoglobin 11.8, otherwise normal CBC, normal CMP, with exception of elevated glucose, normal TSH,  She was treated withTysarbrilsinceMarch 14th, 2016,was switched to ocrelizumab since February 2020  MRI of the brain with and without contrast in April 2019 showed multiple supratentorium lesions, no contrast enhancement, no significant change compared to previous MRI of the brain  Vision:History of right optic neuritis, with partial recovery, now complains of blurry vision, difficult to read anything without bleeders or magnified glasses, difficult to read through computer screen or watch TV, visual acuity, OS 20/40, OD 20/50-1  Cognitive impairment:has to take frequent notes, difficulty to concentrate, to express herself.  Gait abnormality: Treated easily, generalized weakness, also complicated by her low back pain,leftsided radicular pain,  Spasticity:Bilateral lower extremity spasticity, is  taking gabapentin 300 mg 3 times a day  previously tried and failed baclofen  Fatigue: Severe fatigue, also complicated by her depression anxiety chronic insomnia, body achy pain, limit her daily activity more than 50%,she has heavy menstruation-like cycle, chronic anemia, recent hemoglobin was 10.6.  Chronic migraine: She also has history of chronic migraine headaches, retro-orbital area severe pounding headache with associated light noise sensitivity, and happened frequently,up to 3 to four times each week, Maxalt as needed was helpful,  Severe low back pain, radiating pain to left lower extremity, MRI of lumbar in July 201 there is minimum anterolisthesis of L4 upon L5, due to severe facet hypertrophy, associated with left ligamentum flavum hypertrophy and disc protrusion, there is moderate Left foraminal and lateral recess stenosis, potential for left L4-5 nerve root compression.  Diabetic peripheral neuropathy, Progressive worsening bilateral feet paresthesia, nowtaking insulin  Depression anxiety, chronic insomnia, polypharmacy treatment: Trazodone 50 mg every night, gabapentin 300 mg 3 times a day, Topamax 100 mg twice a day, Flexeril as needed  Weakness in hands: EMG nerve conduction study from outside clinic confirmed the diagnosis of severe right median neuropathy,  Urinary frequency: stable   Hearing Loss-she reports occasional ringing in her ears, notices her hearing is not as good as it used to be, having to read lips, this is embarrassing to her  Mild obstructive sleep apnea, using CPAP machine,  Vitamin D deficiency: Vitamin D level was 23, on supplement  REVIEW OF SYSTEMS: Full 14 system review of systems performed and notable only for as above All other review of systems were negative.  ALLERGIES: No Known Allergies  HOME MEDICATIONS: Current Outpatient Medications  Medication Sig Dispense Refill  . APPLE CIDER VINEGAR PO Take 1 tablet by mouth 2 (two)  times daily.    . Blood Glucose Monitoring Suppl (BLOOD GLUCOSE SYSTEM PAK) KIT Please dispense as Guide Me System. Use as directed to monitor FSBS 3x daily. Dx: E11.9. 100 each 3  . cyclobenzaprine (FLEXERIL) 10 MG tablet TAKE 1 Tablet BY MOUTH 4 TIMES DAILY AS NEEDED  3  . DiphenhydrAMINE HCl (BENADRYL ALLERGY PO) Take by mouth as needed.    . docusate sodium (COLACE) 50 MG capsule Take 1 capsule (50 mg total) by mouth 2 (two) times daily. 60 capsule 11  . ELDERBERRY PO Take 1,150 mg by mouth daily.    Marland Kitchen gabapentin (NEURONTIN) 300 MG capsule Take '300mg'$  BID and '600mg'$  at bedtime 120 capsule 11  . Glucose Blood (BLOOD GLUCOSE TEST STRIPS) STRP Please dispense as Guide Me System. Use as directed to monitor FSBS 3x daily. Dx: E11.9. 100 each 3  . HYDROcodone-acetaminophen (NORCO) 7.5-325 MG tablet Take 1 tablet by mouth every 6 (six) hours as needed for moderate pain. 45 tablet 0  . ibuprofen (ADVIL,MOTRIN) 200 MG tablet Take 200 mg by mouth every 6 (six) hours as needed.    . Insulin Glargine (BASAGLAR KWIKPEN) 100 UNIT/ML SOPN INJECT 25-35 UNITS UNDER THE SKIN EVERY NIGHT 5 pen 3  . JANUMET 50-500 MG tablet Take 1 tablet by mouth 2 (two) times daily. 180 tablet 0  . Lancets MISC Please dispense as Guide Me System. Use as directed to monitor FSBS 3x daily. Dx: E11.9. 100 each 3  . lisinopril-hydrochlorothiazide (ZESTORETIC) 20-12.5 MG tablet TAKE 1 TABLET BY MOUTH DAILY 90 tablet 1  . meclizine (ANTIVERT) 25 MG tablet Take 1 tablet (25 mg total) by mouth 3 (three) times daily as needed for dizziness. 30 tablet 6  . Multiple Vitamin (MULTIVITAMIN) capsule Take 1 capsule by mouth daily.    Marland Kitchen  norgestimate-ethinyl estradiol (ORTHO-CYCLEN,SPRINTEC,PREVIFEM) 0.25-35 MG-MCG tablet Take 1 tablet by mouth daily. 1 Package 11  . ocrelizumab (OCREVUS) 300 MG/10ML injection Inject into the vein every 6 (six) months.    . rizatriptan (MAXALT) 10 MG tablet Take 1 tablet (10 mg total) by mouth as needed for  migraine. May repeat in 2 hours if needed. Max dose: 2/24 hr or 15/30 days. 45 tablet 3  . topiramate (TOPAMAX) 100 MG tablet Take 1 tablet (100 mg total) by mouth 2 (two) times daily. 180 tablet 3  . VITAMIN D PO Take 5,000 Units by mouth daily.     No current facility-administered medications for this visit.    PAST MEDICAL HISTORY: Past Medical History:  Diagnosis Date  . Anemia   . Anxiety   . Back pain   . Carpal tunnel syndrome   . Constipation   . Depression   . Diabetes mellitus   . Hypertension   . Joint pain   . Lactose intolerance   . Multiple sclerosis ([place])   . Optic neuritis   . Shortness of breath   . Swallowing difficulty   . Swelling of both lower extremities   . Vision, loss, sudden   . Vitamin D deficiency     PAST SURGICAL HISTORY: Past Surgical History:  Procedure Laterality Date  . BREAST REDUCTION SURGERY    . CYST EXCISION    . ovarian cyst removed    . TUBAL LIGATION    . WISDOM TOOTH EXTRACTION      FAMILY HISTORY: Family History  Problem Relation Age of Onset  . Cancer Mother   . Diabetes Mother   . Hearing loss Mother   . Thyroid nodules Mother   . Anemia Mother   . Kidney disease Mother   . Hypertension Mother   . Obesity Mother   . Hyperlipidemia Father   . High blood pressure Father   . Sleep apnea Father   . Anemia Sister   . Arthritis Maternal Grandmother   . Arthritis Maternal Grandfather   . Arthritis Paternal Grandmother   . Kidney disease Paternal Grandmother   . Hypertension Paternal Grandmother   . Arthritis Paternal Grandfather   . Vision loss Paternal Grandfather   . Stroke Paternal Grandfather   . Hyperlipidemia Paternal Grandfather   . Anemia Sister     SOCIAL HISTORY: Social History   Socioeconomic History  . Marital status: Single    Spouse name: Not on file  . Number of children: 0  . Years of education: College  . Highest education level: Not on file  Occupational History  . 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 Smoking status: Never Smoker  . Smokeless tobacco: Never Used  Substance and Sexual Activity  . Alcohol use: No  . Drug use: No  . Sexual activity: Not Currently    Birth control/protection: Pill  Other Topics Concern  . Not on file  Social History Narrative   Live at home with parents.   Right handed.   Uses caffeine sparingly.   Social Determinants of Health   Financial Resource Strain:   . Difficulty of Paying Living Expenses: Not on file  Food Insecurity:   . Worried About Charity fundraiser in the Last Year: Not on file  . Ran Out of Food in the Last Year: Not on file  Transportation Needs:   . Lack of Transportation (Medical): Not on file  . Lack of Transportation (Non-Medical): Not on file  Physical Activity:   . Days of Exercise per Week: Not on file  . Minutes of Exercise per Session: Not on file  Stress:   . Feeling of Stress : Not on file  Social Connections:   . Frequency of Communication with Friends and Family: Not on file  . Frequency of Social Gatherings with Friends and Family: Not on file  . Attends Religious Services: Not on file  . Active Member of Clubs or Organizations: Not on file  . Attends Archivist Meetings: Not on file  . Marital Status: Not on file  Intimate Partner Violence:   . Fear of Current or Ex-Partner: Not on file  . Emotionally Abused: Not on file  . Physically Abused: Not on file  . Sexually Abused: Not on file     PHYSICAL EXAM   Vitals:   01/19/20 0729  BP: (!) 144/73  Pulse: 79  Temp: (!) 97.3 F (36.3 C)  Weight: 283 lb 8 oz (128.6 kg)  Height: 5' 6.5" (1.689 m)    Not recorded      Body mass index is 45.07 kg/m.  PHYSICAL EXAMNIATION:  Gen: NAD, conversant, well nourised, well groomed                     Cardiovascular: Regular rate rhythm, no peripheral edema, warm, nontender. Eyes: Conjunctivae clear without exudates or hemorrhage Neck: Supple, no carotid  bruits. Pulmonary: Clear to auscultation bilaterally   NEUROLOGICAL EXAM:  MENTAL STATUS: Speech:    Speech is normal; fluent and spontaneous with normal comprehension.  Tearful during today's visit Cognition:     Orientation to time, place and person     Normal recent and remote memory     Normal Attention span and concentration     Normal Language, naming, repeating,spontaneous speech     Fund of knowledge   CRANIAL NERVES: CN II: Visual fields are full to confrontation. Pupils are round equal and briskly reactive to light. CN III, IV, VI: extraocular movement are normal. No ptosis. CN V: Facial sensation is intact to light touch CN VII: Face is symmetric with normal eye closure  CN VIII: Hearing is normal to causal conversation. CN IX, X: Phonation is normal. CN XI: Head turning and shoulder shrug are intact  MOTOR: There is no pronator drift of out-stretched arms. Muscle bulk and tone are normal. Muscle strength is normal.  REFLEXES: Reflexes are 1 and symmetric at the biceps, triceps, knees, and ankles. Plantar responses are flexor.  SENSORY: Intact to light touch  COORDINATION: There is no trunk or limb dysmetria noted.  GAIT/STANCE: Need push-up to get up from seated position, mild difficulty with tandem walking  DIAGNOSTIC DATA (LABS, IMAGING, TESTING) - I reviewed patient records, labs, notes, testing and imaging myself where available.   ASSESSMENT AND PLAN  Kassi Siegmann is a 49 y.o. female   Relapsing remitting multiple sclerosis  Has been on ocrelizumab since February 2020,  Overall stable  Repeat MRI of the brain with without contrast,  Laboratory evaluation in January 2021: Normal creatinine 0.69 elevated glucose  Vitamin D deficiency:  Level was 25, she should continue on vitamin D 3 supplements, 1000 units daily  Diabetes out-of-control:  A1c was 10.8, emphasized importance of exercise, work with her primary care physician for better control  of diabetes  Worsening depression,  Add on Effexor XR titrating to 75 mg daily  Worsening migraine headaches:  Continue Topamax 100 mg 2 tablets every night  as preventive medication, Maxalt as needed  Worsening hearing loss  Refer her to ENT    Marcial Pacas, M.D. Ph.D.  [facility] [address] [place], [place] [address] Ph: [phone] Fax: [phone]  CC: Referring Provider

## 2020-01-20 ENCOUNTER — Telehealth: Payer: Self-pay | Admitting: Neurology

## 2020-01-20 NOTE — Telephone Encounter (Signed)
Medicare order sent to GI. No auth they will reach out to the patient to schedule.  

## 2020-01-22 DIAGNOSIS — G35 Multiple sclerosis: Secondary | ICD-10-CM | POA: Diagnosis not present

## 2020-02-05 ENCOUNTER — Ambulatory Visit: Payer: Medicare Other

## 2020-02-09 ENCOUNTER — Ambulatory Visit: Payer: Medicare Other | Attending: Family

## 2020-02-09 DIAGNOSIS — Z23 Encounter for immunization: Secondary | ICD-10-CM | POA: Insufficient documentation

## 2020-02-09 NOTE — Progress Notes (Signed)
   Covid-19 Vaccination Clinic  Name:  Acelyn Basham    MRN: [id number] DOB: 1971-04-06  02/09/2020  Ms. Eklund was observed post Covid-19 immunization for 15 minutes without incidence. She was provided with Vaccine Information Sheet and instruction to access the V-Safe system.   Ms. Anspaugh was instructed to call 911 with any severe reactions post vaccine: Marland Kitchen Difficulty breathing  . Swelling of your face and throat  . A fast heartbeat  . A bad rash all over your body  . Dizziness and weakness    Immunizations Administered    Name Date Dose VIS Date Route   Moderna COVID-19 Vaccine 02/09/2020  9:29 AM 0.5 mL 11/18/2019 Intramuscular   Manufacturer: Moderna   Lot: [id number]   NDC: 48185-909-31

## 2020-02-12 ENCOUNTER — Other Ambulatory Visit: Payer: Self-pay | Admitting: Family Medicine

## 2020-02-13 ENCOUNTER — Ambulatory Visit
Admission: RE | Admit: 2020-02-13 | Discharge: 2020-02-13 | Disposition: A | Discharge status: Home / Self Care | Payer: Medicare Other | Source: Ambulatory Visit | Attending: Neurology | Admitting: Neurology

## 2020-02-13 ENCOUNTER — Other Ambulatory Visit: Payer: Self-pay

## 2020-02-13 DIAGNOSIS — G4719 Other hypersomnia: Secondary | ICD-10-CM

## 2020-02-13 DIAGNOSIS — G35 Multiple sclerosis: Secondary | ICD-10-CM

## 2020-02-13 DIAGNOSIS — F32A Depression, unspecified: Secondary | ICD-10-CM

## 2020-02-13 DIAGNOSIS — R5383 Other fatigue: Secondary | ICD-10-CM

## 2020-02-13 DIAGNOSIS — G43009 Migraine without aura, not intractable, without status migrainosus: Secondary | ICD-10-CM

## 2020-02-13 DIAGNOSIS — F329 Major depressive disorder, single episode, unspecified: Secondary | ICD-10-CM

## 2020-02-13 DIAGNOSIS — R269 Unspecified abnormalities of gait and mobility: Secondary | ICD-10-CM

## 2020-02-13 MED ORDER — GADOBENATE DIMEGLUMINE 529 MG/ML IV SOLN
20.0000 mL | Freq: Once | INTRAVENOUS | Status: AC | PRN
  Administered 2020-02-13: 20 mL via INTRAVENOUS

## 2020-02-17 DIAGNOSIS — H838X3 Other specified diseases of inner ear, bilateral: Secondary | ICD-10-CM | POA: Diagnosis not present

## 2020-02-17 DIAGNOSIS — H903 Sensorineural hearing loss, bilateral: Secondary | ICD-10-CM | POA: Diagnosis not present

## 2020-02-17 DIAGNOSIS — H9313 Tinnitus, bilateral: Secondary | ICD-10-CM | POA: Diagnosis not present

## 2020-02-18 ENCOUNTER — Other Ambulatory Visit: Payer: Self-pay | Admitting: Family Medicine

## 2020-02-18 DIAGNOSIS — Z794 Long term (current) use of insulin: Secondary | ICD-10-CM

## 2020-02-18 DIAGNOSIS — E119 Type 2 diabetes mellitus without complications: Secondary | ICD-10-CM

## 2020-02-24 ENCOUNTER — Encounter: Payer: Self-pay | Admitting: Family Medicine

## 2020-02-24 MED ORDER — NORGESTIMATE-ETH ESTRADIOL 0.25-35 MG-MCG PO TABS: ORAL_TABLET | ORAL | 1 refills | Status: DC

## 2020-02-25 DIAGNOSIS — H401131 Primary open-angle glaucoma, bilateral, mild stage: Secondary | ICD-10-CM | POA: Diagnosis not present

## 2020-03-09 ENCOUNTER — Ambulatory Visit: Payer: Medicare Other | Attending: Family

## 2020-03-09 DIAGNOSIS — Z23 Encounter for immunization: Secondary | ICD-10-CM

## 2020-03-09 NOTE — Progress Notes (Signed)
   Covid-19 Vaccination Clinic  Name:  Katrina Montes    MRN: [id number] DOB: June 18, 1971  03/09/2020  [name] was observed post Covid-19 immunization for 15 minutes without incident. She was provided with Vaccine Information Sheet and instruction to access the V-Safe system.   [name] was instructed to call 911 with any severe reactions post vaccine: Marland Kitchen Difficulty breathing  . Swelling of face and throat  . A fast heartbeat  . A bad rash all over body  . Dizziness and weakness   Immunizations Administered    Name Date Dose VIS Date Route   Moderna COVID-19 Vaccine 03/09/2020  9:06 AM 0.5 mL 11/18/2019 Intramuscular   Manufacturer: Moderna   Lot: [id number]   NDC: 52174-715-95

## 2020-05-05 ENCOUNTER — Ambulatory Visit: Payer: Medicare Other | Admitting: Family Medicine

## 2020-05-13 ENCOUNTER — Other Ambulatory Visit: Payer: Self-pay | Admitting: Family Medicine

## 2020-07-19 ENCOUNTER — Encounter: Payer: Self-pay | Admitting: Neurology

## 2020-07-19 ENCOUNTER — Ambulatory Visit (INDEPENDENT_AMBULATORY_CARE_PROVIDER_SITE_OTHER): Payer: Medicare Other | Admitting: Neurology

## 2020-07-19 VITALS — BP 164/95 | HR 85 | Ht 66.5 in | Wt 287.6 lb

## 2020-07-19 DIAGNOSIS — G43009 Migraine without aura, not intractable, without status migrainosus: Secondary | ICD-10-CM | POA: Diagnosis not present

## 2020-07-19 DIAGNOSIS — E559 Vitamin D deficiency, unspecified: Secondary | ICD-10-CM | POA: Diagnosis not present

## 2020-07-19 DIAGNOSIS — M5416 Radiculopathy, lumbar region: Secondary | ICD-10-CM | POA: Diagnosis not present

## 2020-07-19 DIAGNOSIS — G35 Multiple sclerosis: Secondary | ICD-10-CM | POA: Diagnosis not present

## 2020-07-19 MED ORDER — MECLIZINE HCL 25 MG PO TABS: 25.0000 mg | ORAL_TABLET | Freq: Three times a day (TID) | ORAL | 6 refills | Status: AC | PRN

## 2020-07-19 NOTE — Progress Notes (Signed)
PATIENT: Katrina Montes DOB: 06-05-1971  REASON FOR VISIT: follow up HISTORY FROM: patient  HISTORY OF PRESENT ILLNESS: Today 07/19/20  HISTORY  Katrina Montes 49 right-handed African-American female, accompanied by her mother, referred by her primary care physician Dr. Buelah Manis for evaluation of right optic neuritis, abnormal MRI of the brain, suspicious for relapsing remitting multiple sclerosis, initial evaluation was on Jan 14 2015.  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  Relapsing Remitting Multiple Sclerosis was diagnosed since January 2016: Based on her abnormal MRI of the brain, there was also cervical spinal cord involvement  MRI of the brain without contrast at [facility] imaging January 2016 showed multiple periventricular white matter disease, significant involvement of corpus callosum, perpendicular to ventricle, consistent with multiple sclerosis, now was also suspicious for right optic nerve edema, with hyperintensity signal at T2, FLAIR, no contrast enhancement, MRA of brain was normal.  MRI of the cervical spine with and without contrast in Jan 2016: showing hyperintense foci within the spinal cord posteriorly at C4 and posteriolaterally at C7-T1 consistent with multiple sclerosis plaques. There were no enhancing foci.  Spinal fluid testing January 13 2015, total protein was 708, RBC was 1600, RBC was 7,, cloudy reddish-looking,elevated IgG 17.7, IgG index 0.97, IgG synthetic rate 238, 0  oligoclonal bands,  Visual evoked potential showed prolongation on the right side.  She was treated with IVSolu-Medrol for 3 daysin Jan2016, which onlymild improved her right vision, has significant worsening of her blood glucose level.She was treated with Achtar injection in Feb 2016,but still having significant residual visual difficulty.  JC virus was positive with titer of 1.1 3 (Jan 14 2015), laboratory evaluation showed normal or negative CPK, HIV, protein electrophoresis, positive varicella-zoster virus antibody, mild elevated ESR, C-reactive protein, hepatitis panel, Lyme titer,showed anemia, hemoglobin 11.8, otherwise normal CBC, normal CMP, with exception of elevated glucose, normal TSH,  She was treated withTysarbrilsinceMarch 14th, 2016,was switched to ocrelizumab since February 2020  MRI of the brain with and without contrast in April 2019 showed multiple supratentorium lesions, no contrast enhancement, no significant change compared to previous MRI of the brain  Vision:History of right optic neuritis, with partial recovery, now complains of blurry vision, difficult to read anything without bleeders or magnified glasses, difficult to read through computer screen or watch TV, visual acuity, OS 20/40, OD 20/50-1  Cognitive impairment:has to take frequent notes, difficulty to concentrate, to express herself.  Gait abnormality: Treated easily, generalized weakness, also complicated by her low back pain,leftsided radicular pain,  Spasticity:Bilateral lower extremity spasticity, is taking gabapentin 300 mg 3 times a day previously tried and failed baclofen  Fatigue: Severe fatigue, also complicated by her depression anxiety chronic insomnia, body achy pain, limit her daily activity more than 50%,she has heavy menstruation-like cycle, chronic anemia, recent hemoglobin was 10.6.  Chronic migraine: She also has history of chronic migraine headaches, retro-orbital  area severe pounding headache with associated light noise sensitivity, and happened frequently,up to 3 to four times each week, Maxalt  as needed was helpful,  Severe low back pain, radiating pain to left lower extremity, MRI of lumbar in July 201 there is minimum anterolisthesis of L4 upon L5, due to severe facet hypertrophy, associated with left ligamentum flavum hypertrophy and disc protrusion, there is moderate Left foraminal and lateral recess stenosis, potential for left L4-5 nerve root compression.  Diabetic peripheral neuropathy, Progressive worsening bilateral feet paresthesia, nowtaking insulin  Depression anxiety, chronic insomnia, polypharmacy treatment: Trazodone 50 mg every night, gabapentin 300 mg 3 times a day, Topamax 100 mg twice a day, Flexeril as needed  Weaknessin hands: EMG nerve conduction study from outside clinic confirmed the diagnosis of severe right median neuropathy,  Urinary frequency: stable Hearing Loss-she reports occasional ringing in her ears, notices her hearing is not as good as it used to be, having to read lips,this is embarrassing to her  Mild obstructive sleep apnea, using CPAP machine,  Vitamin D deficiency: Vitamin D level was 23, on supplement  Update July 19, 2020 SS: Remains on [place], when last seen was referred to ENT, Effexor was added for depression.  MRI of the brain with and without contrast in February 2021 showed no significant change compared to previous in April 2019.  Next Ocrevus in 2 weeks. Lives with parents, drives a car.   Depression-improved with Effexor XR 75 mg daily Migraines-improved, 3 severe monthly, 1 mild weekly, on Topamax, will take Advil, has not been using Maxalt Low back pain, radiates left lower extremity- chronic, worsening last several months, using cane, no falls, spells of feeling like losing balance, dizziness spell, wants refill of meclizine, left low back pain, radiates to left leg, isn't taking  gabapentin, has Norco PRN  (MRI of lumbar in July 2017 there is minimum anterolisthesis of L4 upon L5, due to severe facet hypertrophy, associated with left ligamentum flavum hypertrophy and disc protrusion, there is moderate Left foraminal and lateral recess stenosis, potential for left L4-5 nerve root compression) Diabetes-due for A1c recheck, doesn't feel out of control, trying to watch diet Vitamin D deficiency-on supplement Neuropathy- worse at night, numbness in feet Hearing loss-refer to ENT, told slight hearing loss, hearing aid is option Urinary urgency/frequency: Stable OSA-not using CPAP, need to order something  REVIEW OF SYSTEMS: Out of a complete 14 system review of symptoms, the patient complains only of the following symptoms, and all other reviewed systems are negative.  Back pain gait abnormality, numbness, depression, headache  ALLERGIES: No Known Allergies  HOME MEDICATIONS: Outpatient Medications Prior to Visit  Medication Sig Dispense Refill  . APPLE CIDER VINEGAR PO Take 1 tablet by mouth 2 (two) times daily.    . Blood Glucose Monitoring Suppl (BLOOD GLUCOSE SYSTEM PAK) KIT Please dispense as Guide Me System. Use as directed to monitor FSBS 3x daily. Dx: E11.9. 100 each 3  . cyclobenzaprine (FLEXERIL) 10 MG tablet TAKE 1 Tablet BY MOUTH 4 TIMES DAILY AS NEEDED  3  . DiphenhydrAMINE HCl (BENADRYL ALLERGY PO) Take by mouth as needed.    . docusate sodium (COLACE) 50 MG capsule Take 1 capsule (50 mg total) by mouth 2 (two) times daily. 60 capsule 11  . ELDERBERRY PO Take 1,150 mg by mouth daily.    Marland Kitchen gabapentin (NEURONTIN) 300 MG capsule Take 328m BID and 6062mat bedtime 120 capsule 11  . Glucose Blood (BLOOD GLUCOSE TEST STRIPS) STRP Please dispense as Guide Me System. Use as directed to monitor FSBS 3x daily. Dx: E11.9. 100 each 3  . HYDROcodone-acetaminophen ([place]  7.5-325 MG tablet Take 1 tablet by mouth every 6 (six) hours as needed for moderate pain. 45  tablet 0  . ibuprofen (ADVIL,MOTRIN) 200 MG tablet Take 200 mg by mouth every 6 (six) hours as needed.    . Insulin Glargine (BASAGLAR KWIKPEN) 100 UNIT/ML SOPN INJECT 25-35 UNITS UNDER THE SKIN EVERY NIGHT 5 pen 3  . JANUMET 50-500 MG tablet TAKE 1 TABLET BY MOUTH TWICE DAILY 180 tablet 0  . Lancets MISC Please dispense as Guide Me System. Use as directed to monitor FSBS 3x daily. Dx: E11.9. 100 each 3  . lisinopril-hydrochlorothiazide (ZESTORETIC) 20-12.5 MG tablet TAKE 1 TABLET BY MOUTH DAILY 90 tablet 1  . Multiple Vitamin (MULTIVITAMIN) capsule Take 1 capsule by mouth daily.    . norgestimate-ethinyl estradiol (ESTARYLLA) 0.25-35 MG-MCG tablet TAKE 1 TABLET BY MOUTH ONCE A DAY( ALTERNATE FOR ESTARYLLA TABLETS) 90 tablet 1  . ocrelizumab (OCREVUS) 300 MG/10ML injection Inject into the vein every 6 (six) months.    . rizatriptan (MAXALT) 10 MG tablet Take 1 tablet (10 mg total) by mouth as needed for migraine. May repeat in 2 hours if needed. Max dose: 2/24 hr or 15/30 days. 45 tablet 3  . topiramate (TOPAMAX) 100 MG tablet TAKE 1 TABLET(100 MG) BY MOUTH TWICE DAILY 180 tablet 3  . venlafaxine XR (EFFEXOR XR) 75 MG 24 hr capsule Take 1 capsule (75 mg total) by mouth daily with breakfast. 30 capsule 11  . VITAMIN D PO Take 5,000 Units by mouth daily.    . meclizine (ANTIVERT) 25 MG tablet Take 1 tablet (25 mg total) by mouth 3 (three) times daily as needed for dizziness. 30 tablet 6  . venlafaxine XR (EFFEXOR XR) 37.5 MG 24 hr capsule Take 1 capsule (37.5 mg total) by mouth daily with breakfast. 14 capsule 0   No facility-administered medications prior to visit.    PAST MEDICAL HISTORY: Past Medical History:  Diagnosis Date  . Anemia   . Anxiety   . Back pain   . Carpal tunnel syndrome   . Constipation   . Depression   . Diabetes mellitus   . Hypertension   . Joint pain   . Lactose intolerance   . Multiple sclerosis ([place])   . Optic neuritis   . Shortness of breath   . Swallowing  difficulty   . Swelling of both lower extremities   . Vision, loss, sudden   . Vitamin D deficiency     PAST SURGICAL HISTORY: Past Surgical History:  Procedure Laterality Date  . BREAST REDUCTION SURGERY    . CYST EXCISION    . ovarian cyst removed    . TUBAL LIGATION    . WISDOM TOOTH EXTRACTION      FAMILY HISTORY: Family History  Problem Relation Age of Onset  . Cancer Mother   . Diabetes Mother   . Hearing loss Mother   . Thyroid nodules Mother   . Anemia Mother   . Kidney disease Mother   . Hypertension Mother   . Obesity Mother   . Hyperlipidemia Father   . High blood pressure Father   . Sleep apnea Father   . Anemia Sister   . Arthritis Maternal Grandmother   . Arthritis Maternal Grandfather   . Arthritis Paternal Grandmother   . Kidney disease Paternal Grandmother   . Hypertension Paternal Grandmother   . Arthritis Paternal Grandfather   . Vision loss Paternal Grandfather   . Stroke Paternal Grandfather   . Hyperlipidemia  Paternal Grandfather   . Anemia Sister     SOCIAL HISTORY: Social History   Socioeconomic History  . Marital status: Single    Spouse name: Not on file  . Number of children: 0  . Years of education: College  . Highest education level: Not on file  Occupational History  . 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 Smoking status: Never Smoker  . Smokeless tobacco: Never Used  Substance and Sexual Activity  . Alcohol use: No  . Drug use: No  . Sexual activity: Not Currently    Birth control/protection: Pill  Other Topics Concern  . Not on file  Social History Narrative   Live at home with parents.   Right handed.   Uses caffeine sparingly.   Social Determinants of Health   Financial Resource Strain:   . Difficulty of Paying Living Expenses:   Food Insecurity:   . Worried About Charity fundraiser in the Last Year:   . Arboriculturist in the Last Year:   Transportation Needs:   . Film/video editor (Medical):    Marland Kitchen Lack of Transportation (Non-Medical):   Physical Activity:   . Days of Exercise per Week:   . Minutes of Exercise per Session:   Stress:   . Feeling of Stress :   Social Connections:   . Frequency of Communication with Friends and Family:   . Frequency of Social Gatherings with Friends and Family:   . Attends Religious Services:   . Active Member of Clubs or Organizations:   . Attends Archivist Meetings:   Marland Kitchen Marital Status:   Intimate Partner Violence:   . Fear of Current or Ex-Partner:   . Emotionally Abused:   Marland Kitchen Physically Abused:   . Sexually Abused:    PHYSICAL EXAM  Vitals:   07/19/20 0741  BP: (!) 164/95  Pulse: 85  Weight: (!) 287 lb 9.6 oz (130.5 kg)  Height: 5' 6.5" (1.689 m)   Body mass index is 45.72 kg/m.  Generalized: Well developed, in no acute distress  Neurological examination  Mentation: Alert oriented to time, place, history taking. Follows all commands speech and language fluent Cranial nerve II-XII: Pupils were equal round reactive to light. Extraocular movements were full, visual field were full on confrontational test. Facial sensation and strength were normal. Head turning and shoulder shrug were normal and symmetric. Equal moderate grip strengths bilaterally  Motor: 4/5 left hip flexion, left knee extension Sensory: Sensory testing is intact to soft touch on all 4 extremities. No evidence of extinction is noted.  Coordination: Cerebellar testing reveals good finger-nose-finger and heel-to-shin bilaterally.  Gait and station: Has to push off from seated position to stand, gait is slightly wide-based, antalgic on the left Reflexes: Deep tendon reflexes are symmetric 1+ bilaterally  DIAGNOSTIC DATA (LABS, IMAGING, TESTING) - I reviewed patient records, labs, notes, testing and imaging myself where available.  Lab Results  Component Value Date   WBC 7.6 01/05/2020   HGB 12.1 01/05/2020   HCT 38.1 01/05/2020   MCV 82.8 01/05/2020    PLT 344 01/05/2020      Component Value Date/Time   NA 137 01/05/2020 0833   NA 139 02/25/2019 0833   K 4.6 01/05/2020 0833   CL 105 01/05/2020 0833   CO2 23 01/05/2020 0833   GLUCOSE 193 (H) 01/05/2020 0833   BUN 10 01/05/2020 0833   BUN 9 02/25/2019 0833   CREATININE 0.69 01/05/2020 6295  CALCIUM 9.1 01/05/2020 0833   PROT 7.0 01/05/2020 0833   PROT 7.0 02/25/2019 0833   ALBUMIN 3.9 02/25/2019 0833   AST 17 01/05/2020 0833   ALT 11 01/05/2020 0833   ALKPHOS 70 02/25/2019 0833   BILITOT 0.4 01/05/2020 0833   BILITOT 0.3 02/25/2019 0833   GFRNONAA 104 02/25/2019 0833   GFRAA 120 02/25/2019 0833   Lab Results  Component Value Date   CHOL 198 01/05/2020   HDL 65 01/05/2020   LDLCALC 105 (H) 01/05/2020   TRIG 164 (H) 01/05/2020   CHOLHDL 3.0 01/05/2020   Lab Results  Component Value Date   HGBA1C 10.8 (H) 01/05/2020   No results found for: VITAMINB12 Lab Results  Component Value Date   TSH 1.050 10/21/2018      ASSESSMENT AND PLAN 49 y.o. year old female  has a past medical history of Anemia, Anxiety, Back pain, Carpal tunnel syndrome, Constipation, Depression, Diabetes mellitus, Hypertension, Joint pain, Lactose intolerance, Multiple sclerosis (HCC), Optic neuritis, Shortness of breath, Swallowing difficulty, Swelling of both lower extremities, Vision, loss, sudden, and Vitamin D deficiency. here with:  1.  Relapsing remitting multiple sclerosis -Ocrevus since February 2020, overall stable, next infusion in 2 weeks -MRI of the brain with and without contrast in February 2021 showed no significant change compared to previous in April 2019 -Check CBC, CMP, IgA, IgG, IgM  2.  Vitamin D deficiency -Level was 25, continue vitamin D3 supplements, 1000 units daily  3.  Diabetes, poorly controlled -See PCP for recheck, in January, poorly controlled 10.8, discussed importance of good management of this  4.  Depression -Improved, continue Effexor XR 75 mg daily  5.   Migraine headaches -Improved, 3 severe a month, 1 mild-moderate weekly -Continue Topamax 100 mg, 2 tablets at bedtime -Continue Maxalt as needed (take this if needed for severe headache)  6.  Dizzy spells with walking -Refill meclizine PRN   7.  Low back pain, radiating to left lower extremity, chronic -Ocrevus due in 2 weeks, historically, pain has improved after infusion -If no improvement, may consider repeat MRI lumbar spine, can also restart gabapentin, could consider PT/gait training -MRI of lumbar in July 2017 there is minimum anterolisthesis of L4 upon L5, due to severe facet hypertrophy, associated with left ligamentum flavum hypertrophy and disc protrusion, there is moderate Left foraminal and lateral recess stenosis, potential for left L4-5 nerve root compression. -Encouraged to reach out for progress report, otherwise will see in 6 months or sooner if needed  I spent 30 minutes of face-to-face and non-face-to-face time with patient.  This included previsit chart review, lab review, study review, order entry, electronic health record documentation, patient education.   Butler Denmark, AGNP-C, DNP 07/19/2020, 8:14 AM [facility] [address], [place], [place] [address] [phone]

## 2020-07-19 NOTE — Patient Instructions (Signed)
Check blood work today  Continue current medications See primary doctor for recheck A1C If back pain doesn't improve after Ocrevus, let me know, may consider MRI Lumbar Spine  See you back in 6 months

## 2020-07-20 LAB — COMPREHENSIVE METABOLIC PANEL
ALT: 17 IU/L (ref 0–32)
AST: 29 IU/L (ref 0–40)
Albumin/Globulin Ratio: 1.1 — ABNORMAL LOW (ref 1.2–2.2)
Albumin: 3.8 g/dL (ref 3.8–4.8)
Alkaline Phosphatase: 70 IU/L (ref 48–121)
BUN/Creatinine Ratio: 17 (ref 9–23)
BUN: 12 mg/dL (ref 6–24)
Bilirubin Total: 0.4 mg/dL (ref 0.0–1.2)
CO2: 23 mmol/L (ref 20–29)
Calcium: 9.1 mg/dL (ref 8.7–10.2)
Chloride: 100 mmol/L (ref 96–106)
Creatinine, Ser: 0.69 mg/dL (ref 0.57–1.00)
GFR calc Af Amer: 120 mL/min/{1.73_m2} (ref >59)
GFR calc non Af Amer: 104 mL/min/{1.73_m2} (ref >59)
Globulin, Total: 3.4 g/dL (ref 1.5–4.5)
Glucose: 226 mg/dL — ABNORMAL HIGH (ref 65–99)
Potassium: 4.6 mmol/L (ref 3.5–5.2)
Sodium: 137 mmol/L (ref 134–144)
Total Protein: 7.2 g/dL (ref 6.0–8.5)

## 2020-07-20 LAB — CBC WITH DIFFERENTIAL/PLATELET
Basophils Absolute: 0 10*3/uL (ref 0.0–0.2)
Basos: 1 %
EOS (ABSOLUTE): 0.1 10*3/uL (ref 0.0–0.4)
Eos: 1 %
Hematocrit: 36.2 % (ref 34.0–46.6)
Hemoglobin: 11.6 g/dL (ref 11.1–15.9)
Immature Grans (Abs): 0 10*3/uL (ref 0.0–0.1)
Immature Granulocytes: 0 %
Lymphocytes Absolute: 1.4 10*3/uL (ref 0.7–3.1)
Lymphs: 20 %
MCH: 26.4 pg — ABNORMAL LOW (ref 26.6–33.0)
MCHC: 32 g/dL (ref 31.5–35.7)
MCV: 83 fL (ref 79–97)
Monocytes Absolute: 0.6 10*3/uL (ref 0.1–0.9)
Monocytes: 9 %
Neutrophils Absolute: 4.8 10*3/uL (ref 1.4–7.0)
Neutrophils: 69 %
Platelets: 324 10*3/uL (ref 150–450)
RBC: 4.39 x10E6/uL (ref 3.77–5.28)
RDW: 13.4 % (ref 11.7–15.4)
WBC: 6.9 10*3/uL (ref 3.4–10.8)

## 2020-07-20 LAB — IGG, IGA, IGM
IgA/Immunoglobulin A, Serum: 233 mg/dL (ref 87–352)
IgG (Immunoglobin G), Serum: 1514 mg/dL (ref 586–1602)
IgM (Immunoglobulin M), Srm: 25 mg/dL — ABNORMAL LOW (ref 26–217)

## 2020-07-22 ENCOUNTER — Telehealth: Payer: Self-pay | Admitting: *Deleted

## 2020-07-22 NOTE — Telephone Encounter (Addendum)
Spoke with pt and advised her of her lab results per Maralyn Sago NP. Pt verbalized understanding and appreciation. She had no questions.   Glean Salvo, NP  07/20/2020 7:49 AM EDT Back to Top    Labs show no significant abnormality, random glucose is elevated 226. Needs to have good control of DM. Will continue Ocrevus every 6 months.

## 2020-08-05 DIAGNOSIS — G35 Multiple sclerosis: Secondary | ICD-10-CM | POA: Diagnosis not present

## 2020-08-12 ENCOUNTER — Other Ambulatory Visit: Payer: Self-pay | Admitting: Family Medicine

## 2020-08-12 ENCOUNTER — Other Ambulatory Visit: Payer: Self-pay | Admitting: *Deleted

## 2020-08-12 MED ORDER — NORGESTIMATE-ETH ESTRADIOL 0.25-35 MG-MCG PO TABS: 1.0000 | ORAL_TABLET | Freq: Every day | ORAL | 3 refills | Status: DC

## 2020-09-02 DIAGNOSIS — E113393 Type 2 diabetes mellitus with moderate nonproliferative diabetic retinopathy without macular edema, bilateral: Secondary | ICD-10-CM | POA: Diagnosis not present

## 2020-09-02 LAB — HM DIABETES EYE EXAM

## 2020-09-29 ENCOUNTER — Other Ambulatory Visit: Payer: Self-pay | Admitting: Family Medicine

## 2020-09-29 DIAGNOSIS — Z1231 Encounter for screening mammogram for malignant neoplasm of breast: Secondary | ICD-10-CM

## 2020-10-31 ENCOUNTER — Other Ambulatory Visit: Payer: Self-pay | Admitting: Family Medicine

## 2020-10-31 DIAGNOSIS — E119 Type 2 diabetes mellitus without complications: Secondary | ICD-10-CM

## 2020-11-01 MED ORDER — JANUMET 50-500 MG PO TABS: 1.0000 | ORAL_TABLET | Freq: Two times a day (BID) | ORAL | 0 refills | Status: DC

## 2020-11-04 ENCOUNTER — Ambulatory Visit
Admission: RE | Admit: 2020-11-04 | Discharge: 2020-11-04 | Disposition: A | Discharge status: Home / Self Care | Payer: Medicare Other | Source: Ambulatory Visit | Attending: Family Medicine | Admitting: Family Medicine

## 2020-11-04 ENCOUNTER — Other Ambulatory Visit: Payer: Self-pay

## 2020-11-04 DIAGNOSIS — Z1231 Encounter for screening mammogram for malignant neoplasm of breast: Secondary | ICD-10-CM | POA: Diagnosis not present

## 2020-11-16 ENCOUNTER — Ambulatory Visit: Payer: Medicare Other | Attending: Internal Medicine

## 2020-11-16 DIAGNOSIS — Z23 Encounter for immunization: Secondary | ICD-10-CM

## 2020-11-16 NOTE — Progress Notes (Signed)
   Covid-19 Vaccination Clinic  Name:  Katrina Montes    MRN: [id number] DOB: 10-26-1971  11/16/2020  [name] was observed post Covid-19 immunization for 15 minutes without incident. She was provided with Vaccine Information Sheet and instruction to access the V-Safe system.   [name] was instructed to call 911 with any severe reactions post vaccine: Marland Kitchen Difficulty breathing  . Swelling of face and throat  . A fast heartbeat  . A bad rash all over body  . Dizziness and weakness   Immunizations Administered    No immunizations on file.

## 2020-11-19 ENCOUNTER — Other Ambulatory Visit: Payer: Self-pay

## 2020-11-19 ENCOUNTER — Ambulatory Visit (INDEPENDENT_AMBULATORY_CARE_PROVIDER_SITE_OTHER): Payer: Medicare Other | Admitting: Family Medicine

## 2020-11-19 ENCOUNTER — Encounter: Payer: Self-pay | Admitting: Family Medicine

## 2020-11-19 VITALS — BP 128/78 | HR 82 | Temp 98.3°F | Resp 14 | Ht 66.5 in | Wt 289.0 lb

## 2020-11-19 DIAGNOSIS — I10 Essential (primary) hypertension: Secondary | ICD-10-CM | POA: Diagnosis not present

## 2020-11-19 DIAGNOSIS — E1165 Type 2 diabetes mellitus with hyperglycemia: Secondary | ICD-10-CM

## 2020-11-19 DIAGNOSIS — G4733 Obstructive sleep apnea (adult) (pediatric): Secondary | ICD-10-CM | POA: Diagnosis not present

## 2020-11-19 DIAGNOSIS — D509 Iron deficiency anemia, unspecified: Secondary | ICD-10-CM | POA: Diagnosis not present

## 2020-11-19 DIAGNOSIS — G35 Multiple sclerosis: Secondary | ICD-10-CM | POA: Diagnosis not present

## 2020-11-19 DIAGNOSIS — E669 Obesity, unspecified: Secondary | ICD-10-CM | POA: Diagnosis not present

## 2020-11-19 DIAGNOSIS — E559 Vitamin D deficiency, unspecified: Secondary | ICD-10-CM | POA: Diagnosis not present

## 2020-11-19 NOTE — Patient Instructions (Addendum)
Schedule Flu shot  Pharmacist appointment  F/U 3 months

## 2020-11-19 NOTE — Progress Notes (Signed)
Subjective:    Patient ID: Katrina Montes, female    DOB: Jan 08, 1971, 49 y.o.   MRN: [id number]  Patient presents for Follow-up (is fasting)   Pt here to f/u chronic medical problems.  MS- overall has bene stable , still on Ocrevus every 6 months, using cane to ambulate  She is still on topamax  Diabetes mellitus last A1c in Jan 10.8% in Jan,  Katrina Montes was increased to 30  units at bedtime, also on Janumet - she states it cost $ 100  States that CBG have been high Trying to stay active, stress eating and carbs,  She eats irregulary, and then snacks, sometimes eats lates   Hyperlipidemia her lipids had improved at the last visit.  LDL was 98 triglycerides 166  Hypertension-taking lisinopril HCTZ as prescribed without any difficulty  OSA but not using CPAP machine, cost was an issue - she wants to wait a few months to address    Neurology treats migraines with topamax   Seen by ENT- has mild hearing loss and tinnitus  Eye exam UTD March 2021   COVID booster done last week   Review Of Systems:  GEN- denies fatigue, fever, weight loss,weakness, recent illness HEENT- denies eye drainage, change in vision, nasal discharge, CVS- denies chest pain, palpitations RESP- denies SOB, cough, wheeze ABD- denies N/V, change in stools, abd pain GU- denies dysuria, hematuria, dribbling, incontinence MSK- denies joint pain, +aches, injury Neuro- denies headache, dizziness, syncope, seizure activity       Objective:    BP 128/78   Pulse 82   Temp 98.3 F (36.8 C) (Temporal)   Resp 14   Ht 5' 6.5" (1.689 m)   Wt 289 lb (131.1 kg)   LMP 11/02/2020   SpO2 98%   BMI 45.95 kg/m  GEN- NAD, alert and oriented x3 HEENT- PERRL, EOMI, non injected sclera, pink conjunctiva, MMM, oropharynx clear Neck- Supple, no thyromegaly CVS- RRR, no murmur RESP-CTAB ABD-NABS,soft,NT,ND EXT- No edema Pulses- Radial, DP- 2+        Assessment & Plan:      Problem List Items  Addressed This Visit      Unprioritized   ANEMIA-IRON DEFICIENCY   Relevant Orders   Iron, TIBC and Ferritin Panel (Completed)   Class 3 obesity   Diabetes mellitus type II, uncontrolled (HCC)    Uncontrolled, but also missed follow up for med titrations Check A1C goal is less than 7% Will get CCM Pharmacy involved to help with medications Continue Janumet and insulin       Relevant Orders   HM DIABETES FOOT EXAM (Completed)   CBC with Differential/Platelet (Completed)   Comprehensive metabolic panel (Completed)   Lipid panel (Completed)   Hemoglobin A1c (Completed)   Microalbumin / creatinine urine ratio (Completed)   Ambulatory referral to Chronic Care Management Services   Essential hypertension - Primary    Controlled no changes      Multiple sclerosis (HCC)    She is on meds per neurology We discussed ways to increase activity as tolerated Discussed dietary changes, reduction in weight will help overall co morbidites and her mobility/energy levels in conjunction with her treaments       Relevant Orders   Ambulatory referral to Chronic Care Management Services   OSA (obstructive sleep apnea)   Vitamin D deficiency   Relevant Orders   Vitamin D, 25-hydroxy (Completed)      Note: This dictation was prepared with Dragon dictation along with smaller  Company secretary. Any transcriptional errors that result from this process are unintentional.

## 2020-11-20 LAB — HEMOGLOBIN A1C
Hgb A1c MFr Bld: 12.3 % of total Hgb — ABNORMAL HIGH (ref <5.7)
Mean Plasma Glucose: 306 (calc)
eAG (mmol/L): 17 (calc)

## 2020-11-20 LAB — MICROALBUMIN / CREATININE URINE RATIO
Creatinine, Urine: 123 mg/dL (ref 20–275)
Microalb Creat Ratio: 9 mcg/mg creat (ref <30)
Microalb, Ur: 1.1 mg/dL

## 2020-11-20 LAB — CBC WITH DIFFERENTIAL/PLATELET
Absolute Monocytes: 1036 cells/uL — ABNORMAL HIGH (ref 200–950)
Basophils Absolute: 33 cells/uL (ref 0–200)
Basophils Relative: 0.5 %
Eosinophils Absolute: 73 cells/uL (ref 15–500)
Eosinophils Relative: 1.1 %
HCT: 37.1 % (ref 35.0–45.0)
Hemoglobin: 11.9 g/dL (ref 11.7–15.5)
Lymphs Abs: 950 cells/uL (ref 850–3900)
MCH: 26.7 pg — ABNORMAL LOW (ref 27.0–33.0)
MCHC: 32.1 g/dL (ref 32.0–36.0)
MCV: 83.4 fL (ref 80.0–100.0)
MPV: 10.3 fL (ref 7.5–12.5)
Monocytes Relative: 15.7 %
Neutro Abs: 4508 cells/uL (ref 1500–7800)
Neutrophils Relative %: 68.3 %
Platelets: 270 10*3/uL (ref 140–400)
RBC: 4.45 10*6/uL (ref 3.80–5.10)
RDW: 13.3 % (ref 11.0–15.0)
Total Lymphocyte: 14.4 %
WBC: 6.6 10*3/uL (ref 3.8–10.8)

## 2020-11-20 LAB — COMPREHENSIVE METABOLIC PANEL
AG Ratio: 1.2 (calc) (ref 1.0–2.5)
ALT: 16 U/L (ref 6–29)
AST: 25 U/L (ref 10–35)
Albumin: 3.8 g/dL (ref 3.6–5.1)
Alkaline phosphatase (APISO): 62 U/L (ref 31–125)
BUN: 8 mg/dL (ref 7–25)
CO2: 24 mmol/L (ref 20–32)
Calcium: 9.1 mg/dL (ref 8.6–10.2)
Chloride: 100 mmol/L (ref 98–110)
Creat: 0.67 mg/dL (ref 0.50–1.10)
Globulin: 3.1 g/dL (calc) (ref 1.9–3.7)
Glucose, Bld: 277 mg/dL — ABNORMAL HIGH (ref 65–99)
Potassium: 4.4 mmol/L (ref 3.5–5.3)
Sodium: 135 mmol/L (ref 135–146)
Total Bilirubin: 0.4 mg/dL (ref 0.2–1.2)
Total Protein: 6.9 g/dL (ref 6.1–8.1)

## 2020-11-20 LAB — LIPID PANEL
Cholesterol: 182 mg/dL (ref <200)
HDL: 74 mg/dL (ref >=50)
LDL Cholesterol (Calc): 87 mg/dL (calc)
Non-HDL Cholesterol (Calc): 108 mg/dL (calc) (ref <130)
Total CHOL/HDL Ratio: 2.5 (calc) (ref <5.0)
Triglycerides: 116 mg/dL (ref <150)

## 2020-11-20 LAB — IRON,TIBC AND FERRITIN PANEL
%SAT: 10 % (calc) — ABNORMAL LOW (ref 16–45)
Ferritin: 28 ng/mL (ref 16–232)
Iron: 41 ug/dL (ref 40–190)
TIBC: 408 mcg/dL (calc) (ref 250–450)

## 2020-11-20 LAB — VITAMIN D 25 HYDROXY (VIT D DEFICIENCY, FRACTURES): Vit D, 25-Hydroxy: 26 ng/mL — ABNORMAL LOW (ref 30–100)

## 2020-11-21 ENCOUNTER — Encounter: Payer: Self-pay | Admitting: Family Medicine

## 2020-11-21 NOTE — Assessment & Plan Note (Signed)
Controlled no changes 

## 2020-11-21 NOTE — Assessment & Plan Note (Signed)
Uncontrolled, but also missed follow up for med titrations Check A1C goal is less than 7% Will get CCM Pharmacy involved to help with medications Continue Janumet and insulin

## 2020-11-21 NOTE — Assessment & Plan Note (Addendum)
She is on meds per neurology We discussed ways to increase activity as tolerated Discussed dietary changes, reduction in weight will help overall co morbidites and her mobility/energy levels in conjunction with her treaments

## 2020-11-26 ENCOUNTER — Other Ambulatory Visit: Payer: Self-pay

## 2020-11-26 DIAGNOSIS — D509 Iron deficiency anemia, unspecified: Secondary | ICD-10-CM

## 2020-11-26 DIAGNOSIS — E559 Vitamin D deficiency, unspecified: Secondary | ICD-10-CM

## 2020-11-26 MED ORDER — VITAMIN D (ERGOCALCIFEROL) 1.25 MG (50000 UNIT) PO CAPS: 50000.0000 [IU] | ORAL_CAPSULE | ORAL | 0 refills | Status: DC

## 2020-11-26 MED ORDER — EQL SLOW RELEASE IRON 160 (50 FE) MG PO TBCR: 160.0000 mg | EXTENDED_RELEASE_TABLET | Freq: Every day | ORAL | 3 refills | Status: DC

## 2020-11-26 NOTE — Progress Notes (Signed)
Called pt , lm for call bk. Sent iron 160 mg po qd and vit d 50,000U po q7day to pharmacy listed in pt chart

## 2020-12-02 ENCOUNTER — Other Ambulatory Visit: Payer: Self-pay

## 2020-12-02 ENCOUNTER — Ambulatory Visit (INDEPENDENT_AMBULATORY_CARE_PROVIDER_SITE_OTHER): Payer: Medicare Other | Admitting: *Deleted

## 2020-12-02 DIAGNOSIS — Z23 Encounter for immunization: Secondary | ICD-10-CM | POA: Diagnosis not present

## 2020-12-25 ENCOUNTER — Other Ambulatory Visit: Payer: Self-pay

## 2020-12-25 ENCOUNTER — Ambulatory Visit
Admission: RE | Admit: 2020-12-25 | Discharge: 2020-12-25 | Disposition: A | Discharge status: Home / Self Care | Payer: Medicare Other | Source: Ambulatory Visit | Attending: Family Medicine | Admitting: Family Medicine

## 2020-12-25 VITALS — BP 137/83 | HR 100 | Temp 98.3°F | Resp 18 | Ht 66.5 in | Wt 284.0 lb

## 2020-12-25 DIAGNOSIS — G35 Multiple sclerosis: Secondary | ICD-10-CM

## 2020-12-25 DIAGNOSIS — R059 Cough, unspecified: Secondary | ICD-10-CM

## 2020-12-25 DIAGNOSIS — B349 Viral infection, unspecified: Secondary | ICD-10-CM

## 2020-12-25 DIAGNOSIS — J3489 Other specified disorders of nose and nasal sinuses: Secondary | ICD-10-CM

## 2020-12-25 NOTE — ED Triage Notes (Signed)
Congestion, cough, runny nose.  Has had a covid test done on wed. waiting on results

## 2020-12-25 NOTE — Discharge Instructions (Signed)
Your COVID and Flu tests are pending.  You should self quarantine until the test results are back.    Take Tylenol or ibuprofen as needed for fever or discomfort.  Rest and keep yourself hydrated.    Follow-up with your primary care provider if your symptoms are not improving.     

## 2020-12-25 NOTE — ED Provider Notes (Signed)
[place]   [id number] 12/25/20 Arrival Time: 1436   CC: COVID symptoms  SUBJECTIVE: History from: patient.  Katrina Montes is a 50 y.o. female who presents with nasal congestion, cough, rhinorrhea. Denies sick exposure to COVID, flu or strep. Denies recent travel. Has negative history of Covid. Has completed Covid vaccines. Has not taken OTC medications for this. There are no aggravating or alleviating factors. Denies previous symptoms in the past. Denies fever, chills, fatigue, sinus pain, sore throat, SOB, wheezing, chest pain, nausea, changes in bowel or bladder habits. Patient has hx MS and diabetes.  ROS: As per HPI.  All other pertinent ROS negative.     Past Medical History:  Diagnosis Date  . Anemia   . Anxiety   . Back pain   . Carpal tunnel syndrome   . Constipation   . Depression   . Diabetes mellitus   . Hypertension   . Joint pain   . Lactose intolerance   . Multiple sclerosis ([place])   . Optic neuritis   . Shortness of breath   . Swallowing difficulty   . Swelling of both lower extremities   . Vision, loss, sudden   . Vitamin D deficiency    Past Surgical History:  Procedure Laterality Date  . BREAST REDUCTION SURGERY    . CYST EXCISION    . ovarian cyst removed    . TUBAL LIGATION    . WISDOM TOOTH EXTRACTION     No Known Allergies No current facility-administered medications on file prior to encounter.   Current Outpatient Medications on File Prior to Encounter  Medication Sig Dispense Refill  . APPLE CIDER VINEGAR PO Take 1 tablet by mouth as needed.     . Blood Glucose Monitoring Suppl (BLOOD GLUCOSE SYSTEM PAK) KIT Please dispense as Guide Me System. Use as directed to monitor FSBS 3x daily. Dx: E11.9. 100 each 3  . cyclobenzaprine (FLEXERIL) 10 MG tablet TAKE 1 Tablet BY MOUTH 4 TIMES DAILY AS NEEDED  3  . DiphenhydrAMINE HCl (BENADRYL ALLERGY PO) Take by mouth as needed.    . docusate sodium (COLACE) 50 MG capsule Take 1  capsule (50 mg total) by mouth 2 (two) times daily. 60 capsule 11  . ELDERBERRY PO Take 1,150 mg by mouth daily.    . ferrous sulfate (EQL SLOW RELEASE IRON) 160 (50 Fe) MG TBCR SR tablet Take 1 tablet (160 mg total) by mouth daily. 30 tablet 3  . gabapentin (NEURONTIN) 300 MG capsule Take 365m BID and 6015mat bedtime 120 capsule 11  . Glucose Blood (BLOOD GLUCOSE TEST STRIPS) STRP Please dispense as Guide Me System. Use as directed to monitor FSBS 3x daily. Dx: E11.9. 100 each 3  . HYDROcodone-acetaminophen (NORCO) 7.5-325 MG tablet Take 1 tablet by mouth every 6 (six) hours as needed for moderate pain. 45 tablet 0  . ibuprofen (ADVIL,MOTRIN) 200 MG tablet Take 200 mg by mouth every 6 (six) hours as needed.    . Insulin Glargine (BASAGLAR KWIKPEN) 100 UNIT/ML SOPN INJECT 25-35 UNITS UNDER THE SKIN EVERY NIGHT 5 pen 3  . JANUMET 50-500 MG tablet Take 1 tablet by mouth 2 (two) times daily. 180 tablet 0  . Lancets MISC Please dispense as Guide Me System. Use as directed to monitor FSBS 3x daily. Dx: E11.9. 100 each 3  . lisinopril-hydrochlorothiazide (ZESTORETIC) 20-12.5 MG tablet TAKE 1 TABLET BY MOUTH DAILY 90 tablet 1  . meclizine (ANTIVERT) 25 MG tablet Take 1 tablet (25  mg total) by mouth 3 (three) times daily as needed for dizziness. 30 tablet 6  . Multiple Vitamin (MULTIVITAMIN) capsule Take 1 capsule by mouth daily.    . norgestimate-ethinyl estradiol (ESTARYLLA) 0.25-35 MG-MCG tablet Take 1 tablet by mouth daily. 84 tablet 3  . ocrelizumab (OCREVUS) 300 MG/10ML injection Inject into the vein every 6 (six) months.    . rizatriptan (MAXALT) 10 MG tablet Take 1 tablet (10 mg total) by mouth as needed for migraine. May repeat in 2 hours if needed. Max dose: 2/24 hr or 15/30 days. 45 tablet 3  . topiramate (TOPAMAX) 100 MG tablet TAKE 1 TABLET(100 MG) BY MOUTH TWICE DAILY 180 tablet 3  . venlafaxine XR (EFFEXOR XR) 75 MG 24 hr capsule Take 1 capsule (75 mg total) by mouth daily with breakfast.  30 capsule 11  . VITAMIN D PO Take 5,000 Units by mouth daily.    . Vitamin D, Ergocalciferol, (DRISDOL) 1.25 MG (50000 UNIT) CAPS capsule Take 1 capsule (50,000 Units total) by mouth every 7 (seven) days. 26 capsule 0   Social History   Socioeconomic History  . Marital status: Single    Spouse name: Not on file  . Number of children: 0  . Years of education: College  . Highest education level: Not on file  Occupational History  . 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 Smoking status: Never Smoker  . Smokeless tobacco: Never Used  Substance and Sexual Activity  . Alcohol use: No  . Drug use: No  . Sexual activity: Not Currently    Birth control/protection: Pill  Other Topics Concern  . Not on file  Social History Narrative   Live at home with parents.   Right handed.   Uses caffeine sparingly.   Social Determinants of Health   Financial Resource Strain: Not on file  Food Insecurity: Not on file  Transportation Needs: Not on file  Physical Activity: Not on file  Stress: Not on file  Social Connections: Not on file  Intimate Partner Violence: Not on file   Family History  Problem Relation Age of Onset  . Cancer Mother   . Diabetes Mother   . Hearing loss Mother   . Thyroid nodules Mother   . Anemia Mother   . Kidney disease Mother   . Hypertension Mother   . Obesity Mother   . Hyperlipidemia Father   . High blood pressure Father   . Sleep apnea Father   . Anemia Sister   . Arthritis Maternal Grandmother   . Arthritis Maternal Grandfather   . Arthritis Paternal Grandmother   . Kidney disease Paternal Grandmother   . Hypertension Paternal Grandmother   . Arthritis Paternal Grandfather   . Vision loss Paternal Grandfather   . Stroke Paternal Grandfather   . Hyperlipidemia Paternal Grandfather   . Anemia Sister     OBJECTIVE:  Vitals:   12/25/20 1457 12/25/20 1458  BP: 137/83   Pulse: 100   Resp: 18   Temp: 98.3 F (36.8 C)   TempSrc: Oral    SpO2: 97%   Weight:  284 lb (128.8 kg)  Height:  5' 6.5" (1.689 m)     General appearance: alert; appears fatigued, but nontoxic; speaking in full sentences and tolerating own secretions HEENT: NCAT; Ears: EACs clear, TMs pearly gray; Eyes: PERRL.  EOM grossly intact. Sinuses: nontender; Nose: nares patent with clear rhinorrhea, Throat: oropharynx erythematous, cobblestoning present, tonsils non erythematous or enlarged, uvula midline  Neck:  supple without LAD Lungs: unlabored respirations, symmetrical air entry; cough: absent; no respiratory distress; CTAB Heart: regular rate and rhythm.  Radial pulses 2+ symmetrical bilaterally Skin: warm and dry Psychological: alert and cooperative; normal mood and affect  LABS:  No results found for this or any previous visit (from the past 24 hour(s)).   ASSESSMENT & PLAN:  1. Viral illness   2. Cough   3. Rhinorrhea   4. Multiple sclerosis ([address])    Continue supportive care at home COVID tests pending.  It will take between 2-3 days for test results. Someone will contact you regarding abnormal results.   Work note provided Patient should remain in quarantine until they have received Covid results.  If negative you may resume normal activities (go back to work/school) while practicing hand hygiene, social distance, and mask wearing.  If positive, patient should remain in quarantine for at least 5 days from symptom onset AND greater than 72 hours after symptoms resolution (absence of fever without the use of fever-reducing medication and improvement in respiratory symptoms), whichever is longer Get plenty of rest and push fluids Use OTC zyrtec for nasal congestion, runny nose, and/or sore throat Use OTC flonase for nasal congestion and runny nose Use medications daily for symptom relief Use OTC medications like ibuprofen or tylenol as needed fever or pain Call or go to the ED if you have any new or worsening symptoms such as fever, worsening  cough, shortness of breath, chest tightness, chest pain, turning blue, changes in mental status.  Reviewed expectations re: course of current medical issues. Questions answered. Outlined signs and symptoms indicating need for more acute intervention. Patient verbalized understanding. After Visit Summary given.         Faustino Congress, NP 12/25/20 [phone]

## 2021-01-19 ENCOUNTER — Ambulatory Visit: Payer: Medicare Other | Admitting: Neurology

## 2021-01-19 ENCOUNTER — Encounter: Payer: Self-pay | Admitting: Neurology

## 2021-01-19 VITALS — BP 155/95 | HR 86 | Ht 66.0 in | Wt 284.0 lb

## 2021-01-19 DIAGNOSIS — G43009 Migraine without aura, not intractable, without status migrainosus: Secondary | ICD-10-CM

## 2021-01-19 DIAGNOSIS — G35 Multiple sclerosis: Secondary | ICD-10-CM

## 2021-01-19 DIAGNOSIS — G4733 Obstructive sleep apnea (adult) (pediatric): Secondary | ICD-10-CM

## 2021-01-19 DIAGNOSIS — E559 Vitamin D deficiency, unspecified: Secondary | ICD-10-CM

## 2021-01-19 MED ORDER — VENLAFAXINE HCL ER 75 MG PO CP24: 75.0000 mg | ORAL_CAPSULE | Freq: Every day | ORAL | 11 refills | Status: DC

## 2021-01-19 NOTE — Patient Instructions (Signed)
Keep appointment for Ocrevus Check labs today  Continue current medications Continue to work on your sugars See you back in 6 months

## 2021-01-19 NOTE — Progress Notes (Signed)
PATIENT: Katrina Montes DOB: 1971-06-01  REASON FOR VISIT: follow up HISTORY FROM: patient  HISTORY OF PRESENT ILLNESS: Today 01/19/21  HISTORY  Katrina Montes 31 right-handed African-American female, accompanied by her mother, referred by her primary care physician Dr. Buelah Manis for evaluation of right optic neuritis, abnormal MRI of the brain, suspicious for relapsing remitting multiple sclerosis, initial evaluation was on Jan 14 2015.  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  Relapsing Remitting Multiple Sclerosis was diagnosed since January 2016: Based on her abnormal MRI of the brain, there was also cervical spinal cord involvement  MRI of the brain without contrast at [facility] imaging January 2016 showed multiple periventricular white matter disease, significant involvement of corpus callosum, perpendicular to ventricle, consistent with multiple sclerosis, now was also suspicious for right optic nerve edema, with hyperintensity signal at T2, FLAIR, no contrast enhancement, MRA of brain was normal.  MRI of the cervical spine with and without contrast in Jan 2016: showing hyperintense foci within the spinal cord posteriorly at C4 and posteriolaterally at C7-T1 consistent with multiple sclerosis plaques. There were no enhancing foci.  Spinal fluid testing January 13 2015, total protein was 708, RBC was 1600, RBC was 7,, cloudy reddish-looking,elevated IgG 17.7, IgG index 0.97, IgG synthetic rate  238, 0 oligoclonal bands,  Visual evoked potential showed prolongation on the right side.  She was treated with IVSolu-Medrol for 3 daysin Jan2016, which onlymild improved her right vision, has significant worsening of her blood glucose level.She was treated with Achtar injection in Feb 2016,but still having significant residual visual difficulty.  JC virus was positive with titer of 1.1 3 (Jan 14 2015), laboratory evaluation showed normal or negative CPK, HIV, protein electrophoresis, positive varicella-zoster virus antibody, mild elevated ESR, C-reactive protein, hepatitis panel, Lyme titer,showed anemia, hemoglobin 11.8, otherwise normal CBC, normal CMP, with exception of elevated glucose, normal TSH,  She was treated withTysarbrilsinceMarch 14th, 2016,was switched to ocrelizumab since February 2020  MRI of the brain with and without contrast in April 2019 showed multiple supratentorium lesions, no contrast enhancement, no significant change compared to previous MRI of the brain  Vision:History of right optic neuritis, with partial recovery, now complains of blurry vision, difficult to read anything without bleeders or magnified glasses, difficult to read through computer screen or watch TV, visual acuity, OS 20/40, OD 20/50-1  Cognitive impairment:has to take frequent notes, difficulty to concentrate, to express herself.  Gait abnormality: Treated easily, generalized weakness, also complicated by her low back pain,leftsided radicular pain,  Spasticity:Bilateral lower extremity spasticity, is taking gabapentin 300 mg 3 times a day previously tried and failed baclofen  Fatigue: Severe fatigue, also complicated by her depression anxiety chronic insomnia, body achy pain, limit her daily activity more than 50%,she has heavy menstruation-like cycle, chronic anemia, recent hemoglobin was 10.6.  Chronic migraine: She also has history of chronic migraine headaches,  retro-orbital area severe pounding headache with associated light noise sensitivity, and happened frequently,up to 3 to four times each week,  Maxalt as needed was helpful,  Severe low back pain, radiating pain to left lower extremity, MRI of lumbar in July 201 there is minimum anterolisthesis of L4 upon L5, due to severe facet hypertrophy, associated with left ligamentum flavum hypertrophy and disc protrusion, there is moderate Left foraminal and lateral recess stenosis, potential for left L4-5 nerve root compression.  Diabetic peripheral neuropathy, Progressive worsening bilateral feet paresthesia, nowtaking insulin  Depression anxiety, chronic insomnia, polypharmacy treatment: Trazodone 50 mg every night, gabapentin 300 mg 3 times a day, Topamax 100 mg twice a day, Flexeril as needed  Weaknessin hands: EMG nerve conduction study from outside clinic confirmed the diagnosis of severe right median neuropathy,  Urinary frequency: stable Hearing Loss-she reports occasional ringing in her ears, notices her hearing is not as good as it used to be, having to read lips,this is embarrassing to her  Mild obstructive sleep apnea, using CPAP machine,  Vitamin D deficiency: Vitamin D level was 23, on supplement  Update July 19, 2020 SS: Remains on [place], when last seen was referred to ENT, Effexor was added for depression.  MRI of the brain with and without contrast in February 2021 showed no significant change compared to previous in April 2019.  Next Ocrevus in 2 weeks. Lives with parents, drives a car.   Depression-improved with Effexor XR 75 mg daily Migraines-improved, 3 severe monthly, 1 mild weekly, on Topamax, will take Advil, has not been using Maxalt Low back pain, radiates left lower extremity- chronic, worsening last several months, using cane, no falls, spells of feeling like losing balance, dizziness spell, wants refill of meclizine, left low back pain, radiates to left  leg, isn't taking gabapentin, has Norco PRN  (MRI of lumbar in July 2017 there is minimum anterolisthesis of L4 upon L5, due to severe facet hypertrophy, associated with left ligamentum flavum hypertrophy and disc protrusion, there is moderate Left foraminal and lateral recess stenosis, potential for left L4-5 nerve root compression) Diabetes-due for A1c recheck, doesn't feel out of control, trying to watch diet Vitamin D deficiency-on supplement Neuropathy- worse at night, numbness in feet Hearing loss-refer to ENT, told slight hearing loss, hearing aid is option Urinary urgency/frequency: Stable OSA-not using CPAP, need to order something  Update January 19, 2021 SS: Here today for follow-up unaccompanied, next Ocrevus is scheduled for March 3.  Doing well on it, feels she is overall stable.  Lives with her mom and dad, last several months more stress at home.  Diabetes has been out of control. Always feels her symptoms worsening before her next infusion.  In August 2021 IgG 1514, IgA 233, IgM 25.  OSA-never started CPAP, at the time, could not afford it, is interested in restarting process again  Vit D Def, level was 26 in Dec, PCP started high dose   Labs in Dec: CBC no significant abnormalities, CMP glucose 277  DM: Poorly controlled recent A1c 12.3, Basaglar increased, starting exercise at home, treadmill, has lost 10 pounds  Depression: Doing better now, has had personal stress, home life, remains on Effexor XR 75 mg daily  Migraines: Few more lately, nearing time of infusion, also poorly controlled diabetes, remains on Topamax, can have dizziness with migraines, takes meclizine PRN with good benefit  Low back pain, radiates to left lower extremity: Chronic, stable, no worse, only during the night, some numbness down the leg, always worse around the time of her Ocrevus infusion beforehand, after last infusion, improved, 1 fall few months ago, uses cane for  steps  (MRI of lumbar in  July 2017 there is minimum anterolisthesis of L4 upon L5, due to severe facet hypertrophy, associated with left ligamentum flavum hypertrophy and disc protrusion, there is moderate Left foraminal and lateral recess stenosis, potential for left L4-5 nerve root compression)  Urinary urgency/frequency: a lot more lately, poorly controlled diabetes, trying to drink more water  REVIEW OF SYSTEMS: Out of a complete 14 system review of symptoms, the patient complains only of the following symptoms, and all other reviewed systems are negative.  Back pain gait abnormality, numbness, depression, headache  ALLERGIES: No Known Allergies  HOME MEDICATIONS: Outpatient Medications Prior to Visit  Medication Sig Dispense Refill  . APPLE CIDER VINEGAR PO Take 1 tablet by mouth as needed.     . Blood Glucose Monitoring Suppl (BLOOD GLUCOSE SYSTEM PAK) KIT Please dispense as Guide Me System. Use as directed to monitor FSBS 3x daily. Dx: E11.9. 100 each 3  . cyclobenzaprine (FLEXERIL) 10 MG tablet TAKE 1 Tablet BY MOUTH 4 TIMES DAILY AS NEEDED  3  . DiphenhydrAMINE HCl (BENADRYL ALLERGY PO) Take by mouth as needed.    . docusate sodium (COLACE) 50 MG capsule Take 1 capsule (50 mg total) by mouth 2 (two) times daily. 60 capsule 11  . ELDERBERRY PO Take 1,150 mg by mouth daily.    . ferrous sulfate (EQL SLOW RELEASE IRON) 160 (50 Fe) MG TBCR SR tablet Take 1 tablet (160 mg total) by mouth daily. 30 tablet 3  . gabapentin (NEURONTIN) 300 MG capsule Take $RemoveBefo'300mg'fWFLeTSjMQn$  BID and $Remov'600mg'OOmlHL$  at bedtime 120 capsule 11  . Glucose Blood (BLOOD GLUCOSE TEST STRIPS) STRP Please dispense as Guide Me System. Use as directed to monitor FSBS 3x daily. Dx: E11.9. 100 each 3  . HYDROcodone-acetaminophen (NORCO) 7.5-325 MG tablet Take 1 tablet by mouth every 6 (six) hours as needed for moderate pain. 45 tablet 0  . ibuprofen (ADVIL,MOTRIN) 200 MG tablet Take 200 mg by mouth every 6 (six) hours as needed.    . Insulin Glargine (BASAGLAR  KWIKPEN) 100 UNIT/ML SOPN INJECT 25-35 UNITS UNDER THE SKIN EVERY NIGHT 5 pen 3  . JANUMET 50-500 MG tablet Take 1 tablet by mouth 2 (two) times daily. 180 tablet 0  . Lancets MISC Please dispense as Guide Me System. Use as directed to monitor FSBS 3x daily. Dx: E11.9. 100 each 3  . lisinopril-hydrochlorothiazide (ZESTORETIC) 20-12.5 MG tablet TAKE 1 TABLET BY MOUTH DAILY 90 tablet 1  . meclizine (ANTIVERT) 25 MG tablet Take 1 tablet (25 mg total) by mouth 3 (three) times daily as needed for dizziness. 30 tablet 6  . Multiple Vitamin (MULTIVITAMIN) capsule Take 1 capsule by mouth daily.    . norgestimate-ethinyl estradiol (ESTARYLLA) 0.25-35 MG-MCG tablet Take 1 tablet by mouth daily. 84 tablet 3  . ocrelizumab (OCREVUS) 300 MG/10ML injection Inject into the vein every 6 (six) months.    . rizatriptan (MAXALT) 10 MG tablet Take 1 tablet (10 mg total) by mouth as needed for migraine. May repeat in 2 hours if needed. Max dose: 2/24 hr or 15/30 days. 45 tablet 3  . topiramate (TOPAMAX) 100 MG tablet TAKE 1 TABLET(100 MG) BY MOUTH TWICE DAILY 180 tablet 3  . VITAMIN D PO Take 5,000 Units by mouth daily.    . Vitamin D, Ergocalciferol, (DRISDOL) 1.25 MG (50000 UNIT) CAPS capsule Take 1 capsule (50,000 Units total) by mouth every 7 (seven) days. 26 capsule 0  . venlafaxine XR (EFFEXOR XR) 75 MG  24 hr capsule Take 1 capsule (75 mg total) by mouth daily with breakfast. 30 capsule 11   No facility-administered medications prior to visit.    PAST MEDICAL HISTORY: Past Medical History:  Diagnosis Date  . Anemia   . Anxiety   . Back pain   . Carpal tunnel syndrome   . Constipation   . Depression   . Diabetes mellitus   . Hypertension   . Joint pain   . Lactose intolerance   . Multiple sclerosis ([place])   . Optic neuritis   . Shortness of breath   . Swallowing difficulty   . Swelling of both lower extremities   . Vision, loss, sudden   . Vitamin D deficiency     PAST SURGICAL HISTORY: Past  Surgical History:  Procedure Laterality Date  . BREAST REDUCTION SURGERY    . CYST EXCISION    . ovarian cyst removed    . TUBAL LIGATION    . WISDOM TOOTH EXTRACTION      FAMILY HISTORY: Family History  Problem Relation Age of Onset  . Cancer Mother   . Diabetes Mother   . Hearing loss Mother   . Thyroid nodules Mother   . Anemia Mother   . Kidney disease Mother   . Hypertension Mother   . Obesity Mother   . Hyperlipidemia Father   . High blood pressure Father   . Sleep apnea Father   . Anemia Sister   . Arthritis Maternal Grandmother   . Arthritis Maternal Grandfather   . Arthritis Paternal Grandmother   . Kidney disease Paternal Grandmother   . Hypertension Paternal Grandmother   . Arthritis Paternal Grandfather   . Vision loss Paternal Grandfather   . Stroke Paternal Grandfather   . Hyperlipidemia Paternal Grandfather   . Anemia Sister     SOCIAL HISTORY: Social History   Socioeconomic History  . Marital status: Single    Spouse name: Not on file  . Number of children: 0  . Years of education: College  . Highest education level: Not on file  Occupational History  . 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 Smoking status: Never Smoker  . Smokeless tobacco: Never Used  Substance and Sexual Activity  . Alcohol use: No  . Drug use: No  . Sexual activity: Not Currently    Birth control/protection: Pill  Other Topics Concern  . Not on file  Social History Narrative   Live at home with parents.   Right handed.   Uses caffeine sparingly.   Social Determinants of Health   Financial Resource Strain: Not on file  Food Insecurity: Not on file  Transportation Needs: Not on file  Physical Activity: Not on file  Stress: Not on file  Social Connections: Not on file  Intimate Partner Violence: Not on file   PHYSICAL EXAM  Vitals:   01/19/21 0813  BP: (!) 155/95  Pulse: 86  Weight: 284 lb (128.8 kg)  Height: $Remove'5\' 6"'xhEYJFI$  (1.676 m)   Body mass index is  45.84 kg/m.  Generalized: Well developed, in no acute distress  Neurological examination  Mentation: Alert oriented to time, place, history taking. Follows all commands speech and language fluent Cranial nerve II-XII: Pupils were equal round reactive to light. Extraocular movements were full, visual field were full on confrontational test. Facial sensation and strength were normal. Head turning and shoulder shrug were normal and symmetric. Equal moderate grip strengths bilaterally  Motor: 4/5 left hip flexion, left knee  extension Sensory: Sensory testing is intact to soft touch on all 4 extremities. No evidence of extinction is noted.  Coordination: Cerebellar testing reveals good finger-nose-finger and heel-to-shin bilaterally.  Gait and station: Has to push off from seated position to stand, gait is slightly wide-based, antalgic on the left Reflexes: Deep tendon reflexes are symmetric 1+ bilaterally  DIAGNOSTIC DATA (LABS, IMAGING, TESTING) - I reviewed patient records, labs, notes, testing and imaging myself where available.  Lab Results  Component Value Date   WBC 6.6 11/19/2020   HGB 11.9 11/19/2020   HCT 37.1 11/19/2020   MCV 83.4 11/19/2020   PLT 270 11/19/2020      Component Value Date/Time   NA 135 11/19/2020 0931   NA 137 07/19/2020 0813   K 4.4 11/19/2020 0931   CL 100 11/19/2020 0931   CO2 24 11/19/2020 0931   GLUCOSE 277 (H) 11/19/2020 0931   BUN 8 11/19/2020 0931   BUN 12 07/19/2020 0813   CREATININE 0.67 11/19/2020 0931   CALCIUM 9.1 11/19/2020 0931   PROT 6.9 11/19/2020 0931   PROT 7.2 07/19/2020 0813   ALBUMIN 3.8 07/19/2020 0813   AST 25 11/19/2020 0931   ALT 16 11/19/2020 0931   ALKPHOS 70 07/19/2020 0813   BILITOT 0.4 11/19/2020 0931   BILITOT 0.4 07/19/2020 0813   GFRNONAA 104 07/19/2020 0813   GFRAA 120 07/19/2020 0813   Lab Results  Component Value Date   CHOL 182 11/19/2020   HDL 74 11/19/2020   LDLCALC 87 11/19/2020   TRIG 116 11/19/2020    CHOLHDL 2.5 11/19/2020   Lab Results  Component Value Date   HGBA1C 12.3 (H) 11/19/2020   No results found for: VITAMINB12 Lab Results  Component Value Date   TSH 1.050 10/21/2018   ASSESSMENT AND PLAN 50 y.o. year old female  has a past medical history of Anemia, Anxiety, Back pain, Carpal tunnel syndrome, Constipation, Depression, Diabetes mellitus, Hypertension, Joint pain, Lactose intolerance, Multiple sclerosis ([place]), Optic neuritis, Shortness of breath, Swallowing difficulty, Swelling of both lower extremities, Vision, loss, sudden, and Vitamin D deficiency. here with:  1.  Relapsing remitting multiple sclerosis -Ocrevus since February 2020, overall stable, next infusion 3/3 -MRI of the brain with and without contrast in February 2021 showed no significant change compared to previous in April 2019 -Recent CBC, CMP reviewed, glucose 277 -IgG 1514, IgA 233, IgM 11 Aug 2020, since IgM borderline low, will recheck today -Check MRI of the brain with and without contrast at next visit  2.  Vitamin D deficiency -Level was 26, PCP just started high dose treatment for 6 months  3.  Diabetes, poorly controlled -Poorly controlled again, recent A1c 12.4, working to do better, exercising, has lost 10 pounds, working on diet control  4.  Depression -Improved, continue Effexor XR 75 mg daily  5.  Migraine headaches -Few more lately due to infusion, wait to see if things improve -Continue Topamax 100 mg, 2 tablets at bedtime -Continue Maxalt as needed (take this if needed for severe headache)  6.  Dizzy spells with walking -Refill meclizine PRN   7.  Low back pain, radiating to left lower extremity, chronic -Ocrevus due in 1 month, historically, pain improves after infusion, has been stable lately  -May consider repeat MRI of lumbar spine in future if needed -MRI of lumbar in July 2017 there is minimum anterolisthesis of L4 upon L5, due to severe facet hypertrophy, associated with  left ligamentum flavum hypertrophy and disc protrusion, there  is moderate Left foraminal and lateral recess stenosis, potential for left L4-5 nerve root compression.  8.  OSA -Last saw Dr. Rexene Alberts in 2020, never started CPAP -Will send back for revisit to start process again  -Hopefully OSA treatment will help headaches, chronic fatigue  She will follow-up in 6 months with Dr. Krista Blue, or sooner if needed.  I spent 30 minutes of face-to-face and non-face-to-face time with patient.  This included previsit chart review, lab review, study review, order entry, electronic health record documentation, patient education.  Butler Denmark, AGNP-C, DNP 01/19/2021, 8:50 AM [facility] [address], [place], [place] [address] [phone]

## 2021-01-20 ENCOUNTER — Telehealth: Payer: Self-pay

## 2021-01-20 LAB — IGG, IGA, IGM
IgA/Immunoglobulin A, Serum: 248 mg/dL (ref 87–352)
IgG (Immunoglobin G), Serum: 1487 mg/dL (ref 586–1602)
IgM (Immunoglobulin M), Srm: 30 mg/dL (ref 26–217)

## 2021-01-20 NOTE — Telephone Encounter (Signed)
-----   Message from Glean Salvo, NP sent at 01/20/2021  7:41 AM EST ----- Sent my chart message: Katrina Montes,  Labs are normal, continue Ocrevus every 6 months. Take Care,  Maralyn Sago

## 2021-01-22 ENCOUNTER — Other Ambulatory Visit: Payer: Self-pay | Admitting: Family Medicine

## 2021-01-22 DIAGNOSIS — Z794 Long term (current) use of insulin: Secondary | ICD-10-CM

## 2021-01-22 DIAGNOSIS — E119 Type 2 diabetes mellitus without complications: Secondary | ICD-10-CM

## 2021-02-02 ENCOUNTER — Encounter: Payer: Self-pay | Admitting: Family Medicine

## 2021-02-02 NOTE — Telephone Encounter (Signed)
I am okay with either for her screening   If she wants gold standard send to GI for colonoscopy - Dx colon cancer screen   Okay to order Cologuard if pt prefers this route

## 2021-02-24 ENCOUNTER — Other Ambulatory Visit: Payer: Self-pay

## 2021-02-24 ENCOUNTER — Ambulatory Visit: Payer: Medicare Other | Admitting: Neurology

## 2021-02-24 ENCOUNTER — Encounter: Payer: Self-pay | Admitting: Neurology

## 2021-02-24 VITALS — BP 155/96 | HR 81 | Ht 66.5 in | Wt 280.0 lb

## 2021-02-24 DIAGNOSIS — G4700 Insomnia, unspecified: Secondary | ICD-10-CM

## 2021-02-24 DIAGNOSIS — G4733 Obstructive sleep apnea (adult) (pediatric): Secondary | ICD-10-CM | POA: Diagnosis not present

## 2021-02-24 DIAGNOSIS — G4719 Other hypersomnia: Secondary | ICD-10-CM

## 2021-02-24 NOTE — Progress Notes (Signed)
Subjective:    Patient ID: Katrina Montes is a 50 y.o. female.  HPI     Interim history:    Ms. Katrina Montes is a 50 year old right-handed woman with an underlying medical history of vitamin D deficiency, multiple sclerosis (sees Dr. Krista Blue and Butler Denmark, NP), hypertension, diabetes, depression, anxiety, anemia, and morbid obesity with BMI of over 81, who presents for follow-up consultation of her sleep disorder, after a gap of 2 years. The patient is unaccompanied today. I first met her at the request of Dr. Jearld Lesch on 02/26/2019, at which time the patient reported snoring and excessive daytime somnolence.  She was advised to proceed with a sleep study.  She had a baseline polysomnogram on 05/30/2019 which showed a sleep efficiency of 75.6%, sleep latency of 44.5 minutes, REM latency of 71 minutes.  She had a increased percentage of stage I sleep, a mildly reduced percentage of stage II sleep, a mildly increased percentage of s slow-wave sleep and a normal percentage of REM sleep at 18%.  Total AHI based primarily on hypopneas was 8.3/h, REM AHI 40.4/h, supine AHI 6.1/h, average oxygen saturation was 96%, nadir was 85% with time below 89% saturation of 1 minute for the night.  She did not have any significant EKG or EEG changes, no significant PLM's and mild snoring was noted.     She was advised to consider AutoPap therapy.  She did not pursue AutoPap therapy at the time.   Today, 02/24/21: She reports having significant difficulty maintaining sleep.  She goes to sleep fairly well but does not stay asleep and feels like she only takes Naps at night.  She does not have a very set bedtime schedule because she may not feel sleepy at the same time every day.  She does not watch TV in the bedroom, she does not drink caffeine on a daily basis, she does not drink alcohol or smoke.  She lives with her parents.  She is single, no children, she does not work currently, she has been on Leonore for her MS.   She tolerates it well.  She has tried over-the-counter medication for sleep including melatonin without obvious success, she is no longer on trazodone.  She has had several medication changes.  She does not wake up rested.  Her Epworth sleepiness score is 17 out of 24 today.  She would like to consider AutoPap therapy for her sleep apnea.  She is working on weight loss.  She is also working on diabetes control.  Latest A1c from 11/19/2020 was 12.3.  The patient's allergies, current medications, family history, past medical history, past social history, past surgical history and problem list were reviewed and updated as appropriate.   Previously:   02/26/19: (She) reports snoring and excessive daytime somnolence. I reviewed your office note from 01/14/2019. Her Epworth sleepiness score is 15 out of 24 today, fatigue score is 53 out of 63 today. She is single and lives with her parents, no children. She does not sleep well through the night. She does not necessarily have a set schedule for her bedtime, she may be in bed as early as 7 or 8. 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 She doesn't typically nap during the day. Rise time is between 5:30 and 6:45 AM. She does not have night to night nocturia but has occasional morning headaches, her father has sleep apnea and has a CPAP machine, paternal aunt also has a sleep apnea diagnosis.  For sleep difficulty at night she has taken melatonin over-the-counter, some years ago she also tried trazodone. She has some anxiety at night, she has woken up with a sense of gasping for air and this scared her. She denies any palpitations or chest pain. She has symptoms of neuropathy including tingling and numbness, she denies frank restless leg symptoms. Her weight has been fluctuating. Her diabetes has not been under good control lately.  Her Past Medical History Is Significant For: Past Medical History:  Diagnosis Date  . Anemia   . Anxiety   . Back pain   .  Carpal tunnel syndrome   . Constipation   . Depression   . Diabetes mellitus   . Hypertension   . Joint pain   . Lactose intolerance   . Multiple sclerosis ([place])   . Optic neuritis   . Shortness of breath   . Swallowing difficulty   . Swelling of both lower extremities   . Vision, loss, sudden   . Vitamin D deficiency     Her Past Surgical History Is Significant For: Past Surgical History:  Procedure Laterality Date  . BREAST REDUCTION SURGERY    . CYST EXCISION    . ovarian cyst removed    . TUBAL LIGATION    . WISDOM TOOTH EXTRACTION      Her Family History Is Significant For: Family History  Problem Relation Age of Onset  . Cancer Mother   . Diabetes Mother   . Hearing loss Mother   . Thyroid nodules Mother   . Anemia Mother   . Kidney disease Mother   . Hypertension Mother   . Obesity Mother   . Hyperlipidemia Father   . High blood pressure Father   . Sleep apnea Father   . Anemia Sister   . Arthritis Maternal Grandmother   . Arthritis Maternal Grandfather   . Arthritis Paternal Grandmother   . Kidney disease Paternal Grandmother   . Hypertension Paternal Grandmother   . Arthritis Paternal Grandfather   . Vision loss Paternal Grandfather   . Stroke Paternal Grandfather   . Hyperlipidemia Paternal Grandfather   . Anemia Sister     Her Social History Is Significant For: Social History   Socioeconomic History  . Marital status: Single    Spouse name: Not on file  . Number of children: 0  . Years of education: College  . Highest education level: Not on file  Occupational History  . 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 Smoking status: Never Smoker  . Smokeless tobacco: Never Used  Substance and Sexual Activity  . Alcohol use: No  . Drug use: No  . Sexual activity: Not Currently    Birth control/protection: Pill  Other Topics Concern  . Not on file  Social History Narrative   Live at home with parents.   Right handed.   Uses caffeine  sparingly.   Social Determinants of Health   Financial Resource Strain: Not on file  Food Insecurity: Not on file  Transportation Needs: Not on file  Physical Activity: Not on file  Stress: Not on file  Social Connections: Not on file    Her Allergies Are:  No Known Allergies:   Her Current Medications Are:  Outpatient Encounter Medications as of 02/24/2021  Medication Sig  . APPLE CIDER VINEGAR PO Take 1 tablet by mouth as needed.   . Blood Glucose Monitoring Suppl (BLOOD GLUCOSE SYSTEM PAK) KIT Please dispense as Guide Me System.  Use as directed to monitor FSBS 3x daily. Dx: E11.9.  Marland Kitchen cyclobenzaprine (FLEXERIL) 10 MG tablet TAKE 1 Tablet BY MOUTH 4 TIMES DAILY AS NEEDED  . DiphenhydrAMINE HCl (BENADRYL ALLERGY PO) Take by mouth as needed.  . docusate sodium (COLACE) 50 MG capsule Take 1 capsule (50 mg total) by mouth 2 (two) times daily.  Marland Kitchen ELDERBERRY PO Take 1,150 mg by mouth daily.  . ferrous sulfate (EQL SLOW RELEASE IRON) 160 (50 Fe) MG TBCR SR tablet Take 1 tablet (160 mg total) by mouth daily.  Marland Kitchen gabapentin (NEURONTIN) 300 MG capsule Take $RemoveBefo'300mg'QMddYqyfYds$  BID and $Remov'600mg'gxEhsv$  at bedtime  . Glucose Blood (BLOOD GLUCOSE TEST STRIPS) STRP Please dispense as Guide Me System. Use as directed to monitor FSBS 3x daily. Dx: E11.9.  . HYDROcodone-acetaminophen (NORCO) 7.5-325 MG tablet Take 1 tablet by mouth every 6 (six) hours as needed for moderate pain.  Marland Kitchen ibuprofen (ADVIL,MOTRIN) 200 MG tablet Take 200 mg by mouth every 6 (six) hours as needed.  . Insulin Glargine (BASAGLAR KWIKPEN) 100 UNIT/ML ADMINISTER 25 TO 35 UNITS UNDER THE SKIN EVERY NIGHT  . JANUMET 50-500 MG tablet Take 1 tablet by mouth 2 (two) times daily.  . Lancets MISC Please dispense as Guide Me System. Use as directed to monitor FSBS 3x daily. Dx: E11.9.  . lisinopril-hydrochlorothiazide (ZESTORETIC) 20-12.5 MG tablet TAKE 1 TABLET BY MOUTH DAILY  . meclizine (ANTIVERT) 25 MG tablet Take 1 tablet (25 mg total) by mouth 3 (three)  times daily as needed for dizziness.  . Multiple Vitamin (MULTIVITAMIN) capsule Take 1 capsule by mouth daily.  . norgestimate-ethinyl estradiol (ESTARYLLA) 0.25-35 MG-MCG tablet Take 1 tablet by mouth daily.  Marland Kitchen ocrelizumab (OCREVUS) 300 MG/10ML injection Inject into the vein every 6 (six) months.  . rizatriptan (MAXALT) 10 MG tablet Take 1 tablet (10 mg total) by mouth as needed for migraine. May repeat in 2 hours if needed. Max dose: 2/24 hr or 15/30 days.  Marland Kitchen topiramate (TOPAMAX) 100 MG tablet TAKE 1 TABLET(100 MG) BY MOUTH TWICE DAILY  . venlafaxine XR (EFFEXOR XR) 75 MG 24 hr capsule Take 1 capsule (75 mg total) by mouth daily with breakfast.  . VITAMIN D PO Take 5,000 Units by mouth daily.  . Vitamin D, Ergocalciferol, (DRISDOL) 1.25 MG (50000 UNIT) CAPS capsule Take 1 capsule (50,000 Units total) by mouth every 7 (seven) days.   No facility-administered encounter medications on file as of 02/24/2021.  :  Review of Systems:  Out of a complete 14 point review of systems, all are reviewed and negative with the exception of these symptoms as listed below:  Review of Systems  Neurological:       RM 1, alone. Sleep follow up. Last seen 02/26/2019. Not sleeping well at night. Cannot sleep laying back/feels like she cannot breathe. She never got CPAP machine d/t financial reasons. Would like to try and get on machine now.   Epworth Sleepiness Scale 0= would never doze 1= slight chance of dozing 2= moderate chance of dozing 3= high chance of dozing  Sitting and reading: 3 Watching TV:3 Sitting inactive in a public place (ex. Theater or meeting):1 As a passenger in a car for an hour without a break:2 Lying down to rest in the afternoon:3 Sitting and talking to someone:2 Sitting quietly after lunch (no alcohol):2 In a car, while stopped in traffic:1 Total:17     Objective:  Neurological Exam  Physical Exam Physical Examination:   Vitals:   02/24/21 0733  BP: Marland Kitchen)  155/96   Pulse: 81  SpO2: 98%    General Examination: The patient is a very pleasant 50 y.o. female in no acute distress. She appears well-developed and well-nourished and well groomed.   HEENT: Normocephalic, atraumatic, pupils are equal, round and reactive to light, extraocular tracking is good, speech is clear, maybe slightly dysarthric at times. Hearing is grossly intact. Face is symmetric. Airway examination reveals mild mouth dryness, moderate airway crowding with tonsils of about 2+, thicker soft palate, smaller airway entry noted. Mallampati is class III. Tongue protrudes centrally and palate elevates symmetrically.  Chest: Clear to auscultation without wheezing, rhonchi or crackles noted.  Heart: S1+S2+0, regular and normal without murmurs, rubs or gallops noted.   Abdomen: Soft, non-tender and non-distended with normal bowel sounds appreciated on auscultation.  Extremities: There is no pitting edema in the distal lower extremities bilaterally.   Skin: Warm and dry without trophic changes noted.  Musculoskeletal: exam reveals no obvious joint deformities, tenderness or joint swelling or erythema.   Neurologically:  Mental status: The patient is awake, alert and oriented in all 4 spheres. Her immediate and remote memory, attention, language skills and fund of knowledge are appropriate. There is no evidence of aphasia, agnosia, apraxia or anomia. Speech is clear with normal prosody and enunciation. Thought process is linear. Mood is normal and affect is normal.  Cranial nerves II - XII are as described above under HEENT exam.  Motor exam: Normal bulk, strength and tone is noted. There is no tremor. Fine motor skills and coordination: intact grossly .  Cerebellar testing: No dysmetria or intention tremor. There is no truncal or gait ataxia.  Sensory exam: intact to light touch in the upper and lower extremities.  Gait, station and balance: She stands easily. No veering to one side is  noted. No leaning to one side is noted. Posture is age-appropriate and stance is narrow based. Gait shows normal stride length and normal pace. No problems turning are noted.   Assessment and Plan:  In summary, Ellamae Illingworth is a very pleasant 50 year old female with an underlying medical history of vitamin D deficiency, multiple sclerosis, hypertension, diabetes, depression, anxiety, anemia, and morbid obesity with BMI of over 40, who presents for follow-up consultation of her sleep disturbance.  She has difficulty going to sleep and maintaining sleep, does not feel rested and has daytime somnolence.  Her baseline sleep study from 05/30/2019 showed overall mild obstructive sleep apnea but much more pronounced during REM sleep.  We talked about treatment options.  She would be willing to try AutoPap therapy.  I think this is a good option for her.  We also talked about the importance of healthy lifestyle and weight management.  She is working on weight loss.  We will initiate AutoPap therapy through a local DME company.  When she is set up she will need a follow-up appointment in sleep clinic in 31 to 89 days.  She is advised regarding the expectations of a compliance with AutoPap therapy not just to fulfill insurance criteria but also to reap maximum benefit from treatment.  We will arrange for follow-up accordingly.  She was advised to try melatonin again at night for sleep, 1 or 2 hours before her bedtime.  We talked about the importance of keeping a schedule and good sleep hygiene.  I answered all her questions today and the patient was in agreement.  I spent 30 minutes in total face-to-face time and in reviewing records during pre-charting, more than  50% of which was spent in counseling and coordination of care, reviewing test results, reviewing medications and treatment regimen and/or in discussing or reviewing the diagnosis of OSA, the prognosis and treatment options. Pertinent laboratory and imaging  test results that were available during this visit with the patient were reviewed by me and considered in my medical decision making (see chart for details).

## 2021-02-24 NOTE — Patient Instructions (Signed)
It was nice to see you again today..  I am sorry to hear that you are having a rough time with your sleep.  As discussed, we will set you up at home with a so called autoPAP machine for treatment of your overall mild obstructive sleep apnea.  The hope is that you will sleep better through the night and wake up better rested.   You can try Melatonin at night for sleep: take 3 mg, one to 2 hours before your bedtime. You can go up to 6 mg if needed. It is over the counter and comes in pill form, chewable form and spray, if you prefer.    You will need to follow-up in this clinic within 1 to 3 months after starting AutoPap.  You can follow-up with Maralyn Sago, NP as well.

## 2021-03-30 ENCOUNTER — Encounter: Payer: Self-pay | Admitting: Family Medicine

## 2021-03-30 ENCOUNTER — Encounter: Payer: Self-pay | Admitting: *Deleted

## 2021-03-31 NOTE — Telephone Encounter (Signed)
Please advise 

## 2021-06-24 ENCOUNTER — Encounter: Payer: Self-pay | Admitting: Neurology

## 2021-06-28 NOTE — Telephone Encounter (Signed)
Ross Ludwig, RN I do not see an order from then. I will go ahead and get this processed and reach out to the patient.         Previous Messages    ----- Message -----  From: Guy Begin, RN  Sent: 06/27/2021  11:50 AM EDT  To: [name]  Subject: cpap                                           Hey I received message from pt aobut she not having heard about her autopap machine.  Looks like this was done 02-24-21, do you have an order from then?    Katrina Montes   Female, 50 y.o., 02-Nov-1971   MRN:  [id number]  Thank you Delmer Islam

## 2021-07-21 ENCOUNTER — Ambulatory Visit: Payer: Medicare Other | Admitting: Neurology

## 2021-08-01 ENCOUNTER — Ambulatory Visit: Payer: Medicare Other | Admitting: Neurology

## 2021-08-01 ENCOUNTER — Encounter: Payer: Self-pay | Admitting: Neurology

## 2021-08-01 VITALS — BP 144/89 | HR 85 | Ht 66.5 in | Wt 279.0 lb

## 2021-08-01 DIAGNOSIS — G35 Multiple sclerosis: Secondary | ICD-10-CM

## 2021-08-01 DIAGNOSIS — G43709 Chronic migraine without aura, not intractable, without status migrainosus: Secondary | ICD-10-CM

## 2021-08-01 DIAGNOSIS — E1165 Type 2 diabetes mellitus with hyperglycemia: Secondary | ICD-10-CM | POA: Insufficient documentation

## 2021-08-01 DIAGNOSIS — E1143 Type 2 diabetes mellitus with diabetic autonomic (poly)neuropathy: Secondary | ICD-10-CM | POA: Diagnosis not present

## 2021-08-01 DIAGNOSIS — R5383 Other fatigue: Secondary | ICD-10-CM

## 2021-08-01 MED ORDER — VENLAFAXINE HCL ER 75 MG PO CP24: 75.0000 mg | ORAL_CAPSULE | Freq: Every day | ORAL | 11 refills | Status: DC

## 2021-08-01 MED ORDER — NORTRIPTYLINE HCL 25 MG PO CAPS: 50.0000 mg | ORAL_CAPSULE | Freq: Every day | ORAL | 4 refills | Status: DC

## 2021-08-01 MED ORDER — TOPIRAMATE 100 MG PO TABS: 100.0000 mg | ORAL_TABLET | Freq: Every day | ORAL | 4 refills | Status: DC

## 2021-08-01 MED ORDER — RIZATRIPTAN BENZOATE 10 MG PO TABS: 10.0000 mg | ORAL_TABLET | ORAL | 3 refills | Status: DC | PRN

## 2021-08-01 MED ORDER — DULOXETINE HCL 60 MG PO CPEP: 60.0000 mg | ORAL_CAPSULE | Freq: Every day | ORAL | 3 refills | Status: DC

## 2021-08-01 NOTE — Progress Notes (Signed)
ASSESSMENT AND PLAN 50 y.o. year old female  1.  Relapsing remitting multiple sclerosis -Ocrevus since February 2020, overall stable -MRI of the brain with and without contrast in February 2021 showed no significant change compared to previous in April 2019  2.  Vitamin D deficiency  Continue supplement  3.  Diabetes, poorly controlled -Poorly controlled again, recent A1c 12.4 in December 2021, emphasized importance of compliant with her medications  4.  Depression  Reported suboptimal control, on nortriptyline 50 mg every night, no longer taking Effexor, will add on Cymbalta 60 mg daily  5.  Migraine headaches  Topamax 100 mg at bedtime plus nortriptyline as preventive medication, Maxalt 10 mg as needed works well .  6:  OSA  On CPAP machine  HISTORY OF PRESENT ILLNESS:  Katrina Montes is 61 right-handed African-American female, accompanied by her mother, referred by her primary care physician Dr. Jeanice Lim for evaluation of right optic neuritis, abnormal MRI of the brain, suspicious for relapsing remitting multiple sclerosis, initial evaluation was on Jan 14 2015.   She had a past medical history of hypertension, diabetes, A1c was 11.9 in July 2015, most recent A1c was still high at 8.9, used to work as a Counselling psychologist job, which was out sourced recently,   In May of 2015, she had transient right eye blurry vision, was considered due to her high glucose, recovered within a few weeks   Since January 2016, she began to experience intermittent vertigo, dizziness, also had worsening right eye blurry vision, color washing out, could only see her rim at her right peripheral visual field.   Relapsing Remitting Multiple Sclerosis was diagnosed since January 2016: Based on her abnormal MRI of the brain, there was also cervical spinal cord involvement   MRI of the brain without contrast at [facility] imaging January 2016 showed multiple periventricular white matter disease, significant  involvement of corpus callosum, perpendicular to ventricle, consistent with multiple sclerosis, now was also suspicious for right optic nerve edema, with hyperintensity signal at T2, FLAIR, no contrast enhancement, MRA of brain was normal.   MRI of the cervical spine with and without contrast in Jan 2016: showing hyperintense foci within the spinal cord posteriorly at C4 and posteriolaterally at C7-T1 consistent with multiple sclerosis plaques. There were no enhancing foci.   Spinal fluid testing January 13 2015, total protein was 708, RBC was 1600, RBC was 7,, cloudy reddish-looking, elevated IgG 17.7, IgG index 0.97, IgG synthetic rate 238, 0 oligoclonal bands,   Visual evoked potential showed prolongation on the right side.   She was treated with IV Solu-Medrol for 3 days in Jan 2016, which only mild improved her right vision, has significant worsening of her blood glucose level.She was treated with Achtar injection in Feb 2016, but still having significant residual visual difficulty.   JC virus was positive with titer of 1.1 3 (Jan 14 2015),  laboratory evaluation showed normal or negative CPK, HIV, protein electrophoresis, positive varicella-zoster virus antibody, mild elevated ESR, C-reactive protein, hepatitis panel, Lyme titer, showed anemia, hemoglobin 11.8, otherwise normal CBC, normal CMP, with exception of elevated glucose, normal TSH,   She was treated with Tysarbril since March 14th, 2016, was switched to ocrelizumab since February 2020   MRI of the brain with and without contrast in April 2019 showed multiple supratentorium lesions, no contrast enhancement, no significant change compared to previous MRI of the brain   Vision: History of right optic neuritis, with partial recovery, now complains of  blurry vision, difficult to read anything without bleeders or magnified glasses, difficult to read through computer screen or watch TV, visual acuity, OS 20/40, OD 20/50-1   Cognitive  impairment: has to take frequent notes, difficulty to concentrate, to express herself.   Gait abnormality: Treated easily, generalized weakness, also complicated by her low back pain, left sided radicular pain,   Spasticity: Bilateral lower extremity spasticity, is taking gabapentin 300 mg 3 times a day previously tried and failed baclofen   Fatigue: Severe fatigue, also complicated by her depression anxiety chronic insomnia, body achy pain, limit her daily activity more than 50%, she has heavy menstruation-like cycle, chronic anemia, recent hemoglobin was 10.6.   Chronic migraine: She also has history of chronic migraine headaches, retro-orbital area severe pounding headache with associated light noise sensitivity, and happened frequently, up to 3 to four times each week, Maxalt as needed was helpful,   Severe low back pain, radiating pain to left lower extremity,  MRI of lumbar in July 201 there is minimum anterolisthesis of L4 upon L5, due to severe facet hypertrophy, associated with left ligamentum flavum hypertrophy and disc protrusion, there is moderate  Left foraminal and lateral recess stenosis, potential for left L4-5 nerve root compression.   Diabetic peripheral neuropathy, Progressive worsening bilateral feet paresthesia, now taking insulin   Depression anxiety, chronic insomnia, polypharmacy treatment:  Trazodone 50 mg every night, gabapentin 300 mg 3 times a day, Topamax 100 mg twice a day, Flexeril as needed   Weakness in hands: EMG nerve conduction study from outside clinic confirmed the diagnosis of severe right median neuropathy,   Urinary frequency: stable Hearing Loss-she reports occasional ringing in her ears, notices her hearing is not as good as it used to be, having to read lips, this is embarrassing to her   Mild obstructive sleep apnea, using CPAP machine,   Vitamin D deficiency: Vitamin D level was 23, on supplement    Update 15 2022 She is tolerating  ocrelizumab, today alone at visit, constellation of complaints, has been out of her insulin due to financial concerns, last A1c December 2021 was 12.3, continue complains of fatigue, depression, gait abnormality, frequent migraine headaches, mild improvement in her low back pain, there is no flareup of her MS symptoms, urinary urgency, mild gait abnormalities  We personally reviewed most recent MRI of the brain with without contrast in February 2021: Multiple T2/FLAIR hyperintensity in the hemisphere, left thalamus, brainstem, none of this is acute.  No contrast-enhancement      REVIEW OF SYSTEMS: Out of a complete 14 system review of symptoms, the patient complains only of the following symptoms, and all other reviewed systems are negative.  Back pain gait abnormality, numbness, depression, headache  ALLERGIES: No Known Allergies  HOME MEDICATIONS: Outpatient Medications Prior to Visit  Medication Sig Dispense Refill   APPLE CIDER VINEGAR PO Take 1 tablet by mouth as needed.      Blood Glucose Monitoring Suppl (BLOOD GLUCOSE SYSTEM PAK) KIT Please dispense as Guide Me System. Use as directed to monitor FSBS 3x daily. Dx: E11.9. 100 each 3   cyclobenzaprine (FLEXERIL) 10 MG tablet TAKE 1 Tablet BY MOUTH 4 TIMES DAILY AS NEEDED  3   DiphenhydrAMINE HCl (BENADRYL ALLERGY PO) Take by mouth as needed.     docusate sodium (COLACE) 50 MG capsule Take 1 capsule (50 mg total) by mouth 2 (two) times daily. 60 capsule 11   ELDERBERRY PO Take 1,150 mg by mouth daily.  ferrous sulfate (EQL SLOW RELEASE IRON) 160 (50 Fe) MG TBCR SR tablet Take 1 tablet (160 mg total) by mouth daily. 30 tablet 3   Glucose Blood (BLOOD GLUCOSE TEST STRIPS) STRP Please dispense as Guide Me System. Use as directed to monitor FSBS 3x daily. Dx: E11.9. 100 each 3   HYDROcodone-acetaminophen (NORCO) 7.5-325 MG tablet Take 1 tablet by mouth every 6 (six) hours as needed for moderate pain. 45 tablet 0   ibuprofen (ADVIL,MOTRIN)  200 MG tablet Take 200 mg by mouth every 6 (six) hours as needed.     JANUMET 50-500 MG tablet Take 1 tablet by mouth 2 (two) times daily. 180 tablet 0   Lancets MISC Please dispense as Guide Me System. Use as directed to monitor FSBS 3x daily. Dx: E11.9. 100 each 3   lisinopril-hydrochlorothiazide (ZESTORETIC) 20-12.5 MG tablet TAKE 1 TABLET BY MOUTH DAILY 90 tablet 1   meclizine (ANTIVERT) 25 MG tablet Take 1 tablet (25 mg total) by mouth 3 (three) times daily as needed for dizziness. 30 tablet 6   Multiple Vitamin (MULTIVITAMIN) capsule Take 1 capsule by mouth daily.     norgestimate-ethinyl estradiol (ESTARYLLA) 0.25-35 MG-MCG tablet Take 1 tablet by mouth daily. 84 tablet 3   ocrelizumab (OCREVUS) 300 MG/10ML injection Inject into the vein every 6 (six) months.     rizatriptan (MAXALT) 10 MG tablet Take 1 tablet (10 mg total) by mouth as needed for migraine. May repeat in 2 hours if needed. Max dose: 2/24 hr or 15/30 days. 45 tablet 3   topiramate (TOPAMAX) 100 MG tablet TAKE 1 TABLET(100 MG) BY MOUTH TWICE DAILY 180 tablet 3   venlafaxine XR (EFFEXOR XR) 75 MG 24 hr capsule Take 1 capsule (75 mg total) by mouth daily with breakfast. 30 capsule 11   VITAMIN D PO Take 5,000 Units by mouth daily.     Insulin Glargine (BASAGLAR KWIKPEN) 100 UNIT/ML ADMINISTER 25 TO 35 UNITS UNDER THE SKIN EVERY NIGHT 15 mL 3   gabapentin (NEURONTIN) 300 MG capsule Take $RemoveBefo'300mg'gsPDluWYtSl$  BID and $Remov'600mg'RImfGm$  at bedtime 120 capsule 11   Vitamin D, Ergocalciferol, (DRISDOL) 1.25 MG (50000 UNIT) CAPS capsule Take 1 capsule (50,000 Units total) by mouth every 7 (seven) days. 26 capsule 0   No facility-administered medications prior to visit.    PAST MEDICAL HISTORY: Past Medical History:  Diagnosis Date   Anemia    Anxiety    Back pain    Carpal tunnel syndrome    Constipation    Depression    Diabetes mellitus    Hypertension    Joint pain    Lactose intolerance    Multiple sclerosis (HCC)    Optic neuritis    Shortness  of breath    Swallowing difficulty    Swelling of both lower extremities    Vision, loss, sudden    Vitamin D deficiency     PAST SURGICAL HISTORY: Past Surgical History:  Procedure Laterality Date   BREAST REDUCTION SURGERY     CYST EXCISION     ovarian cyst removed     TUBAL LIGATION     WISDOM TOOTH EXTRACTION      FAMILY HISTORY: Family History  Problem Relation Age of Onset   Cancer Mother    Diabetes Mother    Hearing loss Mother    Thyroid nodules Mother    Anemia Mother    Kidney disease Mother    Hypertension Mother    Obesity Mother    Hyperlipidemia  Father    High blood pressure Father    Sleep apnea Father    Anemia Sister    Arthritis Maternal Grandmother    Arthritis Maternal Grandfather    Arthritis Paternal Grandmother    Kidney disease Paternal Grandmother    Hypertension Paternal Grandmother    Arthritis Paternal Grandfather    Vision loss Paternal Grandfather    Stroke Paternal Grandfather    Hyperlipidemia Paternal Grandfather    Anemia Sister     SOCIAL HISTORY: Social History   Socioeconomic History   Marital status: Single    Spouse name: Not on file   Number of children: 0   Years of education: College   Highest education level: Not on file  Occupational History   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   Right handed.   Uses caffeine sparingly.   Social Determinants of Health   Financial Resource Strain: Not on file  Food Insecurity: Not on file  Transportation Needs: Not on file  Physical Activity: Not on file  Stress: Not on file  Social Connections: Not on file  Intimate Partner Violence: Not on file   PHYSICAL EXAM  Vitals:   08/01/21  0741  BP: (!) 144/89  Pulse: 85  Weight: 279 lb (126.6 kg)  Height: 5' 6.5" (1.689 m)   Body mass index is 44.36 kg/m.  Generalized: Well developed, in no acute distress  Neurological examination  Mentation: Alert oriented to time, place, history taking. Follows all commands speech and language fluent Cranial nerve II-XII: Pupils were equal round reactive to light. Extraocular movements were full, visual field were full on confrontational test. Facial sensation and strength were normal. Head turning and shoulder shrug were normal and symmetric. Equal moderate grip strengths bilaterally  Motor: 4/5 left hip flexion, left knee extension Sensory: Sensory testing is intact to soft touch on all 4 extremities. No evidence of extinction is noted.  Coordination: Cerebellar testing reveals good finger-nose-finger and heel-to-shin bilaterally.  Gait and station: Has to push off from seated position to stand, gait is slightly wide-based, antalgic on the left Reflexes: Deep tendon reflexes are symmetric 1+ bilaterally  DIAGNOSTIC DATA (LABS, IMAGING, TESTING) - I reviewed patient records, labs, notes, testing and imaging myself where available.  Lab Results  Component Value Date   WBC 6.6 11/19/2020   HGB 11.9 11/19/2020   HCT 37.1 11/19/2020   MCV 83.4 11/19/2020   PLT 270 11/19/2020      Component Value Date/Time   NA 135 11/19/2020 0931   NA 137 07/19/2020 0813   K 4.4 11/19/2020 0931   CL 100 11/19/2020 0931   CO2 24 11/19/2020 0931   GLUCOSE 277 (H) 11/19/2020 0931   BUN 8 11/19/2020 0931   BUN 12 07/19/2020 0813   CREATININE 0.67 11/19/2020 0931   CALCIUM 9.1 11/19/2020 0931   PROT 6.9 11/19/2020 0931   PROT 7.2 07/19/2020 0813   ALBUMIN 3.8 07/19/2020 0813   AST 25 11/19/2020 0931   ALT 16 11/19/2020 0931   ALKPHOS 70 07/19/2020 0813   BILITOT 0.4 11/19/2020 0931   BILITOT 0.4 07/19/2020 0813   GFRNONAA 104 07/19/2020 0813  GFRAA 120 07/19/2020 0813   Lab Results   Component Value Date   CHOL 182 11/19/2020   HDL 74 11/19/2020   LDLCALC 87 11/19/2020   TRIG 116 11/19/2020   CHOLHDL 2.5 11/19/2020   Lab Results  Component Value Date   HGBA1C 12.3 (H) 11/19/2020   No results found for: [id number] Lab Results  Component Value Date   TSH 1.050 10/21/2018

## 2021-10-23 ENCOUNTER — Encounter: Payer: Self-pay | Admitting: Neurology

## 2021-10-24 ENCOUNTER — Other Ambulatory Visit: Payer: Self-pay | Admitting: Neurology

## 2021-10-24 ENCOUNTER — Other Ambulatory Visit: Payer: Self-pay | Admitting: Family Medicine

## 2021-10-24 DIAGNOSIS — Z1231 Encounter for screening mammogram for malignant neoplasm of breast: Secondary | ICD-10-CM

## 2021-10-24 NOTE — Telephone Encounter (Signed)
Received a message from [name] w/ [facility] stating pt has been scheduled for 11/29. She reported she has tested positive for COVID.

## 2021-10-24 NOTE — Telephone Encounter (Signed)
This process for PAP new start was started in July. I have reached out to [facility] with a high priority message seeking an update for the pt.

## 2021-10-25 ENCOUNTER — Telehealth: Payer: Self-pay | Admitting: Neurology

## 2021-10-25 NOTE — Telephone Encounter (Signed)
MS patient called about being Covid test positive - was tested Wednesday 11.02.22, last week , Wednesday's test by [facility]-returned on Friday- 10-21-2021, positive, but patient is symptom free. 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 (Was tested because of someone close to her being positive, not because any health status changes in her ).   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  She asked if she can resume activity after 5 days post positive test and I confirmed, yes, that is the current CDC and health department guideline.  She does not  need to test negative - she has to mask for 5 days post positive test, and is allowed to resume childcare activities on day 6 . It would be fine to restart tomorrow, I recommended to mask until end of this week.  Patient appreciated this confirmation.  Melvyn Novas, MD

## 2021-10-27 NOTE — Telephone Encounter (Signed)
Phone note in chart. Dr. Vickey Huger spoke to this patient on 10/25/21.

## 2021-11-19 NOTE — Progress Notes (Signed)
Chart reviewed, agree above plan ?

## 2021-11-28 DIAGNOSIS — Z1231 Encounter for screening mammogram for malignant neoplasm of breast: Secondary | ICD-10-CM

## 2021-12-08 ENCOUNTER — Encounter: Payer: Self-pay | Admitting: *Deleted

## 2021-12-13 ENCOUNTER — Encounter: Payer: Self-pay | Admitting: *Deleted

## 2021-12-22 ENCOUNTER — Inpatient Hospital Stay (HOSPITAL_BASED_OUTPATIENT_CLINIC_OR_DEPARTMENT_OTHER)
Admission: EM | Admit: 2021-12-22 | Discharge: 2021-12-30 | DRG: 570 | Disposition: A | Discharge status: Home Health | Payer: Medicare Other | Attending: Internal Medicine | Admitting: Internal Medicine

## 2021-12-22 ENCOUNTER — Emergency Department (HOSPITAL_BASED_OUTPATIENT_CLINIC_OR_DEPARTMENT_OTHER): Payer: Medicare Other

## 2021-12-22 ENCOUNTER — Other Ambulatory Visit: Payer: Self-pay

## 2021-12-22 ENCOUNTER — Encounter (HOSPITAL_BASED_OUTPATIENT_CLINIC_OR_DEPARTMENT_OTHER): Payer: Self-pay | Admitting: Emergency Medicine

## 2021-12-22 DIAGNOSIS — E1100 Type 2 diabetes mellitus with hyperosmolarity without nonketotic hyperglycemic-hyperosmolar coma (NKHHC): Secondary | ICD-10-CM | POA: Diagnosis present

## 2021-12-22 DIAGNOSIS — F32A Depression, unspecified: Secondary | ICD-10-CM | POA: Diagnosis present

## 2021-12-22 DIAGNOSIS — Z8261 Family history of arthritis: Secondary | ICD-10-CM

## 2021-12-22 DIAGNOSIS — L0231 Cutaneous abscess of buttock: Principal | ICD-10-CM | POA: Diagnosis present

## 2021-12-22 DIAGNOSIS — E876 Hypokalemia: Secondary | ICD-10-CM | POA: Diagnosis present

## 2021-12-22 DIAGNOSIS — Z8249 Family history of ischemic heart disease and other diseases of the circulatory system: Secondary | ICD-10-CM

## 2021-12-22 DIAGNOSIS — R739 Hyperglycemia, unspecified: Secondary | ICD-10-CM | POA: Diagnosis not present

## 2021-12-22 DIAGNOSIS — Z833 Family history of diabetes mellitus: Secondary | ICD-10-CM

## 2021-12-22 DIAGNOSIS — I878 Other specified disorders of veins: Secondary | ICD-10-CM

## 2021-12-22 DIAGNOSIS — Z841 Family history of disorders of kidney and ureter: Secondary | ICD-10-CM

## 2021-12-22 DIAGNOSIS — E1169 Type 2 diabetes mellitus with other specified complication: Secondary | ICD-10-CM

## 2021-12-22 DIAGNOSIS — E119 Type 2 diabetes mellitus without complications: Secondary | ICD-10-CM

## 2021-12-22 DIAGNOSIS — T383X6A Underdosing of insulin and oral hypoglycemic [antidiabetic] drugs, initial encounter: Secondary | ICD-10-CM | POA: Diagnosis present

## 2021-12-22 DIAGNOSIS — R9431 Abnormal electrocardiogram [ECG] [EKG]: Secondary | ICD-10-CM | POA: Diagnosis present

## 2021-12-22 DIAGNOSIS — N3080 Other cystitis without hematuria: Secondary | ICD-10-CM | POA: Diagnosis present

## 2021-12-22 DIAGNOSIS — L03317 Cellulitis of buttock: Secondary | ICD-10-CM | POA: Diagnosis present

## 2021-12-22 DIAGNOSIS — E1140 Type 2 diabetes mellitus with diabetic neuropathy, unspecified: Secondary | ICD-10-CM | POA: Diagnosis present

## 2021-12-22 DIAGNOSIS — B3731 Acute candidiasis of vulva and vagina: Secondary | ICD-10-CM | POA: Diagnosis present

## 2021-12-22 DIAGNOSIS — B9562 Methicillin resistant Staphylococcus aureus infection as the cause of diseases classified elsewhere: Secondary | ICD-10-CM | POA: Diagnosis present

## 2021-12-22 DIAGNOSIS — Z7984 Long term (current) use of oral hypoglycemic drugs: Secondary | ICD-10-CM

## 2021-12-22 DIAGNOSIS — A419 Sepsis, unspecified organism: Secondary | ICD-10-CM

## 2021-12-22 DIAGNOSIS — B372 Candidiasis of skin and nail: Secondary | ICD-10-CM | POA: Diagnosis present

## 2021-12-22 DIAGNOSIS — G4733 Obstructive sleep apnea (adult) (pediatric): Secondary | ICD-10-CM | POA: Diagnosis present

## 2021-12-22 DIAGNOSIS — E1165 Type 2 diabetes mellitus with hyperglycemia: Secondary | ICD-10-CM | POA: Diagnosis present

## 2021-12-22 DIAGNOSIS — Z79899 Other long term (current) drug therapy: Secondary | ICD-10-CM

## 2021-12-22 DIAGNOSIS — G43909 Migraine, unspecified, not intractable, without status migrainosus: Secondary | ICD-10-CM | POA: Diagnosis present

## 2021-12-22 DIAGNOSIS — Z83438 Family history of other disorder of lipoprotein metabolism and other lipidemia: Secondary | ICD-10-CM

## 2021-12-22 DIAGNOSIS — I10 Essential (primary) hypertension: Secondary | ICD-10-CM | POA: Diagnosis present

## 2021-12-22 DIAGNOSIS — Z91120 Patient's intentional underdosing of medication regimen due to financial hardship: Secondary | ICD-10-CM

## 2021-12-22 DIAGNOSIS — Z6841 Body Mass Index (BMI) 40.0 and over, adult: Secondary | ICD-10-CM

## 2021-12-22 DIAGNOSIS — G35 Multiple sclerosis: Secondary | ICD-10-CM | POA: Diagnosis present

## 2021-12-22 DIAGNOSIS — Z20822 Contact with and (suspected) exposure to covid-19: Secondary | ICD-10-CM | POA: Diagnosis present

## 2021-12-22 DIAGNOSIS — Z794 Long term (current) use of insulin: Secondary | ICD-10-CM

## 2021-12-22 DIAGNOSIS — G35D Multiple sclerosis, unspecified: Secondary | ICD-10-CM | POA: Diagnosis present

## 2021-12-22 DIAGNOSIS — Z823 Family history of stroke: Secondary | ICD-10-CM

## 2021-12-22 DIAGNOSIS — D509 Iron deficiency anemia, unspecified: Secondary | ICD-10-CM | POA: Diagnosis present

## 2021-12-22 LAB — LACTIC ACID, PLASMA: Lactic Acid, Venous: 1.9 mmol/L (ref 0.5–1.9)

## 2021-12-22 LAB — COMPREHENSIVE METABOLIC PANEL
ALT: 10 U/L (ref 0–44)
AST: 10 U/L — ABNORMAL LOW (ref 15–41)
Albumin: 3.7 g/dL (ref 3.5–5.0)
Alkaline Phosphatase: 157 U/L — ABNORMAL HIGH (ref 38–126)
Anion gap: 15 (ref 5–15)
BUN: 15 mg/dL (ref 6–20)
CO2: 25 mmol/L (ref 22–32)
Calcium: 9.5 mg/dL (ref 8.9–10.3)
Chloride: 91 mmol/L — ABNORMAL LOW (ref 98–111)
Creatinine, Ser: 1.06 mg/dL — ABNORMAL HIGH (ref 0.44–1.00)
GFR, Estimated: >60 mL/min (ref >60)
Glucose, Bld: 560 mg/dL (ref 70–99)
Potassium: 3.4 mmol/L — ABNORMAL LOW (ref 3.5–5.1)
Sodium: 131 mmol/L — ABNORMAL LOW (ref 135–145)
Total Bilirubin: 0.8 mg/dL (ref 0.3–1.2)
Total Protein: 8.4 g/dL — ABNORMAL HIGH (ref 6.5–8.1)

## 2021-12-22 LAB — CBG MONITORING, ED
Glucose-Capillary: 533 mg/dL (ref 70–99)
Glucose-Capillary: 472 mg/dL — ABNORMAL HIGH (ref 70–99)
Glucose-Capillary: 389 mg/dL — ABNORMAL HIGH (ref 70–99)
Glucose-Capillary: 291 mg/dL — ABNORMAL HIGH (ref 70–99)
Glucose-Capillary: 181 mg/dL — ABNORMAL HIGH (ref 70–99)

## 2021-12-22 LAB — CBC WITH DIFFERENTIAL/PLATELET
Abs Immature Granulocytes: 0.56 10*3/uL — ABNORMAL HIGH (ref 0.00–0.07)
Basophils Absolute: 0.1 10*3/uL (ref 0.0–0.1)
Basophils Relative: 0 %
Eosinophils Absolute: 0.1 10*3/uL (ref 0.0–0.5)
Eosinophils Relative: 0 %
HCT: 35.7 % — ABNORMAL LOW (ref 36.0–46.0)
Hemoglobin: 11.6 g/dL — ABNORMAL LOW (ref 12.0–15.0)
Immature Granulocytes: 2 %
Lymphocytes Relative: 3 %
Lymphs Abs: 0.8 10*3/uL (ref 0.7–4.0)
MCH: 26.5 pg (ref 26.0–34.0)
MCHC: 32.5 g/dL (ref 30.0–36.0)
MCV: 81.5 fL (ref 80.0–100.0)
Monocytes Absolute: 2 10*3/uL — ABNORMAL HIGH (ref 0.1–1.0)
Monocytes Relative: 7 %
Neutro Abs: 23.7 10*3/uL — ABNORMAL HIGH (ref 1.7–7.7)
Neutrophils Relative %: 88 %
Platelets: 444 10*3/uL — ABNORMAL HIGH (ref 150–400)
RBC: 4.38 MIL/uL (ref 3.87–5.11)
RDW: 14 % (ref 11.5–15.5)
WBC: 27.2 10*3/uL — ABNORMAL HIGH (ref 4.0–10.5)
nRBC: 0 % (ref 0.0–0.2)

## 2021-12-22 LAB — RESP PANEL BY RT-PCR (FLU A&B, COVID) ARPGX2
Influenza A by PCR: NEGATIVE
Influenza B by PCR: NEGATIVE
SARS Coronavirus 2 by RT PCR: NEGATIVE

## 2021-12-22 LAB — PROTIME-INR
INR: 1.1 (ref 0.8–1.2)
Prothrombin Time: 14.5 seconds (ref 11.4–15.2)

## 2021-12-22 LAB — APTT: aPTT: 29 seconds (ref 24–36)

## 2021-12-22 MED ORDER — MORPHINE SULFATE (PF) 4 MG/ML IV SOLN
4.0000 mg | Freq: Once | INTRAVENOUS | Status: AC
  Administered 2021-12-22: 4 mg via INTRAVENOUS
  Filled 2021-12-22: qty 1

## 2021-12-22 MED ORDER — SODIUM CHLORIDE 0.9 % IV SOLN: 2500.0000 mg | Freq: Once | INTRAVENOUS | Status: DC

## 2021-12-22 MED ORDER — VANCOMYCIN HCL 500 MG/100ML IV SOLN
500.0000 mg | Freq: Once | INTRAVENOUS | Status: DC
  Filled 2021-12-22: qty 100

## 2021-12-22 MED ORDER — VANCOMYCIN HCL IN DEXTROSE 1-5 GM/200ML-% IV SOLN: 1000.0000 mg | Freq: Once | INTRAVENOUS | Status: DC

## 2021-12-22 MED ORDER — DEXTROSE 50 % IV SOLN: 0.0000 mL | INTRAVENOUS | Status: DC | PRN

## 2021-12-22 MED ORDER — HYDROMORPHONE HCL 1 MG/ML IJ SOLN
1.0000 mg | Freq: Once | INTRAMUSCULAR | Status: AC
  Administered 2021-12-23: 1 mg via INTRAVENOUS
  Filled 2021-12-22: qty 1

## 2021-12-22 MED ORDER — METRONIDAZOLE 500 MG/100ML IV SOLN
500.0000 mg | Freq: Once | INTRAVENOUS | Status: AC
  Administered 2021-12-22: 500 mg via INTRAVENOUS
  Filled 2021-12-22: qty 100

## 2021-12-22 MED ORDER — LACTATED RINGERS IV SOLN: INTRAVENOUS | Status: AC

## 2021-12-22 MED ORDER — VANCOMYCIN HCL 1750 MG/350ML IV SOLN
1750.0000 mg | INTRAVENOUS | Status: DC
  Filled 2021-12-22: qty 350

## 2021-12-22 MED ORDER — VANCOMYCIN HCL 500 MG IV SOLR
INTRAVENOUS | Status: AC
  Filled 2021-12-22: qty 10

## 2021-12-22 MED ORDER — VANCOMYCIN HCL IN DEXTROSE 1-5 GM/200ML-% IV SOLN
1000.0000 mg | Freq: Once | INTRAVENOUS | Status: AC
  Administered 2021-12-22: 1000 mg via INTRAVENOUS
  Filled 2021-12-22: qty 200

## 2021-12-22 MED ORDER — SODIUM CHLORIDE 0.9 % IV SOLN
2.0000 g | Freq: Once | INTRAVENOUS | Status: AC
  Administered 2021-12-22: 2 g via INTRAVENOUS
  Filled 2021-12-22: qty 2

## 2021-12-22 MED ORDER — CEFEPIME HCL 2 G IJ SOLR
2.0000 g | Freq: Three times a day (TID) | INTRAMUSCULAR | Status: DC
  Administered 2021-12-23 – 2021-12-27 (×12): 2 g via INTRAVENOUS
  Filled 2021-12-22 (×14): qty 2

## 2021-12-22 MED ORDER — LACTATED RINGERS IV BOLUS (SEPSIS)
800.0000 mL | Freq: Once | INTRAVENOUS | Status: AC
  Administered 2021-12-22: 800 mL via INTRAVENOUS

## 2021-12-22 MED ORDER — VANCOMYCIN HCL 10 G IV SOLR
500.0000 mg | Freq: Once | INTRAVENOUS | Status: AC
  Administered 2021-12-22: 500 mg via INTRAVENOUS
  Filled 2021-12-22: qty 5

## 2021-12-22 MED ORDER — HYDROMORPHONE HCL 1 MG/ML IJ SOLN
1.0000 mg | Freq: Once | INTRAMUSCULAR | Status: AC
  Administered 2021-12-22: 1 mg via INTRAVENOUS
  Filled 2021-12-22: qty 1

## 2021-12-22 MED ORDER — LACTATED RINGERS IV BOLUS (SEPSIS)
1000.0000 mL | Freq: Once | INTRAVENOUS | Status: AC
  Administered 2021-12-22: 1000 mL via INTRAVENOUS

## 2021-12-22 MED ORDER — IOHEXOL 300 MG/ML  SOLN
100.0000 mL | Freq: Once | INTRAMUSCULAR | Status: AC | PRN
  Administered 2021-12-22: 100 mL via INTRAVENOUS

## 2021-12-22 MED ORDER — DEXTROSE IN LACTATED RINGERS 5 % IV SOLN: INTRAVENOUS | Status: DC

## 2021-12-22 MED ORDER — VANCOMYCIN HCL 500 MG/100ML IV SOLN
500.0000 mg | Freq: Once | INTRAVENOUS | Status: DC
Start: 2021-12-22 — End: 2021-12-22
  Filled 2021-12-22: qty 100

## 2021-12-22 MED ORDER — INSULIN REGULAR(HUMAN) IN NACL 100-0.9 UT/100ML-% IV SOLN
INTRAVENOUS | Status: DC
  Administered 2021-12-22: 15 [IU]/h via INTRAVENOUS
  Filled 2021-12-22 (×2): qty 100

## 2021-12-22 NOTE — ED Provider Notes (Signed)
[facility] EMERGENCY DEPT Provider Note   CSN: [id number] Arrival date & time: 12/22/21  1705     History  Chief Complaint  Patient presents with   Abscess    Katrina Montes is a 51 y.o. female.  The history is provided by the patient and medical records. No language interpreter was used.  Abscess  52 year old female significant history of diabetes presenting with concerns of left buttock abscess.  Home Medications Prior to Admission medications   Medication Sig Start Date End Date Taking? Authorizing Provider  APPLE CIDER VINEGAR PO Take 1 tablet by mouth as needed.     [provider]  Blood Glucose Monitoring Suppl (BLOOD GLUCOSE SYSTEM PAK) KIT Please dispense as Guide Me System. Use as directed to monitor FSBS 3x daily. Dx: E11.9. 05/07/19   Alycia Rossetti, MD  cyclobenzaprine (FLEXERIL) 10 MG tablet TAKE 1 Tablet BY MOUTH 4 TIMES DAILY AS NEEDED 10/30/16   [provider]  DiphenhydrAMINE HCl (BENADRYL ALLERGY PO) Take by mouth as needed.    [provider]  docusate sodium (COLACE) 50 MG capsule Take 1 capsule (50 mg total) by mouth 2 (two) times daily. 03/09/15   Marcial Pacas, MD  DULoxetine (CYMBALTA) 60 MG capsule Take 1 capsule (60 mg total) by mouth daily. 08/01/21   Marcial Pacas, MD  ELDERBERRY PO Take 1,150 mg by mouth daily.    [provider]  ferrous sulfate (EQL SLOW RELEASE IRON) 160 (50 Fe) MG TBCR SR tablet Take 1 tablet (160 mg total) by mouth daily. 11/26/20   Alycia Rossetti, MD  Glucose Blood (BLOOD GLUCOSE TEST STRIPS) STRP Please dispense as Guide Me System. Use as directed to monitor FSBS 3x daily. Dx: E11.9. 05/07/19   Alycia Rossetti, MD  HYDROcodone-acetaminophen (NORCO) 7.5-325 MG tablet Take 1 tablet by mouth every 6 (six) hours as needed for moderate pain. 01/05/20   [place], Modena Nunnery, MD  ibuprofen (ADVIL,MOTRIN) 200 MG tablet Take 200 mg by mouth every 6 (six) hours as needed.    [provider]  JANUMET 50-500 MG tablet Take 1 tablet by mouth 2 (two) times daily. 11/01/20   Alycia Rossetti, MD  Lancets MISC Please dispense as Guide Me System. Use as directed to monitor FSBS 3x daily. Dx: E11.9. 05/07/19   Alycia Rossetti, MD  lisinopril-hydrochlorothiazide (ZESTORETIC) 20-12.5 MG tablet TAKE 1 TABLET BY MOUTH DAILY 12/15/19   [place], Modena Nunnery, MD  meclizine (ANTIVERT) 25 MG tablet Take 1 tablet (25 mg total) by mouth 3 (three) times daily as needed for dizziness. 07/19/20   Suzzanne Cloud, NP  Multiple Vitamin (MULTIVITAMIN) capsule Take 1 capsule by mouth daily.    [provider]  norgestimate-ethinyl estradiol (ESTARYLLA) 0.25-35 MG-MCG tablet Take 1 tablet by mouth daily. 08/12/20   Alycia Rossetti, MD  nortriptyline (PAMELOR) 25 MG capsule Take 2 capsules (50 mg total) by mouth at bedtime. 08/01/21   Marcial Pacas, MD  ocrelizumab (OCREVUS) 300 MG/10ML injection Inject into the vein every 6 (six) months.    [provider]  rizatriptan (MAXALT) 10 MG tablet Take 1 tablet (10 mg total) by mouth as needed for migraine. May repeat in 2 hours if needed. Max dose: 2/24 hr or 15/30 days. 08/01/21   Marcial Pacas, MD  topiramate (TOPAMAX) 100 MG tablet Take 1 tablet (100 mg total) by mouth at bedtime. 08/01/21   Marcial Pacas, MD  VITAMIN D PO Take 5,000 Units by  mouth daily.    [provider]      Allergies    Patient has no known allergies.    Review of Systems   Review of Systems  All other systems reviewed and are negative.  Physical Exam Updated Vital Signs BP (!) 152/60    Temp 99.2 F (37.3 C)    Resp 18    Ht 5' 6" (1.676 m)    Wt 127 kg    BMI 45.19 kg/m  Physical Exam Vitals and nursing note reviewed.  Constitutional:      General: She is not in acute distress.    Appearance: She is well-developed. She is obese.  HENT:     Head: Atraumatic.  Eyes:     Conjunctiva/sclera: Conjunctivae normal.  Cardiovascular:     Rate and Rhythm:  Tachycardia present.     Pulses: Normal pulses.     Heart sounds: Normal heart sounds.  Pulmonary:     Effort: Pulmonary effort is normal.  Abdominal:     Palpations: Abdomen is soft.     Tenderness: There is no abdominal tenderness.  Genitourinary:    Comments: Chaperone present during exam.  There is a large firm area with fluctuant oozing out serous fluid noted to left gluteal region extending towards the perineum.  Area is tender to palpation warm to the touch.  There are macerated skin changes to the bilateral inguinal fold. Musculoskeletal:     Cervical back: Neck supple.  Skin:    General: Skin is warm.     Findings: No rash.  Neurological:     Mental Status: She is alert. Mental status is at baseline.  Psychiatric:        Mood and Affect: Mood normal.    ED Results / Procedures / Treatments   Labs (all labs ordered are listed, but only abnormal results are displayed) Labs Reviewed  COMPREHENSIVE METABOLIC PANEL - Abnormal; Notable for the following components:      Result Value   Sodium 131 (*)    Potassium 3.4 (*)    Chloride 91 (*)    Glucose, Bld 560 (*)    Creatinine, Ser 1.06 (*)    Total Protein 8.4 (*)    AST 10 (*)    Alkaline Phosphatase 157 (*)    All other components within normal limits  CBC WITH DIFFERENTIAL/PLATELET - Abnormal; Notable for the following components:   WBC 27.2 (*)    Hemoglobin 11.6 (*)    HCT 35.7 (*)    Platelets 444 (*)    Neutro Abs 23.7 (*)    Monocytes Absolute 2.0 (*)    Abs Immature Granulocytes 0.56 (*)    All other components within normal limits  CBG MONITORING, ED - Abnormal; Notable for the following components:   Glucose-Capillary 533 (*)    All other components within normal limits  CBG MONITORING, ED - Abnormal; Notable for the following components:   Glucose-Capillary 472 (*)    All other components within normal limits  RESP PANEL BY RT-PCR (FLU A&B, COVID) ARPGX2  CULTURE, BLOOD (ROUTINE X 2)  CULTURE, BLOOD  (ROUTINE X 2)  URINE CULTURE  LACTIC ACID, PLASMA  PROTIME-INR  APTT  LACTIC ACID, PLASMA  URINALYSIS, ROUTINE W REFLEX MICROSCOPIC  PREGNANCY, URINE    EKG None  Radiology CT ABDOMEN PELVIS W CONTRAST  Result Date: 12/22/2021 CLINICAL DATA:  Left lower quadrant abdominal pain. Left buttocks abscess. EXAM: CT ABDOMEN AND PELVIS WITH CONTRAST TECHNIQUE: Multidetector CT  imaging of the abdomen and pelvis was performed using the standard protocol following bolus administration of intravenous contrast. CONTRAST:  170m OMNIPAQUE IOHEXOL 300 MG/ML  SOLN COMPARISON:  None. FINDINGS: Lower chest: No acute abnormality. Hepatobiliary: There is a heterogeneously enhancing lesion in the posterior right lobe of the liver measuring 2.4 by 1.6 cm which is more homogeneous with surrounding liver on delayed imaging most compatible with hepatic hemangioma. Liver and bile ducts are within normal limits. Pancreas: Unremarkable. No pancreatic ductal dilatation or surrounding inflammatory changes. Spleen: Normal in size without focal abnormality. Adrenals/Urinary Tract: The kidneys and adrenal glands are within normal limits. There is mild bladder wall emphysema. There is a tiny amount of extraluminal gas image 2/80. Stomach/Bowel: Stomach is within normal limits. Appendix appears normal. No evidence of bowel wall thickening, distention, or inflammatory changes. There is colonic diverticulosis without evidence for acute diverticulitis. Vascular/Lymphatic: IVC and aorta are normal in size. There are enlarged left inguinal lymph nodes measuring up to 12 mm short axis. Reproductive: Uterus and bilateral adnexa are unremarkable. Other: There is no ascites or focal abdominal wall hernia. There is marked subcutaneous edema in the medial left buttocks near the gluteal cleft extending to the level of the left rectum. There are no rim enhancing fluid collections. There is no soft tissue gas in this region. Ill-defined fluid seen  near the left gluteal cleft posteriorly image 2/89 measuring 4.2 x 3.6 x 3.9 cm and posterior to the left rectum/anus image 2/97 measuring 7.4 x 2.4 by 2.6 cm. Musculoskeletal: There are degenerative changes at L4-L5. No acute fractures are seen. No osseous erosions are identified. IMPRESSION: 1. Subcutaneous fluid and stranding in the left buttock extending to the rectum. Two ill-defined fluid collections are identified in this region which may represent phlegmon or early abscess formation. No rim enhancing collections are identified. No evidence for soft tissue gas in this region. 2. Findings worrisome for emphysematous cystitis. There is also small amount of extraluminal gas near the bladder, indeterminate. Small amount of bladder perforation can not be excluded. Electronically Signed   By: ARonney AstersM.D.   On: 12/22/2021 20:13   DG Chest Port 1 View  Result Date: 12/22/2021 CLINICAL DATA:  Sepsis EXAM: PORTABLE CHEST 1 VIEW COMPARISON:  None. FINDINGS: Lungs are clear. No pneumothorax or pleural effusion. Cardiac size within normal limits. Pulmonary vascularity is normal. Osseous structures are age-appropriate. No acute bone abnormality. IMPRESSION: No active disease. Electronically Signed   By: AFidela SalisburyM.D.   On: 12/22/2021 18:52    Procedures .Critical Care Performed by: TDomenic Moras PA-C Authorized by: TDomenic Moras PA-C   Critical care provider statement:    Critical care time (minutes):  65   Critical care was necessary to treat or prevent imminent or life-threatening deterioration of the following conditions:  Sepsis   Critical care was time spent personally by me on the following activities:  Development of treatment plan with patient or surrogate, discussions with consultants, evaluation of patient's response to treatment, examination of patient, ordering and review of laboratory studies, ordering and review of radiographic studies, ordering and performing treatments and  interventions, pulse oximetry, re-evaluation of patient's condition and review of old charts    Medications Ordered in ED Medications  lactated ringers infusion (has no administration in time range)  lactated ringers bolus 1,000 mL (has no administration in time range)    And  lactated ringers bolus 800 mL (has no administration in time range)  ceFEPIme (MAXIPIME) 2 g  in sodium chloride 0.9 % 100 mL IVPB (has no administration in time range)  metroNIDAZOLE (FLAGYL) IVPB 500 mg (has no administration in time range)  insulin regular, human (MYXREDLIN) 100 units/ 100 mL infusion (has no administration in time range)  dextrose 5 % in lactated ringers infusion (has no administration in time range)  dextrose 50 % solution 0-50 mL (has no administration in time range)    ED Course/ Medical Decision Making/ A&P                           Medical Decision Making  BP (!) 152/60    Temp 99.2 F (37.3 C)    Resp 18    Ht 5' 6" (1.676 m)    Wt 127 kg    BMI 45.19 kg/m   6:28 PM This is a 51 year old female with significant pertinent past medical history including uncontrolled type 2 diabetes, anemia, hypertension, multiple sclerosis, diabetic neuropathy who presents complaining of left buttock abscess.  This is a complicated patient with multiple comorbidities that can lead to a very extensive disease process.  Patient report for about a week she has had increasing pain and swelling and drainage from her left buttock region.  Pain is sharp throbbing achy moderate to severe with purulent discharge.  She endorsed subjective fever and chills, body aches, feeling nauseous, increased urinary frequency and urgency and excessive thirst.  She admits to having history of diabetes but have not checked her blood sugar for more than a month and have also ran out of her diabetic medication for the same duration.  She tried over-the-counter medication at home without adequate relief.  On exam this is an obese  female, mentating appropriately however she appears to have a large area of induration with fluctuant noted to her left medial gluteal region oozing out purulent discharge.  There is firmness towards perineum as well as significant macerated skin changes in her inguinal fold consistent with candidiasis.  She has a benign abdominal exam.  She is tachycardic, fortunately her lungs are clear on auscultation.  She does not have any nuchal rigidity.  Initial CBG obtained showing a CBG of greater than 500.  Patient is tachycardic with a low-grade temperature.  I am concerned that patient is developing sepsis due to her left gluteal abscess.  I have also considered Fournier gangrene in this patient with poorly controlled diabetes.  Code sepsis initiated, will provide broad-spectrum antibiotic, and fluid resuscitation.  I will also obtain CT scan of the abdomen pelvis to rule out Fournier gangrene.  I will provide insulin for better control of her diabetes and will check for any evidence of DKA.  Currently her blood pressure is 152/60 and does not require vasopressor.  I have reviewed patient's prior charts and included in my decision making process.  9:07 PM Labs and imaging was reviewed and independently interpreted by me.  Blood sugar is currently at 472, will continue with managements of her hyperglycemic state.  No evidence of DKA.  Negative viral respiratory panel, normal lactic acid, labs showing sodium of 131 this is likely pseudohyponatremia in the setting of hyperglycemia.  Potassium is 3.4. Her white count is 27.2 likely reflect ongoing infection.  Blood culture has been obtained.  Chest x-ray without signs of infection.  An abdominal and pelvis CT scan obtained today demonstrate 2 ill-defined fluid collection which may represent phlegmon or early abscess formation without any rim-enhancing collection and no evidence  of soft tissue gas in this region however there is gas near the bladder suggestive of  emphysematous cystitis.  I appreciate consultation from on-call general surgeon Dr. Redmond Pulling who did review the CT scan results.  He suspect patient does have an abscess that will benefit from nonemergent or incision and drainage.  He agrees with broad-spectrum antibiotic, better management of her diabetes and likely or I&D tomorrow.  Will consult medicine for admission.  9:32 PM I have consulted [facility] hospitalist, Dr. Bridgett Larsson who agrees to admit patient to [facility] for [facility] care.  Patient should be made n.p.o. at midnight, better management of her hyperglycemic state and surgery will be involved in her care for surgical management tomorrow.  Patient is made aware of plan.  Patient currently received broad spectrum antibiotic including cefepime, Vanco, and Flagyl.        Final Clinical Impression(s) / ED Diagnoses Final diagnoses:  Abscess, gluteal, left  Hyperglycemia  Sepsis, due to unspecified organism, unspecified whether acute organ dysfunction present [facility])    Rx / DC Orders ED Discharge Orders     None         Domenic Moras, PA-C 12/22/21 2137    Fredia Sorrow, MD 12/29/21 2342

## 2021-12-22 NOTE — Sepsis Progress Note (Signed)
eLink is tracking the Code Sepsis.  

## 2021-12-22 NOTE — Progress Notes (Signed)
Pharmacy Antibiotic Note  Katrina Montes is a 51 y.o. female admitted on 12/22/2021 with  abscess in left buttock .  Pharmacy has been consulted for cefepime and vancomycin dosing.  WBC is elevated at 27.2, patient currently afebrile, HR elevated. Scr mildly elevated from baseline at 1.06.  Plan: Cefepime 2g q 8h Vancomycin IV 2.5g x 1 Vancomycin IV 1750mg  q24h (estimated AUC 507) Goal AUC 400-550 Monitor renal function and signs of clinical improvement  Height: 5\' 6"  (167.6 cm) Weight: 127 kg (280 lb) IBW/kg (Calculated) : 59.3  Temp (24hrs), Avg:99.2 F (37.3 C), Min:99.2 F (37.3 C), Max:99.2 F (37.3 C)  Recent Labs  Lab 12/22/21 1823  WBC 27.2*    CrCl cannot be calculated (Patient's most recent lab result is older than the maximum 21 days allowed.).    No Known Allergies  Antimicrobials this admission: 1/5 cefepime >>  1/5 vancomycin >>  1/5 metronidazole  Dose adjustments this admission: none  Microbiology results: 1/5 BCx: pending  Thank you for involving pharmacy in this patient's care.  Elita Quick, PharmD PGY1 Ambulatory Care Pharmacy Resident 12/22/2021 7:16 PM  **Pharmacist phone directory can be found on [email or web address] listed under [place]**

## 2021-12-22 NOTE — ED Triage Notes (Addendum)
Pt has  hand size hard abcess to left butt cheek  since before New Years, now area is leaking  profusely and she has abcess in her vagina area also has been nauseated , pt is a adiabetic and has not checked her sugars for a month

## 2021-12-22 NOTE — Progress Notes (Signed)
  [facility] will assume care on arrival to accepting facility. Until arrival, care as per EDP. However, [facility] available 24/7 for questions and assistance.   Nursing staff please page [facility] Admits and Consults ([phone]) as soon as the patient arrives to the hospital.  Ivar Domangue, DO [facility]  

## 2021-12-23 ENCOUNTER — Inpatient Hospital Stay (HOSPITAL_COMMUNITY): Payer: Medicare Other | Admitting: Anesthesiology

## 2021-12-23 ENCOUNTER — Encounter (HOSPITAL_BASED_OUTPATIENT_CLINIC_OR_DEPARTMENT_OTHER): Payer: Self-pay | Admitting: Internal Medicine

## 2021-12-23 ENCOUNTER — Encounter (HOSPITAL_COMMUNITY)
Admission: EM | Disposition: A | Discharge status: Home Health | Payer: Self-pay | Source: Home / Self Care | Attending: Internal Medicine

## 2021-12-23 DIAGNOSIS — N3080 Other cystitis without hematuria: Secondary | ICD-10-CM | POA: Diagnosis present

## 2021-12-23 DIAGNOSIS — R739 Hyperglycemia, unspecified: Secondary | ICD-10-CM | POA: Diagnosis present

## 2021-12-23 DIAGNOSIS — Z91120 Patient's intentional underdosing of medication regimen due to financial hardship: Secondary | ICD-10-CM | POA: Diagnosis not present

## 2021-12-23 DIAGNOSIS — I10 Essential (primary) hypertension: Secondary | ICD-10-CM | POA: Diagnosis present

## 2021-12-23 DIAGNOSIS — Z20822 Contact with and (suspected) exposure to covid-19: Secondary | ICD-10-CM | POA: Diagnosis present

## 2021-12-23 DIAGNOSIS — Z8261 Family history of arthritis: Secondary | ICD-10-CM | POA: Diagnosis not present

## 2021-12-23 DIAGNOSIS — E1100 Type 2 diabetes mellitus with hyperosmolarity without nonketotic hyperglycemic-hyperosmolar coma (NKHHC): Secondary | ICD-10-CM | POA: Diagnosis present

## 2021-12-23 DIAGNOSIS — G4733 Obstructive sleep apnea (adult) (pediatric): Secondary | ICD-10-CM | POA: Diagnosis present

## 2021-12-23 DIAGNOSIS — E1165 Type 2 diabetes mellitus with hyperglycemia: Secondary | ICD-10-CM | POA: Diagnosis present

## 2021-12-23 DIAGNOSIS — F32A Depression, unspecified: Secondary | ICD-10-CM | POA: Diagnosis present

## 2021-12-23 DIAGNOSIS — F3289 Other specified depressive episodes: Secondary | ICD-10-CM | POA: Diagnosis not present

## 2021-12-23 DIAGNOSIS — Z823 Family history of stroke: Secondary | ICD-10-CM | POA: Diagnosis not present

## 2021-12-23 DIAGNOSIS — B3731 Acute candidiasis of vulva and vagina: Secondary | ICD-10-CM | POA: Diagnosis present

## 2021-12-23 DIAGNOSIS — D509 Iron deficiency anemia, unspecified: Secondary | ICD-10-CM | POA: Diagnosis present

## 2021-12-23 DIAGNOSIS — R9431 Abnormal electrocardiogram [ECG] [EKG]: Secondary | ICD-10-CM | POA: Diagnosis not present

## 2021-12-23 DIAGNOSIS — L0231 Cutaneous abscess of buttock: Principal | ICD-10-CM

## 2021-12-23 DIAGNOSIS — B9562 Methicillin resistant Staphylococcus aureus infection as the cause of diseases classified elsewhere: Secondary | ICD-10-CM | POA: Diagnosis present

## 2021-12-23 DIAGNOSIS — L03317 Cellulitis of buttock: Secondary | ICD-10-CM | POA: Diagnosis present

## 2021-12-23 DIAGNOSIS — E1140 Type 2 diabetes mellitus with diabetic neuropathy, unspecified: Secondary | ICD-10-CM | POA: Diagnosis present

## 2021-12-23 DIAGNOSIS — B372 Candidiasis of skin and nail: Secondary | ICD-10-CM | POA: Diagnosis present

## 2021-12-23 DIAGNOSIS — E876 Hypokalemia: Secondary | ICD-10-CM | POA: Diagnosis present

## 2021-12-23 DIAGNOSIS — T383X6A Underdosing of insulin and oral hypoglycemic [antidiabetic] drugs, initial encounter: Secondary | ICD-10-CM | POA: Diagnosis present

## 2021-12-23 DIAGNOSIS — G35 Multiple sclerosis: Secondary | ICD-10-CM | POA: Diagnosis present

## 2021-12-23 DIAGNOSIS — Z8249 Family history of ischemic heart disease and other diseases of the circulatory system: Secondary | ICD-10-CM | POA: Diagnosis not present

## 2021-12-23 DIAGNOSIS — Z6841 Body Mass Index (BMI) 40.0 and over, adult: Secondary | ICD-10-CM | POA: Diagnosis not present

## 2021-12-23 HISTORY — PX: INCISION AND DRAINAGE ABSCESS: SHX5864

## 2021-12-23 LAB — CBG MONITORING, ED
Glucose-Capillary: 161 mg/dL — ABNORMAL HIGH (ref 70–99)
Glucose-Capillary: 162 mg/dL — ABNORMAL HIGH (ref 70–99)
Glucose-Capillary: 163 mg/dL — ABNORMAL HIGH (ref 70–99)
Glucose-Capillary: 172 mg/dL — ABNORMAL HIGH (ref 70–99)
Glucose-Capillary: 179 mg/dL — ABNORMAL HIGH (ref 70–99)
Glucose-Capillary: 187 mg/dL — ABNORMAL HIGH (ref 70–99)
Glucose-Capillary: 169 mg/dL — ABNORMAL HIGH (ref 70–99)
Glucose-Capillary: 178 mg/dL — ABNORMAL HIGH (ref 70–99)
Glucose-Capillary: 157 mg/dL — ABNORMAL HIGH (ref 70–99)
Glucose-Capillary: 147 mg/dL — ABNORMAL HIGH (ref 70–99)

## 2021-12-23 LAB — POCT I-STAT, CHEM 8
BUN: 15 mg/dL (ref 6–20)
Calcium, Ion: 0.93 mmol/L — ABNORMAL LOW (ref 1.15–1.40)
Chloride: 103 mmol/L (ref 98–111)
Creatinine, Ser: 0.3 mg/dL — ABNORMAL LOW (ref 0.44–1.00)
Glucose, Bld: 173 mg/dL — ABNORMAL HIGH (ref 70–99)
HCT: 36 % (ref 36.0–46.0)
Hemoglobin: 12.2 g/dL (ref 12.0–15.0)
Potassium: 4 mmol/L (ref 3.5–5.1)
Sodium: 135 mmol/L (ref 135–145)
TCO2: 27 mmol/L (ref 22–32)

## 2021-12-23 LAB — URINALYSIS, ROUTINE W REFLEX MICROSCOPIC
Glucose, UA: >=500 mg/dL — AB
Ketones, ur: NEGATIVE mg/dL
Nitrite: NEGATIVE
Protein, ur: 30 mg/dL — AB
Specific Gravity, Urine: 1.015 (ref 1.005–1.030)
pH: 5.5 (ref 5.0–8.0)

## 2021-12-23 LAB — BASIC METABOLIC PANEL
Anion gap: 7 (ref 5–15)
Anion gap: 8 (ref 5–15)
BUN: 13 mg/dL (ref 6–20)
BUN: 9 mg/dL (ref 6–20)
CO2: 29 mmol/L (ref 22–32)
CO2: 26 mmol/L (ref 22–32)
Calcium: 8.5 mg/dL — ABNORMAL LOW (ref 8.9–10.3)
Calcium: 8.3 mg/dL — ABNORMAL LOW (ref 8.9–10.3)
Chloride: 100 mmol/L (ref 98–111)
Chloride: 101 mmol/L (ref 98–111)
Creatinine, Ser: 0.56 mg/dL (ref 0.44–1.00)
Creatinine, Ser: 0.52 mg/dL (ref 0.44–1.00)
GFR, Estimated: >60 mL/min (ref >60)
GFR, Estimated: >60 mL/min (ref >60)
Glucose, Bld: 173 mg/dL — ABNORMAL HIGH (ref 70–99)
Glucose, Bld: 172 mg/dL — ABNORMAL HIGH (ref 70–99)
Potassium: 2.9 mmol/L — ABNORMAL LOW (ref 3.5–5.1)
Potassium: 3 mmol/L — ABNORMAL LOW (ref 3.5–5.1)
Sodium: 136 mmol/L (ref 135–145)
Sodium: 135 mmol/L (ref 135–145)

## 2021-12-23 LAB — GLUCOSE, CAPILLARY
Glucose-Capillary: 214 mg/dL — ABNORMAL HIGH (ref 70–99)
Glucose-Capillary: 204 mg/dL — ABNORMAL HIGH (ref 70–99)
Glucose-Capillary: 180 mg/dL — ABNORMAL HIGH (ref 70–99)
Glucose-Capillary: 168 mg/dL — ABNORMAL HIGH (ref 70–99)
Glucose-Capillary: 178 mg/dL — ABNORMAL HIGH (ref 70–99)
Glucose-Capillary: 173 mg/dL — ABNORMAL HIGH (ref 70–99)
Glucose-Capillary: 210 mg/dL — ABNORMAL HIGH (ref 70–99)

## 2021-12-23 LAB — HCG, SERUM, QUALITATIVE: Preg, Serum: NEGATIVE

## 2021-12-23 LAB — CBC
HCT: 29.4 % — ABNORMAL LOW (ref 36.0–46.0)
Hemoglobin: 9.6 g/dL — ABNORMAL LOW (ref 12.0–15.0)
MCH: 26.9 pg (ref 26.0–34.0)
MCHC: 32.7 g/dL (ref 30.0–36.0)
MCV: 82.4 fL (ref 80.0–100.0)
Platelets: 354 10*3/uL (ref 150–400)
RBC: 3.57 MIL/uL — ABNORMAL LOW (ref 3.87–5.11)
RDW: 14 % (ref 11.5–15.5)
WBC: 25.9 10*3/uL — ABNORMAL HIGH (ref 4.0–10.5)
nRBC: 0 % (ref 0.0–0.2)

## 2021-12-23 LAB — URINALYSIS, MICROSCOPIC (REFLEX): WBC, UA: >50 WBC/hpf (ref 0–5)

## 2021-12-23 LAB — SURGICAL PCR SCREEN
MRSA, PCR: POSITIVE — AB
Staphylococcus aureus: POSITIVE — AB

## 2021-12-23 LAB — MAGNESIUM: Magnesium: 1.6 mg/dL — ABNORMAL LOW (ref 1.7–2.4)

## 2021-12-23 LAB — HEMOGLOBIN A1C
Hgb A1c MFr Bld: 12.4 % — ABNORMAL HIGH (ref 4.8–5.6)
Mean Plasma Glucose: 309.18 mg/dL

## 2021-12-23 SURGERY — INCISION AND DRAINAGE, ABSCESS
Anesthesia: General | Laterality: Left

## 2021-12-23 MED ORDER — FERROUS SULFATE 325 (65 FE) MG PO TABS
325.0000 mg | ORAL_TABLET | Freq: Every day | ORAL | Status: DC
  Administered 2021-12-24 – 2021-12-30 (×7): 325 mg via ORAL
  Filled 2021-12-23 (×7): qty 1

## 2021-12-23 MED ORDER — OXYCODONE HCL 5 MG PO TABS
5.0000 mg | ORAL_TABLET | ORAL | Status: DC | PRN
  Administered 2021-12-24 – 2021-12-28 (×18): 10 mg via ORAL
  Administered 2021-12-28 – 2021-12-30 (×2): 5 mg via ORAL
  Filled 2021-12-23 (×4): qty 2
  Filled 2021-12-23: qty 1
  Filled 2021-12-23 (×6): qty 2
  Filled 2021-12-23: qty 1
  Filled 2021-12-23 (×8): qty 2

## 2021-12-23 MED ORDER — LACTATED RINGERS IV SOLN: INTRAVENOUS | Status: DC

## 2021-12-23 MED ORDER — HYDROMORPHONE HCL 1 MG/ML IJ SOLN
1.0000 mg | INTRAMUSCULAR | Status: DC | PRN
  Administered 2021-12-23 (×2): 1 mg via INTRAVENOUS
  Filled 2021-12-23 (×2): qty 1

## 2021-12-23 MED ORDER — SUGAMMADEX SODIUM 200 MG/2ML IV SOLN
INTRAVENOUS | Status: DC | PRN
  Administered 2021-12-23: 300 mg via INTRAVENOUS

## 2021-12-23 MED ORDER — ONDANSETRON HCL 4 MG/2ML IJ SOLN
INTRAMUSCULAR | Status: AC
  Filled 2021-12-23: qty 4

## 2021-12-23 MED ORDER — FENTANYL CITRATE (PF) 250 MCG/5ML IJ SOLN
INTRAMUSCULAR | Status: AC
  Filled 2021-12-23: qty 5

## 2021-12-23 MED ORDER — TOPIRAMATE 25 MG PO TABS
100.0000 mg | ORAL_TABLET | Freq: Every day | ORAL | Status: DC
  Administered 2021-12-23 – 2021-12-29 (×7): 100 mg via ORAL
  Filled 2021-12-23 (×8): qty 4

## 2021-12-23 MED ORDER — FENTANYL CITRATE (PF) 100 MCG/2ML IJ SOLN
INTRAMUSCULAR | Status: DC | PRN
  Administered 2021-12-23 (×2): 50 ug via INTRAVENOUS

## 2021-12-23 MED ORDER — ADULT MULTIVITAMIN W/MINERALS CH
1.0000 | ORAL_TABLET | Freq: Every day | ORAL | Status: DC
  Administered 2021-12-23 – 2021-12-30 (×8): 1 via ORAL
  Filled 2021-12-23 (×8): qty 1

## 2021-12-23 MED ORDER — CLOTRIMAZOLE 2 % VA CREA
1.0000 | TOPICAL_CREAM | Freq: Every day | VAGINAL | Status: DC
Start: 2021-12-23 — End: 2021-12-24
  Administered 2021-12-24: 1 via VAGINAL
  Filled 2021-12-23: qty 21

## 2021-12-23 MED ORDER — CHLORHEXIDINE GLUCONATE 0.12 % MT SOLN: 15.0000 mL | Freq: Once | OROMUCOSAL | Status: DC

## 2021-12-23 MED ORDER — ACETAMINOPHEN 325 MG PO TABS
650.0000 mg | ORAL_TABLET | Freq: Four times a day (QID) | ORAL | Status: DC | PRN
  Administered 2021-12-24: 650 mg via ORAL
  Filled 2021-12-23: qty 2

## 2021-12-23 MED ORDER — MUPIROCIN 2 % EX OINT
1.0000 "application " | TOPICAL_OINTMENT | Freq: Two times a day (BID) | CUTANEOUS | Status: AC
  Administered 2021-12-23 – 2021-12-28 (×10): 1 via NASAL
  Filled 2021-12-23 (×3): qty 22

## 2021-12-23 MED ORDER — HYDROCHLOROTHIAZIDE 12.5 MG PO TABS
12.5000 mg | ORAL_TABLET | Freq: Every day | ORAL | Status: DC
  Administered 2021-12-23: 12.5 mg via ORAL
  Filled 2021-12-23: qty 1

## 2021-12-23 MED ORDER — 0.9 % SODIUM CHLORIDE (POUR BTL) OPTIME
TOPICAL | Status: DC | PRN
  Administered 2021-12-23: 1000 mL

## 2021-12-23 MED ORDER — LIDOCAINE 2% (20 MG/ML) 5 ML SYRINGE
INTRAMUSCULAR | Status: DC | PRN
Start: 2021-12-23 — End: 2021-12-23
  Administered 2021-12-23: 60 mg via INTRAVENOUS

## 2021-12-23 MED ORDER — SUMATRIPTAN SUCCINATE 50 MG PO TABS
50.0000 mg | ORAL_TABLET | Freq: Once | ORAL | Status: AC
  Administered 2021-12-24: 50 mg via ORAL
  Filled 2021-12-23 (×2): qty 1

## 2021-12-23 MED ORDER — HYDROMORPHONE HCL 1 MG/ML IJ SOLN
0.5000 mg | INTRAMUSCULAR | Status: DC | PRN
  Administered 2021-12-24: 1 mg via INTRAVENOUS
  Filled 2021-12-23 (×2): qty 1

## 2021-12-23 MED ORDER — CYCLOBENZAPRINE HCL 5 MG PO TABS
10.0000 mg | ORAL_TABLET | Freq: Four times a day (QID) | ORAL | Status: DC | PRN
  Administered 2021-12-24 – 2021-12-26 (×2): 10 mg via ORAL
  Filled 2021-12-23 (×2): qty 2

## 2021-12-23 MED ORDER — HYDROMORPHONE HCL 1 MG/ML IJ SOLN
1.0000 mg | Freq: Once | INTRAMUSCULAR | Status: AC
  Administered 2021-12-23: 1 mg via INTRAVENOUS
  Filled 2021-12-23: qty 1

## 2021-12-23 MED ORDER — METRONIDAZOLE 500 MG/100ML IV SOLN
500.0000 mg | Freq: Once | INTRAVENOUS | Status: AC
  Administered 2021-12-23: 500 mg via INTRAVENOUS

## 2021-12-23 MED ORDER — ACETAMINOPHEN 10 MG/ML IV SOLN: 1000.0000 mg | Freq: Once | INTRAVENOUS | Status: DC | PRN

## 2021-12-23 MED ORDER — DULOXETINE HCL 60 MG PO CPEP
60.0000 mg | ORAL_CAPSULE | Freq: Every day | ORAL | Status: DC
  Administered 2021-12-23 – 2021-12-30 (×8): 60 mg via ORAL
  Filled 2021-12-23 (×8): qty 1

## 2021-12-23 MED ORDER — ACETAMINOPHEN 650 MG RE SUPP: 650.0000 mg | Freq: Four times a day (QID) | RECTAL | Status: DC | PRN

## 2021-12-23 MED ORDER — SODIUM CHLORIDE 0.9 % IR SOLN
Status: DC | PRN
  Administered 2021-12-23: 1000 mL

## 2021-12-23 MED ORDER — LISINOPRIL 20 MG PO TABS
20.0000 mg | ORAL_TABLET | Freq: Every day | ORAL | Status: DC
  Administered 2021-12-23 – 2021-12-30 (×8): 20 mg via ORAL
  Filled 2021-12-23 (×8): qty 1

## 2021-12-23 MED ORDER — HYDROMORPHONE HCL 1 MG/ML IJ SOLN
INTRAMUSCULAR | Status: AC
  Filled 2021-12-23: qty 1

## 2021-12-23 MED ORDER — INSULIN ASPART 100 UNIT/ML IJ SOLN
0.0000 [IU] | Freq: Three times a day (TID) | INTRAMUSCULAR | Status: DC
  Administered 2021-12-24 (×3): 5 [IU] via SUBCUTANEOUS
  Administered 2021-12-25: 2 [IU] via SUBCUTANEOUS
  Administered 2021-12-25: 3 [IU] via SUBCUTANEOUS
  Administered 2021-12-26 (×2): 2 [IU] via SUBCUTANEOUS
  Administered 2021-12-26: 3 [IU] via SUBCUTANEOUS
  Administered 2021-12-27: 2 [IU] via SUBCUTANEOUS
  Administered 2021-12-28: 3 [IU] via SUBCUTANEOUS
  Administered 2021-12-28: 5 [IU] via SUBCUTANEOUS
  Administered 2021-12-28 – 2021-12-29 (×2): 3 [IU] via SUBCUTANEOUS
  Administered 2021-12-30: 2 [IU] via SUBCUTANEOUS

## 2021-12-23 MED ORDER — VANCOMYCIN HCL 1750 MG/350ML IV SOLN
1750.0000 mg | INTRAVENOUS | Status: DC
  Administered 2021-12-24 – 2021-12-25 (×2): 1750 mg via INTRAVENOUS
  Filled 2021-12-23 (×2): qty 350

## 2021-12-23 MED ORDER — MULTIVITAMINS PO CAPS: 1.0000 | ORAL_CAPSULE | Freq: Every day | ORAL | Status: DC

## 2021-12-23 MED ORDER — FLUCONAZOLE 150 MG PO TABS
150.0000 mg | ORAL_TABLET | Freq: Every day | ORAL | Status: DC
  Administered 2021-12-23: 150 mg via ORAL
  Filled 2021-12-23: qty 1

## 2021-12-23 MED ORDER — NYSTATIN 100000 UNIT/GM EX POWD
Freq: Three times a day (TID) | CUTANEOUS | Status: DC
  Filled 2021-12-23: qty 15

## 2021-12-23 MED ORDER — INSULIN DETEMIR 100 UNIT/ML ~~LOC~~ SOLN: 10.0000 [IU] | Freq: Every day | SUBCUTANEOUS | Status: DC

## 2021-12-23 MED ORDER — ROCURONIUM BROMIDE 10 MG/ML (PF) SYRINGE
PREFILLED_SYRINGE | INTRAVENOUS | Status: DC | PRN
  Administered 2021-12-23: 60 mg via INTRAVENOUS

## 2021-12-23 MED ORDER — MIDAZOLAM HCL 2 MG/2ML IJ SOLN
INTRAMUSCULAR | Status: AC
  Filled 2021-12-23: qty 2

## 2021-12-23 MED ORDER — ORAL CARE MOUTH RINSE: 15.0000 mL | Freq: Once | OROMUCOSAL | Status: AC

## 2021-12-23 MED ORDER — CLOTRIMAZOLE 1 % EX CREA
TOPICAL_CREAM | Freq: Two times a day (BID) | CUTANEOUS | Status: DC
  Administered 2021-12-24 – 2021-12-30 (×4): 1 via TOPICAL
  Filled 2021-12-23: qty 15

## 2021-12-23 MED ORDER — ORAL CARE MOUTH RINSE: 15.0000 mL | Freq: Once | OROMUCOSAL | Status: DC

## 2021-12-23 MED ORDER — INSULIN DETEMIR 100 UNIT/ML ~~LOC~~ SOLN
10.0000 [IU] | Freq: Every day | SUBCUTANEOUS | Status: DC
Start: 2021-12-23 — End: 2021-12-23

## 2021-12-23 MED ORDER — DOCUSATE SODIUM 50 MG PO CAPS
50.0000 mg | ORAL_CAPSULE | Freq: Two times a day (BID) | ORAL | Status: DC
  Administered 2021-12-24 – 2021-12-29 (×12): 50 mg via ORAL
  Filled 2021-12-23 (×14): qty 1

## 2021-12-23 MED ORDER — NORGESTIMATE-ETH ESTRADIOL 0.25-35 MG-MCG PO TABS: 1.0000 | ORAL_TABLET | Freq: Every day | ORAL | Status: DC

## 2021-12-23 MED ORDER — LISINOPRIL-HYDROCHLOROTHIAZIDE 20-12.5 MG PO TABS: 1.0000 | ORAL_TABLET | Freq: Every day | ORAL | Status: DC

## 2021-12-23 MED ORDER — METRONIDAZOLE 500 MG/100ML IV SOLN
INTRAVENOUS | Status: AC
  Filled 2021-12-23: qty 100

## 2021-12-23 MED ORDER — ONDANSETRON HCL 4 MG/2ML IJ SOLN
INTRAMUSCULAR | Status: DC | PRN
Start: 2021-12-23 — End: 2021-12-23
  Administered 2021-12-23: 4 mg via INTRAVENOUS

## 2021-12-23 MED ORDER — HYDROMORPHONE HCL 1 MG/ML IJ SOLN
0.2500 mg | INTRAMUSCULAR | Status: DC | PRN
  Administered 2021-12-23 (×2): 0.5 mg via INTRAVENOUS

## 2021-12-23 MED ORDER — INSULIN DETEMIR 100 UNIT/ML ~~LOC~~ SOLN
10.0000 [IU] | Freq: Every day | SUBCUTANEOUS | Status: DC
  Administered 2021-12-23: 10 [IU] via SUBCUTANEOUS
  Filled 2021-12-23 (×2): qty 0.1

## 2021-12-23 MED ORDER — PROPOFOL 10 MG/ML IV BOLUS
INTRAVENOUS | Status: AC
  Filled 2021-12-23: qty 20

## 2021-12-23 MED ORDER — POTASSIUM CHLORIDE 10 MEQ/100ML IV SOLN
10.0000 meq | INTRAVENOUS | Status: AC
  Administered 2021-12-23 (×2): 10 meq via INTRAVENOUS
  Filled 2021-12-23: qty 100

## 2021-12-23 MED ORDER — MIDAZOLAM HCL 5 MG/5ML IJ SOLN
INTRAMUSCULAR | Status: DC | PRN
  Administered 2021-12-23: 2 mg via INTRAVENOUS

## 2021-12-23 MED ORDER — CHLORHEXIDINE GLUCONATE 0.12 % MT SOLN
15.0000 mL | Freq: Once | OROMUCOSAL | Status: AC
  Administered 2021-12-23: 15 mL via OROMUCOSAL

## 2021-12-23 MED ORDER — PROPOFOL 10 MG/ML IV BOLUS
INTRAVENOUS | Status: DC | PRN
Start: 2021-12-23 — End: 2021-12-23
  Administered 2021-12-23: 130 mg via INTRAVENOUS

## 2021-12-23 MED ORDER — LIP MEDEX EX OINT
TOPICAL_OINTMENT | CUTANEOUS | Status: DC | PRN
  Administered 2021-12-23 – 2021-12-29 (×4): 75 via TOPICAL
  Filled 2021-12-23: qty 7

## 2021-12-23 SURGICAL SUPPLY — 34 items
BAG COUNTER SPONGE SURGICOUNT (BAG) ×2 IMPLANT
BNDG GAUZE ELAST 4 BULKY (GAUZE/BANDAGES/DRESSINGS) ×1 IMPLANT
BRIEF STRETCH FOR OB PAD LRG (UNDERPADS AND DIAPERS) IMPLANT
CANISTER SUCT 3000ML PPV (MISCELLANEOUS) ×2 IMPLANT
COVER SURGICAL LIGHT HANDLE (MISCELLANEOUS) ×2 IMPLANT
DRAPE LAPAROSCOPIC ABDOMINAL (DRAPES) ×1 IMPLANT
DRAPE LAPAROTOMY 100X72 PEDS (DRAPES) ×1 IMPLANT
DRSG PAD ABDOMINAL 8X10 ST (GAUZE/BANDAGES/DRESSINGS) IMPLANT
ELECT CAUTERY BLADE 6.4 (BLADE) ×2 IMPLANT
ELECT REM PT RETURN 9FT ADLT (ELECTROSURGICAL) ×2
ELECTRODE REM PT RTRN 9FT ADLT (ELECTROSURGICAL) ×1 IMPLANT
GAUZE PACKING IODOFORM 1/2 (PACKING) IMPLANT
GAUZE PACKING IODOFORM 1/4X15 (PACKING) IMPLANT
GAUZE SPONGE 4X4 12PLY STRL (GAUZE/BANDAGES/DRESSINGS) IMPLANT
GLOVE SURG ENC MOIS LTX SZ6 (GLOVE) ×2 IMPLANT
GLOVE SURG UNDER LTX SZ6.5 (GLOVE) ×2 IMPLANT
GOWN STRL REUS W/ TWL LRG LVL3 (GOWN DISPOSABLE) ×1 IMPLANT
GOWN STRL REUS W/TWL 2XL LVL3 (GOWN DISPOSABLE) ×2 IMPLANT
GOWN STRL REUS W/TWL LRG LVL3 (GOWN DISPOSABLE) ×2
KIT BASIN OR (CUSTOM PROCEDURE TRAY) ×2 IMPLANT
KIT TURNOVER KIT B (KITS) ×2 IMPLANT
NS IRRIG 1000ML POUR BTL (IV SOLUTION) ×2 IMPLANT
PACK GENERAL/GYN (CUSTOM PROCEDURE TRAY) ×2 IMPLANT
PAD ABD 8X10 STRL (GAUZE/BANDAGES/DRESSINGS) ×4 IMPLANT
PAD ARMBOARD 7.5X6 YLW CONV (MISCELLANEOUS) ×2 IMPLANT
PANTS MESH DISP 2XL (UNDERPADS AND DIAPERS) IMPLANT
PANTS MESH DISPOSABLE 2XL (UNDERPADS AND DIAPERS) ×1
PENCIL SMOKE EVACUATOR (MISCELLANEOUS) ×2 IMPLANT
SLEEVE SCD COMPRESS KNEE MED (STOCKING) ×1 IMPLANT
SUT ETHILON 2 0 FS 18 (SUTURE) ×2 IMPLANT
TOWEL GREEN STERILE (TOWEL DISPOSABLE) ×2 IMPLANT
TOWEL GREEN STERILE FF (TOWEL DISPOSABLE) ×2 IMPLANT
TRAY FOLEY W/BAG SLVR 14FR (SET/KITS/TRAYS/PACK) ×1 IMPLANT
UNDERPAD 30X36 HEAVY ABSORB (UNDERPADS AND DIAPERS) IMPLANT

## 2021-12-23 NOTE — Assessment & Plan Note (Signed)
Followed by neurology with ocrevus q 6 months

## 2021-12-23 NOTE — Consult Note (Signed)
I have reviewed this patient's chart and spoken with Dr. Barry Dienes from general surgery regarding her CT scan.  On the CT scan she does have some small air bubbles in her bladder consistent with emphysematous cystitis.  There may be additional free air adjacent to her bladder.  I do not suspect that the patient has developed bladder perforation.  Rather that this area may be result of her severe infection.  Would recommend a Foley catheter and appropriate antimicrobial coverage.  Catheter should stay in the initial treatment phase for bladder decompression.  It can be removed after 3 to 4 days.  Would consider Candida but potential source for her emphysematous cystitis and cover her appropriately.     There are any additional questions regarding any urologic issues in this patient please let me know.  Otherwise I will see her as needed.

## 2021-12-23 NOTE — Assessment & Plan Note (Signed)
Continue daily iron, hgb stable

## 2021-12-23 NOTE — Transfer of Care (Signed)
Immediate Anesthesia Transfer of Care Note  Patient: Katrina Montes  Procedure(s) Performed: INCISION AND DRAINAGE BUTTOCK ABSCESS (Left)  Patient Location: PACU  Anesthesia Type:General  Level of Consciousness: awake and alert   Airway & Oxygen Therapy: Patient Spontanous Breathing  Post-op Assessment: Report given to RN and Post -op Vital signs reviewed and stable  Post vital signs: Reviewed and stable  Last Vitals:  Vitals Value Taken Time  BP 123/71 12/23/21 2102  Temp 36.7 C 12/23/21 2100  Pulse 106 12/23/21 2106  Resp 12 12/23/21 2106  SpO2 96 % 12/23/21 2106  Vitals shown include unvalidated device data.  Last Pain:  Vitals:   12/23/21 1816  TempSrc:   PainSc: 6          Complications: No notable events documented.

## 2021-12-23 NOTE — Assessment & Plan Note (Signed)
Well controlled, continue her lisinopril-hctz

## 2021-12-23 NOTE — Assessment & Plan Note (Addendum)
Checking UA/culture, on broad spectrum abx Consulting urology with possible perforation Treating vaginal yeast infection

## 2021-12-23 NOTE — Interval H&P Note (Signed)
History and Physical Interval Note:  12/23/2021 6:39 PM  Katrina Montes  has presented today for surgery, with the diagnosis of Left Buttock Abscess.  The various methods of treatment have been discussed with the patient and family. After consideration of risks, benefits and other options for treatment, the patient has consented to  Procedure(s): INCISION AND DRAINAGE BUTTOCK ABSCESS (Left) as a surgical intervention.  The patient's history has been reviewed, patient examined, no change in status, stable for surgery.  I have reviewed the patient's chart and labs.  Questions were answered to the patient's satisfaction.     Stark Klein

## 2021-12-23 NOTE — Assessment & Plan Note (Addendum)
Continue cymbalta, holding effexor/pamelor with prolonged qt

## 2021-12-23 NOTE — Progress Notes (Signed)
Pt arrived to floor around 1422 via stretcher by [facility]. Pt alert and oriented x4 in no acute distress upon arrival. VSS. Respirations even and unlabored on room air. Insulin gtt infusing at 2.6 units/hr. LR with D5 infusing at 125 ml/hr.   Pt oriented to room. Pt instructed on use of call bell. Call bell within reach. Bed in low position.

## 2021-12-23 NOTE — Anesthesia Preprocedure Evaluation (Addendum)
Anesthesia Evaluation  Patient identified by MRN, date of birth, ID band Patient awake    Reviewed: Allergy & Precautions, NPO status , Patient's Chart, lab work & pertinent test results  Airway Mallampati: II  TM Distance: >3 FB Neck ROM: Full    Dental no notable dental hx. (+) Teeth Intact, Dental Advisory Given   Pulmonary sleep apnea ,    Pulmonary exam normal breath sounds clear to auscultation       Cardiovascular hypertension, Normal cardiovascular exam Rhythm:Regular Rate:Normal     Neuro/Psych  Headaches, Anxiety Depression MS  Neuromuscular disease    GI/Hepatic negative GI ROS, Neg liver ROS,   Endo/Other  diabetes, Poorly Controlled, Insulin DependentMorbid obesity  Renal/GU negative Renal ROS  negative genitourinary   Musculoskeletal negative musculoskeletal ROS (+)   Abdominal   Peds negative pediatric ROS (+)  Hematology  (+) Blood dyscrasia, anemia ,   Anesthesia Other Findings   Reproductive/Obstetrics negative OB ROS                            Anesthesia Physical Anesthesia Plan  ASA: 4  Anesthesia Plan: General   Post-op Pain Management:    Induction: Intravenous  PONV Risk Score and Plan: 3 and Ondansetron and Treatment may vary due to age or medical condition  Airway Management Planned: Oral ETT  Additional Equipment:   Intra-op Plan:   Post-operative Plan: Extubation in OR  Informed Consent: I have reviewed the patients History and Physical, chart, labs and discussed the procedure including the risks, benefits and alternatives for the proposed anesthesia with the patient or authorized representative who has indicated his/her understanding and acceptance.     Dental advisory given  Plan Discussed with: CRNA and Surgeon  Anesthesia Plan Comments:         Anesthesia Quick Evaluation

## 2021-12-23 NOTE — ED Provider Notes (Signed)
°  Physical Exam  BP (!) 143/98    Pulse 93    Temp 98.2 F (36.8 C) (Oral)    Resp (!) 23    Ht 5\' 6"  (1.676 m)    Wt 127 kg    SpO2 97%    BMI 45.19 kg/m   Physical Exam  Procedures  Procedures  ED Course / MDM    Medical Decision Making Patient on insulin drip.  Has been here for around 16 hours waiting for a bed.  Rechecking CMP.  Does have hypokalemia and will supplement.  Also will add a urine to see if she is cleared any ketones.        Davonna Belling, MD 12/23/21 1354

## 2021-12-23 NOTE — H&P (View-Only) (Signed)
Katrina Montes 12-07-1971  [id number].    Requesting MD: Dr. Carollee Herter Chief Complaint/Reason for Consult: buttock abscess  HPI:  This is a 50yo with a history of uncontrolled type 2 DM, anemia, HTN, MS, and diabetic neuropathy who has had over a week's worth of left buttock pain that is severe in nature and sharp.  It has been draining purulent fluid according to the patient.  She states she has been having fevers, Tmax 101 at home, but none noted since in the ED.  She has also noted some chills, nausea, and malaise.  She has no idea what her blood sugars have been as she hasn't checked her sugars in over a month and has run out of her diabetes medications. She complains of frequency and urgency with urinating over the last week. Nothing seemed to help her pain or symptoms and so she presented to the [facility] ED where she was noted to have cellulitis of her L buttock and a CT scan that confirms this but not a definitive abscess.  She is also noted to have emphysematous cystitis.  Her WBC is 27K yesterday, but none checked today.  His CBG was over 500 and has been started on an insulin gtt.  We have been asked to see her for further evaluation and recommendations.  ROS: ROS: Please see HPI, otherwise all other systems have been reviewed and are negative.  Family History  Problem Relation Age of Onset   Cancer Mother    Diabetes Mother    Hearing loss Mother    Thyroid nodules Mother    Anemia Mother    Kidney disease Mother    Hypertension Mother    Obesity Mother    Hyperlipidemia Father    High blood pressure Father    Sleep apnea Father    Anemia Sister    Arthritis Maternal Grandmother    Arthritis Maternal Grandfather    Arthritis Paternal Grandmother    Kidney disease Paternal Grandmother    Hypertension Paternal Grandmother    Arthritis Paternal Grandfather    Vision loss Paternal Grandfather    Stroke Paternal Grandfather    Hyperlipidemia Paternal Grandfather     Anemia Sister     Past Medical History:  Diagnosis Date   Anemia    Anxiety    Back pain    Carpal tunnel syndrome    Constipation    Depression    Diabetes mellitus    Hypertension    Joint pain    Lactose intolerance    Multiple sclerosis (HCC)    Optic neuritis    Shortness of breath    Swallowing difficulty    Swelling of both lower extremities    Vision, loss, sudden    Vitamin D deficiency     Past Surgical History:  Procedure Laterality Date   BREAST REDUCTION SURGERY     CYST EXCISION     ovarian cyst removed     TUBAL LIGATION     WISDOM TOOTH EXTRACTION      Social History:  reports that she has never smoked. She has never used smokeless tobacco. She reports that she does not drink alcohol and does not use drugs.  Allergies: No Known Allergies  (Not in a hospital admission)    Physical Exam: Blood pressure (!) 143/98, pulse 93, temperature 98.2 F (36.8 C), temperature source Oral, resp. rate (!) 23, height  (1.676 m), weight 127 kg, SpO2 97 %. General: pleasant, morbidly  obese female who is laying in bed in NAD HEENT: head is normocephalic, atraumatic.  Sclera are noninjected.  PERRL.  Ears and nose without any masses or lesions.  Mouth is pink and moist Heart: regular, rate, and rhythm.  Normal s1,s2. No obvious murmurs, gallops, or rubs noted.  Palpable radial and pedal pulses bilaterally Lungs: CTAB, no wheezes, rhonchi, or rales noted.  Respiratory effort nonlabored Abd: soft, NT, morbidly obese, +BS, no masses, hernias, or organomegaly GU: normal female anatomy.  Significant white patchy areas noted on her labia, vagina, and her inguinal creases c/w vaginal candidiasis. MS: all 4 extremities are symmetrical with no cyanosis, clubbing, or edema. Skin: warm and dry with no masses, lesions, or rashes.  She has significant induration of her left medial gluteus as well as erythema.  There is an areas of spontaneous bloody purulent drainage at the  superior aspect of her gluteus.  Her induration tracks all the way down to her perineum, but spares this and her labia.   Neuro: Cranial nerves 2-12 grossly intact, sensation is normal throughout Psych: A&Ox3 with an appropriate affect.   Results for orders placed or performed during the hospital encounter of 12/22/21 (from the past 48 hour(s))  CBG monitoring, ED     Status: Abnormal   Collection Time: 12/22/21  5:53 PM  Result Value Ref Range   Glucose-Capillary 533 (HH) 70 - 99 mg/dL    Comment: Glucose reference range applies only to samples taken after fasting for at least 8 hours.   Comment 1 A   Blood Culture (routine x 2)     Status: None (Preliminary result)   Collection Time: 12/22/21  6:05 PM   Specimen: BLOOD  Result Value Ref Range   Specimen Description      BLOOD LEFT ANTECUBITAL Performed at [facility], [address], [place], [place] [address]    Special Requests      Blood Culture adequate volume BOTTLES DRAWN AEROBIC AND ANAEROBIC Performed at [facility], [address], [place], [place] [address]    Culture      NO GROWTH < 24 HOURS Performed at [facility], [address], [place], [place] [address]    Report Status PENDING   Lactic acid, plasma     Status: None   Collection Time: 12/22/21  6:23 PM  Result Value Ref Range   Lactic Acid, Venous 1.9 0.5 - 1.9 mmol/L    Comment: Performed at [facility], [address], [place], [place] [address]  Comprehensive metabolic panel     Status: Abnormal   Collection Time: 12/22/21  6:23 PM  Result Value Ref Range   Sodium 131 (L) 135 - 145 mmol/L   Potassium 3.4 (L) 3.5 - 5.1 mmol/L   Chloride 91 (L) 98 - 111 mmol/L   CO2 25 22 - 32 mmol/L   Glucose, Bld 560 (HH) 70 - 99 mg/dL    Comment: Glucose reference range applies only to samples taken after fasting for at least 8 hours. CRITICAL RESULT CALLED TO, READ BACK BY AND VERIFIED WITH: A  POWELL,RN 1922 12/22/21 DBRADLEY    BUN 15 6 - 20 mg/dL   Creatinine, Ser 2.95 (H) 0.44 - 1.00 mg/dL   Calcium 9.5 8.9 - 62.1 mg/dL   Total Protein 8.4 (H) 6.5 - 8.1 g/dL   Albumin 3.7 3.5 - 5.0 g/dL   AST 10 (L) 15 - 41 U/L   ALT 10 0 - 44 U/L  Alkaline Phosphatase 157 (H) 38 - 126 U/L   Total Bilirubin 0.8 0.3 - 1.2 mg/dL   GFR, Estimated >60 >45 mL/min    Comment: (NOTE) Calculated using the CKD-EPI Creatinine Equation (2021)    Anion gap 15 5 - 15    Comment: Performed at [facility], [address], [place], [place] [address]  CBC WITH DIFFERENTIAL     Status: Abnormal   Collection Time: 12/22/21  6:23 PM  Result Value Ref Range   WBC 27.2 (H) 4.0 - 10.5 K/uL   RBC 4.38 3.87 - 5.11 MIL/uL   Hemoglobin 11.6 (L) 12.0 - 15.0 g/dL   HCT 19.1 (L) 47.8 - 29.5 %   MCV 81.5 80.0 - 100.0 fL   MCH 26.5 26.0 - 34.0 pg   MCHC 32.5 30.0 - 36.0 g/dL   RDW 62.1 30.8 - 65.7 %   Platelets 444 (H) 150 - 400 K/uL   nRBC 0.0 0.0 - 0.2 %   Neutrophils Relative % 88 %   Neutro Abs 23.7 (H) 1.7 - 7.7 K/uL   Lymphocytes Relative 3 %   Lymphs Abs 0.8 0.7 - 4.0 K/uL   Monocytes Relative 7 %   Monocytes Absolute 2.0 (H) 0.1 - 1.0 K/uL   Eosinophils Relative 0 %   Eosinophils Absolute 0.1 0.0 - 0.5 K/uL   Basophils Relative 0 %   Basophils Absolute 0.1 0.0 - 0.1 K/uL   Immature Granulocytes 2 %   Abs Immature Granulocytes 0.56 (H) 0.00 - 0.07 K/uL    Comment: Performed at [facility], [address] [place], [place] [address]  Protime-INR     Status: None   Collection Time: 12/22/21  6:23 PM  Result Value Ref Range   Prothrombin Time 14.5 11.4 - 15.2 seconds   INR 1.1 0.8 - 1.2    Comment: (NOTE) INR goal varies based on device and disease states. Performed at [facility], [address] [place], [place] [address]   APTT     Status: None   Collection Time: 12/22/21  6:23 PM  Result Value Ref Range   aPTT 29 24 - 36  seconds    Comment: Performed at [facility], [address] [place], [place] [address]  Blood Culture (routine x 2)     Status: None (Preliminary result)   Collection Time: 12/22/21  7:00 PM   Specimen: BLOOD  Result Value Ref Range   Specimen Description      BLOOD BLOOD RIGHT FOREARM Performed at [facility], [address] [place], [place] [address]    Special Requests      Blood Culture adequate volume BOTTLES DRAWN AEROBIC AND ANAEROBIC Performed at [facility], [address] [place], [place] [address]    Culture      NO GROWTH < 24 HOURS Performed at [facility], [address], [place], [place] [address]    Report Status PENDING   Resp Panel by RT-PCR (Flu A&B, Covid) Nasopharyngeal Swab     Status: None   Collection Time: 12/22/21  7:04 PM   Specimen: Nasopharyngeal Swab; Nasopharyngeal(NP) swabs in vial transport medium  Result Value Ref Range   SARS Coronavirus 2 by RT PCR NEGATIVE NEGATIVE    Comment: (NOTE) SARS-CoV-2 target nucleic acids are NOT DETECTED.  The SARS-CoV-2 RNA is generally detectable in upper respiratory specimens during the acute phase of infection. The lowest concentration of SARS-CoV-2 viral copies this assay can detect  is 138 copies/mL. A negative result does not preclude SARS-Cov-2 infection and should not be used as the sole basis for treatment or other patient management decisions. A negative result may occur with  improper specimen collection/handling, submission of specimen other than nasopharyngeal swab, presence of viral mutation(s) within the areas targeted by this assay, and inadequate number of viral copies(<138 copies/mL). A negative result must be combined with clinical observations, patient history, and epidemiological information. The expected result is Negative.  Fact Sheet for Patients:  BloggerCourse.com  Fact Sheet for  Healthcare Providers:  SeriousBroker.it  This test is no t yet approved or cleared by the [place] FDA and  has been authorized for detection and/or diagnosis of SARS-CoV-2 by FDA under an Emergency Use Authorization (EUA). This EUA will remain  in effect (meaning this test can be used) for the duration of the COVID-19 declaration under Section 564(b)(1) of the Act, 21 U.S.C.section 360bbb-3(b)(1), unless the authorization is terminated  or revoked sooner.       Influenza A by PCR NEGATIVE NEGATIVE   Influenza B by PCR NEGATIVE NEGATIVE    Comment: (NOTE) The Xpert Xpress SARS-CoV-2/FLU/RSV plus assay is intended as an aid in the diagnosis of influenza from Nasopharyngeal swab specimens and should not be used as a sole basis for treatment. Nasal washings and aspirates are unacceptable for Xpert Xpress SARS-CoV-2/FLU/RSV testing.  Fact Sheet for Patients: BloggerCourse.com  Fact Sheet for Healthcare Providers: SeriousBroker.it  This test is not yet approved or cleared by the [place] FDA and has been authorized for detection and/or diagnosis of SARS-CoV-2 by FDA under an Emergency Use Authorization (EUA). This EUA will remain in effect (meaning this test can be used) for the duration of the COVID-19 declaration under Section 564(b)(1) of the Act, 21 U.S.C. section 360bbb-3(b)(1), unless the authorization is terminated or revoked.  Performed at [facility], [address], [place], [place] [address]   CBG monitoring, ED     Status: Abnormal   Collection Time: 12/22/21  7:16 PM  Result Value Ref Range   Glucose-Capillary 472 (H) 70 - 99 mg/dL    Comment: Glucose reference range applies only to samples taken after fasting for at least 8 hours.  CBG monitoring, ED     Status: Abnormal   Collection Time: 12/22/21  8:04 PM  Result Value Ref Range   Glucose-Capillary 389 (H)  70 - 99 mg/dL    Comment: Glucose reference range applies only to samples taken after fasting for at least 8 hours.  CBG monitoring, ED     Status: Abnormal   Collection Time: 12/22/21  9:14 PM  Result Value Ref Range   Glucose-Capillary 291 (H) 70 - 99 mg/dL    Comment: Glucose reference range applies only to samples taken after fasting for at least 8 hours.  CBG monitoring, ED     Status: Abnormal   Collection Time: 12/22/21 10:16 PM  Result Value Ref Range   Glucose-Capillary 181 (H) 70 - 99 mg/dL    Comment: Glucose reference range applies only to samples taken after fasting for at least 8 hours.  CBG monitoring, ED     Status: Abnormal   Collection Time: 12/22/21 11:22 PM  Result Value Ref Range   Glucose-Capillary 162 (H) 70 - 99 mg/dL    Comment: Glucose reference range applies only to samples taken after fasting for at least 8 hours.  CBG monitoring, ED     Status: Abnormal   Collection Time: 12/23/21  12:26 AM  Result Value Ref Range   Glucose-Capillary 187 (H) 70 - 99 mg/dL    Comment: Glucose reference range applies only to samples taken after fasting for at least 8 hours.  CBG monitoring, ED     Status: Abnormal   Collection Time: 12/23/21  1:34 AM  Result Value Ref Range   Glucose-Capillary 179 (H) 70 - 99 mg/dL    Comment: Glucose reference range applies only to samples taken after fasting for at least 8 hours.  CBG monitoring, ED     Status: Abnormal   Collection Time: 12/23/21  2:40 AM  Result Value Ref Range   Glucose-Capillary 172 (H) 70 - 99 mg/dL    Comment: Glucose reference range applies only to samples taken after fasting for at least 8 hours.  CBG monitoring, ED     Status: Abnormal   Collection Time: 12/23/21  3:46 AM  Result Value Ref Range   Glucose-Capillary 161 (H) 70 - 99 mg/dL    Comment: Glucose reference range applies only to samples taken after fasting for at least 8 hours.  CBG monitoring, ED     Status: Abnormal   Collection Time: 12/23/21   5:50 AM  Result Value Ref Range   Glucose-Capillary 163 (H) 70 - 99 mg/dL    Comment: Glucose reference range applies only to samples taken after fasting for at least 8 hours.  CBG monitoring, ED     Status: Abnormal   Collection Time: 12/23/21  8:06 AM  Result Value Ref Range   Glucose-Capillary 169 (H) 70 - 99 mg/dL    Comment: Glucose reference range applies only to samples taken after fasting for at least 8 hours.  CBG monitoring, ED     Status: Abnormal   Collection Time: 12/23/21  9:56 AM  Result Value Ref Range   Glucose-Capillary 178 (H) 70 - 99 mg/dL    Comment: Glucose reference range applies only to samples taken after fasting for at least 8 hours.  CBG monitoring, ED     Status: Abnormal   Collection Time: 12/23/21 11:35 AM  Result Value Ref Range   Glucose-Capillary 157 (H) 70 - 99 mg/dL    Comment: Glucose reference range applies only to samples taken after fasting for at least 8 hours.  Basic metabolic panel     Status: Abnormal   Collection Time: 12/23/21 12:40 PM  Result Value Ref Range   Sodium 136 135 - 145 mmol/L   Potassium 2.9 (L) 3.5 - 5.1 mmol/L   Chloride 100 98 - 111 mmol/L   CO2 29 22 - 32 mmol/L   Glucose, Bld 173 (H) 70 - 99 mg/dL    Comment: Glucose reference range applies only to samples taken after fasting for at least 8 hours.   BUN 13 6 - 20 mg/dL   Creatinine, Ser 0.35 0.44 - 1.00 mg/dL   Calcium 8.5 (L) 8.9 - 10.3 mg/dL   GFR, Estimated >46 >56 mL/min    Comment: (NOTE) Calculated using the CKD-EPI Creatinine Equation (2021)    Anion gap 7 5 - 15    Comment: Performed at [facility], [address], [place], [place] [address]  CBG monitoring, ED     Status: Abnormal   Collection Time: 12/23/21  1:15 PM  Result Value Ref Range   Glucose-Capillary 147 (H) 70 - 99 mg/dL    Comment: Glucose reference range applies only to samples taken after fasting for at least 8 hours.   Comment  1 RN AWARE    CT ABDOMEN PELVIS W  CONTRAST  Result Date: 12/22/2021 CLINICAL DATA:  Left lower quadrant abdominal pain. Left buttocks abscess. EXAM: CT ABDOMEN AND PELVIS WITH CONTRAST TECHNIQUE: Multidetector CT imaging of the abdomen and pelvis was performed using the standard protocol following bolus administration of intravenous contrast. CONTRAST:  OMNIPAQUE IOHEXOL 300 MG/ML  SOLN COMPARISON:  None. FINDINGS: Lower chest: No acute abnormality. Hepatobiliary: There is a heterogeneously enhancing lesion in the posterior right lobe of the liver measuring 2.4 by 1.6 cm which is more homogeneous with surrounding liver on delayed imaging most compatible with hepatic hemangioma. Liver and bile ducts are within normal limits. Pancreas: Unremarkable. No pancreatic ductal dilatation or surrounding inflammatory changes. Spleen: Normal in size without focal abnormality. Adrenals/Urinary Tract: The kidneys and adrenal glands are within normal limits. There is mild bladder wall emphysema. There is a tiny amount of extraluminal gas image 2/80. Stomach/Bowel: Stomach is within normal limits. Appendix appears normal. No evidence of bowel wall thickening, distention, or inflammatory changes. There is colonic diverticulosis without evidence for acute diverticulitis. Vascular/Lymphatic: IVC and aorta are normal in size. There are enlarged left inguinal lymph nodes measuring up to 12 mm short axis. Reproductive: Uterus and bilateral adnexa are unremarkable. Other: There is no ascites or focal abdominal wall hernia. There is marked subcutaneous edema in the medial left buttocks near the gluteal cleft extending to the level of the left rectum. There are no rim enhancing fluid collections. There is no soft tissue gas in this region. Ill-defined fluid seen near the left gluteal cleft posteriorly image 2/89 measuring 4.2 x 3.6 x 3.9 cm and posterior to the left rectum/anus image 2/97 measuring 7.4 x 2.4 by 2.6 cm. Musculoskeletal: There are degenerative changes  at L4-L5. No acute fractures are seen. No osseous erosions are identified. IMPRESSION: 1. Subcutaneous fluid and stranding in the left buttock extending to the rectum. Two ill-defined fluid collections are identified in this region which may represent phlegmon or early abscess formation. No rim enhancing collections are identified. No evidence for soft tissue gas in this region. 2. Findings worrisome for emphysematous cystitis. There is also small amount of extraluminal gas near the bladder, indeterminate. Small amount of bladder perforation can not be excluded. Electronically Signed   By: Darliss Cheney M.D.   On: 12/22/2021 20:13   DG Chest Port 1 View  Result Date: 12/22/2021 CLINICAL DATA:  Sepsis EXAM: PORTABLE CHEST 1 VIEW COMPARISON:  None. FINDINGS: Lungs are clear. No pneumothorax or pleural effusion. Cardiac size within normal limits. Pulmonary vascularity is normal. Osseous structures are age-appropriate. No acute bone abnormality. IMPRESSION: No active disease. Electronically Signed   By: Helyn Numbers M.D.   On: 12/22/2021 18:52      Assessment/Plan Left buttock abscess Imaging, labs, and chart have been reviewed.  We will obtain a new CBC to follow her WBC.  She is already on abx therapy including Maxipime and Vancomycin.  After evaluation of her buttock, she will need I&D in the operating room to further correct this problem.  We discussed the procedure and what it entails.  We also discussed risks and complications including, but not limited to bleeding, further infection requiring multiple operations, as well as delayed wound healing secondary to her uncontrolled DM.  She understands and is agreeable to proceed.  Discussed patient and treatment plans with primary service.  Vaginal Candidiasis Given her significant amount of infection as well as her uncontrolled DM, we will start  diflucan  x3 days for complex infection.  We will also treat topically with nystatin powder as well.     FEN - NPO/IVFs VTE - can start to follow OR ID - Maxipime/Vanc/Diflucan  DM HTN Anemia Diabetic neuropathy MS Emphysematous cystitis  High Medical Decision Making  Letha Cape, [facility] 12/23/2021, 2:09 PM Please see Amion for pager number during day hours 7:00am-4:30pm or 7:00am -11:30am on weekends

## 2021-12-23 NOTE — Anesthesia Postprocedure Evaluation (Signed)
Anesthesia Post Note  Patient: Katrina Montes  Procedure(s) Performed: INCISION AND DRAINAGE BUTTOCK ABSCESS (Left)     Patient location during evaluation: PACU Anesthesia Type: General Level of consciousness: awake and alert Pain management: pain level controlled Vital Signs Assessment: post-procedure vital signs reviewed and stable Respiratory status: spontaneous breathing, nonlabored ventilation and respiratory function stable Cardiovascular status: blood pressure returned to baseline and stable Postop Assessment: no apparent nausea or vomiting Anesthetic complications: no   No notable events documented.  Last Vitals:  Vitals:   12/23/21 2130 12/23/21 2207  BP: 113/60 138/75  Pulse: (!) 104 (!) 104  Resp: (!) 22 (!) 25  Temp: 36.7 C 36.8 C  SpO2: 93% 95%    Last Pain:  Vitals:   12/23/21 2207  TempSrc: Oral  PainSc:                  Oviya Ammar,W. EDMOND

## 2021-12-23 NOTE — Progress Notes (Signed)
Levemir 10 units given at 1747, insulin drip can be stopped at 1947. Called to make OR nurse aware to stop insulin drip at at 1947,

## 2021-12-23 NOTE — Assessment & Plan Note (Signed)
51 year old with uncontrolled type 2 diabetes and   Presenting with worsening left gluteal abscess that needs to be drained in OR Admit to telemetry General surgery has already seen and plans for OR tonight for I&D NPO/SCDs Continue with vanc/cefepime for now Consulting urology and will let surgery know if any surgical needs regarding emphysematous cystitis/possible bladder perforation.

## 2021-12-23 NOTE — Assessment & Plan Note (Signed)
Is supposed to be on cpap, but no using at this time. Does not have at home per her mother.

## 2021-12-23 NOTE — ED Notes (Signed)
Pt changed and wound cleaned with new border bandage

## 2021-12-23 NOTE — Assessment & Plan Note (Signed)
Complicated vaginal candidiasis in uncontrolled diabetic Will start with topical therapy with intravaginal clotrimazole x 7-14 days as well as topical clotrimazole over the labia BID Could also consider diflucan q 72 hours x 3 doses, but with qt prolongation will hold on this for now.

## 2021-12-23 NOTE — Op Note (Signed)
Incision and Drainage Procedure Note   Pre-operative Diagnosis: Left buttock abscess and candidiasis  Post-operative Diagnosis: same   Indications:   Anesthesia: General   Procedure Details  The procedure, risks and complications have been discussed in detail (including, infection, bleeding, need for additional procedures) with the patient, and the patient has signed consent to the procedure. The patient was informed that the wound would be left open.  The patient was taken to OR 7 and general anesthesia was induced on the stretcher.  A foley catheter was placed.     The patient was placed into the prone position.  The skin was sterilely prepped and draped over the affected area in the usual fashion. Time out was performed according to the surgical safety checklist.   The patient had a draining area to the left of the gluteal cleft.  Copious purulent drainage was present.  Cultures were sent.    The dead tissue here was debrided sharply with a #10 blade.  Approximately 3x3x1 cm of skin and subcutaneous fat was debrided in this location.  There was also necrotic tissue on the left perirectal region.  This was also debrided sharply with a #10 blade for an additional 2.5x3x1 cm of skin and subcu fat.  The abscess continued up the perineum for another 4 cm.  There was not necrotic skin in this location, so it was just opened up to allow drainage.    The pulse lavage was used to irrigate the cavity. A 1/2 inch penrose was passed through from the gluteal incision to the perineal incision and secured with interrupted 2-0 nylon, two sutures at each location. The wound was packed with betadine soaked kerlix.  The patient was awakened from anesthesia and taken to the PACU in stable condition.   Findings:  Copious purulent drainage with small amount of necrotic tissue  EBL: min  Drains: None   Condition: Tolerated procedure well   Complications:  none known.

## 2021-12-23 NOTE — Assessment & Plan Note (Addendum)
BS >500 on admission, no gap or other indications of DKA Has been off her insulin x 1 month and has infection likely driving elevated BS Blood sugars have been 170-180, still NPO Giving 10 units of levemir and will stop drip in 2 hours, will likely need this titrated up. Was on 30 units outpatient.  Frequent cbg q 1 hour x 4 Repeat a1c pending SW consult for PCP needs   Recent Labs  Lab 12/26/21 0212 12/27/21 0111 12/28/21 1021  HGB 9.1* 9.5* 9.7*  HCT 29.7* 30.4* 30.6*  WBC 13.2* 12.9* 10.9*  PLT 464* 453* 481*

## 2021-12-23 NOTE — Assessment & Plan Note (Addendum)
New finding on ekg and likely secondary to low potassium Checking magnesium, repleting potassium Continue telemetry Repeat ekg in am and avoid qt prolonging drugs Holding home effexor/pamelor

## 2021-12-23 NOTE — Consult Note (Signed)
° ° ° °Katrina Montes °03/07/1971  °[id number].   ° °Requesting MD: Dr. Eric Chen °Chief Complaint/Reason for Consult: buttock abscess ° °HPI:  °This is a 50yo with a history of uncontrolled type 2 DM, anemia, HTN, MS, and diabetic neuropathy who has had over a week's worth of left buttock pain that is severe in nature and sharp.  It has been draining purulent fluid according to the patient.  She states she has been having fevers, Tmax 101 at home, but none noted since in the ED.  She has also noted some chills, nausea, and malaise.  She has no idea what her blood sugars have been as she hasn't checked her sugars in over a month and has run out of her diabetes medications. She complains of frequency and urgency with urinating over the last week. Nothing seemed to help her pain or symptoms and so she presented to the [facility] ED where she was noted to have cellulitis of her L buttock and a CT scan that confirms this but not a definitive abscess.  She is also noted to have emphysematous cystitis.  Her WBC is 27K yesterday, but none checked today.  His CBG was over 500 and has been started on an insulin gtt.  We have been asked to see her for further evaluation and recommendations. ° °ROS: °ROS: Please see HPI, otherwise all other systems have been reviewed and are negative. ° °Family History  °Problem Relation Age of Onset  ° Cancer Mother   ° Diabetes Mother   ° Hearing loss Mother   ° Thyroid nodules Mother   ° Anemia Mother   ° Kidney disease Mother   ° Hypertension Mother   ° Obesity Mother   ° Hyperlipidemia Father   ° High blood pressure Father   ° Sleep apnea Father   ° Anemia Sister   ° Arthritis Maternal Grandmother   ° Arthritis Maternal Grandfather   ° Arthritis Paternal Grandmother   ° Kidney disease Paternal Grandmother   ° Hypertension Paternal Grandmother   ° Arthritis Paternal Grandfather   ° Vision loss Paternal Grandfather   ° Stroke Paternal Grandfather   ° Hyperlipidemia Paternal Grandfather   °  Anemia Sister   ° ° °Past Medical History:  °Diagnosis Date  ° Anemia   ° Anxiety   ° Back pain   ° Carpal tunnel syndrome   ° Constipation   ° Depression   ° Diabetes mellitus   ° Hypertension   ° Joint pain   ° Lactose intolerance   ° Multiple sclerosis (HCC)   ° Optic neuritis   ° Shortness of breath   ° Swallowing difficulty   ° Swelling of both lower extremities   ° Vision, loss, sudden   ° Vitamin D deficiency   ° ° °Past Surgical History:  °Procedure Laterality Date  ° BREAST REDUCTION SURGERY    ° CYST EXCISION    ° ovarian cyst removed    ° TUBAL LIGATION    ° WISDOM TOOTH EXTRACTION    ° ° °Social History:  reports that she has never smoked. She has never used smokeless tobacco. She reports that she does not drink alcohol and does not use drugs. ° °Allergies: No Known Allergies ° °(Not in a hospital admission) ° ° ° °Physical Exam: °Blood pressure (!) 143/98, pulse 93, temperature 98.2 °F (36.8 °C), temperature source Oral, resp. rate (!) 23, height 5' 6" (1.676 m), weight 127 kg, SpO2 97 %. °General: pleasant, morbidly   obese female who is laying in bed in NAD °HEENT: head is normocephalic, atraumatic.  Sclera are noninjected.  PERRL.  Ears and nose without any masses or lesions.  Mouth is pink and moist °Heart: regular, rate, and rhythm.  Normal s1,s2. No obvious murmurs, gallops, or rubs noted.  Palpable radial and pedal pulses bilaterally °Lungs: CTAB, no wheezes, rhonchi, or rales noted.  Respiratory effort nonlabored °Abd: soft, NT, morbidly obese, +BS, no masses, hernias, or organomegaly °GU: normal female anatomy.  Significant white patchy areas noted on her labia, vagina, and her inguinal creases c/w vaginal candidiasis. °MS: all 4 extremities are symmetrical with no cyanosis, clubbing, or edema. °Skin: warm and dry with no masses, lesions, or rashes.  She has significant induration of her left medial gluteus as well as erythema.  There is an areas of spontaneous bloody purulent drainage at the  superior aspect of her gluteus.  Her induration tracks all the way down to her perineum, but spares this and her labia.   °Neuro: Cranial nerves 2-12 grossly intact, sensation is normal throughout °Psych: A&Ox3 with an appropriate affect. ° ° °Results for orders placed or performed during the hospital encounter of 12/22/21 (from the past 48 hour(s))  °CBG monitoring, ED     Status: Abnormal  ° Collection Time: 12/22/21  5:53 PM  °Result Value Ref Range  ° Glucose-Capillary 533 (HH) 70 - 99 mg/dL  °  Comment: Glucose reference range applies only to samples taken after fasting for at least 8 hours.  ° Comment 1 A   °Blood Culture (routine x 2)     Status: None (Preliminary result)  ° Collection Time: 12/22/21  6:05 PM  ° Specimen: BLOOD  °Result Value Ref Range  ° Specimen Description    °  BLOOD LEFT ANTECUBITAL °Performed at [facility], [address], [place], [place] [address] °  ° Special Requests    °  Blood Culture adequate volume BOTTLES DRAWN AEROBIC AND ANAEROBIC °Performed at [facility], [address], [place], [place] [address] °  ° Culture    °  NO GROWTH < 24 HOURS °Performed at [facility], [address] [place], [place] [address] °  ° Report Status PENDING   °Lactic acid, plasma     Status: None  ° Collection Time: 12/22/21  6:23 PM  °Result Value Ref Range  ° Lactic Acid, Venous 1.9 0.5 - 1.9 mmol/L  °  Comment: Performed at [facility], [address], [place], [place] [address]  °Comprehensive metabolic panel     Status: Abnormal  ° Collection Time: 12/22/21  6:23 PM  °Result Value Ref Range  ° Sodium 131 (L) 135 - 145 mmol/L  ° Potassium 3.4 (L) 3.5 - 5.1 mmol/L  ° Chloride 91 (L) 98 - 111 mmol/L  ° CO2 25 22 - 32 mmol/L  ° Glucose, Bld 560 (HH) 70 - 99 mg/dL  °  Comment: Glucose reference range applies only to samples taken after fasting for at least 8 hours. °CRITICAL RESULT CALLED TO, READ BACK BY AND VERIFIED WITH: °A  POWELL,RN 1922 12/22/21 DBRADLEY °  ° BUN 15 6 - 20 mg/dL  ° Creatinine, Ser 1.06 (H) 0.44 - 1.00 mg/dL  ° Calcium 9.5 8.9 - 10.3 mg/dL  ° Total Protein 8.4 (H) 6.5 - 8.1 g/dL  ° Albumin 3.7 3.5 - 5.0 g/dL  ° AST 10 (L) 15 - 41 U/L  ° ALT 10 0 - 44 U/L  °   Alkaline Phosphatase 157 (H) 38 - 126 U/L  ° Total Bilirubin 0.8 0.3 - 1.2 mg/dL  ° GFR, Estimated >60 >60 mL/min  °  Comment: (NOTE) °Calculated using the CKD-EPI Creatinine Equation (2021) °  ° Anion gap 15 5 - 15  °  Comment: Performed at [facility], [address], [place], [place] [address]  °CBC WITH DIFFERENTIAL     Status: Abnormal  ° Collection Time: 12/22/21  6:23 PM  °Result Value Ref Range  ° WBC 27.2 (H) 4.0 - 10.5 K/uL  ° RBC 4.38 3.87 - 5.11 MIL/uL  ° Hemoglobin 11.6 (L) 12.0 - 15.0 g/dL  ° HCT 35.7 (L) 36.0 - 46.0 %  ° MCV 81.5 80.0 - 100.0 fL  ° MCH 26.5 26.0 - 34.0 pg  ° MCHC 32.5 30.0 - 36.0 g/dL  ° RDW 14.0 11.5 - 15.5 %  ° Platelets 444 (H) 150 - 400 K/uL  ° nRBC 0.0 0.0 - 0.2 %  ° Neutrophils Relative % 88 %  ° Neutro Abs 23.7 (H) 1.7 - 7.7 K/uL  ° Lymphocytes Relative 3 %  ° Lymphs Abs 0.8 0.7 - 4.0 K/uL  ° Monocytes Relative 7 %  ° Monocytes Absolute 2.0 (H) 0.1 - 1.0 K/uL  ° Eosinophils Relative 0 %  ° Eosinophils Absolute 0.1 0.0 - 0.5 K/uL  ° Basophils Relative 0 %  ° Basophils Absolute 0.1 0.0 - 0.1 K/uL  ° Immature Granulocytes 2 %  ° Abs Immature Granulocytes 0.56 (H) 0.00 - 0.07 K/uL  °  Comment: Performed at [facility], [address], [place], [place] [address]  °Protime-INR     Status: None  ° Collection Time: 12/22/21  6:23 PM  °Result Value Ref Range  ° Prothrombin Time 14.5 11.4 - 15.2 seconds  ° INR 1.1 0.8 - 1.2  °  Comment: (NOTE) °INR goal varies based on device and disease states. °Performed at [facility], [address], °[place], [place] [address] °  °APTT     Status: None  ° Collection Time: 12/22/21  6:23 PM  °Result Value Ref Range  ° aPTT 29 24 - 36  seconds  °  Comment: Performed at [facility], [address], [place], [place] [address]  °Blood Culture (routine x 2)     Status: None (Preliminary result)  ° Collection Time: 12/22/21  7:00 PM  ° Specimen: BLOOD  °Result Value Ref Range  ° Specimen Description    °  BLOOD BLOOD RIGHT FOREARM °Performed at [facility], [address], [place], [place] [address] °  ° Special Requests    °  Blood Culture adequate volume BOTTLES DRAWN AEROBIC AND ANAEROBIC °Performed at [facility], [address], [place], [place] [address] °  ° Culture    °  NO GROWTH < 24 HOURS °Performed at [facility], [address] [place], [place] [address] °  ° Report Status PENDING   °Resp Panel by RT-PCR (Flu A&B, Covid) Nasopharyngeal Swab     Status: None  ° Collection Time: 12/22/21  7:04 PM  ° Specimen: Nasopharyngeal Swab; Nasopharyngeal(NP) swabs in vial transport medium  °Result Value Ref Range  ° SARS Coronavirus 2 by RT PCR NEGATIVE NEGATIVE  °  Comment: (NOTE) °SARS-CoV-2 target nucleic acids are NOT DETECTED. ° °The SARS-CoV-2 RNA is generally detectable in upper respiratory °specimens during the acute phase of infection. The lowest °concentration of SARS-CoV-2 viral copies this assay can detect   is °138 copies/mL. A negative result does not preclude SARS-Cov-2 °infection and should not be used as the sole basis for treatment or °other patient management decisions. A negative result may occur with  °improper specimen collection/handling, submission of specimen other °than nasopharyngeal swab, presence of viral mutation(s) within the °areas targeted by this assay, and inadequate number of viral °copies(<138 copies/mL). A negative result must be combined with °clinical observations, patient history, and epidemiological °information. The expected result is Negative. ° °Fact Sheet for Patients:  °https://www.fda.gov/media/152166/download ° °Fact Sheet for  Healthcare Providers:  °https://www.fda.gov/media/152162/download ° °This test is no t yet approved or cleared by the [place] FDA and  °has been authorized for detection and/or diagnosis of SARS-CoV-2 by °FDA under an Emergency Use Authorization (EUA). This EUA will remain  °in effect (meaning this test can be used) for the duration of the °COVID-19 declaration under Section 564(b)(1) of the Act, 21 °U.S.C.section 360bbb-3(b)(1), unless the authorization is terminated  °or revoked sooner.  ° ° °  ° Influenza A by PCR NEGATIVE NEGATIVE  ° Influenza B by PCR NEGATIVE NEGATIVE  °  Comment: (NOTE) °The Xpert Xpress SARS-CoV-2/FLU/RSV plus assay is intended as an aid °in the diagnosis of influenza from Nasopharyngeal swab specimens and °should not be used as a sole basis for treatment. Nasal washings and °aspirates are unacceptable for Xpert Xpress SARS-CoV-2/FLU/RSV °testing. ° °Fact Sheet for Patients: °https://www.fda.gov/media/152166/download ° °Fact Sheet for Healthcare Providers: °https://www.fda.gov/media/152162/download ° °This test is not yet approved or cleared by the [place] FDA and °has been authorized for detection and/or diagnosis of SARS-CoV-2 by °FDA under an Emergency Use Authorization (EUA). This EUA will remain °in effect (meaning this test can be used) for the duration of the °COVID-19 declaration under Section 564(b)(1) of the Act, 21 U.S.C. °section 360bbb-3(b)(1), unless the authorization is terminated or °revoked. ° °Performed at [facility], [address], °[place], [place] [address] °  °CBG monitoring, ED     Status: Abnormal  ° Collection Time: 12/22/21  7:16 PM  °Result Value Ref Range  ° Glucose-Capillary 472 (H) 70 - 99 mg/dL  °  Comment: Glucose reference range applies only to samples taken after fasting for at least 8 hours.  °CBG monitoring, ED     Status: Abnormal  ° Collection Time: 12/22/21  8:04 PM  °Result Value Ref Range  ° Glucose-Capillary 389 (H)  70 - 99 mg/dL  °  Comment: Glucose reference range applies only to samples taken after fasting for at least 8 hours.  °CBG monitoring, ED     Status: Abnormal  ° Collection Time: 12/22/21  9:14 PM  °Result Value Ref Range  ° Glucose-Capillary 291 (H) 70 - 99 mg/dL  °  Comment: Glucose reference range applies only to samples taken after fasting for at least 8 hours.  °CBG monitoring, ED     Status: Abnormal  ° Collection Time: 12/22/21 10:16 PM  °Result Value Ref Range  ° Glucose-Capillary 181 (H) 70 - 99 mg/dL  °  Comment: Glucose reference range applies only to samples taken after fasting for at least 8 hours.  °CBG monitoring, ED     Status: Abnormal  ° Collection Time: 12/22/21 11:22 PM  °Result Value Ref Range  ° Glucose-Capillary 162 (H) 70 - 99 mg/dL  °  Comment: Glucose reference range applies only to samples taken after fasting for at least 8 hours.  °CBG monitoring, ED     Status: Abnormal  ° Collection Time: 12/23/21   12:26 AM  °Result Value Ref Range  ° Glucose-Capillary 187 (H) 70 - 99 mg/dL  °  Comment: Glucose reference range applies only to samples taken after fasting for at least 8 hours.  °CBG monitoring, ED     Status: Abnormal  ° Collection Time: 12/23/21  1:34 AM  °Result Value Ref Range  ° Glucose-Capillary 179 (H) 70 - 99 mg/dL  °  Comment: Glucose reference range applies only to samples taken after fasting for at least 8 hours.  °CBG monitoring, ED     Status: Abnormal  ° Collection Time: 12/23/21  2:40 AM  °Result Value Ref Range  ° Glucose-Capillary 172 (H) 70 - 99 mg/dL  °  Comment: Glucose reference range applies only to samples taken after fasting for at least 8 hours.  °CBG monitoring, ED     Status: Abnormal  ° Collection Time: 12/23/21  3:46 AM  °Result Value Ref Range  ° Glucose-Capillary 161 (H) 70 - 99 mg/dL  °  Comment: Glucose reference range applies only to samples taken after fasting for at least 8 hours.  °CBG monitoring, ED     Status: Abnormal  ° Collection Time: 12/23/21   5:50 AM  °Result Value Ref Range  ° Glucose-Capillary 163 (H) 70 - 99 mg/dL  °  Comment: Glucose reference range applies only to samples taken after fasting for at least 8 hours.  °CBG monitoring, ED     Status: Abnormal  ° Collection Time: 12/23/21  8:06 AM  °Result Value Ref Range  ° Glucose-Capillary 169 (H) 70 - 99 mg/dL  °  Comment: Glucose reference range applies only to samples taken after fasting for at least 8 hours.  °CBG monitoring, ED     Status: Abnormal  ° Collection Time: 12/23/21  9:56 AM  °Result Value Ref Range  ° Glucose-Capillary 178 (H) 70 - 99 mg/dL  °  Comment: Glucose reference range applies only to samples taken after fasting for at least 8 hours.  °CBG monitoring, ED     Status: Abnormal  ° Collection Time: 12/23/21 11:35 AM  °Result Value Ref Range  ° Glucose-Capillary 157 (H) 70 - 99 mg/dL  °  Comment: Glucose reference range applies only to samples taken after fasting for at least 8 hours.  °Basic metabolic panel     Status: Abnormal  ° Collection Time: 12/23/21 12:40 PM  °Result Value Ref Range  ° Sodium 136 135 - 145 mmol/L  ° Potassium 2.9 (L) 3.5 - 5.1 mmol/L  ° Chloride 100 98 - 111 mmol/L  ° CO2 29 22 - 32 mmol/L  ° Glucose, Bld 173 (H) 70 - 99 mg/dL  °  Comment: Glucose reference range applies only to samples taken after fasting for at least 8 hours.  ° BUN 13 6 - 20 mg/dL  ° Creatinine, Ser 0.56 0.44 - 1.00 mg/dL  ° Calcium 8.5 (L) 8.9 - 10.3 mg/dL  ° GFR, Estimated >60 >60 mL/min  °  Comment: (NOTE) °Calculated using the CKD-EPI Creatinine Equation (2021) °  ° Anion gap 7 5 - 15  °  Comment: Performed at [facility], [address], [place], [place] [address]  °CBG monitoring, ED     Status: Abnormal  ° Collection Time: 12/23/21  1:15 PM  °Result Value Ref Range  ° Glucose-Capillary 147 (H) 70 - 99 mg/dL  °  Comment: Glucose reference range applies only to samples taken after fasting for at least 8 hours.  ° Comment   1 RN AWARE   ° °CT ABDOMEN PELVIS W  CONTRAST ° °Result Date: 12/22/2021 °CLINICAL DATA:  Left lower quadrant abdominal pain. Left buttocks abscess. EXAM: CT ABDOMEN AND PELVIS WITH CONTRAST TECHNIQUE: Multidetector CT imaging of the abdomen and pelvis was performed using the standard protocol following bolus administration of intravenous contrast. CONTRAST:  100mL OMNIPAQUE IOHEXOL 300 MG/ML  SOLN COMPARISON:  None. FINDINGS: Lower chest: No acute abnormality. Hepatobiliary: There is a heterogeneously enhancing lesion in the posterior right lobe of the liver measuring 2.4 by 1.6 cm which is more homogeneous with surrounding liver on delayed imaging most compatible with hepatic hemangioma. Liver and bile ducts are within normal limits. Pancreas: Unremarkable. No pancreatic ductal dilatation or surrounding inflammatory changes. Spleen: Normal in size without focal abnormality. Adrenals/Urinary Tract: The kidneys and adrenal glands are within normal limits. There is mild bladder wall emphysema. There is a tiny amount of extraluminal gas image 2/80. Stomach/Bowel: Stomach is within normal limits. Appendix appears normal. No evidence of bowel wall thickening, distention, or inflammatory changes. There is colonic diverticulosis without evidence for acute diverticulitis. Vascular/Lymphatic: IVC and aorta are normal in size. There are enlarged left inguinal lymph nodes measuring up to 12 mm short axis. Reproductive: Uterus and bilateral adnexa are unremarkable. Other: There is no ascites or focal abdominal wall hernia. There is marked subcutaneous edema in the medial left buttocks near the gluteal cleft extending to the level of the left rectum. There are no rim enhancing fluid collections. There is no soft tissue gas in this region. Ill-defined fluid seen near the left gluteal cleft posteriorly image 2/89 measuring 4.2 x 3.6 x 3.9 cm and posterior to the left rectum/anus image 2/97 measuring 7.4 x 2.4 by 2.6 cm. Musculoskeletal: There are degenerative changes  at L4-L5. No acute fractures are seen. No osseous erosions are identified. IMPRESSION: 1. Subcutaneous fluid and stranding in the left buttock extending to the rectum. Two ill-defined fluid collections are identified in this region which may represent phlegmon or early abscess formation. No rim enhancing collections are identified. No evidence for soft tissue gas in this region. 2. Findings worrisome for emphysematous cystitis. There is also small amount of extraluminal gas near the bladder, indeterminate. Small amount of bladder perforation can not be excluded. Electronically Signed   By: Amy  Guttmann M.D.   On: 12/22/2021 20:13  ° °DG Chest Port 1 View ° °Result Date: 12/22/2021 °CLINICAL DATA:  Sepsis EXAM: PORTABLE CHEST 1 VIEW COMPARISON:  None. FINDINGS: Lungs are clear. No pneumothorax or pleural effusion. Cardiac size within normal limits. Pulmonary vascularity is normal. Osseous structures are age-appropriate. No acute bone abnormality. IMPRESSION: No active disease. Electronically Signed   By: Ashesh  Parikh M.D.   On: 12/22/2021 18:52   ° ° ° °Assessment/Plan °Left buttock abscess °Imaging, labs, and chart have been reviewed.  We will obtain a new CBC to follow her WBC.  She is already on abx therapy including Maxipime and Vancomycin.  After evaluation of her buttock, she will need I&D in the operating room to further correct this problem.  We discussed the procedure and what it entails.  We also discussed risks and complications including, but not limited to bleeding, further infection requiring multiple operations, as well as delayed wound healing secondary to her uncontrolled DM.  She understands and is agreeable to proceed.  Discussed patient and treatment plans with primary service. ° °Vaginal Candidiasis °Given her significant amount of infection as well as her uncontrolled DM, we will start   diflucan 150mg x3 days for complex infection.  We will also treat topically with nystatin powder as well.    ° °FEN - NPO/IVFs °VTE - can start to follow OR °ID - Maxipime/Vanc/Diflucan ° °DM °HTN °Anemia °Diabetic neuropathy °MS °Emphysematous cystitis ° °High Medical Decision Making ° °[place] E Raechell Singleton, PA-C °[facility] °12/23/2021, 2:09 PM °Please see Amion for pager number during day hours 7:00am-4:30pm or 7:00am -11:30am on weekends ° ° °

## 2021-12-23 NOTE — Assessment & Plan Note (Signed)
Being repleted and will follow  Checking magnesium Repeat bmp now Telemetry

## 2021-12-23 NOTE — H&P (Signed)
History and Physical    Katrina Montes ZOX:[id number] DOB: [birth year] DOA: 12/22/2021  PCP: Dr. Buelah Manis Consultants:  neurology: Dr. Krista Blue  ENT: Dr. Tacy Dura  Patient coming from: [place]  lives at home with her parents.   Chief Complaint: abscess on buttocks.   HPI: Katrina Montes is a 51 y.o. female with medical history significant of MS on ocrevus, uncontrolled type 2 diabetes, IDA, HTN, OSA, depression who presented to Ed with abscess on her left buttocks. She first noticed this about a week before New years then about a few days ago it started to drain. She states it was copious amounts of blood and pus. She states it was getting bigger and more painful. She also states she has had urinary frequency and urgency and has had some pain in her vaginal area. She denies any dysuria. She has had low grade intermittent fevers at home up to 100.1. She has not seen anyone outpatient. Denies any trauma, open wound to area. History of small skin abscesses in the past.   She denies any chest pain/palpitations, shortness of breath or cough, stomach pain, v/D. Intermittent nausea. Urinary symptoms as above. No leg swelling.     ED Course: vitals: temp: 99.2, bp: 152/60, HR: 111, RR: 18, oxygen: 99% RA Pertinent labs: wbc: 27.2, hgb: 11.6, platelets: 444, glucose: 560, potassium: 3.4, creatinine: 1.06, potassium: 2.9,  CXR: no active disease CT abdo/pelvis: Subcutaneous fluid and stranding in the left buttock extending to the rectum. Two ill-defined fluid collections are identified in this region which may represent phlegmon or early abscess formation. No rim enhancing collections are identified. No evidence for soft tissue gas in this region. Findings worrisome for emphysematous cystitis In ED: patient on cefepime, vanc, flagyl. Cultures obtained. Started on insulin drip due to hyperglycemia. No gap. Cultures obtained. General surgery consulted and plans for OR for I/D and tRH was asked to  admit.   Review of Systems: As per HPI; otherwise review of systems reviewed and negative.   Ambulatory Status:  Ambulates without assistance, but will occasionally use a cane    Past Medical History:  Diagnosis Date   Anemia    Anxiety    Back pain    Carpal tunnel syndrome    Constipation    Depression    Diabetes mellitus    Hypertension    Joint pain    Lactose intolerance    Multiple sclerosis (HCC)    Optic neuritis    Shortness of breath    Swallowing difficulty    Swelling of both lower extremities    Vision, loss, sudden    Vitamin D deficiency     Past Surgical History:  Procedure Laterality Date   BREAST REDUCTION SURGERY     CYST EXCISION     ovarian cyst removed     TUBAL LIGATION     WISDOM TOOTH EXTRACTION      Social History   Socioeconomic History   Marital status: Single    Spouse name: Not on file   Number of children: 0   Years of education: College   Highest education level: Not on file  Occupational History   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   Right handed.   Uses caffeine sparingly.   Social Determinants of Health   Financial Resource Strain: Not on file  Food Insecurity: Not on file  Transportation Needs: Not on file  Physical Activity: Not on file  Stress: Not on file  Social Connections: Not on file  Intimate Partner Violence: Not on file    No Known Allergies  Family History  Problem Relation Age of Onset   Cancer Mother    Diabetes Mother    Hearing loss Mother    Thyroid nodules Mother    Anemia Mother    Kidney disease Mother    Hypertension Mother    Obesity Mother    Hyperlipidemia Father    High blood pressure Father     Sleep apnea Father    Anemia Sister    Arthritis Maternal Grandmother    Arthritis Maternal Grandfather    Arthritis Paternal Grandmother    Kidney disease Paternal Grandmother    Hypertension Paternal Grandmother    Arthritis Paternal Grandfather    Vision loss Paternal Grandfather    Stroke Paternal Grandfather    Hyperlipidemia Paternal Grandfather    Anemia Sister     Prior to Admission medications   Medication Sig Start Date End Date Taking? Authorizing Provider  APPLE CIDER VINEGAR PO Take 1 tablet by mouth as needed.     [provider]  Blood Glucose Monitoring Suppl (BLOOD GLUCOSE SYSTEM PAK) KIT Please dispense as Guide Me System. Use as directed to monitor FSBS 3x daily. Dx: E11.9. 05/07/19   Salley Scarlet, MD  cyclobenzaprine (FLEXERIL) 10 MG tablet TAKE 1 Tablet BY MOUTH 4 TIMES DAILY AS NEEDED 10/30/16   [provider]  DiphenhydrAMINE HCl (BENADRYL ALLERGY PO) Take by mouth as needed.    [provider]  docusate sodium (COLACE) 50 MG capsule Take 1 capsule (50 mg total) by mouth 2 (two) times daily. 03/09/15   Levert Feinstein, MD  DULoxetine (CYMBALTA) 60 MG capsule Take 1 capsule (60 mg total) by mouth daily. 08/01/21   Levert Feinstein, MD  ELDERBERRY PO Take 1,150 mg by mouth daily.    [provider]  ferrous sulfate (EQL SLOW RELEASE IRON) 160 (50 Fe) MG TBCR SR tablet Take 1 tablet (160 mg total) by mouth daily. 11/26/20   Salley Scarlet, MD  Glucose Blood (BLOOD GLUCOSE TEST STRIPS) STRP Please dispense as Guide Me System. Use as directed to monitor FSBS 3x daily. Dx: E11.9. 05/07/19   Salley Scarlet, MD  HYDROcodone-acetaminophen (NORCO) 7.5-325 MG tablet Take 1 tablet by mouth every 6 (six) hours as needed for moderate pain. 01/05/20   [place], Velna Hatchet, MD  ibuprofen (ADVIL,MOTRIN) 200 MG tablet Take 200 mg by mouth every 6 (six) hours as needed.    [provider]  JANUMET 50-500 MG tablet Take 1 tablet by mouth 2 (two)  times daily. 11/01/20   Salley Scarlet, MD  Lancets MISC Please dispense as Guide Me System. Use as directed to monitor FSBS 3x daily. Dx: E11.9. 05/07/19   Salley Scarlet, MD  lisinopril-hydrochlorothiazide (ZESTORETIC) 20-12.5 MG tablet TAKE 1 TABLET BY MOUTH DAILY 12/15/19   [place], Velna Hatchet, MD  meclizine (ANTIVERT) 25 MG tablet Take 1 tablet (25 mg total) by mouth 3 (three) times daily as needed for dizziness. 07/19/20   Glean Salvo, NP  Multiple Vitamin (MULTIVITAMIN) capsule Take 1 capsule by mouth daily.  [provider]  norgestimate-ethinyl estradiol (ESTARYLLA) 0.25-35 MG-MCG tablet Take 1 tablet by mouth daily. 08/12/20   Alycia Rossetti, MD  nortriptyline (PAMELOR) 25 MG capsule Take 2 capsules (50 mg total) by mouth at bedtime. 08/01/21   Marcial Pacas, MD  ocrelizumab (OCREVUS) 300 MG/10ML injection Inject into the vein every 6 (six) months.    [provider]  rizatriptan (MAXALT) 10 MG tablet Take 1 tablet (10 mg total) by mouth as needed for migraine. May repeat in 2 hours if needed. Max dose: 2/24 hr or 15/30 days. 08/01/21   Marcial Pacas, MD  topiramate (TOPAMAX) 100 MG tablet Take 1 tablet (100 mg total) by mouth at bedtime. 08/01/21   Marcial Pacas, MD  VITAMIN D PO Take 5,000 Units by mouth daily.    [provider]    Physical Exam: Vitals:   12/23/21 1247 12/23/21 1457 12/23/21 1811 12/23/21 1816  BP:  127/73 (!) 170/97 140/72  Pulse:  96 (!) 108 (!) 106  Resp:  19 18   Temp: 98.2 F (36.8 C) 97.6 F (36.4 C)    TempSrc: Oral Oral    SpO2:  97% 98%   Weight:  129.1 kg    Height:  $Remove'5\' 6"'jspFfXR$  (1.676 m)       General:  Appears calm and comfortable and is in NAD Eyes:  PERRL, EOMI, normal lids, iris ENT:  grossly normal hearing, lips & tongue, mmm; appropriate dentition Neck:  no LAD, masses or thyromegaly; no carotid bruits Cardiovascular:  RRR, no m/r/g. No LE edema.  Respiratory:   CTA bilaterally with no wheezes/rales/rhonchi.  Normal  respiratory effort. Abdomen:  soft, NT, ND, NABS Back:   normal alignment, no CVAT Skin: left medial gluteus: induration into perineum, erythema and drainage that is bloody and purulent.    Musculoskeletal:  grossly normal tone BUE/BLE, good ROM, no bony abnormality Lower extremity:  No LE edema.  Limited foot exam with no ulcerations.  2+ distal pulses. Psychiatric:  grossly normal mood and affect, speech fluent and appropriate, AOx3 Neurologic:  CN 2-12 grossly intact, moves all extremities in coordinated fashion, sensation intact    Radiological Exams on Admission: Independently reviewed - see discussion in A/P where applicable  CT ABDOMEN PELVIS W CONTRAST  Result Date: 12/22/2021 CLINICAL DATA:  Left lower quadrant abdominal pain. Left buttocks abscess. EXAM: CT ABDOMEN AND PELVIS WITH CONTRAST TECHNIQUE: Multidetector CT imaging of the abdomen and pelvis was performed using the standard protocol following bolus administration of intravenous contrast. CONTRAST:  176mL OMNIPAQUE IOHEXOL 300 MG/ML  SOLN COMPARISON:  None. FINDINGS: Lower chest: No acute abnormality. Hepatobiliary: There is a heterogeneously enhancing lesion in the posterior right lobe of the liver measuring 2.4 by 1.6 cm which is more homogeneous with surrounding liver on delayed imaging most compatible with hepatic hemangioma. Liver and bile ducts are within normal limits. Pancreas: Unremarkable. No pancreatic ductal dilatation or surrounding inflammatory changes. Spleen: Normal in size without focal abnormality. Adrenals/Urinary Tract: The kidneys and adrenal glands are within normal limits. There is mild bladder wall emphysema. There is a tiny amount of extraluminal gas image 2/80. Stomach/Bowel: Stomach is within normal limits. Appendix appears normal. No evidence of bowel wall thickening, distention, or inflammatory changes. There is colonic diverticulosis without evidence for acute diverticulitis. Vascular/Lymphatic: IVC  and aorta are normal in size. There are enlarged left inguinal lymph nodes measuring up to 12 mm short axis. Reproductive: Uterus and bilateral adnexa are unremarkable. Other: There is no ascites  or focal abdominal wall hernia. There is marked subcutaneous edema in the medial left buttocks near the gluteal cleft extending to the level of the left rectum. There are no rim enhancing fluid collections. There is no soft tissue gas in this region. Ill-defined fluid seen near the left gluteal cleft posteriorly image 2/89 measuring 4.2 x 3.6 x 3.9 cm and posterior to the left rectum/anus image 2/97 measuring 7.4 x 2.4 by 2.6 cm. Musculoskeletal: There are degenerative changes at L4-L5. No acute fractures are seen. No osseous erosions are identified. IMPRESSION: 1. Subcutaneous fluid and stranding in the left buttock extending to the rectum. Two ill-defined fluid collections are identified in this region which may represent phlegmon or early abscess formation. No rim enhancing collections are identified. No evidence for soft tissue gas in this region. 2. Findings worrisome for emphysematous cystitis. There is also small amount of extraluminal gas near the bladder, indeterminate. Small amount of bladder perforation can not be excluded. Electronically Signed   By: Ronney Asters M.D.   On: 12/22/2021 20:13   DG Chest Port 1 View  Result Date: 12/22/2021 CLINICAL DATA:  Sepsis EXAM: PORTABLE CHEST 1 VIEW COMPARISON:  None. FINDINGS: Lungs are clear. No pneumothorax or pleural effusion. Cardiac size within normal limits. Pulmonary vascularity is normal. Osseous structures are age-appropriate. No acute bone abnormality. IMPRESSION: No active disease. Electronically Signed   By: Fidela Salisbury M.D.   On: 12/22/2021 18:52    EKG: Independently reviewed.  Sinus tachycardia with rate 110 with new PVC and borderline prolonged qt which is new; nonspecific ST changes with no evidence of acute ischemia   Labs on Admission: I have  personally reviewed the available labs and imaging studies at the time of the admission.  Pertinent labs:  wbc: 27.2,  hgb: 11.6,  platelets: 444,  glucose: 560,  potassium: 3.4>2.9 creatinine: 1.06,   Assessment/Plan * Left buttock abscess- (present on admission) 51 year old with uncontrolled type 2 diabetes and   Presenting with worsening left gluteal abscess that needs to be drained in OR Admit to telemetry General surgery has already seen and plans for OR tonight for I&D NPO/SCDs Continue with vanc/cefepime for now Consulting urology and will let surgery know if any surgical needs regarding emphysematous cystitis/possible bladder perforation.    Emphysematous cystitis- (present on admission) Checking UA/culture, on broad spectrum abx Consulting urology with possible perforation Treating vaginal yeast infection   Hypokalemia- (present on admission) Being repleted and will follow  Checking magnesium Repeat bmp now Telemetry   Type 2 diabetes mellitus with other specified complication ([place])- (present on admission) BS >500 on admission, no gap or other indications of DKA Has been off her insulin x 1 month and has infection likely driving elevated BS Blood sugars have been 170-180, still NPO Giving 10 units of levemir and will stop drip in 2 hours, will likely need this titrated up. Was on 30 units outpatient.  Frequent cbg q 1 hour x 4 Repeat a1c pending SW consult for PCP needs    Vaginal candidiasis- (present on admission) Complicated vaginal candidiasis in uncontrolled diabetic Will start with topical therapy with intravaginal clotrimazole x 7-14 days as well as topical clotrimazole over the labia BID Could also consider diflucan q 72 hours x 3 doses, but with qt prolongation will hold on this for now.   Prolonged QT interval- (present on admission) New finding on ekg and likely secondary to low potassium Checking magnesium, repleting potassium Continue  telemetry Repeat ekg in  am and avoid qt prolonging drugs Holding home effexor/pamelor   Essential hypertension- (present on admission) Well controlled, continue her lisinopril-hctz   ANEMIA-IRON DEFICIENCY- (present on admission) Continue daily iron, hgb stable   Multiple sclerosis ([place])- (present on admission) Followed by neurology with ocrevus q 6 months   OSA (obstructive sleep apnea)- (present on admission) Is supposed to be on cpap, but no using at this time. Does not have at home per her mother.   Depression- (present on admission) Continue cymbalta, holding effexor/pamelor with prolonged qt     Body mass index is 45.94 kg/m.   Level of care: Telemetry Medical DVT prophylaxis:  SCDs Code Status:  Full - confirmed with patient Family Communication: None present; I spoke with the patient's mother by telephone at the time of admission. Darrian Goodwill: [phone] Disposition Plan:  The patient is from: home  Anticipated d/c is to: home   Requires inpatient hospitalization for > 2 midnight stay for abscess requiring surgical intervention, IV antibiotics and MDM with specialists.    Patient is currently: stable Consults called: general surgery and urology: Dr. Louis Meckel  Admission status:  inpatient    Orma Flaming MD [facility]   How to contact the [facility] Attending or Consulting provider [place] or covering provider during after hours [place], for this patient?  Check the care team in [facility] and look for a) attending/consulting [facility] provider listed and b) the [facility] team listed Log into [email or web address] and use [place]'s universal password to access. If you do not have the password, please contact the hospital operator. Locate the [facility] provider you are looking for under [facility] and page to a number that you can be directly reached. If you still have difficulty reaching the provider, please page the [facility] (Director on Call) for the Hospitalists listed on amion for  assistance.   12/23/2021, 8:53 PM

## 2021-12-23 NOTE — Anesthesia Procedure Notes (Signed)
Procedure Name: Intubation Date/Time: 12/23/2021 8:00 PM Performed by: Valetta Fuller, CRNA Pre-anesthesia Checklist: Patient identified, Emergency Drugs available, Suction available and Patient being monitored Patient Re-evaluated:Patient Re-evaluated prior to induction Oxygen Delivery Method: Circle system utilized Preoxygenation: Pre-oxygenation with 100% oxygen Induction Type: IV induction Ventilation: Mask ventilation without difficulty Laryngoscope Size: Miller and 2 Grade View: Grade I Tube size: 7.0 mm Number of attempts: 1 Airway Equipment and Method: Stylet Placement Confirmation: ETT inserted through vocal cords under direct vision, positive ETCO2 and breath sounds checked- equal and bilateral Secured at: 22 cm Tube secured with: Tape Dental Injury: Teeth and Oropharynx as per pre-operative assessment

## 2021-12-24 DIAGNOSIS — G4733 Obstructive sleep apnea (adult) (pediatric): Secondary | ICD-10-CM | POA: Diagnosis not present

## 2021-12-24 DIAGNOSIS — L0231 Cutaneous abscess of buttock: Secondary | ICD-10-CM | POA: Diagnosis not present

## 2021-12-24 DIAGNOSIS — I10 Essential (primary) hypertension: Secondary | ICD-10-CM | POA: Diagnosis not present

## 2021-12-24 DIAGNOSIS — F3289 Other specified depressive episodes: Secondary | ICD-10-CM | POA: Diagnosis not present

## 2021-12-24 LAB — BASIC METABOLIC PANEL
Anion gap: 10 (ref 5–15)
BUN: 11 mg/dL (ref 6–20)
CO2: 23 mmol/L (ref 22–32)
Calcium: 8.4 mg/dL — ABNORMAL LOW (ref 8.9–10.3)
Chloride: 101 mmol/L (ref 98–111)
Creatinine, Ser: 0.59 mg/dL (ref 0.44–1.00)
GFR, Estimated: >60 mL/min (ref >60)
Glucose, Bld: 244 mg/dL — ABNORMAL HIGH (ref 70–99)
Potassium: 3.3 mmol/L — ABNORMAL LOW (ref 3.5–5.1)
Sodium: 134 mmol/L — ABNORMAL LOW (ref 135–145)

## 2021-12-24 LAB — GLUCOSE, CAPILLARY
Glucose-Capillary: 239 mg/dL — ABNORMAL HIGH (ref 70–99)
Glucose-Capillary: 238 mg/dL — ABNORMAL HIGH (ref 70–99)
Glucose-Capillary: 219 mg/dL — ABNORMAL HIGH (ref 70–99)
Glucose-Capillary: 144 mg/dL — ABNORMAL HIGH (ref 70–99)

## 2021-12-24 LAB — CBC
HCT: 30.7 % — ABNORMAL LOW (ref 36.0–46.0)
Hemoglobin: 9.7 g/dL — ABNORMAL LOW (ref 12.0–15.0)
MCH: 26.1 pg (ref 26.0–34.0)
MCHC: 31.6 g/dL (ref 30.0–36.0)
MCV: 82.5 fL (ref 80.0–100.0)
Platelets: 363 10*3/uL (ref 150–400)
RBC: 3.72 MIL/uL — ABNORMAL LOW (ref 3.87–5.11)
RDW: 14 % (ref 11.5–15.5)
WBC: 25.6 10*3/uL — ABNORMAL HIGH (ref 4.0–10.5)
nRBC: 0 % (ref 0.0–0.2)

## 2021-12-24 LAB — HIV ANTIBODY (ROUTINE TESTING W REFLEX): HIV Screen 4th Generation wRfx: NONREACTIVE

## 2021-12-24 MED ORDER — FLUCONAZOLE 100MG IVPB
100.0000 mg | INTRAVENOUS | Status: DC
  Administered 2021-12-24 – 2021-12-26 (×3): 100 mg via INTRAVENOUS
  Filled 2021-12-24 (×5): qty 50

## 2021-12-24 MED ORDER — ENOXAPARIN SODIUM 60 MG/0.6ML IJ SOSY
60.0000 mg | PREFILLED_SYRINGE | INTRAMUSCULAR | Status: DC
  Administered 2021-12-24 – 2021-12-30 (×7): 60 mg via SUBCUTANEOUS
  Filled 2021-12-24 (×7): qty 0.6

## 2021-12-24 MED ORDER — INSULIN DETEMIR 100 UNIT/ML ~~LOC~~ SOLN
18.0000 [IU] | Freq: Every day | SUBCUTANEOUS | Status: DC
  Administered 2021-12-24: 18 [IU] via SUBCUTANEOUS
  Filled 2021-12-24 (×3): qty 0.18

## 2021-12-24 MED ORDER — POTASSIUM CHLORIDE CRYS ER 20 MEQ PO TBCR
40.0000 meq | EXTENDED_RELEASE_TABLET | ORAL | Status: AC
  Administered 2021-12-24 (×2): 40 meq via ORAL
  Filled 2021-12-24 (×2): qty 2

## 2021-12-24 MED ORDER — INSULIN ASPART 100 UNIT/ML IJ SOLN
4.0000 [IU] | Freq: Three times a day (TID) | INTRAMUSCULAR | Status: DC
  Administered 2021-12-24 (×3): 4 [IU] via SUBCUTANEOUS

## 2021-12-24 MED ORDER — CHLORHEXIDINE GLUCONATE CLOTH 2 % EX PADS
6.0000 | MEDICATED_PAD | Freq: Every day | CUTANEOUS | Status: DC
  Administered 2021-12-24 – 2021-12-30 (×5): 6 via TOPICAL

## 2021-12-24 NOTE — Progress Notes (Signed)
Inpatient Diabetes Program Recommendations  AACE/ADA: New Consensus Statement on Inpatient Glycemic Control (2015)  Target Ranges:  Prepandial:   less than 140 mg/dL      Peak postprandial:   less than 180 mg/dL (1-2 hours)      Critically ill patients:  140 - 180 mg/dL   Lab Results  Component Value Date   GLUCAP 239 (H) 12/24/2021   HGBA1C 12.4 (H) 12/23/2021    Review of Glycemic Control  Diabetes history: DM 2 Outpatient Diabetes medications: Janumet 50-500 mg bid (off insulin x1 month due to cost) Current orders for Inpatient glycemic control:  Levemir 18 units Daily Novolog 0-15 units tid Novolog 4 units tid meal coverage  Insurance [employer] PCP: none listed noted pt recently on Basaglar 25-35 units QHS (not covered under insurance)  A1c 12.4% on 1/6  Inpatient Diabetes Program Recommendations:    Agree with insulin changes this am.   D/c supplies needed: -   Lantus solostar insulin pen order [phone] (seems to be covered under insurance along with Levemir order # [phone] and Tyler Aas order #[id number]) -   Insulin pen needles order # [id number] -   Glucose meter kit order # [id number] -   Glucaphage 500 mg (extended release version if possible) bid order #[id number] if not covered do regular metformin Follow up with new PCP  Spoke with pt over the phone. Pt reports insurance stopped covering [place] 1/2 through the year but pt still had refills until 1 month ago. Her PCP also left the practice and the other physicians were not accepting new pts.   Pt is currently waiting on call back from a few practices to see if they are accepting new pts. Discussed back up insulins at [facility] and placed price list in AVS. Pt will need benefits check ran against the insulins and oral med to make sure she can afford at time of d/c. Discussed A1c results of 12.4%. Pt was in social situation that could not be helped without knowing resources to manage her glucose levels. Pt reports needing a new glucometer  at home.   Benefits check (consult placed may not be completed until Monday due to staffing) -  Glucaphage extended release 500 mg bid -  Januvia 100 mg Daily -  Janumet 50-500 mg bid -  Lantus solostar insulin pen 30 units Daily -  Levemir flexpen insulin pen 30 units Daily -  Tresiba pen 30 units Daily   Thanks,  Tama Headings RN, MSN, BC-ADM Inpatient Diabetes Coordinator Team Pager [phone] (8a-5p)

## 2021-12-24 NOTE — Progress Notes (Signed)
1 Day Post-Op   Chief Complaint/Subjective: Some pain in buttock, no nausea  Review of Systems See above, otherwise other systems negative   PMH -  has a past medical history of Anemia, Anxiety, Back pain, Carpal tunnel syndrome, Constipation, Depression, Diabetes mellitus, Hypertension, Joint pain, Lactose intolerance, Multiple sclerosis (HCC), Optic neuritis, Shortness of breath, Swallowing difficulty, Swelling of both lower extremities, Vision, loss, sudden, and Vitamin D deficiency. PSH -  has a past surgical history that includes Cyst excision; Breast reduction surgery; Wisdom tooth extraction; ovarian cyst removed; and Tubal ligation.  [facility] - family history includes Anemia in her mother, sister, and sister; Arthritis in her maternal grandfather, maternal grandmother, paternal grandfather, and paternal grandmother; Cancer in her mother; Diabetes in her mother; Hearing loss in her mother; High blood pressure in her father; Hyperlipidemia in her father and paternal grandfather; Hypertension in her mother and paternal grandmother; Kidney disease in her mother and paternal grandmother; Obesity in her mother; Sleep apnea in her father; Stroke in her paternal grandfather; Thyroid nodules in her mother; Vision loss in her paternal grandfather.   Objective: Vital signs in last 24 hours: Temp:  [97.6 F (36.4 C)-98.5 F (36.9 C)] 97.7 F (36.5 C) (01/07 0742) Pulse Rate:  [93-108] 95 (01/07 0742) Resp:  [15-28] 24 (01/07 0742) BP: (107-170)/(59-98) 111/77 (01/07 0742) SpO2:  [93 %-99 %] 98 % (01/07 0742) Weight:  [129.1 kg] 129.1 kg (01/06 1457) Last BM Date: 12/22/21 Intake/Output from previous day: 01/06 0701 - 01/07 0700 In: 3509.1 [I.V.:3509.1] Out: 700 [Urine:700] Intake/Output this shift: Total I/O In: -  Out: 650 [Urine:650]  PE: Gen: NAd Resp: nonlabored Card: RRR GU: penrose in place, edema along medial buttock, purulent drainage from wouds  Lab Results:  Recent Labs     12/23/21 2240 12/24/21 0126  WBC 25.9* 25.6*  HGB 9.6* 9.7*  HCT 29.4* 30.7*  PLT 354 363   BMET Recent Labs    12/23/21 1739 12/23/21 1825 12/24/21 0126  NA 135 135 134*  K 3.0* 4.0 3.3*  CL 101 103 101  CO2 26  --  23  GLUCOSE 172* 173* 244*  BUN 9 15 11   CREATININE 0.52 0.30* 0.59  CALCIUM 8.3*  --  8.4*   PT/INR Recent Labs    12/22/21 1823  LABPROT 14.5  INR 1.1   CMP     Component Value Date/Time   NA 134 (L) 12/24/2021 0126   NA 137 07/19/2020 0813   K 3.3 (L) 12/24/2021 0126   CL 101 12/24/2021 0126   CO2 23 12/24/2021 0126   GLUCOSE 244 (H) 12/24/2021 0126   BUN 11 12/24/2021 0126   BUN 12 07/19/2020 0813   CREATININE 0.59 12/24/2021 0126   CREATININE 0.67 11/19/2020 0931   CALCIUM 8.4 (L) 12/24/2021 0126   PROT 8.4 (H) 12/22/2021 1823   PROT 7.2 07/19/2020 0813   ALBUMIN 3.7 12/22/2021 1823   ALBUMIN 3.8 07/19/2020 0813   AST 10 (L) 12/22/2021 1823   ALT 10 12/22/2021 1823   ALKPHOS 157 (H) 12/22/2021 1823   BILITOT 0.8 12/22/2021 1823   BILITOT 0.4 07/19/2020 0813   GFRNONAA >60 12/24/2021 0126   GFRAA 120 07/19/2020 0813   Lipase  No results found for: LIPASE  Studies/Results: CT ABDOMEN PELVIS W CONTRAST  Result Date: 12/22/2021 CLINICAL DATA:  Left lower quadrant abdominal pain. Left buttocks abscess. EXAM: CT ABDOMEN AND PELVIS WITH CONTRAST TECHNIQUE: Multidetector CT imaging of the abdomen and pelvis was performed using the  standard protocol following bolus administration of intravenous contrast. CONTRAST:  OMNIPAQUE IOHEXOL 300 MG/ML  SOLN COMPARISON:  None. FINDINGS: Lower chest: No acute abnormality. Hepatobiliary: There is a heterogeneously enhancing lesion in the posterior right lobe of the liver measuring 2.4 by 1.6 cm which is more homogeneous with surrounding liver on delayed imaging most compatible with hepatic hemangioma. Liver and bile ducts are within normal limits. Pancreas: Unremarkable. No pancreatic ductal dilatation  or surrounding inflammatory changes. Spleen: Normal in size without focal abnormality. Adrenals/Urinary Tract: The kidneys and adrenal glands are within normal limits. There is mild bladder wall emphysema. There is a tiny amount of extraluminal gas image 2/80. Stomach/Bowel: Stomach is within normal limits. Appendix appears normal. No evidence of bowel wall thickening, distention, or inflammatory changes. There is colonic diverticulosis without evidence for acute diverticulitis. Vascular/Lymphatic: IVC and aorta are normal in size. There are enlarged left inguinal lymph nodes measuring up to 12 mm short axis. Reproductive: Uterus and bilateral adnexa are unremarkable. Other: There is no ascites or focal abdominal wall hernia. There is marked subcutaneous edema in the medial left buttocks near the gluteal cleft extending to the level of the left rectum. There are no rim enhancing fluid collections. There is no soft tissue gas in this region. Ill-defined fluid seen near the left gluteal cleft posteriorly image 2/89 measuring 4.2 x 3.6 x 3.9 cm and posterior to the left rectum/anus image 2/97 measuring 7.4 x 2.4 by 2.6 cm. Musculoskeletal: There are degenerative changes at L4-L5. No acute fractures are seen. No osseous erosions are identified. IMPRESSION: 1. Subcutaneous fluid and stranding in the left buttock extending to the rectum. Two ill-defined fluid collections are identified in this region which may represent phlegmon or early abscess formation. No rim enhancing collections are identified. No evidence for soft tissue gas in this region. 2. Findings worrisome for emphysematous cystitis. There is also small amount of extraluminal gas near the bladder, indeterminate. Small amount of bladder perforation can not be excluded. Electronically Signed   By: Darliss Cheney M.D.   On: 12/22/2021 20:13   DG Chest Port 1 View  Result Date: 12/22/2021 CLINICAL DATA:  Sepsis EXAM: PORTABLE CHEST 1 VIEW COMPARISON:  None.  FINDINGS: Lungs are clear. No pneumothorax or pleural effusion. Cardiac size within normal limits. Pulmonary vascularity is normal. Osseous structures are age-appropriate. No acute bone abnormality. IMPRESSION: No active disease. Electronically Signed   By: Helyn Numbers M.D.   On: 12/22/2021 18:52    Anti-infectives: Anti-infectives (From admission, onward)    Start     Dose/Rate Route Frequency Ordered Stop   12/24/21 0100  vancomycin (VANCOREADY) IVPB 1750 mg/350 mL        1,750 mg 175 mL/hr over 120 Minutes Intravenous Every 24 hours 12/23/21 2358     12/23/21 2130  vancomycin (VANCOREADY) IVPB 1750 mg/350 mL  Status:  Discontinued        1,750 mg 175 mL/hr over 120 Minutes Intravenous Every 24 hours 12/22/21 2009 12/23/21 2358   12/23/21 1959  metroNIDAZOLE (FLAGYL) 500 MG/100ML IVPB       Note to Pharmacy: Shanda Bumps M: cabinet override      12/23/21 1959 12/23/21 2019   12/23/21 1845  metroNIDAZOLE (FLAGYL) IVPB 500 mg        500 mg 100 mL/hr over 60 Minutes Intravenous  Once 12/23/21 1844 12/23/21 2018   12/23/21 1800  fluconazole (DIFLUCAN) tablet 150 mg  Status:  Discontinued  150 mg Oral Daily 12/23/21 1522 12/23/21 2032   12/23/21 0400  ceFEPIme (MAXIPIME) 2 g in sodium chloride 0.9 % 100 mL IVPB        2 g 200 mL/hr over 30 Minutes Intravenous Every 8 hours 12/22/21 2223     12/22/21 2130  vancomycin (VANCOREADY) IVPB 500 mg/100 mL  Status:  Discontinued       See Hyperspace for full Linked Orders Report.   500 mg 100 mL/hr over 60 Minutes Intravenous  Once 12/22/21 2008 12/22/21 2026   12/22/21 2030  vancomycin (VANCOCIN) IVPB 1000 mg/200 mL premix  Status:  Discontinued       See Hyperspace for full Linked Orders Report.   1,000 mg 200 mL/hr over 60 Minutes Intravenous  Once 12/22/21 1915 12/22/21 2008   12/22/21 2030  vancomycin (VANCOREADY) IVPB 500 mg/100 mL  Status:  Discontinued       See Hyperspace for full Linked Orders Report.   500 mg 100 mL/hr  over 60 Minutes Intravenous  Once 12/22/21 1915 12/22/21 2008   12/22/21 2030  vancomycin (VANCOCIN) IVPB 1000 mg/200 mL premix       See Hyperspace for full Linked Orders Report.   1,000 mg 200 mL/hr over 60 Minutes Intravenous  Once 12/22/21 2008 12/22/21 2237   12/22/21 2030  vancomycin (VANCOCIN) 500 mg in sodium chloride 0.9 % 500 mL IVPB        500 mg 250 mL/hr over 120 Minutes Intravenous  Once 12/22/21 2024 12/23/21 0057   12/22/21 2030  vancomycin (VANCOCIN) 500 MG powder       Note to Pharmacy: Ivonne Andrew A: cabinet override      12/22/21 2030 12/23/21 0844   12/22/21 1915  vancomycin (VANCOCIN) 2,500 mg in sodium chloride 0.9 % 500 mL IVPB  Status:  Discontinued        2,500 mg 250 mL/hr over 120 Minutes Intravenous  Once 12/22/21 1910 12/22/21 1913   12/22/21 1915  vancomycin (VANCOCIN) IVPB 1000 mg/200 mL premix       See Hyperspace for full Linked Orders Report.   1,000 mg 200 mL/hr over 60 Minutes Intravenous  Once 12/22/21 1915 12/22/21 2131   12/22/21 1830  ceFEPIme (MAXIPIME) 2 g in sodium chloride 0.9 % 100 mL IVPB        2 g 200 mL/hr over 30 Minutes Intravenous  Once 12/22/21 1824 12/22/21 2004   12/22/21 1830  metroNIDAZOLE (FLAGYL) IVPB 500 mg        500 mg 100 mL/hr over 60 Minutes Intravenous  Once 12/22/21 1824 12/22/21 2045       Assessment/Plan  s/p Procedure(s): INCISION AND DRAINAGE BUTTOCK ABSCESS 12/23/2021    FEN - carb mod diet VTE - scds, surgery is ok with chemical prophylaxis ID -[id number] cefepime 1/6 >>  Disposition - inpatient for antibiotics, diabetes control, and wound education   LOS: 1 day   I reviewed last 24 h vitals and pain scores, last 48 h intake and output, last 24 h labs and trends, and last 24 h imaging results.  This care required low level of medical decision making.   [facility] 12/24/2021, 10:15 AM Please see Amion for pager number during day hours 7:00am-4:30pm or 7:00am -11:30am on  weekends

## 2021-12-24 NOTE — Discharge Instructions (Signed)
Glucose Products:  ReliOn glucose products raise low blood sugar fast. Tablets are free of fat, caffeine, sodium and gluten. They are portable and easy to carry, making it easier for people with diabetes to BE PREPARED for lows.  Glucose Tablets Available in 6 flavors  10 ct...................................... $1.00  50 ct...................................... $3.98 Glucose Shot..................................$1.48 Glucose Gel....................................$3.44  Alcohol Swabs Alcohol swabs are used to sterilize your injection site. All of our swabs are individually wrapped for maximum safety, convenience and moisture retention. ReliOn Alcohol Swabs  100 ct Swabs..............................$1.00  400 ct Swabs..............................$3.74  Lancets ReliOn offers three lancet options conveniently designed to work with almost every lancing device. Each features a protective disk, which guarantees sterility before testing. ReliOn Lancets  100 ct Lancets $1.56  200 ct Lancets $2.64 Available in Ultra-Thin, Thin & Micro-Thin ReliOn 2-IN-1 Lancing Device  50 ct Lancets..................................... $3.44 Available in 30 gauge and 25 gauge ReliOn Lancing Device....................$5.84  Blood Glucose Monitors ReliOn offers a full range of blood glucose testing options to provide an accurate, affordable system that meets each person's unique needs and preferences. Prime Meter....................................... $9.00 Prime Test Strips  25 test strips.................................... $5.00  50 test strips.................................... $9.00  100 test strips.................................$17.88 Premier [employer] Meter    $18.98 Premier Voice Meter  .  $14.98 Premier Test Strips  50 test strips.................................... $9.00  100 test strips.................................$17.88 Premier [employer] Kit     $19.44 Kit includes:  50 test strips  10 lancets  Lancing device  Carry case  Ketone Test Strips  50 test strips  ..  $6.64  Human Insulin  Novolin/ReliOn (recombinant DNA origin) is manufactured for [employer] by [employer] Insulin* with Vial..........$24.88 Available in N, R, 70/30 (see below for specifics on these 3 types of insulin) Novolin/ReliOn Insulin Pens*    $42.88 Available in N (NPH insulin like long acting insulin in place of Lantus or Levemir split dose in 1/2 and take twice a day in the morning and at bedtime), R (regular insulin like short acting insulin such as Novolog or Humalog), 70/30 (combination of both long acting insulin, in the 70 portion, and short acting insulin, in the 30 portion)  Insulin Delivery ReliOn syringes and pen needles provide precision technology, comfort and accuracy in insulin delivery at affordable prices. ReliOn Pen Needles*  50 ct....................................................$9.00 Available in 63mm, 61mm, 53mm & 32mm ReliOn Insulin Syringes*  100 ct . $12.58 Available in 29G, 30G & 31G (3/10cc, 1/2cc & 1cc units)

## 2021-12-24 NOTE — Progress Notes (Signed)
Received from OR via stretcher to rm 16 accompanied by female family member staying the night, awake alert. Tele shows sinus tachycardia. No pain reported. Dressing to buttocks d/I with abd pads and netted panty

## 2021-12-24 NOTE — Progress Notes (Addendum)
PROGRESS NOTE        PATIENT DETAILS Name: Sevannah Madia Age: 51 y.o. Sex: female Date of Birth: Mar 01, 1971 Admit Date: 12/22/2021 Admitting Physician Carollee Herter, DO PCP:Pcp, No  Brief Narrative: Patient is a 51 y.o. female with history of poorly controlled poorly controlled DM-2, HTN, MS-presented with left gluteal abscess-and hyperglycemic hyperosmolar state-started on broad-spectrum antibiotics/IV insulin-evaluated by general surgery and underwent I&D.  Patient was subsequently admitted to the hospitalist service.  See below for further details.  Subjective: Lying comfortably in bed-denies any chest pain or shortness of breath.  Objective: Vitals: Blood pressure 111/77, pulse 95, temperature 97.7 F (36.5 C), temperature source Oral, resp. rate (!) 24, height 5\' 6"  (1.676 m), weight 129.1 kg, last menstrual period 12/12/2021, SpO2 98 %.   Exam: Gen Exam:Alert awake-not in any distress HEENT:atraumatic, normocephalic Chest: B/L clear to auscultation anteriorly CVS:S1S2 regular Abdomen:soft non tender, non distended Extremities:no edema Neurology: Non focal Skin: no rash  Pertinent Labs/Radiology: Recent Labs  Lab 12/22/21 1823 12/23/21 1240 12/24/21 0126  WBC 27.2*   < > 25.6*  HGB 11.6*   < > 9.7*  PLT 444*   < > 363  NA 131*   < > 134*  K 3.4*   < > 3.3*  CREATININE 1.06*   < > 0.59  AST 10*  --   --   ALT 10  --   --   ALKPHOS 157*  --   --   BILITOT 0.8  --   --    < > = values in this interval not displayed.    Assessment/Plan: Left gluteal abscess: S/p I&D on 1/6-continue IV vancomycin-General surgery following.  Follow wound cultures  Emphysematous cystitis: Continue IV cefepime-await urine culture-blood cultures negative so far.  Still has significant leukocytosis.  Per urology-no clinical evidence of bladder perforation.  Will remove Foley catheter in the next 3-4 days as recommended by urology.  Non ketotic  Hyperosmolar hyperglycemic state: No longer on IV insulin-transitioned to SQ insulin  DM-2 uncontrolled (A1c 12.4 on 1/6): Per patient/sister at bedside-A1c has been easily more than 10 for the past several years.  Apparently takes oral hypoglycemic agents and Basaglar 30 units daily-however for the past several months has been noncompliant to insulin.  Will increase Levemir to 18 units-add fallings of NovoLog with meals and follow.  Recent Labs    12/23/21 2104 12/23/21 2206 12/24/21 0742  GLUCAP 173* 210* 239*   Perineal/vaginal candidiasis: Continue topical antifungals-since QTC has improved-we will start Diflucan-as she has emphysematous cystitis as well.  .  Prolonged QTC: Likely due to hypokalemia-resolved on today's EKG.  Watch closely   Hypokalemia: Replete and recheck  HTN: BP stable-continue lisinopril-hold HCTZ for now.  History of multiple sclerosis: Stable-on ocrelizumab injection every 6 months.  Mood disorder: Stable-continue Cymbalta  History of migraine headaches: Continue Topamax.  OSA: CPAP nightly  Morbid Obesity Estimated body mass index is 45.94 kg/m as calculated from the following:   Height as of this encounter: 5\' 6"  (1.676 m).   Weight as of this encounter: 129.1 kg.   Procedures: 1/6>> I&D of left gluteal abscess. Consults: General surgery DVT Prophylaxis: Lovenox Code Status:Full code  Family Communication:Sister at bedside.  Time spent: 35 minutes-Greater than 50% of this time was spent in counseling, explanation of diagnosis, planning of further management, and coordination of  care.   Disposition Plan: Status is: Inpatient  Remains inpatient appropriate because: Gluteal abscess-s/p I&D on 1/6-emphysematous cystitis-significant leukocytosis persists-not yet stable for discharge needs continued IV antimicrobial therapy.        Diet: Diet Order             Diet Carb Modified Fluid consistency: Thin; Room service appropriate? Yes   Diet effective now                     Antimicrobial agents: Anti-infectives (From admission, onward)    Start     Dose/Rate Route Frequency Ordered Stop   12/24/21 0100  vancomycin (VANCOREADY) IVPB 1750 mg/350 mL        1,750 mg 175 mL/hr over 120 Minutes Intravenous Every 24 hours 12/23/21 2358     12/23/21 2130  vancomycin (VANCOREADY) IVPB 1750 mg/350 mL  Status:  Discontinued        1,750 mg 175 mL/hr over 120 Minutes Intravenous Every 24 hours 12/22/21 2009 12/23/21 2358   12/23/21 1959  metroNIDAZOLE (FLAGYL) 500 MG/100ML IVPB       Note to Pharmacy: Shanda Bumps M: cabinet override      12/23/21 1959 12/23/21 2019   12/23/21 1845  metroNIDAZOLE (FLAGYL) IVPB 500 mg        500 mg 100 mL/hr over 60 Minutes Intravenous  Once 12/23/21 1844 12/23/21 2018   12/23/21 1800  fluconazole (DIFLUCAN) tablet 150 mg  Status:  Discontinued        150 mg Oral Daily 12/23/21 1522 12/23/21 2032   12/23/21 0400  ceFEPIme (MAXIPIME) 2 g in sodium chloride 0.9 % 100 mL IVPB        2 g 200 mL/hr over 30 Minutes Intravenous Every 8 hours 12/22/21 2223     12/22/21 2130  vancomycin (VANCOREADY) IVPB 500 mg/100 mL  Status:  Discontinued       See Hyperspace for full Linked Orders Report.   500 mg 100 mL/hr over 60 Minutes Intravenous  Once 12/22/21 2008 12/22/21 2026   12/22/21 2030  vancomycin (VANCOCIN) IVPB 1000 mg/200 mL premix  Status:  Discontinued       See Hyperspace for full Linked Orders Report.   1,000 mg 200 mL/hr over 60 Minutes Intravenous  Once 12/22/21 1915 12/22/21 2008   12/22/21 2030  vancomycin (VANCOREADY) IVPB 500 mg/100 mL  Status:  Discontinued       See Hyperspace for full Linked Orders Report.   500 mg 100 mL/hr over 60 Minutes Intravenous  Once 12/22/21 1915 12/22/21 2008   12/22/21 2030  vancomycin (VANCOCIN) IVPB 1000 mg/200 mL premix       See Hyperspace for full Linked Orders Report.   1,000 mg 200 mL/hr over 60 Minutes Intravenous  Once 12/22/21 2008  12/22/21 2237   12/22/21 2030  vancomycin (VANCOCIN) 500 mg in sodium chloride 0.9 % 500 mL IVPB        500 mg 250 mL/hr over 120 Minutes Intravenous  Once 12/22/21 2024 12/23/21 0057   12/22/21 2030  vancomycin (VANCOCIN) 500 MG powder       Note to Pharmacy: Ivonne Andrew A: cabinet override      12/22/21 2030 12/23/21 0844   12/22/21 1915  vancomycin (VANCOCIN) 2,500 mg in sodium chloride 0.9 % 500 mL IVPB  Status:  Discontinued        2,500 mg 250 mL/hr over 120 Minutes Intravenous  Once 12/22/21 1910 12/22/21 1913   12/22/21 1915  vancomycin (VANCOCIN) IVPB 1000 mg/200 mL premix       See Hyperspace for full Linked Orders Report.   1,000 mg 200 mL/hr over 60 Minutes Intravenous  Once 12/22/21 1915 12/22/21 2131   12/22/21 1830  ceFEPIme (MAXIPIME) 2 g in sodium chloride 0.9 % 100 mL IVPB        2 g 200 mL/hr over 30 Minutes Intravenous  Once 12/22/21 1824 12/22/21 2004   12/22/21 1830  metroNIDAZOLE (FLAGYL) IVPB 500 mg        500 mg 100 mL/hr over 60 Minutes Intravenous  Once 12/22/21 1824 12/22/21 2045        MEDICATIONS: Scheduled Meds:  Chlorhexidine Gluconate Cloth  6 each Topical Daily   clotrimazole  1 Applicatorful Vaginal QHS   clotrimazole   Topical BID   docusate sodium  50 mg Oral BID   DULoxetine  60 mg Oral Daily   ferrous sulfate  325 mg Oral Q breakfast   insulin aspart  0-15 Units Subcutaneous TID WC   insulin aspart  4 Units Subcutaneous TID WC   insulin detemir  18 Units Subcutaneous Daily   lisinopril  20 mg Oral Daily   mouth rinse  15 mL Mouth Rinse Once   multivitamin with minerals  1 tablet Oral Daily   mupirocin ointment  1 application Nasal BID   norgestimate-ethinyl estradiol  1 tablet Oral Daily   potassium chloride  40 mEq Oral Q4H   topiramate  100 mg Oral QHS   Continuous Infusions:  ceFEPime (MAXIPIME) IV 2 g (12/24/21 0353)   vancomycin 1,750 mg (12/24/21 0022)   PRN Meds:.acetaminophen **OR** acetaminophen, cyclobenzaprine,  dextrose, HYDROmorphone (DILAUDID) injection, lip balm, oxyCODONE   I have personally reviewed following labs and imaging studies  LABORATORY DATA: CBC: Recent Labs  Lab 12/22/21 1823 12/23/21 1825 12/23/21 2240 12/24/21 0126  WBC 27.2*  --  25.9* 25.6*  NEUTROABS 23.7*  --   --   --   HGB 11.6* 12.2 9.6* 9.7*  HCT 35.7* 36.0 29.4* 30.7*  MCV 81.5  --  82.4 82.5  PLT 444*  --  354 363    Basic Metabolic Panel: Recent Labs  Lab 12/22/21 1823 12/23/21 1240 12/23/21 1739 12/23/21 1825 12/23/21 2240 12/24/21 0126  NA 131* 136 135 135  --  134*  K 3.4* 2.9* 3.0* 4.0  --  3.3*  CL 91* 100 101 103  --  101  CO2 25 29 26   --   --  23  GLUCOSE 560* 173* 172* 173*  --  244*  BUN 15 13 9 15   --  11  CREATININE 1.06* 0.56 0.52 0.30*  --  0.59  CALCIUM 9.5 8.5* 8.3*  --   --  8.4*  MG  --   --   --   --  1.6*  --     GFR: Estimated Creatinine Clearance: 117.1 mL/min (by C-G formula based on SCr of 0.59 mg/dL).  Liver Function Tests: Recent Labs  Lab 12/22/21 1823  AST 10*  ALT 10  ALKPHOS 157*  BILITOT 0.8  PROT 8.4*  ALBUMIN 3.7   No results for input(s): LIPASE, AMYLASE in the last 168 hours. No results for input(s): AMMONIA in the last 168 hours.  Coagulation Profile: Recent Labs  Lab 12/22/21 1823  INR 1.1    Cardiac Enzymes: No results for input(s): CKTOTAL, CKMB, CKMBINDEX, TROPONINI in the last 168 hours.  BNP (last 3 results) No results for input(s): PROBNP  in the last 8760 hours.  Lipid Profile: No results for input(s): CHOL, HDL, LDLCALC, TRIG, CHOLHDL, LDLDIRECT in the last 72 hours.  Thyroid Function Tests: No results for input(s): TSH, T4TOTAL, FREET4, T3FREE, THYROIDAB in the last 72 hours.  Anemia Panel: No results for input(s): VITAMINB12, FOLATE, FERRITIN, TIBC, IRON, RETICCTPCT in the last 72 hours.  Urine analysis:    Component Value Date/Time   COLORURINE AMBER (A) 12/23/2021 1833   APPEARANCEUR HAZY (A) 12/23/2021 1833    LABSPEC 1.015 12/23/2021 1833   PHURINE 5.5 12/23/2021 1833   GLUCOSEU >=500 (A) 12/23/2021 1833   HGBUR LARGE (A) 12/23/2021 1833   HGBUR negative 10/24/2010 0821   BILIRUBINUR SMALL (A) 12/23/2021 1833   KETONESUR NEGATIVE 12/23/2021 1833   PROTEINUR 30 (A) 12/23/2021 1833   UROBILINOGEN 0.2 10/24/2010 0821   NITRITE NEGATIVE 12/23/2021 1833   LEUKOCYTESUR SMALL (A) 12/23/2021 1833    Sepsis Labs: Lactic Acid, Venous    Component Value Date/Time   LATICACIDVEN 1.9 12/22/2021 1823    MICROBIOLOGY: Recent Results (from the past 240 hour(s))  Blood Culture (routine x 2)     Status: None (Preliminary result)   Collection Time: 12/22/21  6:05 PM   Specimen: BLOOD  Result Value Ref Range Status   Specimen Description   Final    BLOOD LEFT ANTECUBITAL Performed at [facility], [address] [place], [place] [address]    Special Requests   Final    Blood Culture adequate volume BOTTLES DRAWN AEROBIC AND ANAEROBIC Performed at [facility], [address] [place], [place] [address]    Culture   Final    NO GROWTH 2 DAYS Performed at [facility], [address], [place], [place] [address]    Report Status PENDING  Incomplete  Blood Culture (routine x 2)     Status: None (Preliminary result)   Collection Time: 12/22/21  7:00 PM   Specimen: BLOOD  Result Value Ref Range Status   Specimen Description   Final    BLOOD BLOOD RIGHT FOREARM Performed at [facility], [address], [place], [place] [address]    Special Requests   Final    Blood Culture adequate volume BOTTLES DRAWN AEROBIC AND ANAEROBIC Performed at [facility], [address], [place], [place] [address]    Culture   Final    NO GROWTH 2 DAYS Performed at [facility], [address] [place], [place] [address]    Report Status PENDING  Incomplete  Resp Panel by RT-PCR (Flu A&B, Covid) Nasopharyngeal Swab      Status: None   Collection Time: 12/22/21  7:04 PM   Specimen: Nasopharyngeal Swab; Nasopharyngeal(NP) swabs in vial transport medium  Result Value Ref Range Status   SARS Coronavirus 2 by RT PCR NEGATIVE NEGATIVE Final    Comment: (NOTE) SARS-CoV-2 target nucleic acids are NOT DETECTED.  The SARS-CoV-2 RNA is generally detectable in upper respiratory specimens during the acute phase of infection. The lowest concentration of SARS-CoV-2 viral copies this assay can detect is 138 copies/mL. A negative result does not preclude SARS-Cov-2 infection and should not be used as the sole basis for treatment or other patient management decisions. A negative result may occur with  improper specimen collection/handling, submission of specimen other than nasopharyngeal swab, presence of viral mutation(s) within the areas targeted by this assay, and inadequate number of viral copies(<138 copies/mL). A negative result must be combined with clinical observations, patient history,  and epidemiological information. The expected result is Negative.  Fact Sheet for Patients:  BloggerCourse.com  Fact Sheet for Healthcare Providers:  SeriousBroker.it  This test is no t yet approved or cleared by the [place] FDA and  has been authorized for detection and/or diagnosis of SARS-CoV-2 by FDA under an Emergency Use Authorization (EUA). This EUA will remain  in effect (meaning this test can be used) for the duration of the COVID-19 declaration under Section 564(b)(1) of the Act, 21 U.S.C.section 360bbb-3(b)(1), unless the authorization is terminated  or revoked sooner.       Influenza A by PCR NEGATIVE NEGATIVE Final   Influenza B by PCR NEGATIVE NEGATIVE Final    Comment: (NOTE) The Xpert Xpress SARS-CoV-2/FLU/RSV plus assay is intended as an aid in the diagnosis of influenza from Nasopharyngeal swab specimens and should not be used as a sole basis  for treatment. Nasal washings and aspirates are unacceptable for Xpert Xpress SARS-CoV-2/FLU/RSV testing.  Fact Sheet for Patients: BloggerCourse.com  Fact Sheet for Healthcare Providers: SeriousBroker.it  This test is not yet approved or cleared by the [place] FDA and has been authorized for detection and/or diagnosis of SARS-CoV-2 by FDA under an Emergency Use Authorization (EUA). This EUA will remain in effect (meaning this test can be used) for the duration of the COVID-19 declaration under Section 564(b)(1) of the Act, 21 U.S.C. section 360bbb-3(b)(1), unless the authorization is terminated or revoked.  Performed at [facility], [address] [place], [place] [address]   Surgical PCR screen     Status: Abnormal   Collection Time: 12/23/21  5:17 PM   Specimen: Nasal Mucosa; Nasal Swab  Result Value Ref Range Status   MRSA, PCR POSITIVE (A) NEGATIVE Final    Comment: RESULT SENT TO [facility] @2104  12/23/21   Staphylococcus aureus POSITIVE (A) NEGATIVE Final    Comment: (NOTE) The Xpert SA Assay (FDA approved for NASAL specimens in patients 78 years of age and older), is one component of a comprehensive surveillance program. It is not intended to diagnose infection nor to guide or monitor treatment. Performed at [facility], [address], [place], [place]   Aerobic/Anaerobic Culture w Gram Stain (surgical/deep wound)     Status: None (Preliminary result)   Collection Time: 12/23/21  8:28 PM   Specimen: Abscess  Result Value Ref Range Status   Specimen Description ABSCESS  Final   Special Requests   Final    LEFT BUTTOCK Performed at [facility], [address], [place], [place]    Gram Stain PENDING  Incomplete   Culture PENDING  Incomplete   Report Status PENDING  Incomplete    RADIOLOGY STUDIES/RESULTS: CT ABDOMEN PELVIS W CONTRAST  Result Date:  12/22/2021 CLINICAL DATA:  Left lower quadrant abdominal pain. Left buttocks abscess. EXAM: CT ABDOMEN AND PELVIS WITH CONTRAST TECHNIQUE: Multidetector CT imaging of the abdomen and pelvis was performed using the standard protocol following bolus administration of intravenous contrast. CONTRAST:  02/19/2022 OMNIPAQUE IOHEXOL 300 MG/ML  SOLN COMPARISON:  None. FINDINGS: Lower chest: No acute abnormality. Hepatobiliary: There is a heterogeneously enhancing lesion in the posterior right lobe of the liver measuring 2.4 by 1.6 cm which is more homogeneous with surrounding liver on delayed imaging most compatible with hepatic hemangioma. Liver and bile ducts are within normal limits. Pancreas: Unremarkable. No pancreatic ductal dilatation or surrounding inflammatory changes. Spleen: Normal in size without focal abnormality. Adrenals/Urinary Tract: The kidneys and adrenal  glands are within normal limits. There is mild bladder wall emphysema. There is a tiny amount of extraluminal gas image 2/80. Stomach/Bowel: Stomach is within normal limits. Appendix appears normal. No evidence of bowel wall thickening, distention, or inflammatory changes. There is colonic diverticulosis without evidence for acute diverticulitis. Vascular/Lymphatic: IVC and aorta are normal in size. There are enlarged left inguinal lymph nodes measuring up to 12 mm short axis. Reproductive: Uterus and bilateral adnexa are unremarkable. Other: There is no ascites or focal abdominal wall hernia. There is marked subcutaneous edema in the medial left buttocks near the gluteal cleft extending to the level of the left rectum. There are no rim enhancing fluid collections. There is no soft tissue gas in this region. Ill-defined fluid seen near the left gluteal cleft posteriorly image 2/89 measuring 4.2 x 3.6 x 3.9 cm and posterior to the left rectum/anus image 2/97 measuring 7.4 x 2.4 by 2.6 cm. Musculoskeletal: There are degenerative changes at L4-L5. No acute  fractures are seen. No osseous erosions are identified. IMPRESSION: 1. Subcutaneous fluid and stranding in the left buttock extending to the rectum. Two ill-defined fluid collections are identified in this region which may represent phlegmon or early abscess formation. No rim enhancing collections are identified. No evidence for soft tissue gas in this region. 2. Findings worrisome for emphysematous cystitis. There is also small amount of extraluminal gas near the bladder, indeterminate. Small amount of bladder perforation can not be excluded. Electronically Signed   By: Darliss Cheney M.D.   On: 12/22/2021 20:13   DG Chest Port 1 View  Result Date: 12/22/2021 CLINICAL DATA:  Sepsis EXAM: PORTABLE CHEST 1 VIEW COMPARISON:  None. FINDINGS: Lungs are clear. No pneumothorax or pleural effusion. Cardiac size within normal limits. Pulmonary vascularity is normal. Osseous structures are age-appropriate. No acute bone abnormality. IMPRESSION: No active disease. Electronically Signed   By: Helyn Numbers M.D.   On: 12/22/2021 18:52     LOS: 1 day   Jeoffrey Massed, MD  [facility]    To contact the attending provider between 7A-7P or the covering provider during after hours 7P-7A, please log into the web site [email or web address] and access using universal [address] password for that web site. If you do not have the password, please call the hospital operator.  12/24/2021, 10:21 AM

## 2021-12-24 NOTE — Progress Notes (Signed)
Pt refused CPAP

## 2021-12-25 ENCOUNTER — Encounter (HOSPITAL_COMMUNITY): Payer: Self-pay | Admitting: General Surgery

## 2021-12-25 DIAGNOSIS — N3080 Other cystitis without hematuria: Secondary | ICD-10-CM

## 2021-12-25 DIAGNOSIS — L0231 Cutaneous abscess of buttock: Secondary | ICD-10-CM | POA: Diagnosis not present

## 2021-12-25 DIAGNOSIS — F3289 Other specified depressive episodes: Secondary | ICD-10-CM | POA: Diagnosis not present

## 2021-12-25 DIAGNOSIS — I10 Essential (primary) hypertension: Secondary | ICD-10-CM | POA: Diagnosis not present

## 2021-12-25 LAB — GLUCOSE, CAPILLARY
Glucose-Capillary: 124 mg/dL — ABNORMAL HIGH (ref 70–99)
Glucose-Capillary: 136 mg/dL — ABNORMAL HIGH (ref 70–99)
Glucose-Capillary: 107 mg/dL — ABNORMAL HIGH (ref 70–99)
Glucose-Capillary: 107 mg/dL — ABNORMAL HIGH (ref 70–99)

## 2021-12-25 LAB — CBC
HCT: 28 % — ABNORMAL LOW (ref 36.0–46.0)
Hemoglobin: 9.1 g/dL — ABNORMAL LOW (ref 12.0–15.0)
MCH: 26.8 pg (ref 26.0–34.0)
MCHC: 32.5 g/dL (ref 30.0–36.0)
MCV: 82.6 fL (ref 80.0–100.0)
Platelets: 384 10*3/uL (ref 150–400)
RBC: 3.39 MIL/uL — ABNORMAL LOW (ref 3.87–5.11)
RDW: 14.4 % (ref 11.5–15.5)
WBC: 16.6 10*3/uL — ABNORMAL HIGH (ref 4.0–10.5)
nRBC: 0 % (ref 0.0–0.2)

## 2021-12-25 LAB — MAGNESIUM: Magnesium: 1.7 mg/dL (ref 1.7–2.4)

## 2021-12-25 LAB — BASIC METABOLIC PANEL
Anion gap: 10 (ref 5–15)
BUN: 13 mg/dL (ref 6–20)
CO2: 26 mmol/L (ref 22–32)
Calcium: 8.4 mg/dL — ABNORMAL LOW (ref 8.9–10.3)
Chloride: 101 mmol/L (ref 98–111)
Creatinine, Ser: 0.58 mg/dL (ref 0.44–1.00)
GFR, Estimated: >60 mL/min (ref >60)
Glucose, Bld: 144 mg/dL — ABNORMAL HIGH (ref 70–99)
Potassium: 3.3 mmol/L — ABNORMAL LOW (ref 3.5–5.1)
Sodium: 137 mmol/L (ref 135–145)

## 2021-12-25 MED ORDER — MAGNESIUM SULFATE 4 GM/100ML IV SOLN
4.0000 g | Freq: Once | INTRAVENOUS | Status: AC
  Administered 2021-12-25: 4 g via INTRAVENOUS
  Filled 2021-12-25: qty 100

## 2021-12-25 MED ORDER — POTASSIUM CHLORIDE CRYS ER 20 MEQ PO TBCR
40.0000 meq | EXTENDED_RELEASE_TABLET | ORAL | Status: AC
  Administered 2021-12-25 (×2): 40 meq via ORAL
  Filled 2021-12-25 (×2): qty 2

## 2021-12-25 MED ORDER — INSULIN ASPART 100 UNIT/ML IJ SOLN
6.0000 [IU] | Freq: Three times a day (TID) | INTRAMUSCULAR | Status: DC
  Administered 2021-12-25 – 2021-12-29 (×13): 6 [IU] via SUBCUTANEOUS

## 2021-12-25 MED ORDER — INSULIN DETEMIR 100 UNIT/ML ~~LOC~~ SOLN
22.0000 [IU] | Freq: Every day | SUBCUTANEOUS | Status: DC
  Administered 2021-12-25 – 2021-12-30 (×6): 22 [IU] via SUBCUTANEOUS
  Filled 2021-12-25 (×6): qty 0.22

## 2021-12-25 MED ORDER — VANCOMYCIN HCL 2000 MG/400ML IV SOLN
2000.0000 mg | INTRAVENOUS | Status: DC
  Administered 2021-12-25 – 2021-12-26 (×2): 2000 mg via INTRAVENOUS
  Filled 2021-12-25 (×2): qty 400

## 2021-12-25 NOTE — Care Management (Signed)
Consulted pharmacy on cost of  home medications due to patient reporting insurance not covering halfway through last year. Anticipate this is related to coverage gap, or "donut hole." If so should be rectified with new calendar year, pharmacy input appreciated.   Benefits check:  -  Glucaphage extended release 500 mg bid  -  Januvia 100 mg Daily  -  Janumet 50-500 mg bid  -  Lantus solostar insulin pen 30 units Daily  -  Levemir flexpen insulin pen 30 units Daily  -  Tresiba pen 30 units Daily

## 2021-12-25 NOTE — Progress Notes (Signed)
Pt refused CPAP tonight. Pt vitals are stable and pt is not in any distress. RT will continue to monitor as needed.

## 2021-12-25 NOTE — Progress Notes (Signed)
PROGRESS NOTE        PATIENT DETAILS Name: Katrina Montes Age: 51 y.o. Sex: female Date of Birth: 1971-06-13 Admit Date: 12/22/2021 Admitting Physician Carollee Herter, DO PCP:Pcp, No  Brief Narrative: Patient is a 52 y.o. female with history of poorly controlled poorly controlled DM-2, HTN, MS-presented with left gluteal abscess-and hyperglycemic hyperosmolar state-started on broad-spectrum antibiotics/IV insulin-evaluated by general surgery and underwent I&D.  Patient was subsequently admitted to the hospitalist service.  See below for further details.  Subjective: Lying comfortably in bed-feels better.  No chest pain or shortness of breath.  Objective: Vitals: Blood pressure 112/72, pulse 90, temperature 97.8 F (36.6 C), temperature source Oral, resp. rate (!) 21, height 5\' 6"  (1.676 m), weight 129.1 kg, last menstrual period 12/12/2021, SpO2 97 %.   Exam: Gen Exam:Alert awake-not in any distress HEENT:atraumatic, normocephalic Chest: B/L clear to auscultation anteriorly CVS:S1S2 regular Abdomen:soft non tender, non distended Extremities:no edema Neurology: Non focal Skin: no rash   Pertinent Labs/Radiology: Recent Labs  Lab 12/22/21 1823 12/23/21 1240 12/25/21 0057  WBC 27.2*   < > 16.6*  HGB 11.6*   < > 9.1*  PLT 444*   < > 384  NA 131*   < > 137  K 3.4*   < > 3.3*  CREATININE 1.06*   < > 0.58  AST 10*  --   --   ALT 10  --   --   ALKPHOS 157*  --   --   BILITOT 0.8  --   --    < > = values in this interval not displayed.     Assessment/Plan: Left gluteal abscess: S/p I&D on 1/6-continue IV vancomycin-General surgery following.  Follow wound cultures  Emphysematous cystitis: Improving-leukocytosis downtrending-afebrile.  Continue cefepime/fluconazole-preliminary urine cultures growing staph aureus and yeast.  Has significant candidal skin infection in the groin.  We will attempt to remove Foley catheter in the next day or  so.  Non ketotic Hyperosmolar hyperglycemic state: No longer on IV insulin-transitioned to SQ insulin  DM-2 uncontrolled (A1c 12.4 on 1/6): Per patient/sister at bedside-A1c has been easily more than 10 for the past several years.  Apparently takes oral hypoglycemic agents and Basaglar 30 units daily-however for the past several months has been noncompliant to insulin.  CBGs stabilizing-increase Levemir to 22 units daily-increase Premeal NovoLog to 6 units.    Recent Labs    12/24/21 1611 12/24/21 2015 12/25/21 0749  GLUCAP 219* 144* 124*    Perineal/vaginal candidiasis: Continue topical antifungals for groin infection-also on IV fluconazole-as she has emphysematous cystitis.  Prolonged QTC: Likely due to hypokalemia-resolved on last EKG.  Watch closely   Hypokalemia: Continue to replete and recheck.  Hypomagnesemia: Replete and recheck  HTN: BP stable-continue lisinopril-hold HCTZ for now.  History of multiple sclerosis: Stable-on ocrelizumab injection every 6 months.  Mood disorder: Stable-continue Cymbalta  History of migraine headaches: Continue Topamax.  OSA: CPAP nightly  Morbid Obesity Estimated body mass index is 45.94 kg/m as calculated from the following:   Height as of this encounter: 5\' 6"  (1.676 m).   Weight as of this encounter: 129.1 kg.   Procedures: 1/6>> I&D of left gluteal abscess. Consults: General surgery DVT Prophylaxis: Lovenox Code Status:Full code  Family Communication:Sister at bedside.  Time spent 25 minutes-Greater than 50% of this time was spent in counseling, explanation of  diagnosis, planning of further management, and coordination of care.   Disposition Plan: Status is: Inpatient  Remains inpatient appropriate because: Gluteal abscess-s/p I&D on 1/6-emphysematous cystitis-significant leukocytosis persists-not yet stable for discharge needs continued IV antimicrobial therapy.        Diet: Diet Order             Diet Carb  Modified Fluid consistency: Thin; Room service appropriate? Yes  Diet effective now                     Antimicrobial agents: Anti-infectives (From admission, onward)    Start     Dose/Rate Route Frequency Ordered Stop   12/25/21 2200  vancomycin (VANCOREADY) IVPB 2000 mg/400 mL        2,000 mg 200 mL/hr over 120 Minutes Intravenous Every 24 hours 12/25/21 0829     12/24/21 1130  fluconazole (DIFLUCAN) IVPB 100 mg        100 mg 50 mL/hr over 60 Minutes Intravenous Every 24 hours 12/24/21 1040 12/29/21 1129   12/24/21 0100  vancomycin (VANCOREADY) IVPB 1750 mg/350 mL  Status:  Discontinued        1,750 mg 175 mL/hr over 120 Minutes Intravenous Every 24 hours 12/23/21 2358 12/25/21 0829   12/23/21 2130  vancomycin (VANCOREADY) IVPB 1750 mg/350 mL  Status:  Discontinued        1,750 mg 175 mL/hr over 120 Minutes Intravenous Every 24 hours 12/22/21 2009 12/23/21 2358   12/23/21 1959  metroNIDAZOLE (FLAGYL) 500 MG/100ML IVPB       Note to Pharmacy: Shanda Bumps M: cabinet override      12/23/21 1959 12/23/21 2019   12/23/21 1845  metroNIDAZOLE (FLAGYL) IVPB 500 mg        500 mg 100 mL/hr over 60 Minutes Intravenous  Once 12/23/21 1844 12/23/21 2018   12/23/21 1800  fluconazole (DIFLUCAN) tablet 150 mg  Status:  Discontinued        150 mg Oral Daily 12/23/21 1522 12/23/21 2032   12/23/21 0400  ceFEPIme (MAXIPIME) 2 g in sodium chloride 0.9 % 100 mL IVPB        2 g 200 mL/hr over 30 Minutes Intravenous Every 8 hours 12/22/21 2223 12/30/21 0559   12/22/21 2130  vancomycin (VANCOREADY) IVPB 500 mg/100 mL  Status:  Discontinued       See Hyperspace for full Linked Orders Report.   500 mg 100 mL/hr over 60 Minutes Intravenous  Once 12/22/21 2008 12/22/21 2026   12/22/21 2030  vancomycin (VANCOCIN) IVPB 1000 mg/200 mL premix  Status:  Discontinued       See Hyperspace for full Linked Orders Report.   1,000 mg 200 mL/hr over 60 Minutes Intravenous  Once 12/22/21 1915 12/22/21 2008    12/22/21 2030  vancomycin (VANCOREADY) IVPB 500 mg/100 mL  Status:  Discontinued       See Hyperspace for full Linked Orders Report.   500 mg 100 mL/hr over 60 Minutes Intravenous  Once 12/22/21 1915 12/22/21 2008   12/22/21 2030  vancomycin (VANCOCIN) IVPB 1000 mg/200 mL premix       See Hyperspace for full Linked Orders Report.   1,000 mg 200 mL/hr over 60 Minutes Intravenous  Once 12/22/21 2008 12/22/21 2237   12/22/21 2030  vancomycin (VANCOCIN) 500 mg in sodium chloride 0.9 % 500 mL IVPB        500 mg 250 mL/hr over 120 Minutes Intravenous  Once 12/22/21 2024 12/23/21 0057  12/22/21 2030  vancomycin (VANCOCIN) 500 MG powder       Note to Pharmacy: Ivonne Andrew A: cabinet override      12/22/21 2030 12/23/21 0844   12/22/21 1915  vancomycin (VANCOCIN) 2,500 mg in sodium chloride 0.9 % 500 mL IVPB  Status:  Discontinued        2,500 mg 250 mL/hr over 120 Minutes Intravenous  Once 12/22/21 1910 12/22/21 1913   12/22/21 1915  vancomycin (VANCOCIN) IVPB 1000 mg/200 mL premix       See Hyperspace for full Linked Orders Report.   1,000 mg 200 mL/hr over 60 Minutes Intravenous  Once 12/22/21 1915 12/22/21 2131   12/22/21 1830  ceFEPIme (MAXIPIME) 2 g in sodium chloride 0.9 % 100 mL IVPB        2 g 200 mL/hr over 30 Minutes Intravenous  Once 12/22/21 1824 12/22/21 2004   12/22/21 1830  metroNIDAZOLE (FLAGYL) IVPB 500 mg        500 mg 100 mL/hr over 60 Minutes Intravenous  Once 12/22/21 1824 12/22/21 2045        MEDICATIONS: Scheduled Meds:  Chlorhexidine Gluconate Cloth  6 each Topical Daily   clotrimazole   Topical BID   docusate sodium  50 mg Oral BID   DULoxetine  60 mg Oral Daily   enoxaparin (LOVENOX) injection  60 mg Subcutaneous Q24H   ferrous sulfate  325 mg Oral Q breakfast   insulin aspart  0-15 Units Subcutaneous TID WC   insulin aspart  6 Units Subcutaneous TID WC   insulin detemir  22 Units Subcutaneous Daily   lisinopril  20 mg Oral Daily   mouth rinse  15  mL Mouth Rinse Once   multivitamin with minerals  1 tablet Oral Daily   mupirocin ointment  1 application Nasal BID   norgestimate-ethinyl estradiol  1 tablet Oral Daily   potassium chloride  40 mEq Oral Q4H   topiramate  100 mg Oral QHS   Continuous Infusions:  ceFEPime (MAXIPIME) IV 2 g (12/25/21 0518)   fluconazole (DIFLUCAN) IV 100 mg (12/25/21 1031)   vancomycin     PRN Meds:.acetaminophen **OR** acetaminophen, cyclobenzaprine, dextrose, HYDROmorphone (DILAUDID) injection, lip balm, oxyCODONE   I have personally reviewed following labs and imaging studies  LABORATORY DATA: CBC: Recent Labs  Lab 12/22/21 1823 12/23/21 1825 12/23/21 2240 12/24/21 0126 12/25/21 0057  WBC 27.2*  --  25.9* 25.6* 16.6*  NEUTROABS 23.7*  --   --   --   --   HGB 11.6* 12.2 9.6* 9.7* 9.1*  HCT 35.7* 36.0 29.4* 30.7* 28.0*  MCV 81.5  --  82.4 82.5 82.6  PLT 444*  --  354 363 384     Basic Metabolic Panel: Recent Labs  Lab 12/22/21 1823 12/23/21 1240 12/23/21 1739 12/23/21 1825 12/23/21 2240 12/24/21 0126 12/25/21 0057  NA 131* 136 135 135  --  134* 137  K 3.4* 2.9* 3.0* 4.0  --  3.3* 3.3*  CL 91* 100 101 103  --  101 101  CO2 25 29 26   --   --  23 26  GLUCOSE 560* 173* 172* 173*  --  244* 144*  BUN 15 13 9 15   --  11 13  CREATININE 1.06* 0.56 0.52 0.30*  --  0.59 0.58  CALCIUM 9.5 8.5* 8.3*  --   --  8.4* 8.4*  MG  --   --   --   --  1.6*  --  1.7  GFR: Estimated Creatinine Clearance: 117.1 mL/min (by C-G formula based on SCr of 0.58 mg/dL).  Liver Function Tests: Recent Labs  Lab 12/22/21 1823  AST 10*  ALT 10  ALKPHOS 157*  BILITOT 0.8  PROT 8.4*  ALBUMIN 3.7    No results for input(s): LIPASE, AMYLASE in the last 168 hours. No results for input(s): AMMONIA in the last 168 hours.  Coagulation Profile: Recent Labs  Lab 12/22/21 1823  INR 1.1     Cardiac Enzymes: No results for input(s): CKTOTAL, CKMB, CKMBINDEX, TROPONINI in the last 168 hours.  BNP  (last 3 results) No results for input(s): PROBNP in the last 8760 hours.  Lipid Profile: No results for input(s): CHOL, HDL, LDLCALC, TRIG, CHOLHDL, LDLDIRECT in the last 72 hours.  Thyroid Function Tests: No results for input(s): TSH, T4TOTAL, FREET4, T3FREE, THYROIDAB in the last 72 hours.  Anemia Panel: No results for input(s): VITAMINB12, FOLATE, FERRITIN, TIBC, IRON, RETICCTPCT in the last 72 hours.  Urine analysis:    Component Value Date/Time   COLORURINE AMBER (A) 12/23/2021 1833   APPEARANCEUR HAZY (A) 12/23/2021 1833   LABSPEC 1.015 12/23/2021 1833   PHURINE 5.5 12/23/2021 1833   GLUCOSEU >=500 (A) 12/23/2021 1833   HGBUR LARGE (A) 12/23/2021 1833   HGBUR negative 10/24/2010 0821   BILIRUBINUR SMALL (A) 12/23/2021 1833   KETONESUR NEGATIVE 12/23/2021 1833   PROTEINUR 30 (A) 12/23/2021 1833   UROBILINOGEN 0.2 10/24/2010 0821   NITRITE NEGATIVE 12/23/2021 1833   LEUKOCYTESUR SMALL (A) 12/23/2021 1833    Sepsis Labs: Lactic Acid, Venous    Component Value Date/Time   LATICACIDVEN 1.9 12/22/2021 1823    MICROBIOLOGY: Recent Results (from the past 240 hour(s))  Blood Culture (routine x 2)     Status: None (Preliminary result)   Collection Time: 12/22/21  6:05 PM   Specimen: BLOOD  Result Value Ref Range Status   Specimen Description   Final    BLOOD LEFT ANTECUBITAL Performed at [facility], [address], [place], [place] [address]    Special Requests   Final    Blood Culture adequate volume BOTTLES DRAWN AEROBIC AND ANAEROBIC Performed at [facility], [address] [place], [place] [address]    Culture   Final    NO GROWTH 3 DAYS Performed at [facility], [address] [place], [place] [address]    Report Status PENDING  Incomplete  Blood Culture (routine x 2)     Status: None (Preliminary result)   Collection Time: 12/22/21  7:00 PM   Specimen: BLOOD  Result Value Ref Range Status   Specimen  Description   Final    BLOOD BLOOD RIGHT FOREARM Performed at [facility], [address], [place], [place] [address]    Special Requests   Final    Blood Culture adequate volume BOTTLES DRAWN AEROBIC AND ANAEROBIC Performed at [facility], [address] [place], [place] [address]    Culture   Final    NO GROWTH 3 DAYS Performed at [facility], [address], [place], [place] [address]    Report Status PENDING  Incomplete  Resp Panel by RT-PCR (Flu A&B, Covid) Nasopharyngeal Swab     Status: None   Collection Time: 12/22/21  7:04 PM   Specimen: Nasopharyngeal Swab; Nasopharyngeal(NP) swabs in vial transport medium  Result Value Ref Range Status   SARS Coronavirus 2 by RT PCR NEGATIVE NEGATIVE Final    Comment: (NOTE)  SARS-CoV-2 target nucleic acids are NOT DETECTED.  The SARS-CoV-2 RNA is generally detectable in upper respiratory specimens during the acute phase of infection. The lowest concentration of SARS-CoV-2 viral copies this assay can detect is 138 copies/mL. A negative result does not preclude SARS-Cov-2 infection and should not be used as the sole basis for treatment or other patient management decisions. A negative result may occur with  improper specimen collection/handling, submission of specimen other than nasopharyngeal swab, presence of viral mutation(s) within the areas targeted by this assay, and inadequate number of viral copies(<138 copies/mL). A negative result must be combined with clinical observations, patient history, and epidemiological information. The expected result is Negative.  Fact Sheet for Patients:  BloggerCourse.com  Fact Sheet for Healthcare Providers:  SeriousBroker.it  This test is no t yet approved or cleared by the [place] FDA and  has been authorized for detection and/or diagnosis of SARS-CoV-2 by FDA under an Emergency Use  Authorization (EUA). This EUA will remain  in effect (meaning this test can be used) for the duration of the COVID-19 declaration under Section 564(b)(1) of the Act, 21 U.S.C.section 360bbb-3(b)(1), unless the authorization is terminated  or revoked sooner.       Influenza A by PCR NEGATIVE NEGATIVE Final   Influenza B by PCR NEGATIVE NEGATIVE Final    Comment: (NOTE) The Xpert Xpress SARS-CoV-2/FLU/RSV plus assay is intended as an aid in the diagnosis of influenza from Nasopharyngeal swab specimens and should not be used as a sole basis for treatment. Nasal washings and aspirates are unacceptable for Xpert Xpress SARS-CoV-2/FLU/RSV testing.  Fact Sheet for Patients: BloggerCourse.com  Fact Sheet for Healthcare Providers: SeriousBroker.it  This test is not yet approved or cleared by the [place] FDA and has been authorized for detection and/or diagnosis of SARS-CoV-2 by FDA under an Emergency Use Authorization (EUA). This EUA will remain in effect (meaning this test can be used) for the duration of the COVID-19 declaration under Section 564(b)(1) of the Act, 21 U.S.C. section 360bbb-3(b)(1), unless the authorization is terminated or revoked.  Performed at [facility], [address], [place], [place] [address]   Urine Culture     Status: Abnormal (Preliminary result)   Collection Time: 12/23/21  3:43 PM   Specimen: Urine, Clean Catch  Result Value Ref Range Status   Specimen Description URINE, CLEAN CATCH  Final   Special Requests NONE  Final   Culture (A)  Final    40,000 COLONIES/mL STAPHYLOCOCCUS AUREUS 20,000 COLONIES/mL YEAST SUSCEPTIBILITIES TO FOLLOW Performed at [facility], [address] [place], [place] [address]    Report Status PENDING  Incomplete  Surgical PCR screen     Status: Abnormal   Collection Time: 12/23/21  5:17 PM   Specimen: Nasal Mucosa; Nasal Swab  Result  Value Ref Range Status   MRSA, PCR POSITIVE (A) NEGATIVE Final    Comment: RESULT SENT TO [facility]  12/23/21   Staphylococcus aureus POSITIVE (A) NEGATIVE Final    Comment: (NOTE) The Xpert SA Assay (FDA approved for NASAL specimens in patients 59 years of age and older), is one component of a comprehensive surveillance program. It is not intended to diagnose infection nor to guide or monitor treatment. Performed at [facility], [address], [place], [place] [address]   Aerobic/Anaerobic Culture w Gram Stain (surgical/deep wound)     Status: None (Preliminary result)   Collection Time: 12/23/21  8:28 PM   Specimen: Abscess  Result Value Ref Range Status   Specimen Description ABSCESS  Final   Special Requests LEFT BUTTOCK  Final   Gram Stain   Final    MODERATE WBC PRESENT,BOTH PMN AND MONONUCLEAR MODERATE GRAM POSITIVE COCCI Performed at [facility], [address] [place], [place] [address]    Culture PENDING  Incomplete   Report Status PENDING  Incomplete    RADIOLOGY STUDIES/RESULTS: No results found.   LOS: 2 days   Jeoffrey Massed, MD  [facility]    To contact the attending provider between 7A-7P or the covering provider during after hours 7P-7A, please log into the web site [email or web address] and access using universal [place] password for that web site. If you do not have the password, please call the hospital operator.  12/25/2021, 12:15 PM

## 2021-12-25 NOTE — Progress Notes (Signed)
2 Days Post-Op   Chief Complaint/Subjective: Burning pain in the area of infection, tolerating diet  Review of Systems See above, otherwise other systems negative   PMH -  has a past medical history of Anemia, Anxiety, Back pain, Carpal tunnel syndrome, Constipation, Depression, Diabetes mellitus, Hypertension, Joint pain, Lactose intolerance, Multiple sclerosis (HCC), Optic neuritis, Shortness of breath, Swallowing difficulty, Swelling of both lower extremities, Vision, loss, sudden, and Vitamin D deficiency. PSH -  has a past surgical history that includes Cyst excision; Breast reduction surgery; Wisdom tooth extraction; ovarian cyst removed; and Tubal ligation.  [facility] - family history includes Anemia in her mother, sister, and sister; Arthritis in her maternal grandfather, maternal grandmother, paternal grandfather, and paternal grandmother; Cancer in her mother; Diabetes in her mother; Hearing loss in her mother; High blood pressure in her father; Hyperlipidemia in her father and paternal grandfather; Hypertension in her mother and paternal grandmother; Kidney disease in her mother and paternal grandmother; Obesity in her mother; Sleep apnea in her father; Stroke in her paternal grandfather; Thyroid nodules in her mother; Vision loss in her paternal grandfather.   Objective: Vital signs in last 24 hours: Temp:  [97.5 F (36.4 C)-98.6 F (37 C)] 97.8 F (36.6 C) (01/08 0750) Pulse Rate:  [84-94] 90 (01/08 0750) Resp:  [17-34] 21 (01/08 0750) BP: (92-132)/(49-100) 112/72 (01/08 0750) SpO2:  [95 %-98 %] 97 % (01/08 0750) Last BM Date: 12/22/21 Intake/Output from previous day: 01/07 0701 - 01/08 0700 In: 1176.6 [P.O.:480; IV Piggyback:696.6] Out: 1400 [Urine:1400] Intake/Output this shift: No intake/output data recorded.  PE: Gen: NAd Resp: nonlabored Card: RRR GU: packing removed, 3 openings with inflamed subcutaneous regions  Lab Results:  Recent Labs    12/24/21 0126  12/25/21 0057  WBC 25.6* 16.6*  HGB 9.7* 9.1*  HCT 30.7* 28.0*  PLT 363 384   BMET Recent Labs    12/24/21 0126 12/25/21 0057  NA 134* 137  K 3.3* 3.3*  CL 101 101  CO2 23 26  GLUCOSE 244* 144*  BUN 11 13  CREATININE 0.59 0.58  CALCIUM 8.4* 8.4*   PT/INR Recent Labs    12/22/21 1823  LABPROT 14.5  INR 1.1   CMP     Component Value Date/Time   NA 137 12/25/2021 0057   NA 137 07/19/2020 0813   K 3.3 (L) 12/25/2021 0057   CL 101 12/25/2021 0057   CO2 26 12/25/2021 0057   GLUCOSE 144 (H) 12/25/2021 0057   BUN 13 12/25/2021 0057   BUN 12 07/19/2020 0813   CREATININE 0.58 12/25/2021 0057   CREATININE 0.67 11/19/2020 0931   CALCIUM 8.4 (L) 12/25/2021 0057   PROT 8.4 (H) 12/22/2021 1823   PROT 7.2 07/19/2020 0813   ALBUMIN 3.7 12/22/2021 1823   ALBUMIN 3.8 07/19/2020 0813   AST 10 (L) 12/22/2021 1823   ALT 10 12/22/2021 1823   ALKPHOS 157 (H) 12/22/2021 1823   BILITOT 0.8 12/22/2021 1823   BILITOT 0.4 07/19/2020 0813   GFRNONAA >60 12/25/2021 0057   GFRAA 120 07/19/2020 0813   Lipase  No results found for: LIPASE  Studies/Results: No results found.  Anti-infectives: Anti-infectives (From admission, onward)    Start     Dose/Rate Route Frequency Ordered Stop   12/25/21 2200  vancomycin (VANCOREADY) IVPB 2000 mg/400 mL        2,000 mg 200 mL/hr over 120 Minutes Intravenous Every 24 hours 12/25/21 0829     12/24/21 1130  fluconazole (DIFLUCAN) IVPB 100 mg  100 mg 50 mL/hr over 60 Minutes Intravenous Every 24 hours 12/24/21 1040 12/29/21 1129   12/24/21 0100  vancomycin (VANCOREADY) IVPB 1750 mg/350 mL  Status:  Discontinued        1,750 mg 175 mL/hr over 120 Minutes Intravenous Every 24 hours 12/23/21 2358 12/25/21 0829   12/23/21 2130  vancomycin (VANCOREADY) IVPB 1750 mg/350 mL  Status:  Discontinued        1,750 mg 175 mL/hr over 120 Minutes Intravenous Every 24 hours 12/22/21 2009 12/23/21 2358   12/23/21 1959  metroNIDAZOLE (FLAGYL) 500  MG/100ML IVPB       Note to Pharmacy: Shanda Bumps M: cabinet override      12/23/21 1959 12/23/21 2019   12/23/21 1845  metroNIDAZOLE (FLAGYL) IVPB 500 mg        500 mg 100 mL/hr over 60 Minutes Intravenous  Once 12/23/21 1844 12/23/21 2018   12/23/21 1800  fluconazole (DIFLUCAN) tablet 150 mg  Status:  Discontinued        150 mg Oral Daily 12/23/21 1522 12/23/21 2032   12/23/21 0400  ceFEPIme (MAXIPIME) 2 g in sodium chloride 0.9 % 100 mL IVPB        2 g 200 mL/hr over 30 Minutes Intravenous Every 8 hours 12/22/21 2223 12/30/21 0559   12/22/21 2130  vancomycin (VANCOREADY) IVPB 500 mg/100 mL  Status:  Discontinued       See Hyperspace for full Linked Orders Report.   500 mg 100 mL/hr over 60 Minutes Intravenous  Once 12/22/21 2008 12/22/21 2026   12/22/21 2030  vancomycin (VANCOCIN) IVPB 1000 mg/200 mL premix  Status:  Discontinued       See Hyperspace for full Linked Orders Report.   1,000 mg 200 mL/hr over 60 Minutes Intravenous  Once 12/22/21 1915 12/22/21 2008   12/22/21 2030  vancomycin (VANCOREADY) IVPB 500 mg/100 mL  Status:  Discontinued       See Hyperspace for full Linked Orders Report.   500 mg 100 mL/hr over 60 Minutes Intravenous  Once 12/22/21 1915 12/22/21 2008   12/22/21 2030  vancomycin (VANCOCIN) IVPB 1000 mg/200 mL premix       See Hyperspace for full Linked Orders Report.   1,000 mg 200 mL/hr over 60 Minutes Intravenous  Once 12/22/21 2008 12/22/21 2237   12/22/21 2030  vancomycin (VANCOCIN) 500 mg in sodium chloride 0.9 % 500 mL IVPB        500 mg 250 mL/hr over 120 Minutes Intravenous  Once 12/22/21 2024 12/23/21 0057   12/22/21 2030  vancomycin (VANCOCIN) 500 MG powder       Note to Pharmacy: Ivonne Andrew A: cabinet override      12/22/21 2030 12/23/21 0844   12/22/21 1915  vancomycin (VANCOCIN) 2,500 mg in sodium chloride 0.9 % 500 mL IVPB  Status:  Discontinued        2,500 mg 250 mL/hr over 120 Minutes Intravenous  Once 12/22/21 1910 12/22/21 1913    12/22/21 1915  vancomycin (VANCOCIN) IVPB 1000 mg/200 mL premix       See Hyperspace for full Linked Orders Report.   1,000 mg 200 mL/hr over 60 Minutes Intravenous  Once 12/22/21 1915 12/22/21 2131   12/22/21 1830  ceFEPIme (MAXIPIME) 2 g in sodium chloride 0.9 % 100 mL IVPB        2 g 200 mL/hr over 30 Minutes Intravenous  Once 12/22/21 1824 12/22/21 2004   12/22/21 1830  metroNIDAZOLE (FLAGYL) IVPB 500 mg  500 mg 100 mL/hr over 60 Minutes Intravenous  Once 12/22/21 1824 12/22/21 2045       Assessment/Plan  s/p Procedure(s): INCISION AND DRAINAGE BUTTOCK ABSCESS 12/23/2021   FEN - carb mod diet VTE - lovenox ID - vanc cefepime Disposition - continued pain issues   LOS: 2 days   I reviewed last 24 h vitals and pain scores, last 48 h intake and output, last 24 h labs and trends, and last 24 h imaging results.  This care required moderate level of medical decision making.   [facility] 12/25/2021, 9:24 AM Please see Amion for pager number during day hours 7:00am-4:30pm or 7:00am -11:30am on weekends

## 2021-12-25 NOTE — Progress Notes (Signed)
Pharmacy Antibiotic Note  Katrina Montes is a 51 y.o. female admitted on 12/22/2021 with  abscess in left buttock .  Pharmacy has been consulted for cefepime and vancomycin dosing.  WBC has improved from 27.2 to 16.6 and patient currently afebrile. Scr has improved from 1.06 to 0.58. Patient underwent I&D of abscess on 1/6.   Additionally, the patient is on fluconazole per MD for perineal/vaginal candidiasis and emphysematous cystitis.   Calculation:  Vancomycin 2000 mg IV Q 24 hrs. Goal AUC 400-550. Expected AUC: 435.9 SCr used: 0.8 (rounded from 0.58) Vd used: 0.5 L/kg (obesity)   Plan: Continue cefepime 2g q 8h Increase vancomycin dose to 2000mg  IV q24h Fluconazole per MD Monitor renal function, cultures, and signs of clinical improvement  Height: 5\' 6"  (167.6 cm) Weight: 129.1 kg (284 lb 9.8 oz) IBW/kg (Calculated) : 59.3  Temp (24hrs), Avg:98.2 F (36.8 C), Min:97.5 F (36.4 C), Max:98.6 F (37 C)  Recent Labs  Lab 12/22/21 1823 12/23/21 1240 12/23/21 1739 12/23/21 1825 12/23/21 2240 12/24/21 0126 12/25/21 0057  WBC 27.2*  --   --   --  25.9* 25.6* 16.6*  CREATININE 1.06* 0.56 0.52 0.30*  --  0.59 0.58  LATICACIDVEN 1.9  --   --   --   --   --   --      Estimated Creatinine Clearance: 117.1 mL/min (by C-G formula based on SCr of 0.58 mg/dL).    No Known Allergies  Antimicrobials this admission: 1/5 cefepime >>  1/5 vancomycin >>  1/5 metronidazole 1/6 fluconazole>>  Dose adjustments this admission: 01/08: vancomycin 1750 mg daily > 2000 mg daily  Microbiology results: 1/5 BCx: NG x2 days 1/6 UCx: in process 1/6 MRSA PCR: positive 1/6 Wound Cx: moderate GPC  Thank you for involving pharmacy in this patient's care.  Donald Pore, PharmD Pharmacy Resident 12/25/2021, 8:28 AM  **Pharmacist phone directory can be found on [email or web address] listed under [place]**

## 2021-12-26 ENCOUNTER — Other Ambulatory Visit (HOSPITAL_COMMUNITY): Payer: Self-pay

## 2021-12-26 DIAGNOSIS — I10 Essential (primary) hypertension: Secondary | ICD-10-CM | POA: Diagnosis not present

## 2021-12-26 DIAGNOSIS — L0231 Cutaneous abscess of buttock: Secondary | ICD-10-CM | POA: Diagnosis not present

## 2021-12-26 DIAGNOSIS — R9431 Abnormal electrocardiogram [ECG] [EKG]: Secondary | ICD-10-CM | POA: Diagnosis not present

## 2021-12-26 DIAGNOSIS — N3080 Other cystitis without hematuria: Secondary | ICD-10-CM | POA: Diagnosis not present

## 2021-12-26 LAB — GLUCOSE, CAPILLARY
Glucose-Capillary: 141 mg/dL — ABNORMAL HIGH (ref 70–99)
Glucose-Capillary: 131 mg/dL — ABNORMAL HIGH (ref 70–99)
Glucose-Capillary: 185 mg/dL — ABNORMAL HIGH (ref 70–99)
Glucose-Capillary: 100 mg/dL — ABNORMAL HIGH (ref 70–99)

## 2021-12-26 LAB — CBC
HCT: 29.7 % — ABNORMAL LOW (ref 36.0–46.0)
Hemoglobin: 9.1 g/dL — ABNORMAL LOW (ref 12.0–15.0)
MCH: 25.5 pg — ABNORMAL LOW (ref 26.0–34.0)
MCHC: 30.6 g/dL (ref 30.0–36.0)
MCV: 83.2 fL (ref 80.0–100.0)
Platelets: 464 10*3/uL — ABNORMAL HIGH (ref 150–400)
RBC: 3.57 MIL/uL — ABNORMAL LOW (ref 3.87–5.11)
RDW: 14.6 % (ref 11.5–15.5)
WBC: 13.2 10*3/uL — ABNORMAL HIGH (ref 4.0–10.5)
nRBC: 0.2 % (ref 0.0–0.2)

## 2021-12-26 LAB — BASIC METABOLIC PANEL
Anion gap: 7 (ref 5–15)
BUN: 13 mg/dL (ref 6–20)
CO2: 25 mmol/L (ref 22–32)
Calcium: 8.5 mg/dL — ABNORMAL LOW (ref 8.9–10.3)
Chloride: 104 mmol/L (ref 98–111)
Creatinine, Ser: 0.6 mg/dL (ref 0.44–1.00)
GFR, Estimated: >60 mL/min (ref >60)
Glucose, Bld: 127 mg/dL — ABNORMAL HIGH (ref 70–99)
Potassium: 4.1 mmol/L (ref 3.5–5.1)
Sodium: 136 mmol/L (ref 135–145)

## 2021-12-26 LAB — URINE CULTURE: Culture: 40000 — AB

## 2021-12-26 LAB — MAGNESIUM: Magnesium: 2.1 mg/dL (ref 1.7–2.4)

## 2021-12-26 NOTE — TOC Benefit Eligibility Note (Signed)
Patient Product/process development scientist completed.    The patient is currently admitted and upon discharge could be taking Glucophage ER 500 mg tablets.  The current 30 day co-pay is, $0.00.   The patient is currently admitted and upon discharge could be taking Januvia 100 mg tablets.  The current 30 day co-pay is, $47.00.   The patient is currently admitted and upon discharge could be taking Janumet 50-500 mg tablets.  The current 30 day co-pay is, $47.00.   The patient is currently admitted and upon discharge could be taking Lantus pens.  Not Covered  The patient is currently admitted and upon discharge could be taking Levemir Flex Pen.  The current 30 day co-pay is, $35.00.   The patient is currently admitted and upon discharge could be taking [place] Pen.  The current 30 day co-pay is, $30.00.   The patient is insured through [employer]     Roland Earl, CPhT Pharmacy Patient Advocate Specialist [facility] Patient Advocate Team Direct Number: [phone]  Fax: [phone]

## 2021-12-26 NOTE — Progress Notes (Addendum)
Inpatient Diabetes Program Recommendations  AACE/ADA: New Consensus Statement on Inpatient Glycemic Control (2015)  Target Ranges:  Prepandial:   less than 140 mg/dL      Peak postprandial:   less than 180 mg/dL (1-2 hours)      Critically ill patients:  140 - 180 mg/dL   Lab Results  Component Value Date   GLUCAP 131 (H) 12/26/2021   HGBA1C 12.4 (H) 12/23/2021    Review of Glycemic Control  Latest Reference Range & Units 12/25/21 12:49 12/25/21 16:06 12/25/21 20:05 12/26/21 08:25 12/26/21 11:41  Glucose-Capillary 70 - 99 mg/dL 136 (H) 107 (H) 107 (H) 141 (H) 131 (H)   Diabetes history: DM 2 Outpatient Diabetes medications: Janumet 50-500 mg bid (off insulin x1 month due to cost) Current orders for Inpatient glycemic control:  Levemir 22 units Daily Novolog 0-15 units tid Novolog 6 units tid meal coverage  Inpatient Diabetes Program Recommendations:    Blood sugars look much better.  Benefits check complete by TOC.  It appears that Levemir is 35$ co-pay and 47$ for Janumet 50-500 mg or 0$ co-pay for just Metformin. Agree with current orders.   Will follow.   Thanks,    Adah Perl, RN, BC-ADM Inpatient Diabetes Coordinator Pager [phone]   (8a-5p)

## 2021-12-26 NOTE — Progress Notes (Signed)
3 Days Post-Op   Subjective/Chief Complaint: Still with burning pain, but slightly improved   Objective: Vital signs in last 24 hours: Temp:  [97.7 F (36.5 C)-98 F (36.7 C)] 97.8 F (36.6 C) (01/09 0822) Pulse Rate:  [85-99] 85 (01/09 0822) Resp:  [17-21] 17 (01/09 0822) BP: (110-135)/(76-89) 135/89 (01/09 0822) SpO2:  [94 %-99 %] 99 % (01/09 0822) Last BM Date: 12/22/21  Intake/Output from previous day: 01/08 0701 - 01/09 0700 In: 550 [IV Piggyback:550] Out: 700 [Urine:700] Intake/Output this shift: No intake/output data recorded.  Exam: Awake and alert Buttock wound stable, still with induration and drainage as expected  Lab Results:  Recent Labs    12/25/21 0057 12/26/21 0212  WBC 16.6* 13.2*  HGB 9.1* 9.1*  HCT 28.0* 29.7*  PLT 384 464*   BMET Recent Labs    12/25/21 0057 12/26/21 0212  NA 137 136  K 3.3* 4.1  CL 101 104  CO2 26 25  GLUCOSE 144* 127*  BUN 13 13  CREATININE 0.58 0.60  CALCIUM 8.4* 8.5*   PT/INR No results for input(s): LABPROT, INR in the last 72 hours. ABG No results for input(s): PHART, HCO3 in the last 72 hours.  Invalid input(s): PCO2, PO2  Studies/Results: No results found.  Anti-infectives: Anti-infectives (From admission, onward)    Start     Dose/Rate Route Frequency Ordered Stop   12/25/21 2200  vancomycin (VANCOREADY) IVPB 2000 mg/400 mL        2,000 mg 200 mL/hr over 120 Minutes Intravenous Every 24 hours 12/25/21 0829     12/24/21 1130  fluconazole (DIFLUCAN) IVPB 100 mg        100 mg 50 mL/hr over 60 Minutes Intravenous Every 24 hours 12/24/21 1040 12/29/21 1129   12/24/21 0100  vancomycin (VANCOREADY) IVPB 1750 mg/350 mL  Status:  Discontinued        1,750 mg 175 mL/hr over 120 Minutes Intravenous Every 24 hours 12/23/21 2358 12/25/21 0829   12/23/21 2130  vancomycin (VANCOREADY) IVPB 1750 mg/350 mL  Status:  Discontinued        1,750 mg 175 mL/hr over 120 Minutes Intravenous Every 24 hours 12/22/21 2009  12/23/21 2358   12/23/21 1959  metroNIDAZOLE (FLAGYL) 500 MG/100ML IVPB       Note to Pharmacy: Shanda Bumps M: cabinet override      12/23/21 1959 12/23/21 2019   12/23/21 1845  metroNIDAZOLE (FLAGYL) IVPB 500 mg        500 mg 100 mL/hr over 60 Minutes Intravenous  Once 12/23/21 1844 12/23/21 2018   12/23/21 1800  fluconazole (DIFLUCAN) tablet 150 mg  Status:  Discontinued        150 mg Oral Daily 12/23/21 1522 12/23/21 2032   12/23/21 0400  ceFEPIme (MAXIPIME) 2 g in sodium chloride 0.9 % 100 mL IVPB        2 g 200 mL/hr over 30 Minutes Intravenous Every 8 hours 12/22/21 2223 12/30/21 0559   12/22/21 2130  vancomycin (VANCOREADY) IVPB 500 mg/100 mL  Status:  Discontinued       See Hyperspace for full Linked Orders Report.   500 mg 100 mL/hr over 60 Minutes Intravenous  Once 12/22/21 2008 12/22/21 2026   12/22/21 2030  vancomycin (VANCOCIN) IVPB 1000 mg/200 mL premix  Status:  Discontinued       See Hyperspace for full Linked Orders Report.   1,000 mg 200 mL/hr over 60 Minutes Intravenous  Once 12/22/21 1915 12/22/21 2008   12/22/21  2030  vancomycin (VANCOREADY) IVPB 500 mg/100 mL  Status:  Discontinued       See Hyperspace for full Linked Orders Report.   500 mg 100 mL/hr over 60 Minutes Intravenous  Once 12/22/21 1915 12/22/21 2008   12/22/21 2030  vancomycin (VANCOCIN) IVPB 1000 mg/200 mL premix       See Hyperspace for full Linked Orders Report.   1,000 mg 200 mL/hr over 60 Minutes Intravenous  Once 12/22/21 2008 12/22/21 2237   12/22/21 2030  vancomycin (VANCOCIN) 500 mg in sodium chloride 0.9 % 500 mL IVPB        500 mg 250 mL/hr over 120 Minutes Intravenous  Once 12/22/21 2024 12/23/21 0057   12/22/21 2030  vancomycin (VANCOCIN) 500 MG powder       Note to Pharmacy: Ivonne Andrew A: cabinet override      12/22/21 2030 12/23/21 0844   12/22/21 1915  vancomycin (VANCOCIN) 2,500 mg in sodium chloride 0.9 % 500 mL IVPB  Status:  Discontinued        2,500 mg 250 mL/hr over  120 Minutes Intravenous  Once 12/22/21 1910 12/22/21 1913   12/22/21 1915  vancomycin (VANCOCIN) IVPB 1000 mg/200 mL premix       See Hyperspace for full Linked Orders Report.   1,000 mg 200 mL/hr over 60 Minutes Intravenous  Once 12/22/21 1915 12/22/21 2131   12/22/21 1830  ceFEPIme (MAXIPIME) 2 g in sodium chloride 0.9 % 100 mL IVPB        2 g 200 mL/hr over 30 Minutes Intravenous  Once 12/22/21 1824 12/22/21 2004   12/22/21 1830  metroNIDAZOLE (FLAGYL) IVPB 500 mg        500 mg 100 mL/hr over 60 Minutes Intravenous  Once 12/22/21 1824 12/22/21 2045       Assessment/Plan: s/p Procedure(s): INCISION AND DRAINAGE BUTTOCK ABSCESS (Left)  Continue antibiotics Ok to increase activity from a surgery standpoint Patient may shower WBC decreasing   LOS: 3 days    Abigail Miyamoto MD 12/26/2021

## 2021-12-26 NOTE — Care Management Important Message (Signed)
Important Message  Patient Details  Name: Katrina Montes MRN: [id number] Date of Birth: August 04, 1971   Medicare Important Message Given:  Yes     Dorena Bodo 12/26/2021, 2:31 PM

## 2021-12-26 NOTE — Progress Notes (Signed)
Pt. Refusing CPAP at this time.  RT will continue to monitor

## 2021-12-26 NOTE — Progress Notes (Signed)
PROGRESS NOTE        PATIENT DETAILS Name: Katrina Montes Age: 51 y.o. Sex: female Date of Birth: 1971-12-11 Admit Date: 12/22/2021 Admitting Physician Carollee Herter, DO PCP:Pcp, No  Brief Narrative: Patient is a 51 y.o. female with history of poorly controlled poorly controlled DM-2, HTN, MS-presented with left gluteal abscess-and hyperglycemic hyperosmolar state-started on broad-spectrum antibiotics/IV insulin-evaluated by general surgery and underwent I&D.  Patient was subsequently admitted to the hospitalist service.  See below for further details.  Subjective: No major issues overnight-continues to improve  Objective: Vitals: Blood pressure 116/88, pulse 95, temperature 97.8 F (36.6 C), temperature source Oral, resp. rate 20, height 5\' 6"  (1.676 m), weight 129.1 kg, last menstrual period 12/12/2021, SpO2 95 %.   Exam: Gen Exam:Alert awake-not in any distress HEENT:atraumatic, normocephalic Chest: B/L clear to auscultation anteriorly CVS:S1S2 regular Abdomen:soft non tender, non distended Extremities:no edema Neurology: Non focal Skin: no rash   Pertinent Labs/Radiology: Recent Labs  Lab 12/22/21 1823 12/23/21 1240 12/26/21 0212  WBC 27.2*   < > 13.2*  HGB 11.6*   < > 9.1*  PLT 444*   < > 464*  NA 131*   < > 136  K 3.4*   < > 4.1  CREATININE 1.06*   < > 0.60  AST 10*  --   --   ALT 10  --   --   ALKPHOS 157*  --   --   BILITOT 0.8  --   --    < > = values in this interval not displayed.     Assessment/Plan: Left gluteal abscess: S/p I&D on 1/6-wound culture positive for MRSA-sensitivities pending-continue IV vancomycin-General surgery following.    Emphysematous cystitis: Overall improved-leukocytosis has trended down significantly-had significant amount of candidal skin infection in the groin-on IV fluconazole/cefepime-urine cultures positive for MRSA/yeast-once cultures are finalized-we will discuss with infectious disease and  convert to oral regimen.  Plan to discontinue Foley catheter today.  Non ketotic Hyperosmolar hyperglycemic state: No longer on IV insulin-transitioned to SQ insulin  DM-2 uncontrolled (A1c 12.4 on 1/6): Per patient/sister at bedside-A1c has been easily more than 10 for the past several years.  Apparently takes oral hypoglycemic agents and Basaglar 30 units daily-however for the past several months has been noncompliant to insulin.  CBGs have significantly improved-continue Levemir 22 units daily, Premeal NovoLog of 6 units and SSI.    Recent Labs    12/25/21 2005 12/26/21 0825 12/26/21 1141  GLUCAP 107* 141* 131*    Perineal/vaginal candidiasis: Continue topical antifungals for groin infection-also on IV fluconazole-as she has emphysematous cystitis.  Prolonged QTC: Likely due to hypokalemia-resolved on last EKG.  Watch closely   Hypokalemia: Continue to replete and recheck.  Hypomagnesemia: Repleted.  HTN: BP stable-continue lisinopril-hold HCTZ for now.  History of multiple sclerosis: Stable-on ocrelizumab injection every 6 months.  Mood disorder: Stable-continue Cymbalta  History of migraine headaches: Continue Topamax.  OSA: CPAP nightly  Morbid Obesity Estimated body mass index is 45.94 kg/m as calculated from the following:   Height as of this encounter: 5\' 6"  (1.676 m).   Weight as of this encounter: 129.1 kg.   Procedures: 1/6>> I&D of left gluteal abscess. Consults: General surgery DVT Prophylaxis: Lovenox Code Status:Full code  Family Communication:Sister at bedside.  Time spent 25 minutes-Greater than 50% of this time was spent in counseling,  explanation of diagnosis, planning of further management, and coordination of care.   Disposition Plan: Status is: Inpatient  Remains inpatient appropriate because: Gluteal abscess-s/p I&D on 1/6-emphysematous cystitis-significant leukocytosis persists-not yet stable for discharge needs continued IV antimicrobial  therapy.        Diet: Diet Order             Diet Carb Modified Fluid consistency: Thin; Room service appropriate? Yes  Diet effective now                     Antimicrobial agents: Anti-infectives (From admission, onward)    Start     Dose/Rate Route Frequency Ordered Stop   12/25/21 2200  vancomycin (VANCOREADY) IVPB 2000 mg/400 mL        2,000 mg 200 mL/hr over 120 Minutes Intravenous Every 24 hours 12/25/21 0829     12/24/21 1130  fluconazole (DIFLUCAN) IVPB 100 mg        100 mg 50 mL/hr over 60 Minutes Intravenous Every 24 hours 12/24/21 1040 12/29/21 1129   12/24/21 0100  vancomycin (VANCOREADY) IVPB 1750 mg/350 mL  Status:  Discontinued        1,750 mg 175 mL/hr over 120 Minutes Intravenous Every 24 hours 12/23/21 2358 12/25/21 0829   12/23/21 2130  vancomycin (VANCOREADY) IVPB 1750 mg/350 mL  Status:  Discontinued        1,750 mg 175 mL/hr over 120 Minutes Intravenous Every 24 hours 12/22/21 2009 12/23/21 2358   12/23/21 1959  metroNIDAZOLE (FLAGYL) 500 MG/100ML IVPB       Note to Pharmacy: Shanda Bumps M: cabinet override      12/23/21 1959 12/23/21 2019   12/23/21 1845  metroNIDAZOLE (FLAGYL) IVPB 500 mg        500 mg 100 mL/hr over 60 Minutes Intravenous  Once 12/23/21 1844 12/23/21 2018   12/23/21 1800  fluconazole (DIFLUCAN) tablet 150 mg  Status:  Discontinued        150 mg Oral Daily 12/23/21 1522 12/23/21 2032   12/23/21 0400  ceFEPIme (MAXIPIME) 2 g in sodium chloride 0.9 % 100 mL IVPB        2 g 200 mL/hr over 30 Minutes Intravenous Every 8 hours 12/22/21 2223 12/30/21 0559   12/22/21 2130  vancomycin (VANCOREADY) IVPB 500 mg/100 mL  Status:  Discontinued       See Hyperspace for full Linked Orders Report.   500 mg 100 mL/hr over 60 Minutes Intravenous  Once 12/22/21 2008 12/22/21 2026   12/22/21 2030  vancomycin (VANCOCIN) IVPB 1000 mg/200 mL premix  Status:  Discontinued       See Hyperspace for full Linked Orders Report.   1,000 mg 200  mL/hr over 60 Minutes Intravenous  Once 12/22/21 1915 12/22/21 2008   12/22/21 2030  vancomycin (VANCOREADY) IVPB 500 mg/100 mL  Status:  Discontinued       See Hyperspace for full Linked Orders Report.   500 mg 100 mL/hr over 60 Minutes Intravenous  Once 12/22/21 1915 12/22/21 2008   12/22/21 2030  vancomycin (VANCOCIN) IVPB 1000 mg/200 mL premix       See Hyperspace for full Linked Orders Report.   1,000 mg 200 mL/hr over 60 Minutes Intravenous  Once 12/22/21 2008 12/22/21 2237   12/22/21 2030  vancomycin (VANCOCIN) 500 mg in sodium chloride 0.9 % 500 mL IVPB        500 mg 250 mL/hr over 120 Minutes Intravenous  Once 12/22/21 2024 12/23/21 0057  12/22/21 2030  vancomycin (VANCOCIN) 500 MG powder       Note to Pharmacy: Ivonne Andrew A: cabinet override      12/22/21 2030 12/23/21 0844   12/22/21 1915  vancomycin (VANCOCIN) 2,500 mg in sodium chloride 0.9 % 500 mL IVPB  Status:  Discontinued        2,500 mg 250 mL/hr over 120 Minutes Intravenous  Once 12/22/21 1910 12/22/21 1913   12/22/21 1915  vancomycin (VANCOCIN) IVPB 1000 mg/200 mL premix       See Hyperspace for full Linked Orders Report.   1,000 mg 200 mL/hr over 60 Minutes Intravenous  Once 12/22/21 1915 12/22/21 2131   12/22/21 1830  ceFEPIme (MAXIPIME) 2 g in sodium chloride 0.9 % 100 mL IVPB        2 g 200 mL/hr over 30 Minutes Intravenous  Once 12/22/21 1824 12/22/21 2004   12/22/21 1830  metroNIDAZOLE (FLAGYL) IVPB 500 mg        500 mg 100 mL/hr over 60 Minutes Intravenous  Once 12/22/21 1824 12/22/21 2045        MEDICATIONS: Scheduled Meds:  Chlorhexidine Gluconate Cloth  6 each Topical Daily   clotrimazole   Topical BID   docusate sodium  50 mg Oral BID   DULoxetine  60 mg Oral Daily   enoxaparin (LOVENOX) injection  60 mg Subcutaneous Q24H   ferrous sulfate  325 mg Oral Q breakfast   insulin aspart  0-15 Units Subcutaneous TID WC   insulin aspart  6 Units Subcutaneous TID WC   insulin detemir  22 Units  Subcutaneous Daily   lisinopril  20 mg Oral Daily   mouth rinse  15 mL Mouth Rinse Once   multivitamin with minerals  1 tablet Oral Daily   mupirocin ointment  1 application Nasal BID   norgestimate-ethinyl estradiol  1 tablet Oral Daily   topiramate  100 mg Oral QHS   Continuous Infusions:  ceFEPime (MAXIPIME) IV 2 g (12/26/21 0541)   fluconazole (DIFLUCAN) IV 100 mg (12/26/21 0957)   vancomycin 2,000 mg (12/25/21 2239)   PRN Meds:.acetaminophen **OR** acetaminophen, cyclobenzaprine, dextrose, HYDROmorphone (DILAUDID) injection, lip balm, oxyCODONE   I have personally reviewed following labs and imaging studies  LABORATORY DATA: CBC: Recent Labs  Lab 12/22/21 1823 12/23/21 1825 12/23/21 2240 12/24/21 0126 12/25/21 0057 12/26/21 0212  WBC 27.2*  --  25.9* 25.6* 16.6* 13.2*  NEUTROABS 23.7*  --   --   --   --   --   HGB 11.6* 12.2 9.6* 9.7* 9.1* 9.1*  HCT 35.7* 36.0 29.4* 30.7* 28.0* 29.7*  MCV 81.5  --  82.4 82.5 82.6 83.2  PLT 444*  --  354 363 384 464*     Basic Metabolic Panel: Recent Labs  Lab 12/23/21 1240 12/23/21 1739 12/23/21 1825 12/23/21 2240 12/24/21 0126 12/25/21 0057 12/26/21 0212  NA 136 135 135  --  134* 137 136  K 2.9* 3.0* 4.0  --  3.3* 3.3* 4.1  CL 100 101 103  --  101 101 104  CO2 29 26  --   --  23 26 25   GLUCOSE 173* 172* 173*  --  244* 144* 127*  BUN 13 9 15   --  11 13 13   CREATININE 0.56 0.52 0.30*  --  0.59 0.58 0.60  CALCIUM 8.5* 8.3*  --   --  8.4* 8.4* 8.5*  MG  --   --   --  1.6*  --  1.7  2.1     GFR: Estimated Creatinine Clearance: 117.1 mL/min (by C-G formula based on SCr of 0.6 mg/dL).  Liver Function Tests: Recent Labs  Lab 12/22/21 1823  AST 10*  ALT 10  ALKPHOS 157*  BILITOT 0.8  PROT 8.4*  ALBUMIN 3.7    No results for input(s): LIPASE, AMYLASE in the last 168 hours. No results for input(s): AMMONIA in the last 168 hours.  Coagulation Profile: Recent Labs  Lab 12/22/21 1823  INR 1.1     Cardiac  Enzymes: No results for input(s): CKTOTAL, CKMB, CKMBINDEX, TROPONINI in the last 168 hours.  BNP (last 3 results) No results for input(s): PROBNP in the last 8760 hours.  Lipid Profile: No results for input(s): CHOL, HDL, LDLCALC, TRIG, CHOLHDL, LDLDIRECT in the last 72 hours.  Thyroid Function Tests: No results for input(s): TSH, T4TOTAL, FREET4, T3FREE, THYROIDAB in the last 72 hours.  Anemia Panel: No results for input(s): VITAMINB12, FOLATE, FERRITIN, TIBC, IRON, RETICCTPCT in the last 72 hours.  Urine analysis:    Component Value Date/Time   COLORURINE AMBER (A) 12/23/2021 1833   APPEARANCEUR HAZY (A) 12/23/2021 1833   LABSPEC 1.015 12/23/2021 1833   PHURINE 5.5 12/23/2021 1833   GLUCOSEU >=500 (A) 12/23/2021 1833   HGBUR LARGE (A) 12/23/2021 1833   HGBUR negative 10/24/2010 0821   BILIRUBINUR SMALL (A) 12/23/2021 1833   KETONESUR NEGATIVE 12/23/2021 1833   PROTEINUR 30 (A) 12/23/2021 1833   UROBILINOGEN 0.2 10/24/2010 0821   NITRITE NEGATIVE 12/23/2021 1833   LEUKOCYTESUR SMALL (A) 12/23/2021 1833    Sepsis Labs: Lactic Acid, Venous    Component Value Date/Time   LATICACIDVEN 1.9 12/22/2021 1823    MICROBIOLOGY: Recent Results (from the past 240 hour(s))  Blood Culture (routine x 2)     Status: None (Preliminary result)   Collection Time: 12/22/21  6:05 PM   Specimen: BLOOD  Result Value Ref Range Status   Specimen Description   Final    BLOOD LEFT ANTECUBITAL Performed at [facility], [address] [place], [place] [address]    Special Requests   Final    Blood Culture adequate volume BOTTLES DRAWN AEROBIC AND ANAEROBIC Performed at [facility], [address], [place], [place] [address]    Culture   Final    NO GROWTH 4 DAYS Performed at [facility], [address], [place], [place] [address]    Report Status PENDING  Incomplete  Blood Culture (routine x 2)     Status: None (Preliminary result)    Collection Time: 12/22/21  7:00 PM   Specimen: BLOOD  Result Value Ref Range Status   Specimen Description   Final    BLOOD BLOOD RIGHT FOREARM Performed at [facility], [address], [place], [place] [address]    Special Requests   Final    Blood Culture adequate volume BOTTLES DRAWN AEROBIC AND ANAEROBIC Performed at [facility], [address], [place], [place] [address]    Culture   Final    NO GROWTH 4 DAYS Performed at [facility], [address], [place], [place] [address]    Report Status PENDING  Incomplete  Resp Panel by RT-PCR (Flu A&B, Covid) Nasopharyngeal Swab     Status: None   Collection Time: 12/22/21  7:04 PM   Specimen: Nasopharyngeal Swab; Nasopharyngeal(NP) swabs in vial transport medium  Result Value Ref Range Status   SARS Coronavirus 2 by RT PCR NEGATIVE NEGATIVE Final  Comment: (NOTE) SARS-CoV-2 target nucleic acids are NOT DETECTED.  The SARS-CoV-2 RNA is generally detectable in upper respiratory specimens during the acute phase of infection. The lowest concentration of SARS-CoV-2 viral copies this assay can detect is 138 copies/mL. A negative result does not preclude SARS-Cov-2 infection and should not be used as the sole basis for treatment or other patient management decisions. A negative result may occur with  improper specimen collection/handling, submission of specimen other than nasopharyngeal swab, presence of viral mutation(s) within the areas targeted by this assay, and inadequate number of viral copies(<138 copies/mL). A negative result must be combined with clinical observations, patient history, and epidemiological information. The expected result is Negative.  Fact Sheet for Patients:  BloggerCourse.com  Fact Sheet for Healthcare Providers:  SeriousBroker.it  This test is no t yet approved or cleared by the [place] FDA and  has  been authorized for detection and/or diagnosis of SARS-CoV-2 by FDA under an Emergency Use Authorization (EUA). This EUA will remain  in effect (meaning this test can be used) for the duration of the COVID-19 declaration under Section 564(b)(1) of the Act, 21 U.S.C.section 360bbb-3(b)(1), unless the authorization is terminated  or revoked sooner.       Influenza A by PCR NEGATIVE NEGATIVE Final   Influenza B by PCR NEGATIVE NEGATIVE Final    Comment: (NOTE) The Xpert Xpress SARS-CoV-2/FLU/RSV plus assay is intended as an aid in the diagnosis of influenza from Nasopharyngeal swab specimens and should not be used as a sole basis for treatment. Nasal washings and aspirates are unacceptable for Xpert Xpress SARS-CoV-2/FLU/RSV testing.  Fact Sheet for Patients: BloggerCourse.com  Fact Sheet for Healthcare Providers: SeriousBroker.it  This test is not yet approved or cleared by the [place] FDA and has been authorized for detection and/or diagnosis of SARS-CoV-2 by FDA under an Emergency Use Authorization (EUA). This EUA will remain in effect (meaning this test can be used) for the duration of the COVID-19 declaration under Section 564(b)(1) of the Act, 21 U.S.C. section 360bbb-3(b)(1), unless the authorization is terminated or revoked.  Performed at [facility], [address], [place], [place] [address]   Urine Culture     Status: Abnormal   Collection Time: 12/23/21  3:43 PM   Specimen: Urine, Clean Catch  Result Value Ref Range Status   Specimen Description URINE, CLEAN CATCH  Final   Special Requests   Final    NONE Performed at [facility], [address] [place], [place] [address]    Culture (A)  Final    40,000 COLONIES/mL METHICILLIN RESISTANT STAPHYLOCOCCUS AUREUS 20,000 COLONIES/mL YEAST    Report Status 12/26/2021 FINAL  Final   Organism ID, Bacteria METHICILLIN RESISTANT  STAPHYLOCOCCUS AUREUS (A)  Final      Susceptibility   Methicillin resistant staphylococcus aureus - MIC*    CIPROFLOXACIN >=8 RESISTANT Resistant     GENTAMICIN <=0.5 SENSITIVE Sensitive     NITROFURANTOIN <=16 SENSITIVE Sensitive     OXACILLIN >=4 RESISTANT Resistant     TETRACYCLINE >=16 RESISTANT Resistant     VANCOMYCIN <=0.5 SENSITIVE Sensitive     TRIMETH/SULFA >=320 RESISTANT Resistant     CLINDAMYCIN <=0.25 SENSITIVE Sensitive     RIFAMPIN <=0.5 SENSITIVE Sensitive     Inducible Clindamycin NEGATIVE Sensitive     * 40,000 COLONIES/mL METHICILLIN RESISTANT STAPHYLOCOCCUS AUREUS  Surgical PCR screen     Status: Abnormal   Collection Time: 12/23/21  5:17 PM   Specimen: Nasal Mucosa;  Nasal Swab  Result Value Ref Range Status   MRSA, PCR POSITIVE (A) NEGATIVE Final    Comment: RESULT SENT TO PAMELA HAWKS @2104  12/23/21   Staphylococcus aureus POSITIVE (A) NEGATIVE Final    Comment: (NOTE) The Xpert SA Assay (FDA approved for NASAL specimens in patients 57 years of age and older), is one component of a comprehensive surveillance program. It is not intended to diagnose infection nor to guide or monitor treatment. Performed at [facility], [address], [place], [place]   Aerobic/Anaerobic Culture w Gram Stain (surgical/deep wound)     Status: None (Preliminary result)   Collection Time: 12/23/21  8:28 PM   Specimen: Abscess  Result Value Ref Range Status   Specimen Description ABSCESS  Final   Special Requests LEFT BUTTOCK  Final   Gram Stain   Final    MODERATE WBC PRESENT,BOTH PMN AND MONONUCLEAR MODERATE GRAM POSITIVE COCCI Performed at [facility], [address], [place], [place]    Culture   Final    ABUNDANT METHICILLIN RESISTANT STAPHYLOCOCCUS AUREUS   Report Status PENDING  Incomplete   Organism ID, Bacteria METHICILLIN RESISTANT STAPHYLOCOCCUS AUREUS  Final      Susceptibility   Methicillin resistant staphylococcus aureus -  MIC*    CIPROFLOXACIN 4 RESISTANT Resistant     ERYTHROMYCIN >=8 RESISTANT Resistant     GENTAMICIN <=0.5 SENSITIVE Sensitive     OXACILLIN >=4 RESISTANT Resistant     TETRACYCLINE >=16 RESISTANT Resistant     VANCOMYCIN <=0.5 SENSITIVE Sensitive     TRIMETH/SULFA 160 RESISTANT Resistant     CLINDAMYCIN <=0.25 SENSITIVE Sensitive     RIFAMPIN <=0.5 SENSITIVE Sensitive     Inducible Clindamycin NEGATIVE Sensitive     * ABUNDANT METHICILLIN RESISTANT STAPHYLOCOCCUS AUREUS    RADIOLOGY STUDIES/RESULTS: No results found.   LOS: 3 days   [id number], MD  [facility]    To contact the attending provider between 7A-7P or the covering provider during after hours 7P-7A, please log into the web site [email or web address] and access using universal [place] password for that web site. If you do not have the password, please call the hospital operator.  12/26/2021, 12:13 PM

## 2021-12-27 ENCOUNTER — Other Ambulatory Visit (HOSPITAL_COMMUNITY): Payer: Self-pay

## 2021-12-27 DIAGNOSIS — I10 Essential (primary) hypertension: Secondary | ICD-10-CM | POA: Diagnosis not present

## 2021-12-27 DIAGNOSIS — N3080 Other cystitis without hematuria: Secondary | ICD-10-CM | POA: Diagnosis not present

## 2021-12-27 DIAGNOSIS — L0231 Cutaneous abscess of buttock: Secondary | ICD-10-CM | POA: Diagnosis not present

## 2021-12-27 DIAGNOSIS — R9431 Abnormal electrocardiogram [ECG] [EKG]: Secondary | ICD-10-CM | POA: Diagnosis not present

## 2021-12-27 LAB — CBC
HCT: 30.4 % — ABNORMAL LOW (ref 36.0–46.0)
Hemoglobin: 9.5 g/dL — ABNORMAL LOW (ref 12.0–15.0)
MCH: 26 pg (ref 26.0–34.0)
MCHC: 31.3 g/dL (ref 30.0–36.0)
MCV: 83.3 fL (ref 80.0–100.0)
Platelets: 453 10*3/uL — ABNORMAL HIGH (ref 150–400)
RBC: 3.65 MIL/uL — ABNORMAL LOW (ref 3.87–5.11)
RDW: 14.6 % (ref 11.5–15.5)
WBC: 12.9 10*3/uL — ABNORMAL HIGH (ref 4.0–10.5)
nRBC: 0.2 % (ref 0.0–0.2)

## 2021-12-27 LAB — AEROBIC/ANAEROBIC CULTURE W GRAM STAIN (SURGICAL/DEEP WOUND)

## 2021-12-27 LAB — CULTURE, BLOOD (ROUTINE X 2)
Culture: NO GROWTH
Culture: NO GROWTH
Special Requests: ADEQUATE
Special Requests: ADEQUATE

## 2021-12-27 LAB — GLUCOSE, CAPILLARY
Glucose-Capillary: 126 mg/dL — ABNORMAL HIGH (ref 70–99)
Glucose-Capillary: 120 mg/dL — ABNORMAL HIGH (ref 70–99)
Glucose-Capillary: 84 mg/dL (ref 70–99)
Glucose-Capillary: 213 mg/dL — ABNORMAL HIGH (ref 70–99)

## 2021-12-27 MED ORDER — POLYETHYLENE GLYCOL 3350 17 G PO PACK
17.0000 g | PACK | Freq: Every day | ORAL | Status: DC
  Administered 2021-12-27: 17 g via ORAL
  Filled 2021-12-27 (×2): qty 1

## 2021-12-27 MED ORDER — LINEZOLID 600 MG PO TABS
600.0000 mg | ORAL_TABLET | Freq: Two times a day (BID) | ORAL | Status: DC
  Administered 2021-12-27 – 2021-12-30 (×7): 600 mg via ORAL
  Filled 2021-12-27 (×8): qty 1

## 2021-12-27 NOTE — Progress Notes (Signed)
PROGRESS NOTE        PATIENT DETAILS Name: Katrina Montes Age: 51 y.o. Sex: female Date of Birth: 07-03-1971 Admit Date: 12/22/2021 Admitting Physician Carollee Herter, DO PCP:Pcp, No  Brief Narrative: Patient is a 51 y.o. female with history of poorly controlled poorly controlled DM-2, HTN, MS-presented with left gluteal abscess-and hyperglycemic hyperosmolar state-started on broad-spectrum antibiotics/IV insulin-evaluated by general surgery and underwent I&D.  Patient was subsequently admitted to the hospitalist service.  See below for further details.  Subjective: Lying comfortably in bed-no major issues overnight.  Denies any chest pain or shortness of breath.  Objective: Vitals: Blood pressure 131/80, pulse 88, temperature 97.8 F (36.6 C), temperature source Oral, resp. rate 20, height  (1.676 m), weight 129.1 kg, last menstrual period 12/12/2021, SpO2 96 %.   Exam: Gen Exam:Alert awake-not in any distress HEENT:atraumatic, normocephalic Chest: B/L clear to auscultation anteriorly CVS:S1S2 regular Abdomen:soft non tender, non distended Extremities:no edema Neurology: Non focal Skin: no rash   Pertinent Labs/Radiology: Recent Labs  Lab 12/22/21 1823 12/23/21 1240 12/26/21 0212 12/27/21 0111  WBC 27.2*   < > 13.2* 12.9*  HGB 11.6*   < > 9.1* 9.5*  PLT 444*   < > 464* 453*  NA 131*   < > 136  --   K 3.4*   < > 4.1  --   CREATININE 1.06*   < > 0.60  --   AST 10*  --   --   --   ALT 10  --   --   --   ALKPHOS 157*  --   --   --   BILITOT 0.8  --   --   --    < > = values in this interval not displayed.     Assessment/Plan: Left gluteal abscess due to MRSA: S/p I&D on 1/6-wound culture positive for MRSA-appreciate general surgery input-discussed with infectious disease-Dr. Thedore Mins over the phone-we will switch to Zyvox.    Emphysematous cystitis: Overall improved-leukocytosis has trended down significantly-had significant amount of  candidal skin infection in the groin-on IV fluconazole/cefepime-urine cultures positive for MRSA/yeast-discussed with Dr. Juliann Pares disease-suspect overflow infection causing cystitis.  Recommends stopping fluconazole/cefepime-will be on Zyvox.Foley catheter discontinued on 1/9-voiding spontaneously.  Non ketotic Hyperosmolar hyperglycemic state: No longer on IV insulin-transitioned to SQ insulin  DM-2 uncontrolled (A1c 12.4 on 1/6): Per patient/sister at bedside-A1c has been easily more than 10 for the past several years.  Apparently takes oral hypoglycemic agents and Basaglar 30 units daily-however for the past several months has been noncompliant to insulin as she was in the donut hole.  CBGs have significantly improved-continue Levemir 22 units daily, Premeal NovoLog of 6 units and SSI.    Recent Labs    12/26/21 1543 12/26/21 2036 12/27/21 0803  GLUCAP 185* 100* 126*    Perineal/vaginal candidiasis: Continue topical antifungals for groin infection-after discussion with ID-I have discontinued fluconazole  Prolonged QTC: Likely due to hypokalemia-resolved on last EKG.  Watch closely   Hypokalemia: Repleted.  Hypomagnesemia: Repleted.  HTN: BP stable-continue lisinopril-hold HCTZ for now.  History of multiple sclerosis: Stable-on ocrelizumab injection every 6 months.  Mood disorder: Stable-continue Cymbalta  History of migraine headaches: Continue Topamax.  OSA: CPAP nightly  Morbid Obesity Estimated body mass index is 45.94 kg/m as calculated from the following:   Height as of this encounter:  5\' 6"  (1.676 m).   Weight as of this encounter: 129.1 kg.   Procedures: 1/6>> I&D of left gluteal abscess. Consults: General surgery DVT Prophylaxis: Lovenox Code Status:Full code  Family Communication:None at bedside.   Disposition Plan: Status is: Inpatient  Remains inpatient appropriate because: Gluteal abscess-s/p I&D on 1/6-emphysematous cystitis-significant  leukocytosis persists-not yet stable for discharge needs continued IV antimicrobial therapy.        Diet: Diet Order             Diet Carb Modified Fluid consistency: Thin; Room service appropriate? Yes  Diet effective now                     Antimicrobial agents: Anti-infectives (From admission, onward)    Start     Dose/Rate Route Frequency Ordered Stop   12/25/21 2200  vancomycin (VANCOREADY) IVPB 2000 mg/400 mL        2,000 mg 200 mL/hr over 120 Minutes Intravenous Every 24 hours 12/25/21 0829     12/24/21 1130  fluconazole (DIFLUCAN) IVPB 100 mg        100 mg 50 mL/hr over 60 Minutes Intravenous Every 24 hours 12/24/21 1040 12/29/21 1129   12/24/21 0100  vancomycin (VANCOREADY) IVPB 1750 mg/350 mL  Status:  Discontinued        1,750 mg 175 mL/hr over 120 Minutes Intravenous Every 24 hours 12/23/21 2358 12/25/21 0829   12/23/21 2130  vancomycin (VANCOREADY) IVPB 1750 mg/350 mL  Status:  Discontinued        1,750 mg 175 mL/hr over 120 Minutes Intravenous Every 24 hours 12/22/21 2009 12/23/21 2358   12/23/21 1959  metroNIDAZOLE (FLAGYL) 500 MG/100ML IVPB       Note to Pharmacy: Shanda Bumps M: cabinet override      12/23/21 1959 12/23/21 2019   12/23/21 1845  metroNIDAZOLE (FLAGYL) IVPB 500 mg        500 mg 100 mL/hr over 60 Minutes Intravenous  Once 12/23/21 1844 12/23/21 2018   12/23/21 1800  fluconazole (DIFLUCAN) tablet 150 mg  Status:  Discontinued        150 mg Oral Daily 12/23/21 1522 12/23/21 2032   12/23/21 0400  ceFEPIme (MAXIPIME) 2 g in sodium chloride 0.9 % 100 mL IVPB        2 g 200 mL/hr over 30 Minutes Intravenous Every 8 hours 12/22/21 2223 12/30/21 0559   12/22/21 2130  vancomycin (VANCOREADY) IVPB 500 mg/100 mL  Status:  Discontinued       See Hyperspace for full Linked Orders Report.   500 mg 100 mL/hr over 60 Minutes Intravenous  Once 12/22/21 2008 12/22/21 2026   12/22/21 2030  vancomycin (VANCOCIN) IVPB 1000 mg/200 mL premix  Status:   Discontinued       See Hyperspace for full Linked Orders Report.   1,000 mg 200 mL/hr over 60 Minutes Intravenous  Once 12/22/21 1915 12/22/21 2008   12/22/21 2030  vancomycin (VANCOREADY) IVPB 500 mg/100 mL  Status:  Discontinued       See Hyperspace for full Linked Orders Report.   500 mg 100 mL/hr over 60 Minutes Intravenous  Once 12/22/21 1915 12/22/21 2008   12/22/21 2030  vancomycin (VANCOCIN) IVPB 1000 mg/200 mL premix       See Hyperspace for full Linked Orders Report.   1,000 mg 200 mL/hr over 60 Minutes Intravenous  Once 12/22/21 2008 12/22/21 2237   12/22/21 2030  vancomycin (VANCOCIN) 500 mg in sodium chloride 0.9 %  500 mL IVPB        500 mg 250 mL/hr over 120 Minutes Intravenous  Once 12/22/21 2024 12/23/21 0057   12/22/21 2030  vancomycin (VANCOCIN) 500 MG powder       Note to Pharmacy: Ivonne Andrew A: cabinet override      12/22/21 2030 12/23/21 0844   12/22/21 1915  vancomycin (VANCOCIN) 2,500 mg in sodium chloride 0.9 % 500 mL IVPB  Status:  Discontinued        2,500 mg 250 mL/hr over 120 Minutes Intravenous  Once 12/22/21 1910 12/22/21 1913   12/22/21 1915  vancomycin (VANCOCIN) IVPB 1000 mg/200 mL premix       See Hyperspace for full Linked Orders Report.   1,000 mg 200 mL/hr over 60 Minutes Intravenous  Once 12/22/21 1915 12/22/21 2131   12/22/21 1830  ceFEPIme (MAXIPIME) 2 g in sodium chloride 0.9 % 100 mL IVPB        2 g 200 mL/hr over 30 Minutes Intravenous  Once 12/22/21 1824 12/22/21 2004   12/22/21 1830  metroNIDAZOLE (FLAGYL) IVPB 500 mg        500 mg 100 mL/hr over 60 Minutes Intravenous  Once 12/22/21 1824 12/22/21 2045        MEDICATIONS: Scheduled Meds:  Chlorhexidine Gluconate Cloth  6 each Topical Daily   clotrimazole   Topical BID   docusate sodium  50 mg Oral BID   DULoxetine  60 mg Oral Daily   enoxaparin (LOVENOX) injection  60 mg Subcutaneous Q24H   ferrous sulfate  325 mg Oral Q breakfast   insulin aspart  0-15 Units Subcutaneous  TID WC   insulin aspart  6 Units Subcutaneous TID WC   insulin detemir  22 Units Subcutaneous Daily   lisinopril  20 mg Oral Daily   mouth rinse  15 mL Mouth Rinse Once   multivitamin with minerals  1 tablet Oral Daily   mupirocin ointment  1 application Nasal BID   norgestimate-ethinyl estradiol  1 tablet Oral Daily   polyethylene glycol  17 g Oral Daily   topiramate  100 mg Oral QHS   Continuous Infusions:  ceFEPime (MAXIPIME) IV 2 g (12/27/21 0536)   fluconazole (DIFLUCAN) IV 100 mg (12/26/21 0957)   vancomycin 2,000 mg (12/26/21 2312)   PRN Meds:.acetaminophen **OR** acetaminophen, cyclobenzaprine, dextrose, HYDROmorphone (DILAUDID) injection, lip balm, oxyCODONE   I have personally reviewed following labs and imaging studies  LABORATORY DATA: CBC: Recent Labs  Lab 12/22/21 1823 12/23/21 1825 12/23/21 2240 12/24/21 0126 12/25/21 0057 12/26/21 0212 12/27/21 0111  WBC 27.2*  --  25.9* 25.6* 16.6* 13.2* 12.9*  NEUTROABS 23.7*  --   --   --   --   --   --   HGB 11.6*   < > 9.6* 9.7* 9.1* 9.1* 9.5*  HCT 35.7*   < > 29.4* 30.7* 28.0* 29.7* 30.4*  MCV 81.5  --  82.4 82.5 82.6 83.2 83.3  PLT 444*  --  354 363 384 464* 453*   < > = values in this interval not displayed.     Basic Metabolic Panel: Recent Labs  Lab 12/23/21 1240 12/23/21 1739 12/23/21 1825 12/23/21 2240 12/24/21 0126 12/25/21 0057 12/26/21 0212  NA 136 135 135  --  134* 137 136  K 2.9* 3.0* 4.0  --  3.3* 3.3* 4.1  CL 100 101 103  --  101 101 104  CO2 29 26  --   --  23 26 25  GLUCOSE 173* 172* 173*  --  244* 144* 127*  BUN 13 9 15   --  11 13 13   CREATININE 0.56 0.52 0.30*  --  0.59 0.58 0.60  CALCIUM 8.5* 8.3*  --   --  8.4* 8.4* 8.5*  MG  --   --   --  1.6*  --  1.7 2.1     GFR: Estimated Creatinine Clearance: 117.1 mL/min (by C-G formula based on SCr of 0.6 mg/dL).  Liver Function Tests: Recent Labs  Lab 12/22/21 1823  AST 10*  ALT 10  ALKPHOS 157*  BILITOT 0.8  PROT 8.4*   ALBUMIN 3.7    No results for input(s): LIPASE, AMYLASE in the last 168 hours. No results for input(s): AMMONIA in the last 168 hours.  Coagulation Profile: Recent Labs  Lab 12/22/21 1823  INR 1.1     Cardiac Enzymes: No results for input(s): CKTOTAL, CKMB, CKMBINDEX, TROPONINI in the last 168 hours.  BNP (last 3 results) No results for input(s): PROBNP in the last 8760 hours.  Lipid Profile: No results for input(s): CHOL, HDL, LDLCALC, TRIG, CHOLHDL, LDLDIRECT in the last 72 hours.  Thyroid Function Tests: No results for input(s): TSH, T4TOTAL, FREET4, T3FREE, THYROIDAB in the last 72 hours.  Anemia Panel: No results for input(s): VITAMINB12, FOLATE, FERRITIN, TIBC, IRON, RETICCTPCT in the last 72 hours.  Urine analysis:    Component Value Date/Time   COLORURINE AMBER (A) 12/23/2021 1833   APPEARANCEUR HAZY (A) 12/23/2021 1833   LABSPEC 1.015 12/23/2021 1833   PHURINE 5.5 12/23/2021 1833   GLUCOSEU >=500 (A) 12/23/2021 1833   HGBUR LARGE (A) 12/23/2021 1833   HGBUR negative 10/24/2010 0821   BILIRUBINUR SMALL (A) 12/23/2021 1833   KETONESUR NEGATIVE 12/23/2021 1833   PROTEINUR 30 (A) 12/23/2021 1833   UROBILINOGEN 0.2 10/24/2010 0821   NITRITE NEGATIVE 12/23/2021 1833   LEUKOCYTESUR SMALL (A) 12/23/2021 1833    Sepsis Labs: Lactic Acid, Venous    Component Value Date/Time   LATICACIDVEN 1.9 12/22/2021 1823    MICROBIOLOGY: Recent Results (from the past 240 hour(s))  Blood Culture (routine x 2)     Status: None   Collection Time: 12/22/21  6:05 PM   Specimen: BLOOD  Result Value Ref Range Status   Specimen Description   Final    BLOOD LEFT ANTECUBITAL Performed at [facility], [address] [place], [address] [place]    Special Requests   Final    Blood Culture adequate volume BOTTLES DRAWN AEROBIC AND ANAEROBIC Performed at [facility], [address], [place], [address] [place]    Culture   Final     NO GROWTH 5 DAYS Performed at [facility], [address], [place], [address] [place]    Report Status 12/27/2021 FINAL  Final  Blood Culture (routine x 2)     Status: None   Collection Time: 12/22/21  7:00 PM   Specimen: BLOOD  Result Value Ref Range Status   Specimen Description   Final    BLOOD BLOOD RIGHT FOREARM Performed at [facility], [address], [place], [address] [place]    Special Requests   Final    Blood Culture adequate volume BOTTLES DRAWN AEROBIC AND ANAEROBIC Performed at [facility], [address] [place], [address] [place]    Culture   Final    NO GROWTH 5 DAYS Performed at [facility], [address] [place], [address] [place]  Report Status 12/27/2021 FINAL  Final  Resp Panel by RT-PCR (Flu A&B, Covid) Nasopharyngeal Swab     Status: None   Collection Time: 12/22/21  7:04 PM   Specimen: Nasopharyngeal Swab; Nasopharyngeal(NP) swabs in vial transport medium  Result Value Ref Range Status   SARS Coronavirus 2 by RT PCR NEGATIVE NEGATIVE Final    Comment: (NOTE) SARS-CoV-2 target nucleic acids are NOT DETECTED.  The SARS-CoV-2 RNA is generally detectable in upper respiratory specimens during the acute phase of infection. The lowest concentration of SARS-CoV-2 viral copies this assay can detect is 138 copies/mL. A negative result does not preclude SARS-Cov-2 infection and should not be used as the sole basis for treatment or other patient management decisions. A negative result may occur with  improper specimen collection/handling, submission of specimen other than nasopharyngeal swab, presence of viral mutation(s) within the areas targeted by this assay, and inadequate number of viral copies(<138 copies/mL). A negative result must be combined with clinical observations, patient history, and epidemiological information. The expected result is Negative.  Fact Sheet for Patients:   BloggerCourse.com  Fact Sheet for Healthcare Providers:  SeriousBroker.it  This test is no t yet approved or cleared by the [place] FDA and  has been authorized for detection and/or diagnosis of SARS-CoV-2 by FDA under an Emergency Use Authorization (EUA). This EUA will remain  in effect (meaning this test can be used) for the duration of the COVID-19 declaration under Section 564(b)(1) of the Act, 21 U.S.C.section 360bbb-3(b)(1), unless the authorization is terminated  or revoked sooner.       Influenza A by PCR NEGATIVE NEGATIVE Final   Influenza B by PCR NEGATIVE NEGATIVE Final    Comment: (NOTE) The Xpert Xpress SARS-CoV-2/FLU/RSV plus assay is intended as an aid in the diagnosis of influenza from Nasopharyngeal swab specimens and should not be used as a sole basis for treatment. Nasal washings and aspirates are unacceptable for Xpert Xpress SARS-CoV-2/FLU/RSV testing.  Fact Sheet for Patients: BloggerCourse.com  Fact Sheet for Healthcare Providers: SeriousBroker.it  This test is not yet approved or cleared by the [place] FDA and has been authorized for detection and/or diagnosis of SARS-CoV-2 by FDA under an Emergency Use Authorization (EUA). This EUA will remain in effect (meaning this test can be used) for the duration of the COVID-19 declaration under Section 564(b)(1) of the Act, 21 U.S.C. section 360bbb-3(b)(1), unless the authorization is terminated or revoked.  Performed at [facility], [address], [place], [place] [address]   Urine Culture     Status: Abnormal   Collection Time: 12/23/21  3:43 PM   Specimen: Urine, Clean Catch  Result Value Ref Range Status   Specimen Description URINE, CLEAN CATCH  Final   Special Requests   Final    NONE Performed at [facility], [address], [place], [place] [address]     Culture (A)  Final    40,000 COLONIES/mL METHICILLIN RESISTANT STAPHYLOCOCCUS AUREUS 20,000 COLONIES/mL YEAST    Report Status 12/26/2021 FINAL  Final   Organism ID, Bacteria METHICILLIN RESISTANT STAPHYLOCOCCUS AUREUS (A)  Final      Susceptibility   Methicillin resistant staphylococcus aureus - MIC*    CIPROFLOXACIN >=8 RESISTANT Resistant     GENTAMICIN <=0.5 SENSITIVE Sensitive     NITROFURANTOIN <=16 SENSITIVE Sensitive     OXACILLIN >=4 RESISTANT Resistant     TETRACYCLINE >=16 RESISTANT Resistant     VANCOMYCIN <=0.5 SENSITIVE Sensitive     TRIMETH/SULFA >=  320 RESISTANT Resistant     CLINDAMYCIN <=0.25 SENSITIVE Sensitive     RIFAMPIN <=0.5 SENSITIVE Sensitive     Inducible Clindamycin NEGATIVE Sensitive     * 40,000 COLONIES/mL METHICILLIN RESISTANT STAPHYLOCOCCUS AUREUS  Surgical PCR screen     Status: Abnormal   Collection Time: 12/23/21  5:17 PM   Specimen: Nasal Mucosa; Nasal Swab  Result Value Ref Range Status   MRSA, PCR POSITIVE (A) NEGATIVE Final    Comment: RESULT SENT TO [facility]  12/23/21   Staphylococcus aureus POSITIVE (A) NEGATIVE Final    Comment: (NOTE) The Xpert SA Assay (FDA approved for NASAL specimens in patients 30 years of age and older), is one component of a comprehensive surveillance program. It is not intended to diagnose infection nor to guide or monitor treatment. Performed at [facility], [address] [place], [place] [address]   Aerobic/Anaerobic Culture w Gram Stain (surgical/deep wound)     Status: None (Preliminary result)   Collection Time: 12/23/21  8:28 PM   Specimen: Abscess  Result Value Ref Range Status   Specimen Description ABSCESS  Final   Special Requests LEFT BUTTOCK  Final   Gram Stain   Final    MODERATE WBC PRESENT,BOTH PMN AND MONONUCLEAR MODERATE GRAM POSITIVE COCCI    Culture   Final    ABUNDANT METHICILLIN RESISTANT STAPHYLOCOCCUS AUREUS HOLDING FOR POSSIBLE ANAEROBE Performed at [facility], [address], [place], [place] [address]    Report Status PENDING  Incomplete   Organism ID, Bacteria METHICILLIN RESISTANT STAPHYLOCOCCUS AUREUS  Final      Susceptibility   Methicillin resistant staphylococcus aureus - MIC*    CIPROFLOXACIN 4 RESISTANT Resistant     ERYTHROMYCIN >=8 RESISTANT Resistant     GENTAMICIN <=0.5 SENSITIVE Sensitive     OXACILLIN >=4 RESISTANT Resistant     TETRACYCLINE >=16 RESISTANT Resistant     VANCOMYCIN <=0.5 SENSITIVE Sensitive     TRIMETH/SULFA 160 RESISTANT Resistant     CLINDAMYCIN <=0.25 SENSITIVE Sensitive     RIFAMPIN <=0.5 SENSITIVE Sensitive     Inducible Clindamycin NEGATIVE Sensitive     * ABUNDANT METHICILLIN RESISTANT STAPHYLOCOCCUS AUREUS    RADIOLOGY STUDIES/RESULTS: No results found.   LOS: 4 days   Jeoffrey Massed, MD  [facility]    To contact the attending provider between 7A-7P or the covering provider during after hours 7P-7A, please log into the web site [email or web address] and access using universal [place] password for that web site. If you do not have the password, please call the hospital operator.  12/27/2021, 11:33 AM

## 2021-12-27 NOTE — Progress Notes (Signed)
Patient ID: Katrina Montes, female   DOB: [birth year], 51 y.o.   MRN: [id number] [facility] Surgery Progress Note  4 Days Post-Op  Subjective: CC-  Feels about the same as yesterday. Continues to have some pain at surgical site. Also continues to have some drainage. WBC down 12.9, afebrile  Objective: Vital signs in last 24 hours: Temp:  [97.7 F (36.5 C)-98.7 F (37.1 C)] 97.8 F (36.6 C) (01/10 0804) Pulse Rate:  [88-96] 88 (01/10 0804) Resp:  [16-20] 20 (01/10 0804) BP: (113-135)/(80-94) 131/80 (01/10 0804) SpO2:  [95 %-99 %] 96 % (01/10 0804) Last BM Date: 12/22/21  Intake/Output from previous day: 01/09 0701 - 01/10 0700 In: 240 [P.O.:240] Out: 450 [Urine:450] Intake/Output this shift: No intake/output data recorded.  PE: Gen:  Alert, NAD, pleasant Pulm:  rate and effort normal GU: buttock wound with penrose present and some purulent drainage, persistent induration, no fluctuance  Lab Results:  Recent Labs    12/26/21 0212 12/27/21 0111  WBC 13.2* 12.9*  HGB 9.1* 9.5*  HCT 29.7* 30.4*  PLT 464* 453*   BMET Recent Labs    12/25/21 0057 12/26/21 0212  NA 137 136  K 3.3* 4.1  CL 101 104  CO2 26 25  GLUCOSE 144* 127*  BUN 13 13  CREATININE 0.58 0.60  CALCIUM 8.4* 8.5*   PT/INR No results for input(s): LABPROT, INR in the last 72 hours. CMP     Component Value Date/Time   NA 136 12/26/2021 0212   NA 137 07/19/2020 0813   K 4.1 12/26/2021 0212   CL 104 12/26/2021 0212   CO2 25 12/26/2021 0212   GLUCOSE 127 (H) 12/26/2021 0212   BUN 13 12/26/2021 0212   BUN 12 07/19/2020 0813   CREATININE 0.60 12/26/2021 0212   CREATININE 0.67 11/19/2020 0931   CALCIUM 8.5 (L) 12/26/2021 0212   PROT 8.4 (H) 12/22/2021 1823   PROT 7.2 07/19/2020 0813   ALBUMIN 3.7 12/22/2021 1823   ALBUMIN 3.8 07/19/2020 0813   AST 10 (L) 12/22/2021 1823   ALT 10 12/22/2021 1823   ALKPHOS 157 (H) 12/22/2021 1823   BILITOT 0.8 12/22/2021 1823   BILITOT 0.4  07/19/2020 0813   GFRNONAA >60 12/26/2021 0212   GFRAA 120 07/19/2020 0813   Lipase  No results found for: LIPASE     Studies/Results: No results found.  Anti-infectives: Anti-infectives (From admission, onward)    Start     Dose/Rate Route Frequency Ordered Stop   12/25/21 2200  vancomycin (VANCOREADY) IVPB 2000 mg/400 mL        2,000 mg 200 mL/hr over 120 Minutes Intravenous Every 24 hours 12/25/21 0829     12/24/21 1130  fluconazole (DIFLUCAN) IVPB 100 mg        100 mg 50 mL/hr over 60 Minutes Intravenous Every 24 hours 12/24/21 1040 12/29/21 1129   12/24/21 0100  vancomycin (VANCOREADY) IVPB 1750 mg/350 mL  Status:  Discontinued        1,750 mg 175 mL/hr over 120 Minutes Intravenous Every 24 hours 12/23/21 2358 12/25/21 0829   12/23/21 2130  vancomycin (VANCOREADY) IVPB 1750 mg/350 mL  Status:  Discontinued        1,750 mg 175 mL/hr over 120 Minutes Intravenous Every 24 hours 12/22/21 2009 12/23/21 2358   12/23/21 1959  metroNIDAZOLE (FLAGYL) 500 MG/100ML IVPB       Note to Pharmacy: Shanda Bumps M: cabinet override      12/23/21 1959 12/23/21 2019  12/23/21 1845  metroNIDAZOLE (FLAGYL) IVPB 500 mg        500 mg 100 mL/hr over 60 Minutes Intravenous  Once 12/23/21 1844 12/23/21 2018   12/23/21 1800  fluconazole (DIFLUCAN) tablet 150 mg  Status:  Discontinued        150 mg Oral Daily 12/23/21 1522 12/23/21 2032   12/23/21 0400  ceFEPIme (MAXIPIME) 2 g in sodium chloride 0.9 % 100 mL IVPB        2 g 200 mL/hr over 30 Minutes Intravenous Every 8 hours 12/22/21 2223 12/30/21 0559   12/22/21 2130  vancomycin (VANCOREADY) IVPB 500 mg/100 mL  Status:  Discontinued       See Hyperspace for full Linked Orders Report.   500 mg 100 mL/hr over 60 Minutes Intravenous  Once 12/22/21 2008 12/22/21 2026   12/22/21 2030  vancomycin (VANCOCIN) IVPB 1000 mg/200 mL premix  Status:  Discontinued       See Hyperspace for full Linked Orders Report.   1,000 mg 200 mL/hr over 60  Minutes Intravenous  Once 12/22/21 1915 12/22/21 2008   12/22/21 2030  vancomycin (VANCOREADY) IVPB 500 mg/100 mL  Status:  Discontinued       See Hyperspace for full Linked Orders Report.   500 mg 100 mL/hr over 60 Minutes Intravenous  Once 12/22/21 1915 12/22/21 2008   12/22/21 2030  vancomycin (VANCOCIN) IVPB 1000 mg/200 mL premix       See Hyperspace for full Linked Orders Report.   1,000 mg 200 mL/hr over 60 Minutes Intravenous  Once 12/22/21 2008 12/22/21 2237   12/22/21 2030  vancomycin (VANCOCIN) 500 mg in sodium chloride 0.9 % 500 mL IVPB        500 mg 250 mL/hr over 120 Minutes Intravenous  Once 12/22/21 2024 12/23/21 0057   12/22/21 2030  vancomycin (VANCOCIN) 500 MG powder       Note to Pharmacy: Ivonne Andrew A: cabinet override      12/22/21 2030 12/23/21 0844   12/22/21 1915  vancomycin (VANCOCIN) 2,500 mg in sodium chloride 0.9 % 500 mL IVPB  Status:  Discontinued        2,500 mg 250 mL/hr over 120 Minutes Intravenous  Once 12/22/21 1910 12/22/21 1913   12/22/21 1915  vancomycin (VANCOCIN) IVPB 1000 mg/200 mL premix       See Hyperspace for full Linked Orders Report.   1,000 mg 200 mL/hr over 60 Minutes Intravenous  Once 12/22/21 1915 12/22/21 2131   12/22/21 1830  ceFEPIme (MAXIPIME) 2 g in sodium chloride 0.9 % 100 mL IVPB        2 g 200 mL/hr over 30 Minutes Intravenous  Once 12/22/21 1824 12/22/21 2004   12/22/21 1830  metroNIDAZOLE (FLAGYL) IVPB 500 mg        500 mg 100 mL/hr over 60 Minutes Intravenous  Once 12/22/21 1824 12/22/21 2045        Assessment/Plan  POD#4 S/p I&D Left buttock abscess and candidiasis 1/6 Dr. Donell Beers - culture MRSA - WBC trending down, afebrile - continue antibiotics - dry dressing PRN saturation - shower with wound open - may be ready for discharge in a day or so  ID - currently maxipime/ vancomycin/ fluconazole FEN - CM diet VTE - lovenox Foley - none  Perineal/vaginal candidiasis Emphysematous cystitis - Ucx 40K MRSA  and yeast, on IV fluconazole and topical antifungals MS DM HTN OSA Morbid obesity BMI 45.94   LOS: 4 days    Katrina Montes A  Katrina Montes, [facility] Surgery 12/27/2021, 10:02 AM Please see Amion for pager number during day hours 7:00am-4:30pm

## 2021-12-27 NOTE — TOC Benefit Eligibility Note (Signed)
Patient Teacher, English as a foreign language completed.    The patient is currently admitted and upon discharge could be taking linezolid (Zyvox) 600 mg.  The current 30 day co-pay is, $18.49.   The patient is insured through [place], [place] Patient Advocate Specialist [place] Patient Advocate Team Direct Number: [phone]  Fax: [phone]

## 2021-12-28 DIAGNOSIS — L0231 Cutaneous abscess of buttock: Secondary | ICD-10-CM | POA: Diagnosis not present

## 2021-12-28 DIAGNOSIS — I10 Essential (primary) hypertension: Secondary | ICD-10-CM | POA: Diagnosis not present

## 2021-12-28 DIAGNOSIS — N3080 Other cystitis without hematuria: Secondary | ICD-10-CM | POA: Diagnosis not present

## 2021-12-28 DIAGNOSIS — R9431 Abnormal electrocardiogram [ECG] [EKG]: Secondary | ICD-10-CM | POA: Diagnosis not present

## 2021-12-28 LAB — GLUCOSE, CAPILLARY
Glucose-Capillary: 194 mg/dL — ABNORMAL HIGH (ref 70–99)
Glucose-Capillary: 202 mg/dL — ABNORMAL HIGH (ref 70–99)
Glucose-Capillary: 162 mg/dL — ABNORMAL HIGH (ref 70–99)
Glucose-Capillary: 81 mg/dL (ref 70–99)

## 2021-12-28 LAB — CBC
HCT: 30.6 % — ABNORMAL LOW (ref 36.0–46.0)
Hemoglobin: 9.7 g/dL — ABNORMAL LOW (ref 12.0–15.0)
MCH: 26.1 pg (ref 26.0–34.0)
MCHC: 31.7 g/dL (ref 30.0–36.0)
MCV: 82.3 fL (ref 80.0–100.0)
Platelets: 481 10*3/uL — ABNORMAL HIGH (ref 150–400)
RBC: 3.72 MIL/uL — ABNORMAL LOW (ref 3.87–5.11)
RDW: 14.7 % (ref 11.5–15.5)
WBC: 10.9 10*3/uL — ABNORMAL HIGH (ref 4.0–10.5)
nRBC: 0 % (ref 0.0–0.2)

## 2021-12-28 MED ORDER — POLYETHYLENE GLYCOL 3350 17 G PO PACK
17.0000 g | PACK | Freq: Two times a day (BID) | ORAL | Status: DC
  Administered 2021-12-28 – 2021-12-29 (×4): 17 g via ORAL
  Filled 2021-12-28 (×3): qty 1

## 2021-12-28 MED ORDER — BISACODYL 10 MG RE SUPP
10.0000 mg | Freq: Once | RECTAL | Status: DC
  Filled 2021-12-28: qty 1

## 2021-12-28 NOTE — Evaluation (Signed)
Physical Therapy Evaluation Patient Details Name: Katrina Montes MRN: [id number] DOB: 05/08/1971 Today's Date: 12/28/2021  History of Present Illness  51 y/o female presented to ED on 12/22/21 for evaluation of abscess to L buttocks. CT showed L buttock cellulitis and found to also ave emphysematous cystitis. S/p I&D L buttock abscess on 1/6. PMH: diabetes, HTN, MS, optic neuritis  Clinical Impression  Patient admitted with above diagnosis. Patient presents with generalized weakness and decreased activity tolerance. Patient at supervision level for mobility with use of RW. Patient has to be able to negotiate flight of stairs (28 stairs per patient) to access her room at home. Parents are unable to physically assist her. Patient will benefit from skilled PT services during acute stay to address listed deficits. Recommend HHPT at discharge to address activity tolerance and stairs in home to build confidence for patient. Patient would also benefit from a [facility] aide to assist with initial bathing/dressing once home.        Recommendations for follow up therapy are one component of a multi-disciplinary discharge planning process, led by the attending physician.  Recommendations may be updated based on patient status, additional functional criteria and insurance authorization.  Follow Up Recommendations Home health PT    Assistance Recommended at Discharge Set up Supervision/Assistance  Patient can return home with the following  A little help with bathing/dressing/bathroom;Help with stairs or ramp for entrance    Equipment Recommendations Rolling Iley Deignan (2 wheels)  Recommendations for Other Services       Functional Status Assessment Patient has had a recent decline in their functional status and demonstrates the ability to make significant improvements in function in a reasonable and predictable amount of time.     Precautions / Restrictions Precautions Precautions:  Fall Restrictions Weight Bearing Restrictions: No      Mobility  Bed Mobility Overal bed mobility: Modified Independent                  Transfers Overall transfer level: Needs assistance Equipment used: Rolling Jannett Schmall (2 wheels) Transfers: Sit to/from Stand Sit to Stand: Supervision                Ambulation/Gait Ambulation/Gait assistance: Supervision Gait Distance (Feet): 200 Feet Assistive device: Rolling Kenneshia Rehm (2 wheels) Gait Pattern/deviations: Step-through pattern;Trunk flexed Gait velocity: decreased     General Gait Details: supervision for safety. Easily fatigued requiring standing rest break x 4 during ambulation  Stairs            Wheelchair Mobility    Modified Rankin (Stroke Patients Only)       Balance Overall balance assessment: Mild deficits observed, not formally tested                                           Pertinent Vitals/Pain Pain Assessment: 0-10 Pain Score: 7  Pain Location: L buttocks Pain Descriptors / Indicators: Grimacing;Discomfort Pain Intervention(s): Monitored during session    Home Living Family/patient expects to be discharged to:: Private residence Living Arrangements: Parent (mom and dad) Available Help at Discharge: Family;Available PRN/intermittently Type of Home: House       Alternate Level Stairs-Number of Steps: flight - patient states 28 stairs Home Layout: Two level Home Equipment: None Additional Comments: patient lives on second floor and has bathroom. patient needs to be able to negotiate flight of stairs to access her room. Mother and  father cannot assist her physically nor climb stairs    Prior Function Prior Level of Function : Independent/Modified Independent                     Hand Dominance        Extremity/Trunk Assessment   Upper Extremity Assessment Upper Extremity Assessment: Overall WFL for tasks assessed    Lower Extremity  Assessment Lower Extremity Assessment: Generalized weakness    Cervical / Trunk Assessment Cervical / Trunk Assessment: Kyphotic  Communication   Communication: No difficulties  Cognition Arousal/Alertness: Awake/alert Behavior During Therapy: WFL for tasks assessed/performed Overall Cognitive Status: Within Functional Limits for tasks assessed                                          General Comments      Exercises     Assessment/Plan    PT Assessment Patient needs continued PT services  PT Problem List Decreased strength;Decreased activity tolerance;Decreased balance;Decreased mobility       PT Treatment Interventions DME instruction;Gait training;Functional mobility training;Stair training;Therapeutic activities;Therapeutic exercise;Balance training;Patient/family education    PT Goals (Current goals can be found in the Care Plan section)  Acute Rehab PT Goals Patient Stated Goal: to go home PT Goal Formulation: With patient Time For Goal Achievement: 01/11/22 Potential to Achieve Goals: Good    Frequency Min 3X/week     Co-evaluation               AM-PAC PT "6 Clicks" Mobility  Outcome Measure Help needed turning from your back to your side while in a flat bed without using bedrails?: None Help needed moving from lying on your back to sitting on the side of a flat bed without using bedrails?: None Help needed moving to and from a bed to a chair (including a wheelchair)?: A Little Help needed standing up from a chair using your arms (e.g., wheelchair or bedside chair)?: A Little Help needed to walk in hospital room?: A Little Help needed climbing 3-5 steps with a railing? : A Little 6 Click Score: 20    End of Session   Activity Tolerance: Patient tolerated treatment well Patient left: in bed;with call bell/phone within reach;with family/visitor present Nurse Communication: Mobility status PT Visit Diagnosis: Unsteadiness on feet  (R26.81);Muscle weakness (generalized) (M62.81)    Time: [id number] PT Time Calculation (min) (ACUTE ONLY): 40 min   Charges:   PT Evaluation $PT Eval Moderate Complexity: 1 Mod PT Treatments $Therapeutic Activity: 23-37 mins        Dalores Weger A. Gilford Rile PT, DPT Acute Rehabilitation Services Pager [phone] Office [phone]   Linna Hoff 12/28/2021, 5:31 PM

## 2021-12-28 NOTE — Progress Notes (Signed)
PROGRESS NOTE        PATIENT DETAILS Name: Katrina Montes Age: 51 y.o. Sex: female Date of Birth: [birth year] Admit Date: 12/22/2021 Admitting Physician Carollee Herter, DO PCP:Pcp, No  Brief Narrative: Patient is a 51 y.o. female with history of poorly controlled poorly controlled DM-2, HTN, MS-presented with left gluteal abscess-and hyperglycemic hyperosmolar state-started on broad-spectrum antibiotics/IV insulin-evaluated by general surgery and underwent I&D.  Patient was subsequently admitted to the hospitalist service.  See below for further details.  Subjective: Complains of some soreness in the perineal area.  Objective: Vitals: Blood pressure 117/78, pulse 86, temperature 97.9 F (36.6 C), temperature source Oral, resp. rate 20, height 5\' 6"  (1.676 m), weight 129.1 kg, last menstrual period 12/12/2021, SpO2 97 %.   Exam: Gen Exam:Alert awake-not in any distress HEENT:atraumatic, normocephalic Chest: B/L clear to auscultation anteriorly CVS:S1S2 regular Abdomen:soft non tender, non distended Extremities:no edema Neurology: Non focal Skin: no rash   Pertinent Labs/Radiology: Recent Labs  Lab 12/22/21 1823 12/23/21 1240 12/26/21 0212 12/27/21 0111 12/28/21 1021  WBC 27.2*   < > 13.2*   < > 10.9*  HGB 11.6*   < > 9.1*   < > 9.7*  PLT 444*   < > 464*   < > 481*  NA 131*   < > 136  --   --   K 3.4*   < > 4.1  --   --   CREATININE 1.06*   < > 0.60  --   --   AST 10*  --   --   --   --   ALT 10  --   --   --   --   ALKPHOS 157*  --   --   --   --   BILITOT 0.8  --   --   --   --    < > = values in this interval not displayed.     Assessment/Plan: Left gluteal abscess due to MRSA: S/p I&D on 1/6-wound cultures positive for MRSA-General surgery following-discussed with ID MD Dr. 02/25/22 on 1/10-has been transitioned to Zyvox.  Per general surgery-wound still indurated on exam today-recommending continued inpatient monitoring.    Emphysematous cystitis: Improved-afebrile-leukocytosis almost resolved-urine culture positive for MRSA/yeast-after discussion with ID-transition to just Zyvox.  Previously on cefepime and fluconazole.   Non ketotic Hyperosmolar hyperglycemic state: No longer on IV insulin-transitioned to SQ insulin  DM-2 uncontrolled (A1c 12.4 on 1/6): Per patient/sister at bedside-A1c has been easily more than 10 for the past several years.  Apparently takes oral hypoglycemic agents and Basaglar 30 units daily-however for the past several months has been noncompliant to insulin as she was in the donut hole.  CBGs have significantly improved-continue Levemir 22 units daily, Premeal NovoLog of 6 units and SSI.    Recent Labs    12/27/21 1607 12/27/21 2023 12/28/21 0842  GLUCAP 84 213* 194*    Perineal/vaginal candidiasis: Continue topical antifungals.  Prolonged QTC: Likely due to hypokalemia-resolved on last EKG.  Watch closely   Hypokalemia: Repleted.  Hypomagnesemia: Repleted.  HTN: BP stable-continue lisinopril-hold HCTZ for now.  History of multiple sclerosis: Stable-on ocrelizumab injection every 6 months.  Mood disorder: Stable-continue Cymbalta  History of migraine headaches: Continue Topamax.  OSA: CPAP nightly  Morbid Obesity Estimated body mass index is 45.94 kg/m as calculated from the following:  Height as of this encounter:  (1.676 m).   Weight as of this encounter: 129.1 kg.   Procedures: 1/6>> I&D of left gluteal abscess. Consults: General surgery DVT Prophylaxis: Lovenox Code Status:Full code  Family Communication:None at bedside.   Disposition Plan: Status is: Inpatient  Remains inpatient appropriate because: Gluteal abscess-s/p I&D on 1/6-emphysematous cystitis-significant leukocytosis persists-not yet stable for discharge needs continued IV antimicrobial therapy.        Diet: Diet Order             Diet Carb Modified Fluid consistency: Thin; Room  service appropriate? Yes  Diet effective now                     Antimicrobial agents: Anti-infectives (From admission, onward)    Start     Dose/Rate Route Frequency Ordered Stop   12/27/21 1245  linezolid (ZYVOX) tablet 600 mg        600 mg Oral Every 12 hours 12/27/21 1154 01/02/22 2359   12/25/21 2200  vancomycin (VANCOREADY) IVPB 2000 mg/400 mL  Status:  Discontinued        2,000 mg 200 mL/hr over 120 Minutes Intravenous Every 24 hours 12/25/21 0829 12/27/21 1154   12/24/21 1130  fluconazole (DIFLUCAN) IVPB 100 mg  Status:  Discontinued        100 mg 50 mL/hr over 60 Minutes Intravenous Every 24 hours 12/24/21 1040 12/27/21 1155   12/24/21 0100  vancomycin (VANCOREADY) IVPB 1750 mg/350 mL  Status:  Discontinued        1,750 mg 175 mL/hr over 120 Minutes Intravenous Every 24 hours 12/23/21 2358 12/25/21 0829   12/23/21 2130  vancomycin (VANCOREADY) IVPB 1750 mg/350 mL  Status:  Discontinued        1,750 mg 175 mL/hr over 120 Minutes Intravenous Every 24 hours 12/22/21 2009 12/23/21 2358   12/23/21 1959  metroNIDAZOLE (FLAGYL) 500 MG/100ML IVPB       Note to Pharmacy: Shanda Bumps M: cabinet override      12/23/21 1959 12/23/21 2019   12/23/21 1845  metroNIDAZOLE (FLAGYL) IVPB 500 mg        500 mg 100 mL/hr over 60 Minutes Intravenous  Once 12/23/21 1844 12/23/21 2018   12/23/21 1800  fluconazole (DIFLUCAN) tablet 150 mg  Status:  Discontinued        150 mg Oral Daily 12/23/21 1522 12/23/21 2032   12/23/21 0400  ceFEPIme (MAXIPIME) 2 g in sodium chloride 0.9 % 100 mL IVPB  Status:  Discontinued        2 g 200 mL/hr over 30 Minutes Intravenous Every 8 hours 12/22/21 2223 12/27/21 1154   12/22/21 2130  vancomycin (VANCOREADY) IVPB 500 mg/100 mL  Status:  Discontinued       See Hyperspace for full Linked Orders Report.   500 mg 100 mL/hr over 60 Minutes Intravenous  Once 12/22/21 2008 12/22/21 2026   12/22/21 2030  vancomycin (VANCOCIN) IVPB 1000 mg/200 mL premix   Status:  Discontinued       See Hyperspace for full Linked Orders Report.   1,000 mg 200 mL/hr over 60 Minutes Intravenous  Once 12/22/21 1915 12/22/21 2008   12/22/21 2030  vancomycin (VANCOREADY) IVPB 500 mg/100 mL  Status:  Discontinued       See Hyperspace for full Linked Orders Report.   500 mg 100 mL/hr over 60 Minutes Intravenous  Once 12/22/21 1915 12/22/21 2008   12/22/21 2030  vancomycin (VANCOCIN) IVPB 1000 mg/200  mL premix       See Hyperspace for full Linked Orders Report.   1,000 mg 200 mL/hr over 60 Minutes Intravenous  Once 12/22/21 2008 12/22/21 2237   12/22/21 2030  vancomycin (VANCOCIN) 500 mg in sodium chloride 0.9 % 500 mL IVPB        500 mg 250 mL/hr over 120 Minutes Intravenous  Once 12/22/21 2024 12/23/21 0057   12/22/21 2030  vancomycin (VANCOCIN) 500 MG powder       Note to Pharmacy: Ivonne Andrew A: cabinet override      12/22/21 2030 12/23/21 0844   12/22/21 1915  vancomycin (VANCOCIN) 2,500 mg in sodium chloride 0.9 % 500 mL IVPB  Status:  Discontinued        2,500 mg 250 mL/hr over 120 Minutes Intravenous  Once 12/22/21 1910 12/22/21 1913   12/22/21 1915  vancomycin (VANCOCIN) IVPB 1000 mg/200 mL premix       See Hyperspace for full Linked Orders Report.   1,000 mg 200 mL/hr over 60 Minutes Intravenous  Once 12/22/21 1915 12/22/21 2131   12/22/21 1830  ceFEPIme (MAXIPIME) 2 g in sodium chloride 0.9 % 100 mL IVPB        2 g 200 mL/hr over 30 Minutes Intravenous  Once 12/22/21 1824 12/22/21 2004   12/22/21 1830  metroNIDAZOLE (FLAGYL) IVPB 500 mg        500 mg 100 mL/hr over 60 Minutes Intravenous  Once 12/22/21 1824 12/22/21 2045        MEDICATIONS: Scheduled Meds:  bisacodyl  10 mg Rectal Once   Chlorhexidine Gluconate Cloth  6 each Topical Daily   clotrimazole   Topical BID   docusate sodium  50 mg Oral BID   DULoxetine  60 mg Oral Daily   enoxaparin (LOVENOX) injection  60 mg Subcutaneous Q24H   ferrous sulfate  325 mg Oral Q breakfast    insulin aspart  0-15 Units Subcutaneous TID WC   insulin aspart  6 Units Subcutaneous TID WC   insulin detemir  22 Units Subcutaneous Daily   linezolid  600 mg Oral Q12H   lisinopril  20 mg Oral Daily   mouth rinse  15 mL Mouth Rinse Once   multivitamin with minerals  1 tablet Oral Daily   mupirocin ointment  1 application Nasal BID   polyethylene glycol  17 g Oral BID   topiramate  100 mg Oral QHS   Continuous Infusions:   PRN Meds:.acetaminophen **OR** acetaminophen, cyclobenzaprine, dextrose, HYDROmorphone (DILAUDID) injection, lip balm, oxyCODONE   I have personally reviewed following labs and imaging studies  LABORATORY DATA: CBC: Recent Labs  Lab 12/22/21 1823 12/23/21 1825 12/24/21 0126 12/25/21 0057 12/26/21 0212 12/27/21 0111 12/28/21 1021  WBC 27.2*   < > 25.6* 16.6* 13.2* 12.9* 10.9*  NEUTROABS 23.7*  --   --   --   --   --   --   HGB 11.6*   < > 9.7* 9.1* 9.1* 9.5* 9.7*  HCT 35.7*   < > 30.7* 28.0* 29.7* 30.4* 30.6*  MCV 81.5   < > 82.5 82.6 83.2 83.3 82.3  PLT 444*   < > 363 384 464* 453* 481*   < > = values in this interval not displayed.     Basic Metabolic Panel: Recent Labs  Lab 12/23/21 1240 12/23/21 1739 12/23/21 1825 12/23/21 2240 12/24/21 0126 12/25/21 0057 12/26/21 0212  NA 136 135 135  --  134* 137 136  K 2.9* 3.0*  4.0  --  3.3* 3.3* 4.1  CL 100 101 103  --  101 101 104  CO2 29 26  --   --  GLUCOSE 173* 172* 173*  --  244* 144* 127*  BUN --  CREATININE 0.56 0.52 0.30*  --  0.59 0.58 0.60  CALCIUM 8.5* 8.3*  --   --  8.4* 8.4* 8.5*  MG  --   --   --  1.6*  --  1.7 2.1     GFR: Estimated Creatinine Clearance: 117.1 mL/min (by C-G formula based on SCr of 0.6 mg/dL).  Liver Function Tests: Recent Labs  Lab 12/22/21 1823  AST 10*  ALT 10  ALKPHOS 157*  BILITOT 0.8  PROT 8.4*  ALBUMIN 3.7    No results for input(s): LIPASE, AMYLASE in the last 168 hours. No results for input(s): AMMONIA in the  last 168 hours.  Coagulation Profile: Recent Labs  Lab 12/22/21 1823  INR 1.1     Cardiac Enzymes: No results for input(s): CKTOTAL, CKMB, CKMBINDEX, TROPONINI in the last 168 hours.  BNP (last 3 results) No results for input(s): PROBNP in the last 8760 hours.  Lipid Profile: No results for input(s): CHOL, HDL, LDLCALC, TRIG, CHOLHDL, LDLDIRECT in the last 72 hours.  Thyroid Function Tests: No results for input(s): TSH, T4TOTAL, FREET4, T3FREE, THYROIDAB in the last 72 hours.  Anemia Panel: No results for input(s): VITAMINB12, FOLATE, FERRITIN, TIBC, IRON, RETICCTPCT in the last 72 hours.  Urine analysis:    Component Value Date/Time   COLORURINE AMBER (A) 12/23/2021 1833   APPEARANCEUR HAZY (A) 12/23/2021 1833   LABSPEC 1.015 12/23/2021 1833   PHURINE 5.5 12/23/2021 1833   GLUCOSEU >=500 (A) 12/23/2021 1833   HGBUR LARGE (A) 12/23/2021 1833   HGBUR negative 10/24/2010 0821   BILIRUBINUR SMALL (A) 12/23/2021 1833   KETONESUR NEGATIVE 12/23/2021 1833   PROTEINUR 30 (A) 12/23/2021 1833   UROBILINOGEN 0.2 10/24/2010 0821   NITRITE NEGATIVE 12/23/2021 1833   LEUKOCYTESUR SMALL (A) 12/23/2021 1833    Sepsis Labs: Lactic Acid, Venous    Component Value Date/Time   LATICACIDVEN 1.9 12/22/2021 1823    MICROBIOLOGY: Recent Results (from the past 240 hour(s))  Blood Culture (routine x 2)     Status: None   Collection Time: 12/22/21  6:05 PM   Specimen: BLOOD  Result Value Ref Range Status   Specimen Description   Final    BLOOD LEFT ANTECUBITAL Performed at [facility], [address] [place], [place] [address]    Special Requests   Final    Blood Culture adequate volume BOTTLES DRAWN AEROBIC AND ANAEROBIC Performed at [facility], [address], [place], [place] [address]    Culture   Final    NO GROWTH 5 DAYS Performed at [facility], [address] [place], [place] [address]    Report Status 12/27/2021  FINAL  Final  Blood Culture (routine x 2)     Status: None   Collection Time: 12/22/21  7:00 PM   Specimen: BLOOD  Result Value Ref Range Status   Specimen Description   Final    BLOOD BLOOD RIGHT FOREARM Performed at [facility], [address], [place], [place] [address]    Special Requests   Final    Blood Culture adequate volume BOTTLES DRAWN AEROBIC AND ANAEROBIC Performed at [facility], [address]  [place], [place] [address]    Culture   Final    NO GROWTH 5 DAYS Performed at [facility], [address] [place], [place] [address]    Report Status 12/27/2021 FINAL  Final  Resp Panel by RT-PCR (Flu A&B, Covid) Nasopharyngeal Swab     Status: None   Collection Time: 12/22/21  7:04 PM   Specimen: Nasopharyngeal Swab; Nasopharyngeal(NP) swabs in vial transport medium  Result Value Ref Range Status   SARS Coronavirus 2 by RT PCR NEGATIVE NEGATIVE Final    Comment: (NOTE) SARS-CoV-2 target nucleic acids are NOT DETECTED.  The SARS-CoV-2 RNA is generally detectable in upper respiratory specimens during the acute phase of infection. The lowest concentration of SARS-CoV-2 viral copies this assay can detect is 138 copies/mL. A negative result does not preclude SARS-Cov-2 infection and should not be used as the sole basis for treatment or other patient management decisions. A negative result may occur with  improper specimen collection/handling, submission of specimen other than nasopharyngeal swab, presence of viral mutation(s) within the areas targeted by this assay, and inadequate number of viral copies(<138 copies/mL). A negative result must be combined with clinical observations, patient history, and epidemiological information. The expected result is Negative.  Fact Sheet for Patients:  BloggerCourse.com  Fact Sheet for Healthcare Providers:  SeriousBroker.it  This test  is no t yet approved or cleared by the [place] FDA and  has been authorized for detection and/or diagnosis of SARS-CoV-2 by FDA under an Emergency Use Authorization (EUA). This EUA will remain  in effect (meaning this test can be used) for the duration of the COVID-19 declaration under Section 564(b)(1) of the Act, 21 U.S.C.section 360bbb-3(b)(1), unless the authorization is terminated  or revoked sooner.       Influenza A by PCR NEGATIVE NEGATIVE Final   Influenza B by PCR NEGATIVE NEGATIVE Final    Comment: (NOTE) The Xpert Xpress SARS-CoV-2/FLU/RSV plus assay is intended as an aid in the diagnosis of influenza from Nasopharyngeal swab specimens and should not be used as a sole basis for treatment. Nasal washings and aspirates are unacceptable for Xpert Xpress SARS-CoV-2/FLU/RSV testing.  Fact Sheet for Patients: BloggerCourse.com  Fact Sheet for Healthcare Providers: SeriousBroker.it  This test is not yet approved or cleared by the [place] FDA and has been authorized for detection and/or diagnosis of SARS-CoV-2 by FDA under an Emergency Use Authorization (EUA). This EUA will remain in effect (meaning this test can be used) for the duration of the COVID-19 declaration under Section 564(b)(1) of the Act, 21 U.S.C. section 360bbb-3(b)(1), unless the authorization is terminated or revoked.  Performed at [facility], [address] [place], [place] [address]   Urine Culture     Status: Abnormal   Collection Time: 12/23/21  3:43 PM   Specimen: Urine, Clean Catch  Result Value Ref Range Status   Specimen Description URINE, CLEAN CATCH  Final   Special Requests   Final    NONE Performed at [facility], [address], [place], [place] [address]    Culture (A)  Final    40,000 COLONIES/mL METHICILLIN RESISTANT STAPHYLOCOCCUS AUREUS 20,000 COLONIES/mL YEAST    Report Status 12/26/2021  FINAL  Final   Organism ID, Bacteria METHICILLIN RESISTANT STAPHYLOCOCCUS AUREUS (A)  Final      Susceptibility   Methicillin resistant staphylococcus aureus - MIC*    CIPROFLOXACIN >=8 RESISTANT Resistant     GENTAMICIN <=0.5 SENSITIVE Sensitive  NITROFURANTOIN <=16 SENSITIVE Sensitive     OXACILLIN >=4 RESISTANT Resistant     TETRACYCLINE >=16 RESISTANT Resistant     VANCOMYCIN <=0.5 SENSITIVE Sensitive     TRIMETH/SULFA >=320 RESISTANT Resistant     CLINDAMYCIN <=0.25 SENSITIVE Sensitive     RIFAMPIN <=0.5 SENSITIVE Sensitive     Inducible Clindamycin NEGATIVE Sensitive     * 40,000 COLONIES/mL METHICILLIN RESISTANT STAPHYLOCOCCUS AUREUS  Surgical PCR screen     Status: Abnormal   Collection Time: 12/23/21  5:17 PM   Specimen: Nasal Mucosa; Nasal Swab  Result Value Ref Range Status   MRSA, PCR POSITIVE (A) NEGATIVE Final    Comment: RESULT SENT TO [facility]  12/23/21   Staphylococcus aureus POSITIVE (A) NEGATIVE Final    Comment: (NOTE) The Xpert SA Assay (FDA approved for NASAL specimens in patients 16 years of age and older), is one component of a comprehensive surveillance program. It is not intended to diagnose infection nor to guide or monitor treatment. Performed at [facility], [address], [place], [place] [address]   Aerobic/Anaerobic Culture w Gram Stain (surgical/deep wound)     Status: None   Collection Time: 12/23/21  8:28 PM   Specimen: Abscess  Result Value Ref Range Status   Specimen Description ABSCESS  Final   Special Requests LEFT BUTTOCK  Final   Gram Stain   Final    MODERATE WBC PRESENT,BOTH PMN AND MONONUCLEAR MODERATE GRAM POSITIVE COCCI    Culture   Final    ABUNDANT METHICILLIN RESISTANT STAPHYLOCOCCUS AUREUS RARE PREVOTELLA BIVIA BETA LACTAMASE POSITIVE Performed at [facility], [address] [place], [place] [address]    Report Status 12/27/2021 FINAL  Final   Organism ID, Bacteria METHICILLIN RESISTANT  STAPHYLOCOCCUS AUREUS  Final      Susceptibility   Methicillin resistant staphylococcus aureus - MIC*    CIPROFLOXACIN 4 RESISTANT Resistant     ERYTHROMYCIN >=8 RESISTANT Resistant     GENTAMICIN <=0.5 SENSITIVE Sensitive     OXACILLIN >=4 RESISTANT Resistant     TETRACYCLINE >=16 RESISTANT Resistant     VANCOMYCIN <=0.5 SENSITIVE Sensitive     TRIMETH/SULFA 160 RESISTANT Resistant     CLINDAMYCIN <=0.25 SENSITIVE Sensitive     RIFAMPIN <=0.5 SENSITIVE Sensitive     Inducible Clindamycin NEGATIVE Sensitive     * ABUNDANT METHICILLIN RESISTANT STAPHYLOCOCCUS AUREUS    RADIOLOGY STUDIES/RESULTS: No results found.   LOS: 5 days   Jeoffrey Massed, MD  [facility]    To contact the attending provider between 7A-7P or the covering provider during after hours 7P-7A, please log into the web site [email or web address] and access using universal [place] password for that web site. If you do not have the password, please call the hospital operator.  12/28/2021, 11:54 AM

## 2021-12-28 NOTE — TOC Initial Note (Signed)
Transition of Care [facility]) - Initial/Assessment Note    Patient Details  Name: Katrina Montes MRN: [id number] Date of Birth: [birth year]  [facility]) CM/SW Contact:    Harriet Masson, RN Phone Number: 12/28/2021, 4:16 PM  Clinical Narrative:                 Spoke to patient regarding PCP. I made apt for patient at [facility] for a PCP to help with her uncontrolled DM and affording her medications. Apt info is on the AVS TOC will continue to follow   Expected Discharge Plan: Home/Self Care Barriers to Discharge: Continued Medical Work up   Patient Goals and CMS Choice Patient states their goals for this hospitalization and ongoing recovery are:: return home      Expected Discharge Plan and Services Expected Discharge Plan: Home/Self Care       Living arrangements for the past 2 months: Single Family Home                                      Prior Living Arrangements/Services Living arrangements for the past 2 months: Single Family Home   Patient language and need for interpreter reviewed:: Yes        Need for Family Participation in Patient Care: Yes (Comment) Care giver support system in place?: Yes (comment)   Criminal Activity/Legal Involvement Pertinent to Current Situation/Hospitalization: No - Comment as needed  Activities of Daily Living Home Assistive Devices/Equipment: None ADL Screening (condition at time of admission) Patient's cognitive ability adequate to safely complete daily activities?: Yes Is the patient deaf or have difficulty hearing?: No Does the patient have difficulty seeing, even when wearing glasses/contacts?: No Does the patient have difficulty concentrating, remembering, or making decisions?: No Patient able to express need for assistance with ADLs?: Yes Does the patient have difficulty dressing or bathing?: No Independently performs ADLs?: Yes (appropriate for developmental age) Does the patient have difficulty  walking or climbing stairs?: Yes Weakness of Legs: Both Weakness of Arms/Hands: None  Permission Sought/Granted                  Emotional Assessment Appearance:: Appears stated age Attitude/Demeanor/Rapport: Engaged Affect (typically observed): Accepting Orientation: : Oriented to Self, Oriented to Place, Oriented to  Time, Oriented to Situation Alcohol / Substance Use: Not Applicable Psych Involvement: No (comment)  Admission diagnosis:  Hyperglycemia [R73.9] Abscess, gluteal, left [L02.31] Left buttock abscess [L02.31] Sepsis, due to unspecified organism, unspecified whether acute organ dysfunction present [facility]) [A41.9] Patient Active Problem List   Diagnosis Date Noted   Prolonged QT interval 12/23/2021   Emphysematous cystitis 12/23/2021   Hypokalemia 12/23/2021   Vaginal candidiasis 12/23/2021   Left buttock abscess 12/22/2021   Chronic migraine w/o aura w/o status migrainosus, not intractable 08/01/2021   Fatigue 08/01/2021   Diabetic autonomic neuropathy associated with type 2 diabetes mellitus (HCC) 08/01/2021   Other fatigue 01/19/2020   Bilateral hearing loss 01/19/2020   Carpal tunnel syndrome of right wrist 09/17/2019   OSA (obstructive sleep apnea) 06/04/2019   Tommi Rumps Quervain's disease (radial styloid tenosynovitis) 11/06/2018   Gait abnormality 04/04/2018   Depression 01/29/2017   Migraine without aura and without status migrainosus, not intractable 03/30/2015   Left lumbar radiculopathy 03/09/2015   Right optic neuritis 01/14/2015   Multiple sclerosis (HCC) 01/14/2015   OVARIAN CYST 02/09/2011   Class 3 obesity (HCC) 10/24/2010  Type 2 diabetes mellitus with other specified complication (HCC) 09/22/2010   Vitamin D deficiency 09/22/2010   ANEMIA-IRON DEFICIENCY 09/22/2010   Essential hypertension 09/22/2010   PCP:  Pcp, No Pharmacy:   [id number] - [place], [place] - [address] [place] [address] Phone: [phone] Fax: [phone]  [facility] [address] [place] [address] [place] [address] [place] [address] [place] [address] Phone: [phone] Fax: [phone]  [facility] [address] [place] [address] Phone: [phone] Fax: [phone]     Social Determinants of Health (SDOH) Interventions    Readmission Risk Interventions No flowsheet data found.

## 2021-12-28 NOTE — Progress Notes (Signed)
Patient ID: Katrina Montes, female   DOB: 1971/09/19, 51 y.o.   MRN: [id number] [facility] Surgery Progress Note  5 Days Post-Op  Subjective: CC-  Pain is about the same, but she does report mild worsening of burning perineal pain. Tolerating diet and passing flatus but has not had a BM. Mild nausea at times, no emesis.  Objective: Vital signs in last 24 hours: Temp:  [97.6 F (36.4 C)-98 F (36.7 C)] 97.9 F (36.6 C) (01/11 0835) Pulse Rate:  [86-98] 86 (01/11 0835) Resp:  [17-20] 20 (01/11 0835) BP: (112-132)/(74-93) 117/78 (01/11 0835) SpO2:  [94 %-97 %] 97 % (01/11 0835) Last BM Date: 12/23/21  Intake/Output from previous day: 01/10 0701 - 01/11 0700 In: 840 [P.O.:840] Out: -  Intake/Output this shift: No intake/output data recorded.  PE: Gen:  Alert, NAD, pleasant Pulm:  rate and effort normal GU: buttock wound with penrose present and some purulent drainage, persistent induration fairly stable from yesterday, no fluctuance   Lab Results:  Recent Labs    12/27/21 0111 12/28/21 1021  WBC 12.9* 10.9*  HGB 9.5* 9.7*  HCT 30.4* 30.6*  PLT 453* 481*   BMET Recent Labs    12/26/21 0212  NA 136  K 4.1  CL 104  CO2 25  GLUCOSE 127*  BUN 13  CREATININE 0.60  CALCIUM 8.5*   PT/INR No results for input(s): LABPROT, INR in the last 72 hours. CMP     Component Value Date/Time   NA 136 12/26/2021 0212   NA 137 07/19/2020 0813   K 4.1 12/26/2021 0212   CL 104 12/26/2021 0212   CO2 25 12/26/2021 0212   GLUCOSE 127 (H) 12/26/2021 0212   BUN 13 12/26/2021 0212   BUN 12 07/19/2020 0813   CREATININE 0.60 12/26/2021 0212   CREATININE 0.67 11/19/2020 0931   CALCIUM 8.5 (L) 12/26/2021 0212   PROT 8.4 (H) 12/22/2021 1823   PROT 7.2 07/19/2020 0813   ALBUMIN 3.7 12/22/2021 1823   ALBUMIN 3.8 07/19/2020 0813   AST 10 (L) 12/22/2021 1823   ALT 10 12/22/2021 1823   ALKPHOS 157 (H) 12/22/2021 1823   BILITOT 0.8 12/22/2021 1823   BILITOT 0.4  07/19/2020 0813   GFRNONAA >60 12/26/2021 0212   GFRAA 120 07/19/2020 0813   Lipase  No results found for: LIPASE     Studies/Results: No results found.  Anti-infectives: Anti-infectives (From admission, onward)    Start     Dose/Rate Route Frequency Ordered Stop   12/27/21 1245  linezolid (ZYVOX) tablet 600 mg        600 mg Oral Every 12 hours 12/27/21 1154 01/02/22 2359   12/25/21 2200  vancomycin (VANCOREADY) IVPB 2000 mg/400 mL  Status:  Discontinued        2,000 mg 200 mL/hr over 120 Minutes Intravenous Every 24 hours 12/25/21 0829 12/27/21 1154   12/24/21 1130  fluconazole (DIFLUCAN) IVPB 100 mg  Status:  Discontinued        100 mg 50 mL/hr over 60 Minutes Intravenous Every 24 hours 12/24/21 1040 12/27/21 1155   12/24/21 0100  vancomycin (VANCOREADY) IVPB 1750 mg/350 mL  Status:  Discontinued        1,750 mg 175 mL/hr over 120 Minutes Intravenous Every 24 hours 12/23/21 2358 12/25/21 0829   12/23/21 2130  vancomycin (VANCOREADY) IVPB 1750 mg/350 mL  Status:  Discontinued        1,750 mg 175 mL/hr over 120 Minutes Intravenous Every 24 hours 12/22/21  2009 12/23/21 2358   12/23/21 1959  metroNIDAZOLE (FLAGYL) 500 MG/100ML IVPB       Note to Pharmacy: Shanda Bumps M: cabinet override      12/23/21 1959 12/23/21 2019   12/23/21 1845  metroNIDAZOLE (FLAGYL) IVPB 500 mg        500 mg 100 mL/hr over 60 Minutes Intravenous  Once 12/23/21 1844 12/23/21 2018   12/23/21 1800  fluconazole (DIFLUCAN) tablet 150 mg  Status:  Discontinued        150 mg Oral Daily 12/23/21 1522 12/23/21 2032   12/23/21 0400  ceFEPIme (MAXIPIME) 2 g in sodium chloride 0.9 % 100 mL IVPB  Status:  Discontinued        2 g 200 mL/hr over 30 Minutes Intravenous Every 8 hours 12/22/21 2223 12/27/21 1154   12/22/21 2130  vancomycin (VANCOREADY) IVPB 500 mg/100 mL  Status:  Discontinued       See Hyperspace for full Linked Orders Report.   500 mg 100 mL/hr over 60 Minutes Intravenous  Once 12/22/21 2008  12/22/21 2026   12/22/21 2030  vancomycin (VANCOCIN) IVPB 1000 mg/200 mL premix  Status:  Discontinued       See Hyperspace for full Linked Orders Report.   1,000 mg 200 mL/hr over 60 Minutes Intravenous  Once 12/22/21 1915 12/22/21 2008   12/22/21 2030  vancomycin (VANCOREADY) IVPB 500 mg/100 mL  Status:  Discontinued       See Hyperspace for full Linked Orders Report.   500 mg 100 mL/hr over 60 Minutes Intravenous  Once 12/22/21 1915 12/22/21 2008   12/22/21 2030  vancomycin (VANCOCIN) IVPB 1000 mg/200 mL premix       See Hyperspace for full Linked Orders Report.   1,000 mg 200 mL/hr over 60 Minutes Intravenous  Once 12/22/21 2008 12/22/21 2237   12/22/21 2030  vancomycin (VANCOCIN) 500 mg in sodium chloride 0.9 % 500 mL IVPB        500 mg 250 mL/hr over 120 Minutes Intravenous  Once 12/22/21 2024 12/23/21 0057   12/22/21 2030  vancomycin (VANCOCIN) 500 MG powder       Note to Pharmacy: Ivonne Andrew A: cabinet override      12/22/21 2030 12/23/21 0844   12/22/21 1915  vancomycin (VANCOCIN) 2,500 mg in sodium chloride 0.9 % 500 mL IVPB  Status:  Discontinued        2,500 mg 250 mL/hr over 120 Minutes Intravenous  Once 12/22/21 1910 12/22/21 1913   12/22/21 1915  vancomycin (VANCOCIN) IVPB 1000 mg/200 mL premix       See Hyperspace for full Linked Orders Report.   1,000 mg 200 mL/hr over 60 Minutes Intravenous  Once 12/22/21 1915 12/22/21 2131   12/22/21 1830  ceFEPIme (MAXIPIME) 2 g in sodium chloride 0.9 % 100 mL IVPB        2 g 200 mL/hr over 30 Minutes Intravenous  Once 12/22/21 1824 12/22/21 2004   12/22/21 1830  metroNIDAZOLE (FLAGYL) IVPB 500 mg        500 mg 100 mL/hr over 60 Minutes Intravenous  Once 12/22/21 1824 12/22/21 2045        Assessment/Plan POD#5 S/p I&D Left buttock abscess and candidiasis 1/6 Dr. Donell Beers - culture MRSA - WBC trending down, afebrile - No fluctuance on exam but significant persistent induration. Will ask MD to examine as well. - continue  antibiotics - primary discussed with ID who recommended switching to Zyvox - dry dressing PRN saturation -  asked RN to have patient work on doing this independently  - shower with wound open - Patient needs to mobilize more. Will ask PT to see.  - increase miralax BID and give dulcolax suppository    ID - currently maxipime/ vancomycin/ fluconazole stopped 1/10. Zyvox 1/10>>1/16 FEN - CM diet VTE - lovenox Foley - none   Perineal/vaginal candidiasis - now on topical antifungals Emphysematous cystitis - Ucx 40K MRSA and yeast, was on IV fluconazole (now stopped) and topical antifungals MS DM HTN OSA Morbid obesity BMI 45.94   LOS: 5 days    Franne Forts, [facility] Surgery 12/28/2021, 11:08 AM Please see Amion for pager number during day hours 7:00am-4:30pm

## 2021-12-28 NOTE — Plan of Care (Signed)

## 2021-12-29 ENCOUNTER — Inpatient Hospital Stay (HOSPITAL_COMMUNITY): Payer: Medicare Other

## 2021-12-29 DIAGNOSIS — N3080 Other cystitis without hematuria: Secondary | ICD-10-CM | POA: Diagnosis not present

## 2021-12-29 DIAGNOSIS — F3289 Other specified depressive episodes: Secondary | ICD-10-CM | POA: Diagnosis not present

## 2021-12-29 DIAGNOSIS — L0231 Cutaneous abscess of buttock: Secondary | ICD-10-CM | POA: Diagnosis not present

## 2021-12-29 DIAGNOSIS — I10 Essential (primary) hypertension: Secondary | ICD-10-CM | POA: Diagnosis not present

## 2021-12-29 LAB — CBC
HCT: 30.8 % — ABNORMAL LOW (ref 36.0–46.0)
Hemoglobin: 10 g/dL — ABNORMAL LOW (ref 12.0–15.0)
MCH: 27 pg (ref 26.0–34.0)
MCHC: 32.5 g/dL (ref 30.0–36.0)
MCV: 83.2 fL (ref 80.0–100.0)
Platelets: 499 10*3/uL — ABNORMAL HIGH (ref 150–400)
RBC: 3.7 MIL/uL — ABNORMAL LOW (ref 3.87–5.11)
RDW: 15 % (ref 11.5–15.5)
WBC: 13.2 10*3/uL — ABNORMAL HIGH (ref 4.0–10.5)
nRBC: 0 % (ref 0.0–0.2)

## 2021-12-29 LAB — GLUCOSE, CAPILLARY
Glucose-Capillary: 140 mg/dL — ABNORMAL HIGH (ref 70–99)
Glucose-Capillary: 157 mg/dL — ABNORMAL HIGH (ref 70–99)
Glucose-Capillary: 113 mg/dL — ABNORMAL HIGH (ref 70–99)
Glucose-Capillary: 81 mg/dL (ref 70–99)

## 2021-12-29 MED ORDER — ONDANSETRON HCL 4 MG/2ML IJ SOLN
4.0000 mg | Freq: Four times a day (QID) | INTRAMUSCULAR | Status: DC | PRN
  Administered 2021-12-29: 4 mg via INTRAVENOUS
  Filled 2021-12-29: qty 2

## 2021-12-29 MED ORDER — IOHEXOL 300 MG/ML  SOLN
83.0000 mL | Freq: Once | INTRAMUSCULAR | Status: AC | PRN
  Administered 2021-12-29: 83 mL via INTRAVENOUS

## 2021-12-29 MED ORDER — IOHEXOL 9 MG/ML PO SOLN
ORAL | Status: AC
  Filled 2021-12-29: qty 1000

## 2021-12-29 NOTE — Progress Notes (Signed)
Physical Therapy Treatment Patient Details Name: Katrina Montes MRN: [id number] DOB: 04-Aug-1971 Today's Date: 12/29/2021   History of Present Illness 51 y/o female presented to ED on 12/22/21 for evaluation of abscess to L buttocks. CT showed L buttock cellulitis and found to also ave emphysematous cystitis. S/p I&D L buttock abscess on 1/6. PMH: diabetes, HTN, MS, optic neuritis    PT Comments    Patient fully participating with PT despite nausea. Able to ascend/descend 12 steps (1 step x 12 steps) with single hand rail. Required incr time, however no imbalance. Nearly all goals met.    Recommendations for follow up therapy are one component of a multi-disciplinary discharge planning process, led by the attending physician.  Recommendations may be updated based on patient status, additional functional criteria and insurance authorization.  Follow Up Recommendations  Home health PT     Assistance Recommended at Discharge Set up Supervision/Assistance  Patient can return home with the following A little help with bathing/dressing/bathroom;Help with stairs or ramp for entrance   Equipment Recommendations  Rolling walker (2 wheels)    Recommendations for Other Services       Precautions / Restrictions Precautions Precautions: Fall     Mobility  Bed Mobility Overal bed mobility: Modified Independent                  Transfers Overall transfer level: Needs assistance Equipment used: Rolling walker (2 wheels) Transfers: Sit to/from Stand Sit to Stand: Modified independent (Device/Increase time)                Ambulation/Gait Ambulation/Gait assistance: Supervision Gait Distance (Feet): 12 Feet Assistive device: Rolling walker (2 wheels) Gait Pattern/deviations: Step-through pattern;Trunk flexed Gait velocity: decreased         Stairs Stairs: Yes Stairs assistance: Min guard Stair Management: One rail Left;Step to pattern;Forwards Number of  Stairs: 12 General stair comments: due to contact isolation, used single step in room with bed rail elevated to serve as a rail. Pt completed step up/down x 12 reps   Wheelchair Mobility    Modified Rankin (Stroke Patients Only)       Balance Overall balance assessment: Mild deficits observed, not formally tested                                          Cognition Arousal/Alertness: Awake/alert Behavior During Therapy: WFL for tasks assessed/performed Overall Cognitive Status: Within Functional Limits for tasks assessed                                          Exercises      General Comments        Pertinent Vitals/Pain Pain Assessment: 0-10 Pain Score: 6  Pain Location: L buttocks Pain Descriptors / Indicators: Grimacing;Discomfort Pain Intervention(s): Limited activity within patient's tolerance    Home Living                          Prior Function            PT Goals (current goals can now be found in the care plan section) Acute Rehab PT Goals Patient Stated Goal: to go home PT Goal Formulation: With patient Time For Goal Achievement: 01/11/22 Potential to Achieve Goals: Good  Progress towards PT goals: Progressing toward goals    Frequency    Min 3X/week      PT Plan Current plan remains appropriate    Co-evaluation              AM-PAC PT "6 Clicks" Mobility   Outcome Measure  Help needed turning from your back to your side while in a flat bed without using bedrails?: None Help needed moving from lying on your back to sitting on the side of a flat bed without using bedrails?: None Help needed moving to and from a bed to a chair (including a wheelchair)?: None Help needed standing up from a chair using your arms (e.g., wheelchair or bedside chair)?: None Help needed to walk in hospital room?: A Little Help needed climbing 3-5 steps with a railing? : A Little 6 Click Score: 22    End of  Session   Activity Tolerance: Patient tolerated treatment well Patient left: in bed;with call bell/phone within reach Nurse Communication: Mobility status PT Visit Diagnosis: Unsteadiness on feet (R26.81);Muscle weakness (generalized) (M62.81)     Time: 7001-7494 PT Time Calculation (min) (ACUTE ONLY): 13 min  Charges:  $Gait Training: 8-22 mins                      Arby Barrette, PT Acute Rehabilitation Services  Pager [phone] Office [phone]    Rexanne Mano 12/29/2021, 11:02 AM

## 2021-12-29 NOTE — Progress Notes (Signed)
Patient ID: Katrina Montes, female   DOB: Dec 12, 1971, 51 y.o.   MRN: [id number] [facility] Surgery Progress Note  6 Days Post-Op  Subjective: CC-  Persistent pain and induration at surgical site. WBC up 13.2, afebrile  Objective: Vital signs in last 24 hours: Temp:  [97.7 F (36.5 C)-98.3 F (36.8 C)] 97.8 F (36.6 C) (01/12 0821) Pulse Rate:  [88-100] 90 (01/12 0821) Resp:  [18-20] 18 (01/12 0821) BP: (126-140)/(75-97) 126/75 (01/12 0821) SpO2:  [96 %-100 %] 97 % (01/12 0821) Last BM Date: 12/28/21  Intake/Output from previous day: No intake/output data recorded. Intake/Output this shift: No intake/output data recorded.  PE: Gen:  Alert, NAD, pleasant Pulm:  rate and effort normal GU: buttock wound with penrose present and some purulent drainage, persistent induration fairly stable from yesterday, no fluctuance   Lab Results:  Recent Labs    12/28/21 1021 12/29/21 0129  WBC 10.9* 13.2*  HGB 9.7* 10.0*  HCT 30.6* 30.8*  PLT 481* 499*   BMET No results for input(s): NA, K, CL, CO2, GLUCOSE, BUN, CREATININE, CALCIUM in the last 72 hours. PT/INR No results for input(s): LABPROT, INR in the last 72 hours. CMP     Component Value Date/Time   NA 136 12/26/2021 0212   NA 137 07/19/2020 0813   K 4.1 12/26/2021 0212   CL 104 12/26/2021 0212   CO2 25 12/26/2021 0212   GLUCOSE 127 (H) 12/26/2021 0212   BUN 13 12/26/2021 0212   BUN 12 07/19/2020 0813   CREATININE 0.60 12/26/2021 0212   CREATININE 0.67 11/19/2020 0931   CALCIUM 8.5 (L) 12/26/2021 0212   PROT 8.4 (H) 12/22/2021 1823   PROT 7.2 07/19/2020 0813   ALBUMIN 3.7 12/22/2021 1823   ALBUMIN 3.8 07/19/2020 0813   AST 10 (L) 12/22/2021 1823   ALT 10 12/22/2021 1823   ALKPHOS 157 (H) 12/22/2021 1823   BILITOT 0.8 12/22/2021 1823   BILITOT 0.4 07/19/2020 0813   GFRNONAA >60 12/26/2021 0212   GFRAA 120 07/19/2020 0813   Lipase  No results found for: LIPASE     Studies/Results: No results  found.  Anti-infectives: Anti-infectives (From admission, onward)    Start     Dose/Rate Route Frequency Ordered Stop   12/27/21 1245  linezolid (ZYVOX) tablet 600 mg        600 mg Oral Every 12 hours 12/27/21 1154 01/02/22 2359   12/25/21 2200  vancomycin (VANCOREADY) IVPB 2000 mg/400 mL  Status:  Discontinued        2,000 mg 200 mL/hr over 120 Minutes Intravenous Every 24 hours 12/25/21 0829 12/27/21 1154   12/24/21 1130  fluconazole (DIFLUCAN) IVPB 100 mg  Status:  Discontinued        100 mg 50 mL/hr over 60 Minutes Intravenous Every 24 hours 12/24/21 1040 12/27/21 1155   12/24/21 0100  vancomycin (VANCOREADY) IVPB 1750 mg/350 mL  Status:  Discontinued        1,750 mg 175 mL/hr over 120 Minutes Intravenous Every 24 hours 12/23/21 2358 12/25/21 0829   12/23/21 2130  vancomycin (VANCOREADY) IVPB 1750 mg/350 mL  Status:  Discontinued        1,750 mg 175 mL/hr over 120 Minutes Intravenous Every 24 hours 12/22/21 2009 12/23/21 2358   12/23/21 1959  metroNIDAZOLE (FLAGYL) 500 MG/100ML IVPB       Note to Pharmacy: Shanda Bumps M: cabinet override      12/23/21 1959 12/23/21 2019   12/23/21 1845  metroNIDAZOLE (FLAGYL) IVPB  500 mg        500 mg 100 mL/hr over 60 Minutes Intravenous  Once 12/23/21 1844 12/23/21 2018   12/23/21 1800  fluconazole (DIFLUCAN) tablet 150 mg  Status:  Discontinued        150 mg Oral Daily 12/23/21 1522 12/23/21 2032   12/23/21 0400  ceFEPIme (MAXIPIME) 2 g in sodium chloride 0.9 % 100 mL IVPB  Status:  Discontinued        2 g 200 mL/hr over 30 Minutes Intravenous Every 8 hours 12/22/21 2223 12/27/21 1154   12/22/21 2130  vancomycin (VANCOREADY) IVPB 500 mg/100 mL  Status:  Discontinued       See Hyperspace for full Linked Orders Report.   500 mg 100 mL/hr over 60 Minutes Intravenous  Once 12/22/21 2008 12/22/21 2026   12/22/21 2030  vancomycin (VANCOCIN) IVPB 1000 mg/200 mL premix  Status:  Discontinued       See Hyperspace for full Linked Orders Report.    1,000 mg 200 mL/hr over 60 Minutes Intravenous  Once 12/22/21 1915 12/22/21 2008   12/22/21 2030  vancomycin (VANCOREADY) IVPB 500 mg/100 mL  Status:  Discontinued       See Hyperspace for full Linked Orders Report.   500 mg 100 mL/hr over 60 Minutes Intravenous  Once 12/22/21 1915 12/22/21 2008   12/22/21 2030  vancomycin (VANCOCIN) IVPB 1000 mg/200 mL premix       See Hyperspace for full Linked Orders Report.   1,000 mg 200 mL/hr over 60 Minutes Intravenous  Once 12/22/21 2008 12/22/21 2237   12/22/21 2030  vancomycin (VANCOCIN) 500 mg in sodium chloride 0.9 % 500 mL IVPB        500 mg 250 mL/hr over 120 Minutes Intravenous  Once 12/22/21 2024 12/23/21 0057   12/22/21 2030  vancomycin (VANCOCIN) 500 MG powder       Note to Pharmacy: Ivonne Andrew A: cabinet override      12/22/21 2030 12/23/21 0844   12/22/21 1915  vancomycin (VANCOCIN) 2,500 mg in sodium chloride 0.9 % 500 mL IVPB  Status:  Discontinued        2,500 mg 250 mL/hr over 120 Minutes Intravenous  Once 12/22/21 1910 12/22/21 1913   12/22/21 1915  vancomycin (VANCOCIN) IVPB 1000 mg/200 mL premix       See Hyperspace for full Linked Orders Report.   1,000 mg 200 mL/hr over 60 Minutes Intravenous  Once 12/22/21 1915 12/22/21 2131   12/22/21 1830  ceFEPIme (MAXIPIME) 2 g in sodium chloride 0.9 % 100 mL IVPB        2 g 200 mL/hr over 30 Minutes Intravenous  Once 12/22/21 1824 12/22/21 2004   12/22/21 1830  metroNIDAZOLE (FLAGYL) IVPB 500 mg        500 mg 100 mL/hr over 60 Minutes Intravenous  Once 12/22/21 1824 12/22/21 2045        Assessment/Plan POD#6 S/p I&D Left buttock abscess and candidiasis 1/6 Dr. Donell Beers - culture MRSA - continue antibiotics - primary discussed with ID who recommended switching to Zyvox - dry dressing PRN saturation, shower with wound open - With persistent induration and worsening leukocytosis will obtain CT scan to evaluate for any undrained fluid collection.   ID - maxipime/  vancomycin/ fluconazole stopped 1/10. Zyvox 1/10>>1/16 FEN - CM diet VTE - lovenox Foley - none   Perineal/vaginal candidiasis - now on topical antifungals Emphysematous cystitis - Ucx 40K MRSA and yeast, was on IV fluconazole (now stopped)  and topical antifungals MS DM HTN OSA Morbid obesity BMI 45.94   LOS: 6 days    Franne Forts, [facility] Surgery 12/29/2021, 9:30 AM Please see Amion for pager number during day hours 7:00am-4:30pm

## 2021-12-29 NOTE — Progress Notes (Signed)
PROGRESS NOTE        PATIENT DETAILS Name: Katrina Montes Age: 51 y.o. Sex: female Date of Birth: [birth year] Admit Date: 12/22/2021 Admitting Physician Carollee Herter, DO PCP:Pcp, No  Brief Narrative: Patient is a 51 y.o. female with history of poorly controlled poorly controlled DM-2, HTN, MS-presented with left gluteal abscess-and hyperglycemic hyperosmolar state-started on broad-spectrum antibiotics/IV insulin-evaluated by general surgery and underwent I&D.  Patient was subsequently admitted to the hospitalist service.  See below for further details.  Subjective: Continues to complain of worsening pain in the perirectal/perineal area.  Objective: Vitals: Blood pressure 126/75, pulse 90, temperature 97.8 F (36.6 C), temperature source Oral, resp. rate 18, height 5\' 6"  (1.676 m), weight 129.1 kg, last menstrual period 12/12/2021, SpO2 97 %.   Exam: Gen Exam:Alert awake-not in any distress HEENT:atraumatic, normocephalic Chest: B/L clear to auscultation anteriorly CVS:S1S2 regular Abdomen:soft non tender, non distended Extremities:no edema Neurology: Non focal Skin: no rash   Pertinent Labs/Radiology: Recent Labs  Lab 12/22/21 1823 12/23/21 1240 12/26/21 0212 12/27/21 0111 12/29/21 0129  WBC 27.2*   < > 13.2*   < > 13.2*  HGB 11.6*   < > 9.1*   < > 10.0*  PLT 444*   < > 464*   < > 499*  NA 131*   < > 136  --   --   K 3.4*   < > 4.1  --   --   CREATININE 1.06*   < > 0.60  --   --   AST 10*  --   --   --   --   ALT 10  --   --   --   --   ALKPHOS 157*  --   --   --   --   BILITOT 0.8  --   --   --   --    < > = values in this interval not displayed.     Assessment/Plan: Left gluteal abscess due to MRSA: S/p I&D on 1/6-wound cultures positive for MRSA-General surgery following-discussed with ID MD Dr. 02/26/22 on 1/10-has been transitioned to Zyvox.  Given worsening pain and persistent leukocytosis-General surgery planning repeat CT scan  today.  Emphysematous cystitis: Improved-afebrile-leukocytosis almost resolved-urine culture positive for MRSA/yeast-after discussion with ID-has been transitioned to Zyvox.  Previously on cefepime and  fluconazole.  Non ketotic Hyperosmolar hyperglycemic state: No longer on IV insulin-transitioned to SQ insulin  DM-2 uncontrolled (A1c 12.4 on 1/6): Per patient/sister at bedside-A1c has been easily more than 10 for the past several years.  Apparently takes oral hypoglycemic agents and Basaglar 30 units daily-however for the past several months has been noncompliant to insulin as she was in the donut hole.  CBGs have significantly improved-continue Levemir 22 units daily, Premeal NovoLog of 6 units and SSI.    Recent Labs    12/28/21 1540 12/28/21 2101 12/29/21 0820  GLUCAP 162* 81 140*    Perineal/vaginal candidiasis: Continue topical antifungals.  Prolonged QTC: Likely due to hypokalemia-resolved on last EKG.  Watch closely   Hypokalemia: Repleted.  Hypomagnesemia: Repleted.  HTN: BP stable-continue lisinopril-hold HCTZ for now.  History of multiple sclerosis: Stable-on ocrelizumab injection every 6 months.  Mood disorder: Stable-continue Cymbalta  History of migraine headaches: Continue Topamax.  OSA: CPAP nightly  Morbid Obesity Estimated body mass index is 45.94 kg/m as calculated  from the following:   Height as of this encounter: 5\' 6"  (1.676 m).   Weight as of this encounter: 129.1 kg.   Procedures: 1/6>> I&D of left gluteal abscess. Consults: General surgery DVT Prophylaxis: Lovenox Code Status:Full code  Family Communication:None at bedside.   Disposition Plan: Status is: Inpatient  Remains inpatient appropriate because: Gluteal abscess-s/p I&D on 1/6-emphysematous cystitis-significant leukocytosis persists-not yet stable for discharge needs continued IV antimicrobial therapy.        Diet: Diet Order             Diet Carb Modified Fluid  consistency: Thin; Room service appropriate? Yes  Diet effective now                     Antimicrobial agents: Anti-infectives (From admission, onward)    Start     Dose/Rate Route Frequency Ordered Stop   12/27/21 1245  linezolid (ZYVOX) tablet 600 mg        600 mg Oral Every 12 hours 12/27/21 1154 01/02/22 2359   12/25/21 2200  vancomycin (VANCOREADY) IVPB 2000 mg/400 mL  Status:  Discontinued        2,000 mg 200 mL/hr over 120 Minutes Intravenous Every 24 hours 12/25/21 0829 12/27/21 1154   12/24/21 1130  fluconazole (DIFLUCAN) IVPB 100 mg  Status:  Discontinued        100 mg 50 mL/hr over 60 Minutes Intravenous Every 24 hours 12/24/21 1040 12/27/21 1155   12/24/21 0100  vancomycin (VANCOREADY) IVPB 1750 mg/350 mL  Status:  Discontinued        1,750 mg 175 mL/hr over 120 Minutes Intravenous Every 24 hours 12/23/21 2358 12/25/21 0829   12/23/21 2130  vancomycin (VANCOREADY) IVPB 1750 mg/350 mL  Status:  Discontinued        1,750 mg 175 mL/hr over 120 Minutes Intravenous Every 24 hours 12/22/21 2009 12/23/21 2358   12/23/21 1959  metroNIDAZOLE (FLAGYL) 500 MG/100ML IVPB       Note to Pharmacy: 02/20/22 M: cabinet override      12/23/21 1959 12/23/21 2019   12/23/21 1845  metroNIDAZOLE (FLAGYL) IVPB 500 mg        500 mg 100 mL/hr over 60 Minutes Intravenous  Once 12/23/21 1844 12/23/21 2018   12/23/21 1800  fluconazole (DIFLUCAN) tablet 150 mg  Status:  Discontinued        150 mg Oral Daily 12/23/21 1522 12/23/21 2032   12/23/21 0400  ceFEPIme (MAXIPIME) 2 g in sodium chloride 0.9 % 100 mL IVPB  Status:  Discontinued        2 g 200 mL/hr over 30 Minutes Intravenous Every 8 hours 12/22/21 2223 12/27/21 1154   12/22/21 2130  vancomycin (VANCOREADY) IVPB 500 mg/100 mL  Status:  Discontinued       See Hyperspace for full Linked Orders Report.   500 mg 100 mL/hr over 60 Minutes Intravenous  Once 12/22/21 2008 12/22/21 2026   12/22/21 2030  vancomycin (VANCOCIN) IVPB 1000  mg/200 mL premix  Status:  Discontinued       See Hyperspace for full Linked Orders Report.   1,000 mg 200 mL/hr over 60 Minutes Intravenous  Once 12/22/21 1915 12/22/21 2008   12/22/21 2030  vancomycin (VANCOREADY) IVPB 500 mg/100 mL  Status:  Discontinued       See Hyperspace for full Linked Orders Report.   500 mg 100 mL/hr over 60 Minutes Intravenous  Once 12/22/21 1915 12/22/21 2008   12/22/21 2030  vancomycin (VANCOCIN) IVPB 1000 mg/200 mL premix       See Hyperspace for full Linked Orders Report.   1,000 mg 200 mL/hr over 60 Minutes Intravenous  Once 12/22/21 2008 12/22/21 2237   12/22/21 2030  vancomycin (VANCOCIN) 500 mg in sodium chloride 0.9 % 500 mL IVPB        500 mg 250 mL/hr over 120 Minutes Intravenous  Once 12/22/21 2024 12/23/21 0057   12/22/21 2030  vancomycin (VANCOCIN) 500 MG powder       Note to Pharmacy: Ivonne Andrew A: cabinet override      12/22/21 2030 12/23/21 0844   12/22/21 1915  vancomycin (VANCOCIN) 2,500 mg in sodium chloride 0.9 % 500 mL IVPB  Status:  Discontinued        2,500 mg 250 mL/hr over 120 Minutes Intravenous  Once 12/22/21 1910 12/22/21 1913   12/22/21 1915  vancomycin (VANCOCIN) IVPB 1000 mg/200 mL premix       See Hyperspace for full Linked Orders Report.   1,000 mg 200 mL/hr over 60 Minutes Intravenous  Once 12/22/21 1915 12/22/21 2131   12/22/21 1830  ceFEPIme (MAXIPIME) 2 g in sodium chloride 0.9 % 100 mL IVPB        2 g 200 mL/hr over 30 Minutes Intravenous  Once 12/22/21 1824 12/22/21 2004   12/22/21 1830  metroNIDAZOLE (FLAGYL) IVPB 500 mg        500 mg 100 mL/hr over 60 Minutes Intravenous  Once 12/22/21 1824 12/22/21 2045        MEDICATIONS: Scheduled Meds:  bisacodyl  10 mg Rectal Once   Chlorhexidine Gluconate Cloth  6 each Topical Daily   clotrimazole   Topical BID   docusate sodium  50 mg Oral BID   DULoxetine  60 mg Oral Daily   enoxaparin (LOVENOX) injection  60 mg Subcutaneous Q24H   ferrous sulfate  325 mg  Oral Q breakfast   insulin aspart  0-15 Units Subcutaneous TID WC   insulin aspart  6 Units Subcutaneous TID WC   insulin detemir  22 Units Subcutaneous Daily   iohexol       linezolid  600 mg Oral Q12H   lisinopril  20 mg Oral Daily   multivitamin with minerals  1 tablet Oral Daily   polyethylene glycol  17 g Oral BID   topiramate  100 mg Oral QHS   Continuous Infusions:   PRN Meds:.acetaminophen **OR** acetaminophen, cyclobenzaprine, dextrose, HYDROmorphone (DILAUDID) injection, lip balm, oxyCODONE   I have personally reviewed following labs and imaging studies  LABORATORY DATA: CBC: Recent Labs  Lab 12/22/21 1823 12/23/21 1825 12/25/21 0057 12/26/21 0212 12/27/21 0111 12/28/21 1021 12/29/21 0129  WBC 27.2*   < > 16.6* 13.2* 12.9* 10.9* 13.2*  NEUTROABS 23.7*  --   --   --   --   --   --   HGB 11.6*   < > 9.1* 9.1* 9.5* 9.7* 10.0*  HCT 35.7*   < > 28.0* 29.7* 30.4* 30.6* 30.8*  MCV 81.5   < > 82.6 83.2 83.3 82.3 83.2  PLT 444*   < > 384 464* 453* 481* 499*   < > = values in this interval not displayed.     Basic Metabolic Panel: Recent Labs  Lab 12/23/21 1240 12/23/21 1739 12/23/21 1825 12/23/21 2240 12/24/21 0126 12/25/21 0057 12/26/21 0212  NA 136 135 135  --  134* 137 136  K 2.9* 3.0* 4.0  --  3.3* 3.3* 4.1  CL 100 101 103  --  101 101 104  CO2 29 26  --   --  GLUCOSE 173* 172* 173*  --  244* 144* 127*  BUN --  CREATININE 0.56 0.52 0.30*  --  0.59 0.58 0.60  CALCIUM 8.5* 8.3*  --   --  8.4* 8.4* 8.5*  MG  --   --   --  1.6*  --  1.7 2.1     GFR: Estimated Creatinine Clearance: 117.1 mL/min (by C-G formula based on SCr of 0.6 mg/dL).  Liver Function Tests: Recent Labs  Lab 12/22/21 1823  AST 10*  ALT 10  ALKPHOS 157*  BILITOT 0.8  PROT 8.4*  ALBUMIN 3.7    No results for input(s): LIPASE, AMYLASE in the last 168 hours. No results for input(s): AMMONIA in the last 168 hours.  Coagulation Profile: Recent  Labs  Lab 12/22/21 1823  INR 1.1     Cardiac Enzymes: No results for input(s): CKTOTAL, CKMB, CKMBINDEX, TROPONINI in the last 168 hours.  BNP (last 3 results) No results for input(s): PROBNP in the last 8760 hours.  Lipid Profile: No results for input(s): CHOL, HDL, LDLCALC, TRIG, CHOLHDL, LDLDIRECT in the last 72 hours.  Thyroid Function Tests: No results for input(s): TSH, T4TOTAL, FREET4, T3FREE, THYROIDAB in the last 72 hours.  Anemia Panel: No results for input(s): VITAMINB12, FOLATE, FERRITIN, TIBC, IRON, RETICCTPCT in the last 72 hours.  Urine analysis:    Component Value Date/Time   COLORURINE AMBER (A) 12/23/2021 1833   APPEARANCEUR HAZY (A) 12/23/2021 1833   LABSPEC 1.015 12/23/2021 1833   PHURINE 5.5 12/23/2021 1833   GLUCOSEU >=500 (A) 12/23/2021 1833   HGBUR LARGE (A) 12/23/2021 1833   HGBUR negative 10/24/2010 0821   BILIRUBINUR SMALL (A) 12/23/2021 1833   KETONESUR NEGATIVE 12/23/2021 1833   PROTEINUR 30 (A) 12/23/2021 1833   UROBILINOGEN 0.2 10/24/2010 0821   NITRITE NEGATIVE 12/23/2021 1833   LEUKOCYTESUR SMALL (A) 12/23/2021 1833    Sepsis Labs: Lactic Acid, Venous    Component Value Date/Time   LATICACIDVEN 1.9 12/22/2021 1823    MICROBIOLOGY: Recent Results (from the past 240 hour(s))  Blood Culture (routine x 2)     Status: None   Collection Time: 12/22/21  6:05 PM   Specimen: BLOOD  Result Value Ref Range Status   Specimen Description   Final    BLOOD LEFT ANTECUBITAL Performed at [facility], [address] [place], [place] [address]    Special Requests   Final    Blood Culture adequate volume BOTTLES DRAWN AEROBIC AND ANAEROBIC Performed at [facility], [address] [place], [place] [address]    Culture   Final    NO GROWTH 5 DAYS Performed at [facility], [address] [place], [place] [address]    Report Status 12/27/2021 FINAL  Final  Blood Culture (routine x 2)      Status: None   Collection Time: 12/22/21  7:00 PM   Specimen: BLOOD  Result Value Ref Range Status   Specimen Description   Final    BLOOD BLOOD RIGHT FOREARM Performed at [facility], [address], [place], [place] [address]    Special Requests   Final    Blood Culture adequate volume BOTTLES DRAWN AEROBIC AND ANAEROBIC Performed at [facility], [address] [place], [place] [address]    Culture  Final    NO GROWTH 5 DAYS Performed at [facility], [address] [place], [place] [address]    Report Status 12/27/2021 FINAL  Final  Resp Panel by RT-PCR (Flu A&B, Covid) Nasopharyngeal Swab     Status: None   Collection Time: 12/22/21  7:04 PM   Specimen: Nasopharyngeal Swab; Nasopharyngeal(NP) swabs in vial transport medium  Result Value Ref Range Status   SARS Coronavirus 2 by RT PCR NEGATIVE NEGATIVE Final    Comment: (NOTE) SARS-CoV-2 target nucleic acids are NOT DETECTED.  The SARS-CoV-2 RNA is generally detectable in upper respiratory specimens during the acute phase of infection. The lowest concentration of SARS-CoV-2 viral copies this assay can detect is 138 copies/mL. A negative result does not preclude SARS-Cov-2 infection and should not be used as the sole basis for treatment or other patient management decisions. A negative result may occur with  improper specimen collection/handling, submission of specimen other than nasopharyngeal swab, presence of viral mutation(s) within the areas targeted by this assay, and inadequate number of viral copies(<138 copies/mL). A negative result must be combined with clinical observations, patient history, and epidemiological information. The expected result is Negative.  Fact Sheet for Patients:  BloggerCourse.com  Fact Sheet for Healthcare Providers:  SeriousBroker.it  This test is no t yet approved or cleared by the [place] FDA and  has been authorized for detection and/or diagnosis of SARS-CoV-2 by FDA under an Emergency Use Authorization (EUA). This EUA will remain  in effect (meaning this test can be used) for the duration of the COVID-19 declaration under Section 564(b)(1) of the Act, 21 U.S.C.section 360bbb-3(b)(1), unless the authorization is terminated  or revoked sooner.       Influenza A by PCR NEGATIVE NEGATIVE Final   Influenza B by PCR NEGATIVE NEGATIVE Final    Comment: (NOTE) The Xpert Xpress SARS-CoV-2/FLU/RSV plus assay is intended as an aid in the diagnosis of influenza from Nasopharyngeal swab specimens and should not be used as a sole basis for treatment. Nasal washings and aspirates are unacceptable for Xpert Xpress SARS-CoV-2/FLU/RSV testing.  Fact Sheet for Patients: BloggerCourse.com  Fact Sheet for Healthcare Providers: SeriousBroker.it  This test is not yet approved or cleared by the [place] FDA and has been authorized for detection and/or diagnosis of SARS-CoV-2 by FDA under an Emergency Use Authorization (EUA). This EUA will remain in effect (meaning this test can be used) for the duration of the COVID-19 declaration under Section 564(b)(1) of the Act, 21 U.S.C. section 360bbb-3(b)(1), unless the authorization is terminated or revoked.  Performed at [facility], [address], [place], [place] [address]   Urine Culture     Status: Abnormal   Collection Time: 12/23/21  3:43 PM   Specimen: Urine, Clean Catch  Result Value Ref Range Status   Specimen Description URINE, CLEAN CATCH  Final   Special Requests   Final    NONE Performed at [facility], [address] [place], [place] [address]    Culture (A)  Final    40,000 COLONIES/mL METHICILLIN RESISTANT STAPHYLOCOCCUS AUREUS 20,000 COLONIES/mL YEAST    Report Status 12/26/2021 FINAL  Final   Organism ID, Bacteria  METHICILLIN RESISTANT STAPHYLOCOCCUS AUREUS (A)  Final      Susceptibility   Methicillin resistant staphylococcus aureus - MIC*    CIPROFLOXACIN >=8 RESISTANT Resistant     GENTAMICIN <=0.5 SENSITIVE Sensitive     NITROFURANTOIN <=16 SENSITIVE Sensitive     OXACILLIN >=  4 RESISTANT Resistant     TETRACYCLINE >=16 RESISTANT Resistant     VANCOMYCIN <=0.5 SENSITIVE Sensitive     TRIMETH/SULFA >=320 RESISTANT Resistant     CLINDAMYCIN <=0.25 SENSITIVE Sensitive     RIFAMPIN <=0.5 SENSITIVE Sensitive     Inducible Clindamycin NEGATIVE Sensitive     * 40,000 COLONIES/mL METHICILLIN RESISTANT STAPHYLOCOCCUS AUREUS  Surgical PCR screen     Status: Abnormal   Collection Time: 12/23/21  5:17 PM   Specimen: Nasal Mucosa; Nasal Swab  Result Value Ref Range Status   MRSA, PCR POSITIVE (A) NEGATIVE Final    Comment: RESULT SENT TO PAMELA HAWKS  12/23/21   Staphylococcus aureus POSITIVE (A) NEGATIVE Final    Comment: (NOTE) The Xpert SA Assay (FDA approved for NASAL specimens in patients 28 years of age and older), is one component of a comprehensive surveillance program. It is not intended to diagnose infection nor to guide or monitor treatment. Performed at [facility], [address] [place], [place] [address]   Aerobic/Anaerobic Culture w Gram Stain (surgical/deep wound)     Status: None   Collection Time: 12/23/21  8:28 PM   Specimen: Abscess  Result Value Ref Range Status   Specimen Description ABSCESS  Final   Special Requests LEFT BUTTOCK  Final   Gram Stain   Final    MODERATE WBC PRESENT,BOTH PMN AND MONONUCLEAR MODERATE GRAM POSITIVE COCCI    Culture   Final    ABUNDANT METHICILLIN RESISTANT STAPHYLOCOCCUS AUREUS RARE PREVOTELLA BIVIA BETA LACTAMASE POSITIVE Performed at [facility], [address] [place], [place] [address]    Report Status 12/27/2021 FINAL  Final   Organism ID, Bacteria METHICILLIN RESISTANT STAPHYLOCOCCUS AUREUS  Final       Susceptibility   Methicillin resistant staphylococcus aureus - MIC*    CIPROFLOXACIN 4 RESISTANT Resistant     ERYTHROMYCIN >=8 RESISTANT Resistant     GENTAMICIN <=0.5 SENSITIVE Sensitive     OXACILLIN >=4 RESISTANT Resistant     TETRACYCLINE >=16 RESISTANT Resistant     VANCOMYCIN <=0.5 SENSITIVE Sensitive     TRIMETH/SULFA 160 RESISTANT Resistant     CLINDAMYCIN <=0.25 SENSITIVE Sensitive     RIFAMPIN <=0.5 SENSITIVE Sensitive     Inducible Clindamycin NEGATIVE Sensitive     * ABUNDANT METHICILLIN RESISTANT STAPHYLOCOCCUS AUREUS    RADIOLOGY STUDIES/RESULTS: No results found.   LOS: 6 days   Jeoffrey Massed, MD  [facility]    To contact the attending provider between 7A-7P or the covering provider during after hours 7P-7A, please log into the web site [email or web address] and access using universal [place] password for that web site. If you do not have the password, please call the hospital operator.  12/29/2021, 12:41 PM

## 2021-12-29 NOTE — TOC Progression Note (Signed)
Transition of Care [facility]) - Progression Note    Patient Details  Name: Katrina Montes MRN: [id number] Date of Birth: 1971-06-25  Transition of Care [facility]) CM/SW Contact  Cyndi Bender, RN Phone Number: 12/29/2021, 11:00 AM  Clinical Narrative:    Orders for [place] and DME. Spoke to patient regarding transition needs. Choice offered and patient defers to [facility] to find [facility] agency.  Patient is agreeable to use in house provider, [employer], for DME needs.  Spoke to [place] with [place] and HH-PT referral accepted. Spoke to [place] with [employer], Ordered walker to be delivered to room prior to discharge.        Expected Discharge Plan: [place] Barriers to Discharge: Continued Medical Work up  Expected Discharge Plan and Services Expected Discharge Plan: [place]   Discharge Planning Services: CM Consult Post Acute Care Choice: Durable Medical Equipment, Home Health Living arrangements for the past 2 months: Single Family Home                 DME Arranged: Gilford Rile DME Agency: [facility] Date DME Agency Contacted: 12/29/21 Time DME Agency Contacted: [phone] Representative spoke with at DME Agency: Freda Munro HH Arranged: PT [place]: [employer] Date [place]: 12/29/21 Time [place]: 1028 Representative spoke with at [place]: [place] ([place]) Interventions    Readmission Risk Interventions No flowsheet data found.

## 2021-12-30 ENCOUNTER — Other Ambulatory Visit (HOSPITAL_COMMUNITY): Payer: Self-pay

## 2021-12-30 DIAGNOSIS — G35 Multiple sclerosis: Secondary | ICD-10-CM

## 2021-12-30 DIAGNOSIS — L0231 Cutaneous abscess of buttock: Secondary | ICD-10-CM | POA: Diagnosis not present

## 2021-12-30 DIAGNOSIS — I10 Essential (primary) hypertension: Secondary | ICD-10-CM | POA: Diagnosis not present

## 2021-12-30 DIAGNOSIS — N3080 Other cystitis without hematuria: Secondary | ICD-10-CM | POA: Diagnosis not present

## 2021-12-30 LAB — CBC
HCT: 31.7 % — ABNORMAL LOW (ref 36.0–46.0)
Hemoglobin: 10.2 g/dL — ABNORMAL LOW (ref 12.0–15.0)
MCH: 26.6 pg (ref 26.0–34.0)
MCHC: 32.2 g/dL (ref 30.0–36.0)
MCV: 82.6 fL (ref 80.0–100.0)
Platelets: 502 10*3/uL — ABNORMAL HIGH (ref 150–400)
RBC: 3.84 MIL/uL — ABNORMAL LOW (ref 3.87–5.11)
RDW: 15.2 % (ref 11.5–15.5)
WBC: 10.6 10*3/uL — ABNORMAL HIGH (ref 4.0–10.5)
nRBC: 0 % (ref 0.0–0.2)

## 2021-12-30 LAB — GLUCOSE, CAPILLARY
Glucose-Capillary: 136 mg/dL — ABNORMAL HIGH (ref 70–99)
Glucose-Capillary: 108 mg/dL — ABNORMAL HIGH (ref 70–99)

## 2021-12-30 MED ORDER — DOCUSATE SODIUM 100 MG PO CAPS: 100.0000 mg | ORAL_CAPSULE | Freq: Every day | ORAL | Status: DC

## 2021-12-30 MED ORDER — LISINOPRIL 20 MG PO TABS
20.0000 mg | ORAL_TABLET | Freq: Every day | ORAL | 11 refills | Status: DC
  Filled 2021-12-30: qty 30, 30d supply, fill #0

## 2021-12-30 MED ORDER — INSULIN PEN NEEDLE 32G X 4 MM MISC
0 refills | Status: DC
  Filled 2021-12-30: qty 100, 30d supply, fill #0

## 2021-12-30 MED ORDER — CLOTRIMAZOLE 1 % EX CREA
TOPICAL_CREAM | Freq: Two times a day (BID) | CUTANEOUS | 0 refills | Status: DC
  Filled 2021-12-30: qty 28, 7d supply, fill #0

## 2021-12-30 MED ORDER — ACCU-CHEK SOFTCLIX LANCETS MISC
5 refills | Status: DC
  Filled 2021-12-30: qty 100, 30d supply, fill #0

## 2021-12-30 MED ORDER — LINEZOLID 600 MG PO TABS
600.0000 mg | ORAL_TABLET | Freq: Two times a day (BID) | ORAL | 0 refills | Status: AC
  Filled 2021-12-30: qty 12, 6d supply, fill #0

## 2021-12-30 MED ORDER — BLOOD GLUCOSE METER KIT: PACK | 0 refills | Status: DC

## 2021-12-30 MED ORDER — METFORMIN HCL 500 MG PO TABS
500.0000 mg | ORAL_TABLET | Freq: Two times a day (BID) | ORAL | 11 refills | Status: DC
  Filled 2021-12-30: qty 60, 30d supply, fill #0

## 2021-12-30 MED ORDER — ACCU-CHEK GUIDE VI STRP
ORAL_STRIP | 12 refills | Status: DC
Start: 2021-12-30 — End: 2022-04-06
  Filled 2021-12-30: qty 200, 30d supply, fill #0

## 2021-12-30 MED ORDER — INSULIN PEN NEEDLE 32G X 8 MM MISC: 0 refills | Status: DC

## 2021-12-30 MED ORDER — POLYETHYLENE GLYCOL 3350 17 GM/SCOOP PO POWD
17.0000 g | Freq: Every day | ORAL | 0 refills | Status: DC
  Filled 2021-12-30: qty 238, 14d supply, fill #0

## 2021-12-30 MED ORDER — ACCU-CHEK GUIDE ME W/DEVICE KIT
PACK | 0 refills | Status: DC
  Filled 2021-12-30: qty 1, 30d supply, fill #0

## 2021-12-30 MED ORDER — INSULIN DEGLUDEC 100 UNIT/ML ~~LOC~~ SOPN
22.0000 [IU] | PEN_INJECTOR | Freq: Every day | SUBCUTANEOUS | 11 refills | Status: DC
  Filled 2021-12-30: qty 9, 30d supply, fill #0

## 2021-12-30 MED ORDER — OXYCODONE HCL 5 MG PO TABS
5.0000 mg | ORAL_TABLET | Freq: Four times a day (QID) | ORAL | 0 refills | Status: DC | PRN
  Filled 2021-12-30: qty 15, 4d supply, fill #0

## 2021-12-30 MED ORDER — INSULIN LISPRO (1 UNIT DIAL) 100 UNIT/ML (KWIKPEN)
6.0000 [IU] | PEN_INJECTOR | Freq: Three times a day (TID) | SUBCUTANEOUS | 11 refills | Status: DC
  Filled 2021-12-30: qty 6, 30d supply, fill #0

## 2021-12-30 NOTE — Progress Notes (Signed)
Pt stated her sister will transport her home for discharge this afternoon. Pt stated she still feels "off" from taking the oxycodone this am.

## 2021-12-30 NOTE — Care Management Important Message (Signed)
Important Message  Patient Details  Name: Katrina Montes MRN: [id number] Date of Birth: 1971/02/17   Medicare Important Message Given:  Yes     Renie Ora 12/30/2021, 2:06 PM

## 2021-12-30 NOTE — TOC Transition Note (Signed)
Transition of Care [facility]) - CM/SW Discharge Note   Patient Details  Name: Courtnee Myer MRN: [id number] Date of Birth: January 10, 1971  Transition of Care [facility]) CM/SW Contact:  Lockie Pares, RN Phone Number: 12/30/2021, 12:25 PM   Clinical Narrative:     Orders seen for PT OT RN for home health [facility] from [place] aware. RW delivered yesterday.Marland Kitchen Spoke to [employer], she feels she has everything needed to go home, medication delivered to room. Cory from [place] notified of DC    Barriers to Discharge: ED No Barriers, No Barriers Identified   Patient Goals and CMS Choice Patient states their goals for this hospitalization and ongoing recovery are:: return home CMS Medicare.gov Compare Post Acute Care list provided to:: Patient Choice offered to / list presented to : Patient  Discharge Placement          Homew/   Home Health          Discharge Plan and Services   Discharge Planning Services: CM Consult Post Acute Care Choice: Durable Medical Equipment, Home Health          DME Arranged: Dan Humphreys DME Agency: [facility] Date DME Agency Contacted: 12/29/21 Time DME Agency Contacted: 1028 Representative spoke with at DME Agency: Velna Hatchet HH Arranged: RN, OT [facility] Agency: [facility] Date [facility] Agency Contacted: 12/30/21 Time HH Agency Contacted: 1225 Representative spoke with at [facility] Agency: Kandee Keen  Social Determinants of Health (SDOH) Interventions     Readmission Risk Interventions No flowsheet data found.

## 2021-12-30 NOTE — Progress Notes (Signed)
Patient ID: Katrina Montes, female   DOB: [birth year], 50 y.o.   MRN: [id number] [facility] Surgery Progress Note  7 Days Post-Op  Subjective: CC-  No new complaints.  Still with some pain as expected to her buttock.  Objective: Vital signs in last 24 hours: Temp:  [96 F (35.6 C)-97.9 F (36.6 C)] 97.6 F (36.4 C) (01/13 0735) Pulse Rate:  [84-94] 90 (01/13 0735) Resp:  [17-19] 18 (01/13 0735) BP: (123-128)/(72-85) 123/72 (01/13 0735) SpO2:  [96 %-100 %] 97 % (01/13 0735) Last BM Date: 12/29/21  Intake/Output from previous day: No intake/output data recorded. Intake/Output this shift: No intake/output data recorded.  PE: Gen:  Alert, NAD, pleasant Pulm:  rate and effort normal GU: buttock wound with penrose present and some purulent drainage, persistent induration but much better than preop.  Lab Results:  Recent Labs    12/29/21 0129 12/30/21 0102  WBC 13.2* 10.6*  HGB 10.0* 10.2*  HCT 30.8* 31.7*  PLT 499* 502*   BMET No results for input(s): NA, K, CL, CO2, GLUCOSE, BUN, CREATININE, CALCIUM in the last 72 hours. PT/INR No results for input(s): LABPROT, INR in the last 72 hours. CMP     Component Value Date/Time   NA 136 12/26/2021 0212   NA 137 07/19/2020 0813   K 4.1 12/26/2021 0212   CL 104 12/26/2021 0212   CO2 25 12/26/2021 0212   GLUCOSE 127 (H) 12/26/2021 0212   BUN 13 12/26/2021 0212   BUN 12 07/19/2020 0813   CREATININE 0.60 12/26/2021 0212   CREATININE 0.67 11/19/2020 0931   CALCIUM 8.5 (L) 12/26/2021 0212   PROT 8.4 (H) 12/22/2021 1823   PROT 7.2 07/19/2020 0813   ALBUMIN 3.7 12/22/2021 1823   ALBUMIN 3.8 07/19/2020 0813   AST 10 (L) 12/22/2021 1823   ALT 10 12/22/2021 1823   ALKPHOS 157 (H) 12/22/2021 1823   BILITOT 0.8 12/22/2021 1823   BILITOT 0.4 07/19/2020 0813   GFRNONAA >60 12/26/2021 0212   GFRAA 120 07/19/2020 0813   Lipase  No results found for: LIPASE     Studies/Results: CT ABDOMEN PELVIS W  CONTRAST  Result Date: 12/29/2021 CLINICAL DATA:  Worsening perirectal and perineal pain. Recent left buttock abscess incision and drainage. EXAM: CT ABDOMEN AND PELVIS WITH CONTRAST TECHNIQUE: Multidetector CT imaging of the abdomen and pelvis was performed using the standard protocol following bolus administration of intravenous contrast. CONTRAST:  46mL OMNIPAQUE IOHEXOL 300 MG/ML  SOLN COMPARISON:  CT abdomen and pelvis dated December 22, 2021. FINDINGS: Lower chest: No acute abnormality. Hepatobiliary: No new focal liver abnormality. Hemangioma in the posterior right hepatic lobe is not well visualized on today's study. Unchanged gallbladder sludge. No gallbladder wall thickening or biliary dilatation. Pancreas: Unremarkable. No pancreatic ductal dilatation or surrounding inflammatory changes. Spleen: Normal in size without focal abnormality. Adrenals/Urinary Tract: Adrenal glands are unremarkable. Kidneys are normal, without renal calculi, focal lesion, or hydronephrosis. Bladder is unremarkable. Stomach/Bowel: Stomach is within normal limits. Appendix appears normal. No evidence of bowel wall thickening, distention, or inflammatory changes. Vascular/Lymphatic: No significant vascular findings are present. No enlarged abdominal or pelvic lymph nodes. Reproductive: Uterus and bilateral adnexa are unremarkable. Other: No abdominal wall hernia or abnormality. No abdominopelvic ascites. No pneumoperitoneum. Musculoskeletal: New surgical drain in the medial left buttock along the gluteal cleft with decreased ill-defined fluid and mildly improved inflammatory changes. No discrete fluid collection. No subcutaneous emphysema. No acute or significant osseous findings. IMPRESSION: 1. New surgical drain in the  medial left buttock along the gluteal cleft with decreased ill-defined fluid and mildly improved inflammatory changes compared to prior study. No discrete fluid collection. Electronically Signed   By: Obie Dredge M.D.   On: 12/29/2021 15:56   CT US GUIDE VASC ACCESS RT NO REPORT  Result Date: 12/29/2021 There is no Radiologist interpretation  for this exam.   Anti-infectives: Anti-infectives (From admission, onward)    Start     Dose/Rate Route Frequency Ordered Stop   12/30/21 0000  linezolid (ZYVOX) 600 MG tablet        600 mg Oral Every 12 hours 12/30/21 0950 01/05/22 2359   12/27/21 1245  linezolid (ZYVOX) tablet 600 mg        600 mg Oral Every 12 hours 12/27/21 1154 01/02/22 2359   12/25/21 2200  vancomycin (VANCOREADY) IVPB 2000 mg/400 mL  Status:  Discontinued        2,000 mg 200 mL/hr over 120 Minutes Intravenous Every 24 hours 12/25/21 0829 12/27/21 1154   12/24/21 1130  fluconazole (DIFLUCAN) IVPB 100 mg  Status:  Discontinued        100 mg 50 mL/hr over 60 Minutes Intravenous Every 24 hours 12/24/21 1040 12/27/21 1155   12/24/21 0100  vancomycin (VANCOREADY) IVPB 1750 mg/350 mL  Status:  Discontinued        1,750 mg 175 mL/hr over 120 Minutes Intravenous Every 24 hours 12/23/21 2358 12/25/21 0829   12/23/21 2130  vancomycin (VANCOREADY) IVPB 1750 mg/350 mL  Status:  Discontinued        1,750 mg 175 mL/hr over 120 Minutes Intravenous Every 24 hours 12/22/21 2009 12/23/21 2358   12/23/21 1959  metroNIDAZOLE (FLAGYL) 500 MG/100ML IVPB       Note to Pharmacy: Shanda Bumps M: cabinet override      12/23/21 1959 12/23/21 2019   12/23/21 1845  metroNIDAZOLE (FLAGYL) IVPB 500 mg        500 mg 100 mL/hr over 60 Minutes Intravenous  Once 12/23/21 1844 12/23/21 2018   12/23/21 1800  fluconazole (DIFLUCAN) tablet 150 mg  Status:  Discontinued        150 mg Oral Daily 12/23/21 1522 12/23/21 2032   12/23/21 0400  ceFEPIme (MAXIPIME) 2 g in sodium chloride 0.9 % 100 mL IVPB  Status:  Discontinued        2 g 200 mL/hr over 30 Minutes Intravenous Every 8 hours 12/22/21 2223 12/27/21 1154   12/22/21 2130  vancomycin (VANCOREADY) IVPB 500 mg/100 mL  Status:  Discontinued       See  Hyperspace for full Linked Orders Report.   500 mg 100 mL/hr over 60 Minutes Intravenous  Once 12/22/21 2008 12/22/21 2026   12/22/21 2030  vancomycin (VANCOCIN) IVPB 1000 mg/200 mL premix  Status:  Discontinued       See Hyperspace for full Linked Orders Report.   1,000 mg 200 mL/hr over 60 Minutes Intravenous  Once 12/22/21 1915 12/22/21 2008   12/22/21 2030  vancomycin (VANCOREADY) IVPB 500 mg/100 mL  Status:  Discontinued       See Hyperspace for full Linked Orders Report.   500 mg 100 mL/hr over 60 Minutes Intravenous  Once 12/22/21 1915 12/22/21 2008   12/22/21 2030  vancomycin (VANCOCIN) IVPB 1000 mg/200 mL premix       See Hyperspace for full Linked Orders Report.   1,000 mg 200 mL/hr over 60 Minutes Intravenous  Once 12/22/21 2008 12/22/21 2237   12/22/21 2030  vancomycin (  VANCOCIN) 500 mg in sodium chloride 0.9 % 500 mL IVPB        500 mg 250 mL/hr over 120 Minutes Intravenous  Once 12/22/21 2024 12/23/21 0057   12/22/21 2030  vancomycin (VANCOCIN) 500 MG powder       Note to Pharmacy: Ivonne Andrew A: cabinet override      12/22/21 2030 12/23/21 0844   12/22/21 1915  vancomycin (VANCOCIN) 2,500 mg in sodium chloride 0.9 % 500 mL IVPB  Status:  Discontinued        2,500 mg 250 mL/hr over 120 Minutes Intravenous  Once 12/22/21 1910 12/22/21 1913   12/22/21 1915  vancomycin (VANCOCIN) IVPB 1000 mg/200 mL premix       See Hyperspace for full Linked Orders Report.   1,000 mg 200 mL/hr over 60 Minutes Intravenous  Once 12/22/21 1915 12/22/21 2131   12/22/21 1830  ceFEPIme (MAXIPIME) 2 g in sodium chloride 0.9 % 100 mL IVPB        2 g 200 mL/hr over 30 Minutes Intravenous  Once 12/22/21 1824 12/22/21 2004   12/22/21 1830  metroNIDAZOLE (FLAGYL) IVPB 500 mg        500 mg 100 mL/hr over 60 Minutes Intravenous  Once 12/22/21 1824 12/22/21 2045        Assessment/Plan POD#7 S/p I&D Left buttock abscess and candidiasis 1/6 Dr. Donell Beers - culture MRSA - continue antibiotics -  primary discussed with ID who recommended switching to Zyvox - dry dressing PRN saturation, shower with wound open -CT scan looked much improved.  -no further surgical intervention warranted.  She is stable for DC home with abx therapy, sitz bathes/showering daily, and follow up appointment has been arranged.   ID - maxipime/ vancomycin/ fluconazole stopped 1/10. Zyvox 1/10>>1/16 FEN - CM diet VTE - lovenox Foley - none   Perineal/vaginal candidiasis - now on topical antifungals Emphysematous cystitis - Ucx 40K MRSA and yeast, was on IV fluconazole (now stopped) and topical antifungals MS DM HTN OSA Morbid obesity BMI 45.94   LOS: 7 days    Letha Cape, [facility] 12/30/2021, 10:06 AM Please see Amion for pager number during day hours 7:00am-4:30pm

## 2021-12-30 NOTE — Discharge Summary (Signed)
PATIENT DETAILS Name: Katrina Montes Age: 51 y.o. Sex: female Date of Birth: 06-Dec-1971 MRN: [id number]. Admitting Physician: Kristopher Oppenheim, DO PCP:Pcp, No  Admit Date: 12/22/2021 Discharge date: 12/30/2021  Recommendations for Outpatient Follow-up:  Follow up with PCP in 1-2 weeks Please obtain CMP/CBC in one week Please ensure follow-up with general surgery Needs optimization of her insulin regimen  Admitted From:  Home  Disposition: Home with home health services   [place]:  Yes  Equipment/Devices: None  Discharge Condition: Stable  CODE STATUS: FULL CODE  Diet recommendation:  Diet Order             Diet - low sodium heart healthy           Diet Carb Modified           Diet Carb Modified Fluid consistency: Thin; Room service appropriate? Yes  Diet effective now                    Brief Summary: Patient is a 51 y.o. female with history of poorly controlled poorly controlled DM-2, HTN, MS-presented with left gluteal abscess-and hyperglycemic hyperosmolar state-started on broad-spectrum antibiotics/IV insulin-evaluated by general surgery and underwent I&D.  Patient was subsequently admitted to the hospitalist service.  See below for further details.  Brief Hospital Course: Left gluteal abscess due to MRSA: S/p I&D on 1/6-wound cultures positive for MRSA-General surgery following-discussed with ID MD Dr. Candiss Norse on 1/10-has been transitioned to Zyvox.  General surgery followed closely-repeat CT scan on 1/12 did not show any major collection.  Per general surgery-okay to discharge-recommendations are for total of 2 weeks of antimicrobial therapy.  General surgery will arrange for follow-up in the outpatient setting.   Emphysematous cystitis: Improved-afebrile-leukocytosis almost resolved-urine culture positive for MRSA/yeast-after discussion with ID-has been transitioned to Zyvox.  Previously on cefepime and  fluconazole.   Non ketotic Hyperosmolar  hyperglycemic state: No longer on IV insulin-transitioned to SQ insulin   DM-2 uncontrolled (A1c 12.4 on 1/6): Per patient/sister at bedside-A1c has been easily more than 10 for the past several years.  Apparently takes oral hypoglycemic agents and Basaglar 30 units daily-however for the past several months has been noncompliant to insulin as she was in the donut hole.  CBGs have significantly improved-continue Levemir 22 units daily, Premeal NovoLog of 6 units and SSI.  Patient will continue on metformin on discharge.  I have asked the patient to let her PCP know several weeks before she approaches her donut hole so that she can be switched to a other alternative insulin regimen (like [employer] insulin)  Perineal/vaginal candidiasis: Continue topical antifungals.   Prolonged QTC: Likely due to hypokalemia-resolved on last EKG.     Hypokalemia: Repleted.   Hypomagnesemia: Repleted.   HTN: BP stable-continue lisinopril-hold HCTZ for now.   History of multiple sclerosis: Stable-on ocrelizumab injection every 6 months.   Mood disorder: Stable-continue Cymbalta   History of migraine headaches: Continue Topamax.   OSA: CPAP nightly   Morbid Obesity Estimated body mass index is 45.94 kg/m as calculated from the following:   Height as of this encounter: $RemoveBeforeD'5\' 6"'ZJyuuzeJjxlgeN$  (1.676 m).   Weight as of this encounter: 129.1 kg.   Procedures 1/6>> I&D of left gluteal abscess.  Discharge Diagnoses:  Principal Problem:   Left buttock abscess Active Problems:   Depression   Type 2 diabetes mellitus with other specified complication (HCC)   ANEMIA-IRON DEFICIENCY   Essential hypertension   Multiple sclerosis (HCC)   OSA (  obstructive sleep apnea)   Prolonged QT interval   Emphysematous cystitis   Hypokalemia   Vaginal candidiasis   Discharge Instructions:  Activity:  As tolerated   Discharge Instructions     Call MD for:  redness, tenderness, or signs of infection (pain, swelling, redness, odor  or green/yellow discharge around incision site)   Complete by: As directed    Call MD for:  severe uncontrolled pain   Complete by: As directed    Diet - low sodium heart healthy   Complete by: As directed    Diet Carb Modified   Complete by: As directed    Discharge instructions   Complete by: As directed    Follow with Primary MD in 1-2 weeks  Please get a complete blood count and chemistry panel checked by your Primary MD at your next visit, and again as instructed by your Primary MD.  Get Medicines reviewed and adjusted: Please take all your medications with you for your next visit with your Primary MD  Laboratory/radiological data: Please request your Primary MD to go over all hospital tests and procedure/radiological results at the follow up, please ask your Primary MD to get all Hospital records sent to his/her office.  In some cases, they will be blood work, cultures and biopsy results pending at the time of your discharge. Please request that your primary care M.D. follows up on these results.  Also Note the following: If you experience worsening of your admission symptoms, develop shortness of breath, life threatening emergency, suicidal or homicidal thoughts you must seek medical attention immediately by calling 911 or calling your MD immediately  if symptoms less severe.  You must read complete instructions/literature along with all the possible adverse reactions/side effects for all the Medicines you take and that have been prescribed to you. Take any new Medicines after you have completely understood and accpet all the possible adverse reactions/side effects.   Do not drive when taking Pain medications or sleeping medications (Benzodaizepines)  Do not take more than prescribed Pain, Sleep and Anxiety Medications. It is not advisable to combine anxiety,sleep and pain medications without talking with your primary care practitioner  Special Instructions: If you have smoked  or chewed Tobacco  in the last 2 yrs please stop smoking, stop any regular Alcohol  and or any Recreational drug use.  Wear Seat belts while driving.  Please note: You were cared for by a hospitalist during your hospital stay. Once you are discharged, your primary care physician will handle any further medical issues. Please note that NO REFILLS for any discharge medications will be authorized once you are discharged, as it is imperative that you return to your primary care physician (or establish a relationship with a primary care physician if you do not have one) for your post hospital discharge needs so that they can reassess your need for medications and monitor your lab values.   1.)  Check CBGs before meals and at bedtime-keep a record of her readings and take it to your next appointment with the primary care practitioner.  2.)  Please let your primary care practitioner know several weeks ahead when you approach her donut hole-so that they have ample time to change her insulin regimen.   Discharge wound care:   Complete by: As directed    Daily and PRN saturation. Dry gauze/abd over buttock wounds   Increase activity slowly   Complete by: As directed       Allergies as of 12/30/2021  No Known Allergies      Medication List     STOP taking these medications    HYDROcodone-acetaminophen 7.5-325 MG tablet Commonly known as: Norco   Janumet 50-500 MG tablet Generic drug: sitaGLIPtin-metformin   lisinopril-hydrochlorothiazide 20-12.5 MG tablet Commonly known as: ZESTORETIC       TAKE these medications    APPLE CIDER VINEGAR PO Take 1 tablet by mouth daily.   BENADRYL ALLERGY PO Take 1 tablet by mouth as needed (allergy).   blood glucose meter kit and supplies Dispense based on patient and insurance preference. Use up to four times daily as directed. (FOR ICD-10 E10.9, E11.9).   Blood Glucose System Pak Kit Please dispense as Guide Me System. Use as directed to  monitor FSBS 3x daily. Dx: E11.9.   BLOOD GLUCOSE TEST STRIPS Strp Please dispense as Guide Me System. Use as directed to monitor FSBS 3x daily. Dx: E11.9.   clotrimazole 1 % cream Commonly known as: LOTRIMIN Apply topically 2 (two) times daily.   cyclobenzaprine 10 MG tablet Commonly known as: FLEXERIL Take 10 mg by mouth 4 (four) times daily as needed for muscle spasms.   docusate sodium 50 MG capsule Commonly known as: COLACE Take 1 capsule (50 mg total) by mouth 2 (two) times daily.   DULoxetine 60 MG capsule Commonly known as: Cymbalta Take 1 capsule (60 mg total) by mouth daily.   ELDERBERRY PO Take 1,150 mg by mouth daily.   EQL Slow Release Iron 160 (50 Fe) MG Tbcr SR tablet Generic drug: ferrous sulfate Take 1 tablet (160 mg total) by mouth daily.   ibuprofen 200 MG tablet Commonly known as: ADVIL Take 200 mg by mouth every 6 (six) hours as needed for headache, moderate pain or mild pain.   insulin degludec 100 UNIT/ML FlexTouch Pen Commonly known as: TRESIBA Inject 22 Units into the skin daily.   insulin lispro 100 UNIT/ML KwikPen Commonly known as: HUMALOG Inject 6 Units into the skin 3 (three) times daily before meals.   Insulin Pen Needle 32G X 8 MM Misc Use as directed   Lancets Misc Please dispense as Guide Me System. Use as directed to monitor FSBS 3x daily. Dx: E11.9.   linezolid 600 MG tablet Commonly known as: ZYVOX Take 1 tablet (600 mg total) by mouth every 12 (twelve) hours for 6 days.   lisinopril 20 MG tablet Commonly known as: ZESTRIL Take 1 tablet (20 mg total) by mouth daily. Start taking on: December 31, 2021   meclizine 25 MG tablet Commonly known as: ANTIVERT Take 1 tablet (25 mg total) by mouth 3 (three) times daily as needed for dizziness.   metFORMIN 500 MG tablet Commonly known as: Glucophage Take 1 tablet (500 mg total) by mouth 2 (two) times daily with a meal.   multivitamin capsule Take 1 capsule by mouth daily.    norgestimate-ethinyl estradiol 0.25-35 MG-MCG tablet Commonly known as: Estarylla Take 1 tablet by mouth daily.   nortriptyline 25 MG capsule Commonly known as: PAMELOR Take 2 capsules (50 mg total) by mouth at bedtime.   ocrelizumab 300 MG/10ML injection Commonly known as: OCREVUS Inject 300 mg into the vein every 6 (six) months.   oxyCODONE 5 MG immediate release tablet Commonly known as: Oxy IR/ROXICODONE Take 1 tablet (5 mg total) by mouth every 6 (six) hours as needed for moderate pain, severe pain or breakthrough pain.   polyethylene glycol 17 g packet Commonly known as: MIRALAX / GLYCOLAX Take 17 g by mouth  daily.   PRESCRIPTION MEDICATION Place 1 drop into both eyes as needed (unk).   rizatriptan 10 MG tablet Commonly known as: MAXALT Take 1 tablet (10 mg total) by mouth as needed for migraine. May repeat in 2 hours if needed. Max dose: 2/24 hr or 15/30 days.   topiramate 100 MG tablet Commonly known as: TOPAMAX Take 1 tablet (100 mg total) by mouth at bedtime.   venlafaxine XR 75 MG 24 hr capsule Commonly known as: EFFEXOR-XR Take 75 mg by mouth daily.   VITAMIN D PO Take 5,000 Units by mouth daily.               Durable Medical Equipment  (From admission, onward)           Start     Ordered   12/29/21 1022  For home use only DME Walker rolling  Once       Question Answer Comment  Walker: With [place] Wheels   Patient needs a walker to treat with the following condition Weakness      12/29/21 1022              Discharge Care Instructions  (From admission, onward)           Start     Ordered   12/30/21 0000  Discharge wound care:       Comments: Daily and PRN saturation. Dry gauze/abd over buttock wounds   12/30/21 7048            Follow-up Information     Ladell Pier, MD Follow up.   Specialty: Internal Medicine Why: TIME: 9:30 AM DATE: Jacqulyn Ducking DOCTOR Karle Plumber Contact information: [place] [address] [phone]         [facility], [facility] Follow up.   Specialty: Home Health Services Why: HH-PT arranged. They will contact you 1 to 2 days post discharge. Contact information: [place] [address] [phone]         Sur[facility], [place] Follow up.   Specialty: General Surgery Why: Hospital follow up, Office will call with date/time, If you dont hear from them,please give them a call Contact information: [place] [address] [phone]                No Known Allergies    Consultations:  general surgery   Other Procedures/Studies: CT ABDOMEN PELVIS W CONTRAST  Result Date: 12/29/2021 CLINICAL DATA:  Worsening perirectal and perineal pain. Recent left buttock abscess incision and drainage. EXAM: CT ABDOMEN AND PELVIS WITH CONTRAST TECHNIQUE: Multidetector CT imaging of the abdomen and pelvis was performed using the standard protocol following bolus administration of intravenous contrast. CONTRAST:  55mL OMNIPAQUE IOHEXOL 300 MG/ML  SOLN COMPARISON:  CT abdomen and pelvis dated December 22, 2021. FINDINGS: Lower chest: No acute abnormality. Hepatobiliary: No new focal liver abnormality. Hemangioma in the posterior right hepatic lobe is not well visualized on today's study. Unchanged gallbladder sludge. No gallbladder wall thickening or biliary dilatation. Pancreas: Unremarkable. No pancreatic ductal dilatation or surrounding inflammatory changes. Spleen: Normal in size without focal abnormality. Adrenals/Urinary Tract: Adrenal glands are unremarkable. Kidneys are normal, without renal calculi, focal lesion, or hydronephrosis. Bladder is unremarkable. Stomach/Bowel: Stomach is within normal limits. Appendix appears normal. No evidence of bowel wall thickening, distention, or inflammatory changes. Vascular/Lymphatic: No significant vascular findings are present. No enlarged  abdominal or pelvic lymph nodes.  Reproductive: Uterus and bilateral adnexa are unremarkable. Other: No abdominal wall hernia or abnormality. No abdominopelvic ascites. No pneumoperitoneum. Musculoskeletal: New surgical drain in the medial left buttock along the gluteal cleft with decreased ill-defined fluid and mildly improved inflammatory changes. No discrete fluid collection. No subcutaneous emphysema. No acute or significant osseous findings. IMPRESSION: 1. New surgical drain in the medial left buttock along the gluteal cleft with decreased ill-defined fluid and mildly improved inflammatory changes compared to prior study. No discrete fluid collection. Electronically Signed   By: Titus Dubin M.D.   On: 12/29/2021 15:56   CT ABDOMEN PELVIS W CONTRAST  Result Date: 12/22/2021 CLINICAL DATA:  Left lower quadrant abdominal pain. Left buttocks abscess. EXAM: CT ABDOMEN AND PELVIS WITH CONTRAST TECHNIQUE: Multidetector CT imaging of the abdomen and pelvis was performed using the standard protocol following bolus administration of intravenous contrast. CONTRAST:  196mL OMNIPAQUE IOHEXOL 300 MG/ML  SOLN COMPARISON:  None. FINDINGS: Lower chest: No acute abnormality. Hepatobiliary: There is a heterogeneously enhancing lesion in the posterior right lobe of the liver measuring 2.4 by 1.6 cm which is more homogeneous with surrounding liver on delayed imaging most compatible with hepatic hemangioma. Liver and bile ducts are within normal limits. Pancreas: Unremarkable. No pancreatic ductal dilatation or surrounding inflammatory changes. Spleen: Normal in size without focal abnormality. Adrenals/Urinary Tract: The kidneys and adrenal glands are within normal limits. There is mild bladder wall emphysema. There is a tiny amount of extraluminal gas image 2/80. Stomach/Bowel: Stomach is within normal limits. Appendix appears normal. No evidence of bowel wall thickening, distention, or inflammatory changes. There is colonic  diverticulosis without evidence for acute diverticulitis. Vascular/Lymphatic: IVC and aorta are normal in size. There are enlarged left inguinal lymph nodes measuring up to 12 mm short axis. Reproductive: Uterus and bilateral adnexa are unremarkable. Other: There is no ascites or focal abdominal wall hernia. There is marked subcutaneous edema in the medial left buttocks near the gluteal cleft extending to the level of the left rectum. There are no rim enhancing fluid collections. There is no soft tissue gas in this region. Ill-defined fluid seen near the left gluteal cleft posteriorly image 2/89 measuring 4.2 x 3.6 x 3.9 cm and posterior to the left rectum/anus image 2/97 measuring 7.4 x 2.4 by 2.6 cm. Musculoskeletal: There are degenerative changes at L4-L5. No acute fractures are seen. No osseous erosions are identified. IMPRESSION: 1. Subcutaneous fluid and stranding in the left buttock extending to the rectum. Two ill-defined fluid collections are identified in this region which may represent phlegmon or early abscess formation. No rim enhancing collections are identified. No evidence for soft tissue gas in this region. 2. Findings worrisome for emphysematous cystitis. There is also small amount of extraluminal gas near the bladder, indeterminate. Small amount of bladder perforation can not be excluded. Electronically Signed   By: Ronney Asters M.D.   On: 12/22/2021 20:13   DG Chest Port 1 View  Result Date: 12/22/2021 CLINICAL DATA:  Sepsis EXAM: PORTABLE CHEST 1 VIEW COMPARISON:  None. FINDINGS: Lungs are clear. No pneumothorax or pleural effusion. Cardiac size within normal limits. Pulmonary vascularity is normal. Osseous structures are age-appropriate. No acute bone abnormality. IMPRESSION: No active disease. Electronically Signed   By: Fidela Salisbury M.D.   On: 12/22/2021 18:52   CT US GUIDE VASC ACCESS RT NO REPORT  Result Date: 12/29/2021 There is no Radiologist interpretation  for this exam.     TODAY-DAY OF DISCHARGE:  Subjective:   Jaci Standard  today has no headache,no chest abdominal pain,no new weakness tingling or numbness, feels much better wants to go home today.   Objective:   Blood pressure 123/72, pulse 90, temperature 97.6 F (36.4 C), temperature source Oral, resp. rate 18, height 5\' 6"  (1.676 m), weight 129.1 kg, last menstrual period 12/12/2021, SpO2 97 %. No intake or output data in the 24 hours ending 12/30/21 0951 Filed Weights   12/22/21 1751 12/23/21 1457  Weight: 127 kg 129.1 kg    Exam: Awake Alert, Oriented *3, No new F.N deficits, Normal affect [place].AT,PERRAL Supple Neck,No JVD, No cervical lymphadenopathy appriciated.  Symmetrical Chest wall movement, Good air movement bilaterally, CTAB RRR,No Gallops,Rubs or new Murmurs, No Parasternal Heave +ve B.Sounds, Abd Soft, Non tender, No organomegaly appriciated, No rebound -guarding or rigidity. No Cyanosis, Clubbing or edema, No new Rash or bruise   PERTINENT RADIOLOGIC STUDIES: CT ABDOMEN PELVIS W CONTRAST  Result Date: 12/29/2021 CLINICAL DATA:  Worsening perirectal and perineal pain. Recent left buttock abscess incision and drainage. EXAM: CT ABDOMEN AND PELVIS WITH CONTRAST TECHNIQUE: Multidetector CT imaging of the abdomen and pelvis was performed using the standard protocol following bolus administration of intravenous contrast. CONTRAST:  32mL OMNIPAQUE IOHEXOL 300 MG/ML  SOLN COMPARISON:  CT abdomen and pelvis dated December 22, 2021. FINDINGS: Lower chest: No acute abnormality. Hepatobiliary: No new focal liver abnormality. Hemangioma in the posterior right hepatic lobe is not well visualized on today's study. Unchanged gallbladder sludge. No gallbladder wall thickening or biliary dilatation. Pancreas: Unremarkable. No pancreatic ductal dilatation or surrounding inflammatory changes. Spleen: Normal in size without focal abnormality. Adrenals/Urinary Tract: Adrenal glands are unremarkable. Kidneys  are normal, without renal calculi, focal lesion, or hydronephrosis. Bladder is unremarkable. Stomach/Bowel: Stomach is within normal limits. Appendix appears normal. No evidence of bowel wall thickening, distention, or inflammatory changes. Vascular/Lymphatic: No significant vascular findings are present. No enlarged abdominal or pelvic lymph nodes. Reproductive: Uterus and bilateral adnexa are unremarkable. Other: No abdominal wall hernia or abnormality. No abdominopelvic ascites. No pneumoperitoneum. Musculoskeletal: New surgical drain in the medial left buttock along the gluteal cleft with decreased ill-defined fluid and mildly improved inflammatory changes. No discrete fluid collection. No subcutaneous emphysema. No acute or significant osseous findings. IMPRESSION: 1. New surgical drain in the medial left buttock along the gluteal cleft with decreased ill-defined fluid and mildly improved inflammatory changes compared to prior study. No discrete fluid collection. Electronically Signed   By: December 24, 2021 M.D.   On: 12/29/2021 15:56   CT 02/26/2022 GUIDE VASC ACCESS RT NO REPORT  Result Date: 12/29/2021 There is no Radiologist interpretation  for this exam.    PERTINENT LAB RESULTS: CBC: Recent Labs    12/29/21 0129 12/30/21 0102  WBC 13.2* 10.6*  HGB 10.0* 10.2*  HCT 30.8* 31.7*  PLT 499* 502*   CMET CMP     Component Value Date/Time   NA 136 12/26/2021 0212   NA 137 07/19/2020 0813   K 4.1 12/26/2021 0212   CL 104 12/26/2021 0212   CO2 25 12/26/2021 0212   GLUCOSE 127 (H) 12/26/2021 0212   BUN 13 12/26/2021 0212   BUN 12 07/19/2020 0813   CREATININE 0.60 12/26/2021 0212   CREATININE 0.67 11/19/2020 0931   CALCIUM 8.5 (L) 12/26/2021 0212   PROT 8.4 (H) 12/22/2021 1823   PROT 7.2 07/19/2020 0813   ALBUMIN 3.7 12/22/2021 1823   ALBUMIN 3.8 07/19/2020 0813   AST 10 (L) 12/22/2021 1823   ALT 10 12/22/2021 1823  ALKPHOS 157 (H) 12/22/2021 1823   BILITOT 0.8 12/22/2021 1823    BILITOT 0.4 07/19/2020 0813   GFRNONAA >60 12/26/2021 0212   GFRAA 120 07/19/2020 0813    GFR Estimated Creatinine Clearance: 117.1 mL/min (by C-G formula based on SCr of 0.6 mg/dL). No results for input(s): LIPASE, AMYLASE in the last 72 hours. No results for input(s): CKTOTAL, CKMB, CKMBINDEX, TROPONINI in the last 72 hours. Invalid input(s): POCBNP No results for input(s): DDIMER in the last 72 hours. No results for input(s): HGBA1C in the last 72 hours. No results for input(s): CHOL, HDL, LDLCALC, TRIG, CHOLHDL, LDLDIRECT in the last 72 hours. No results for input(s): TSH, T4TOTAL, T3FREE, THYROIDAB in the last 72 hours.  Invalid input(s): FREET3 No results for input(s): VITAMINB12, FOLATE, FERRITIN, TIBC, IRON, RETICCTPCT in the last 72 hours. Coags: No results for input(s): INR in the last 72 hours.  Invalid input(s): PT Microbiology: Recent Results (from the past 240 hour(s))  Blood Culture (routine x 2)     Status: None   Collection Time: 12/22/21  6:05 PM   Specimen: BLOOD  Result Value Ref Range Status   Specimen Description   Final    BLOOD LEFT ANTECUBITAL Performed at [facility], [address], [place], [place] [address]    Special Requests   Final    Blood Culture adequate volume BOTTLES DRAWN AEROBIC AND ANAEROBIC Performed at [facility], [address] [place], [place] [address]    Culture   Final    NO GROWTH 5 DAYS Performed at [facility], [address], [place], [place] [address]    Report Status 12/27/2021 FINAL  Final  Blood Culture (routine x 2)     Status: None   Collection Time: 12/22/21  7:00 PM   Specimen: BLOOD  Result Value Ref Range Status   Specimen Description   Final    BLOOD BLOOD RIGHT FOREARM Performed at [facility], [address], [place], [place] [address]    Special Requests   Final    Blood Culture adequate volume BOTTLES DRAWN AEROBIC AND  ANAEROBIC Performed at [facility], [address], [place], [place] [address]    Culture   Final    NO GROWTH 5 DAYS Performed at [facility], [address], [place], [place] [address]    Report Status 12/27/2021 FINAL  Final  Resp Panel by RT-PCR (Flu A&B, Covid) Nasopharyngeal Swab     Status: None   Collection Time: 12/22/21  7:04 PM   Specimen: Nasopharyngeal Swab; Nasopharyngeal(NP) swabs in vial transport medium  Result Value Ref Range Status   SARS Coronavirus 2 by RT PCR NEGATIVE NEGATIVE Final    Comment: (NOTE) SARS-CoV-2 target nucleic acids are NOT DETECTED.  The SARS-CoV-2 RNA is generally detectable in upper respiratory specimens during the acute phase of infection. The lowest concentration of SARS-CoV-2 viral copies this assay can detect is 138 copies/mL. A negative result does not preclude SARS-Cov-2 infection and should not be used as the sole basis for treatment or other patient management decisions. A negative result may occur with  improper specimen collection/handling, submission of specimen other than nasopharyngeal swab, presence of viral mutation(s) within the areas targeted by this assay, and inadequate number of viral copies(<138 copies/mL). A negative result must be combined with clinical observations, patient history, and epidemiological information. The expected result is Negative.  Fact Sheet for Patients:  BloggerCourse.com  Fact Sheet for Healthcare Providers:  IncredibleEmployment.be  This test is no t yet approved or cleared by the [place] and  has been authorized for detection and/or diagnosis of SARS-CoV-2 by FDA under an Emergency Use Authorization (EUA). This EUA will remain  in effect (meaning this test can be used) for the duration of the COVID-19 declaration under Section 564(b)(1) of the Act, 21 U.S.C.section 360bbb-3(b)(1), unless the authorization is  terminated  or revoked sooner.       Influenza A by PCR NEGATIVE NEGATIVE Final   Influenza B by PCR NEGATIVE NEGATIVE Final    Comment: (NOTE) The Xpert Xpress SARS-CoV-2/FLU/RSV plus assay is intended as an aid in the diagnosis of influenza from Nasopharyngeal swab specimens and should not be used as a sole basis for treatment. Nasal washings and aspirates are unacceptable for Xpert Xpress SARS-CoV-2/FLU/RSV testing.  Fact Sheet for Patients: EntrepreneurPulse.com.au  Fact Sheet for Healthcare Providers: IncredibleEmployment.be  This test is not yet approved or cleared by the [place] FDA and has been authorized for detection and/or diagnosis of SARS-CoV-2 by FDA under an Emergency Use Authorization (EUA). This EUA will remain in effect (meaning this test can be used) for the duration of the COVID-19 declaration under Section 564(b)(1) of the Act, 21 U.S.C. section 360bbb-3(b)(1), unless the authorization is terminated or revoked.  Performed at [facility], [address], [place], [place] [address]   Urine Culture     Status: Abnormal   Collection Time: 12/23/21  3:43 PM   Specimen: Urine, Clean Catch  Result Value Ref Range Status   Specimen Description URINE, CLEAN CATCH  Final   Special Requests   Final    NONE Performed at [facility], [place] [address], [place], [place] [address]    Culture (A)  Final    40,000 COLONIES/mL METHICILLIN RESISTANT STAPHYLOCOCCUS AUREUS 20,000 COLONIES/mL YEAST    Report Status 12/26/2021 FINAL  Final   Organism ID, Bacteria METHICILLIN RESISTANT STAPHYLOCOCCUS AUREUS (A)  Final      Susceptibility   Methicillin resistant staphylococcus aureus - MIC*    CIPROFLOXACIN >=8 RESISTANT Resistant     GENTAMICIN <=0.5 SENSITIVE Sensitive     NITROFURANTOIN <=16 SENSITIVE Sensitive     OXACILLIN >=4 RESISTANT Resistant     TETRACYCLINE >=16 RESISTANT Resistant      VANCOMYCIN <=0.5 SENSITIVE Sensitive     TRIMETH/SULFA >=320 RESISTANT Resistant     CLINDAMYCIN <=0.25 SENSITIVE Sensitive     RIFAMPIN <=0.5 SENSITIVE Sensitive     Inducible Clindamycin NEGATIVE Sensitive     * 40,000 COLONIES/mL METHICILLIN RESISTANT STAPHYLOCOCCUS AUREUS  Surgical PCR screen     Status: Abnormal   Collection Time: 12/23/21  5:17 PM   Specimen: Nasal Mucosa; Nasal Swab  Result Value Ref Range Status   MRSA, PCR POSITIVE (A) NEGATIVE Final    Comment: RESULT SENT TO PAMELA HAWKS $RemoveBefo'@2104'spLnprEMoiR$  12/23/21   Staphylococcus aureus POSITIVE (A) NEGATIVE Final    Comment: (NOTE) The Xpert SA Assay (FDA approved for NASAL specimens in patients 35 years of age and older), is one component of a comprehensive surveillance program. It is not intended to diagnose infection nor to guide or monitor treatment. Performed at [facility], [place] [address], [place], [place] [address]   Aerobic/Anaerobic Culture w Gram Stain (surgical/deep wound)     Status: None   Collection Time: 12/23/21  8:28 PM   Specimen: Abscess  Result Value Ref Range Status   Specimen Description ABSCESS  Final  Special Requests LEFT BUTTOCK  Final   Gram Stain   Final    MODERATE WBC PRESENT,BOTH PMN AND MONONUCLEAR MODERATE GRAM POSITIVE COCCI    Culture   Final    ABUNDANT METHICILLIN RESISTANT STAPHYLOCOCCUS AUREUS RARE PREVOTELLA BIVIA BETA LACTAMASE POSITIVE Performed at [facility], [address] [place], [place] [address]    Report Status 12/27/2021 FINAL  Final   Organism ID, Bacteria METHICILLIN RESISTANT STAPHYLOCOCCUS AUREUS  Final      Susceptibility   Methicillin resistant staphylococcus aureus - MIC*    CIPROFLOXACIN 4 RESISTANT Resistant     ERYTHROMYCIN >=8 RESISTANT Resistant     GENTAMICIN <=0.5 SENSITIVE Sensitive     OXACILLIN >=4 RESISTANT Resistant     TETRACYCLINE >=16 RESISTANT Resistant     VANCOMYCIN <=0.5 SENSITIVE Sensitive     TRIMETH/SULFA 160 RESISTANT  Resistant     CLINDAMYCIN <=0.25 SENSITIVE Sensitive     RIFAMPIN <=0.5 SENSITIVE Sensitive     Inducible Clindamycin NEGATIVE Sensitive     * ABUNDANT METHICILLIN RESISTANT STAPHYLOCOCCUS AUREUS    FURTHER DISCHARGE INSTRUCTIONS:  Get Medicines reviewed and adjusted: Please take all your medications with you for your next visit with your Primary MD  Laboratory/radiological data: Please request your Primary MD to go over all hospital tests and procedure/radiological results at the follow up, please ask your Primary MD to get all Hospital records sent to his/her office.  In some cases, they will be blood work, cultures and biopsy results pending at the time of your discharge. Please request that your primary care M.D. goes through all the records of your hospital data and follows up on these results.  Also Note the following: If you experience worsening of your admission symptoms, develop shortness of breath, life threatening emergency, suicidal or homicidal thoughts you must seek medical attention immediately by calling 911 or calling your MD immediately  if symptoms less severe.  You must read complete instructions/literature along with all the possible adverse reactions/side effects for all the Medicines you take and that have been prescribed to you. Take any new Medicines after you have completely understood and accpet all the possible adverse reactions/side effects.   Do not drive when taking Pain medications or sleeping medications (Benzodaizepines)  Do not take more than prescribed Pain, Sleep and Anxiety Medications. It is not advisable to combine anxiety,sleep and pain medications without talking with your primary care practitioner  Special Instructions: If you have smoked or chewed Tobacco  in the last 2 yrs please stop smoking, stop any regular Alcohol  and or any Recreational drug use.  Wear Seat belts while driving.  Please note: You were cared for by a hospitalist during  your hospital stay. Once you are discharged, your primary care physician will handle any further medical issues. Please note that NO REFILLS for any discharge medications will be authorized once you are discharged, as it is imperative that you return to your primary care physician (or establish a relationship with a primary care physician if you do not have one) for your post hospital discharge needs so that they can reassess your need for medications and monitor your lab values.  Total Time spent coordinating discharge including counseling, education and face to face time equals 35 minutes.  SignedOren Binet 12/30/2021 9:51 AM

## 2022-01-24 ENCOUNTER — Encounter: Payer: Self-pay | Admitting: Nurse Practitioner

## 2022-01-25 ENCOUNTER — Telehealth: Payer: Self-pay

## 2022-01-25 NOTE — Telephone Encounter (Signed)
Please advise, pt is scheduled for a new patient appointment.

## 2022-01-25 NOTE — Telephone Encounter (Signed)
Pt sent mychart message to inquire about if fola will see about her surgical site at new patient appt per fola pt needs to reach out to surgeon's office called to relay info no answer left vm. Sent pt mychart

## 2022-01-27 ENCOUNTER — Other Ambulatory Visit: Payer: Self-pay

## 2022-01-27 ENCOUNTER — Ambulatory Visit (INDEPENDENT_AMBULATORY_CARE_PROVIDER_SITE_OTHER): Payer: Medicare Other | Admitting: Nurse Practitioner

## 2022-01-27 ENCOUNTER — Encounter: Payer: Self-pay | Admitting: Nurse Practitioner

## 2022-01-27 VITALS — BP 130/72 | HR 83 | Ht 66.0 in | Wt 263.0 lb

## 2022-01-27 DIAGNOSIS — E1165 Type 2 diabetes mellitus with hyperglycemia: Secondary | ICD-10-CM | POA: Diagnosis not present

## 2022-01-27 DIAGNOSIS — D509 Iron deficiency anemia, unspecified: Secondary | ICD-10-CM | POA: Diagnosis not present

## 2022-01-27 DIAGNOSIS — Z23 Encounter for immunization: Secondary | ICD-10-CM | POA: Diagnosis not present

## 2022-01-27 DIAGNOSIS — L0231 Cutaneous abscess of buttock: Secondary | ICD-10-CM

## 2022-01-27 DIAGNOSIS — Z1211 Encounter for screening for malignant neoplasm of colon: Secondary | ICD-10-CM | POA: Diagnosis not present

## 2022-01-27 DIAGNOSIS — G35 Multiple sclerosis: Secondary | ICD-10-CM

## 2022-01-27 DIAGNOSIS — G629 Polyneuropathy, unspecified: Secondary | ICD-10-CM

## 2022-01-27 DIAGNOSIS — G43709 Chronic migraine without aura, not intractable, without status migrainosus: Secondary | ICD-10-CM

## 2022-01-27 DIAGNOSIS — I10 Essential (primary) hypertension: Secondary | ICD-10-CM

## 2022-01-27 DIAGNOSIS — E559 Vitamin D deficiency, unspecified: Secondary | ICD-10-CM

## 2022-01-27 MED ORDER — INSULIN DEGLUDEC 100 UNIT/ML ~~LOC~~ SOPN: 22.0000 [IU] | PEN_INJECTOR | Freq: Every day | SUBCUTANEOUS | 11 refills | Status: DC

## 2022-01-27 MED ORDER — GABAPENTIN 100 MG PO CAPS: 100.0000 mg | ORAL_CAPSULE | Freq: Three times a day (TID) | ORAL | 3 refills | Status: DC

## 2022-01-27 MED ORDER — EQL SLOW RELEASE IRON 160 (50 FE) MG PO TBCR: 160.0000 mg | EXTENDED_RELEASE_TABLET | Freq: Every day | ORAL | 3 refills | Status: DC

## 2022-01-27 MED ORDER — INSULIN LISPRO (1 UNIT DIAL) 100 UNIT/ML (KWIKPEN): 6.0000 [IU] | PEN_INJECTOR | Freq: Three times a day (TID) | SUBCUTANEOUS | 11 refills | Status: DC

## 2022-01-27 MED ORDER — LISINOPRIL 20 MG PO TABS: 20.0000 mg | ORAL_TABLET | Freq: Every day | ORAL | 11 refills | Status: DC

## 2022-01-27 MED ORDER — METFORMIN HCL 500 MG PO TABS: 500.0000 mg | ORAL_TABLET | Freq: Two times a day (BID) | ORAL | 11 refills | Status: DC

## 2022-01-27 NOTE — Patient Instructions (Addendum)
Please schedule your diabetic eye exam today Get your flu vaccine today.  Get your covid booster at your pharmacy.   Goal for fasting blood sugar ranges from 80 to 120 and 2 hours after any meal or at bedtime should be between 130 to 170.    It is important that you exercise regularly at least 30 minutes 5 times a week.  Think about what you will eat, plan ahead. Choose " clean, green, fresh or frozen" over canned, processed or packaged foods which are more sugary, salty and fatty. 70 to 75% of food eaten should be vegetables and fruit. Three meals at set times with snacks allowed between meals, but they must be fruit or vegetables. Aim to eat over a 12 hour period , example 7 am to 7 pm, and STOP after  your last meal of the day. Drink water,generally about 64 ounces per day, no other drink is as healthy. Fruit juice is best enjoyed in a healthy way, by EATING the fruit.  Thanks for choosing [facility], we consider it a privelige to serve you.

## 2022-01-27 NOTE — Assessment & Plan Note (Signed)
Starts gabapentin 100 mg 3 times daily

## 2022-01-27 NOTE — Assessment & Plan Note (Signed)
Importance of healthy food choices with portion control discussed as well as eating regularly within 12  hour window.   The need to choose clean green food 50%-75% of time is discussed as well as make water the primary drink and set a goal for 64 ounces daily.  Patient reeducated about the importance of committment to minimum of 150 minutes of exercise per week.  Three meals at set times with snacks allowed between meals but they must be fruit or vegetable.   Aim to eat  over 12 hour period  for example 7 am to 7 pm. Stop after your last meal of the day.  Wt Readings from Last 3 Encounters:  01/27/22 263 lb (119.3 kg)  12/23/21 284 lb 9.8 oz (129.1 kg)  08/01/21 279 lb (126.6 kg)

## 2022-01-27 NOTE — Progress Notes (Signed)
New Patient Office Visit  Subjective:  Patient ID: Katrina Montes, female    DOB: November 19, 1971  Age: 51 y.o. MRN: [id number]  CC:  Chief Complaint  Patient presents with   New Patient (Initial Visit)    NP    HPI [name] presents to establish care. Previous PCP  Dr Buelah Manis at [facility].  Had Surgery for abscess on her buttocks in January 2023. She had a follow up today, was told that the wound is healing well.   MS . Followed by neurology Dr. Krista Blue.  Ocrelizumab injection  Anemia. Ferrous sulfate. Sometimes has heavy menstrual bleeding.  T2DM Tresiba 22 units daily, Humalog 6 units TID. Metformin $RemoveBefore'500mg'kHLSYZEfFvDdM$  BID. Not on statin. She she has started taking her diabetic medication since she had her surgery.  Prior to that she was not taking her medication as she was supposed.  Vitamin D deff.   Takes vitamin d 1000 UNITS daily HTN. Lisinopril $RemoveBeforeD'20mg'BdmyEuQTubRQTe$  daily  Migraine Headache.  Takes Topamax 100 mg at bedtime, nortriptyline,  Maxalt while 10 mg as needed  Neuropathy pt c/o constant burning sensation, tingling  in her both starting since last month, its worse at night time. Elevating her legs not working.  Past Medical History:  Diagnosis Date   Anemia    Anxiety    Back pain    Carpal tunnel syndrome    Constipation    Depression    Diabetes mellitus    Hypertension    Joint pain    Lactose intolerance    Multiple sclerosis (HCC)    Optic neuritis    Shortness of breath    Swallowing difficulty    Swelling of both lower extremities    Vision, loss, sudden    Vitamin D deficiency     Past Surgical History:  Procedure Laterality Date   BREAST REDUCTION SURGERY     CYST EXCISION     INCISION AND DRAINAGE ABSCESS Left 12/23/2021   Procedure: INCISION AND DRAINAGE BUTTOCK ABSCESS;  Surgeon: Stark Klein, MD;  Location: [facility];  Service: General;  Laterality: Left;   ovarian cyst removed     TUBAL LIGATION     WISDOM TOOTH EXTRACTION      Family History   Problem Relation Age of Onset   Cancer Mother    Diabetes Mother    Hearing loss Mother    Thyroid nodules Mother    Anemia Mother    Kidney disease Mother    Hypertension Mother    Obesity Mother    Hyperlipidemia Father    High blood pressure Father    Sleep apnea Father    Anemia Sister    Arthritis Maternal Grandmother    Arthritis Maternal Grandfather    Arthritis Paternal Grandmother    Kidney disease Paternal Grandmother    Hypertension Paternal Grandmother    Arthritis Paternal Grandfather    Vision loss Paternal Grandfather    Stroke Paternal Grandfather    Hyperlipidemia Paternal Grandfather    Anemia Sister     Social History   Socioeconomic History   Marital status: Single    Spouse name: Not on file   Number of children: 0   Years of education: College   Highest education level: Not on file  Occupational History   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   Right handed.   Uses caffeine sparingly.   Social Determinants of Health   Financial Resource Strain: Not on file  Food Insecurity: Not on file  Transportation Needs: Not on file  Physical Activity: Not on file  Stress: Not on file  Social Connections: Not on file  Intimate Partner Violence: Not on file    ROS Review of Systems  Constitutional: Negative.   Respiratory: Negative.    Cardiovascular: Negative.   Gastrointestinal: Negative.   Neurological:  Negative for dizziness, facial asymmetry, light-headedness and headaches.       Has burning sensation on both feet  Psychiatric/Behavioral: Negative.     Objective:   Today's Vitals: BP 130/72 (BP Location: Left Arm, Patient Position: Sitting, Cuff Size:  Normal)    Pulse 83    Ht $R'5\' 6"'HF$  (1.676 m)    Wt 263 lb (119.3 kg)    LMP 01/10/2022 (Exact Date)    SpO2 100%    BMI 42.45 kg/m   Physical Exam Constitutional:      General: She is not in acute distress.    Appearance: She is obese. She is not ill-appearing, toxic-appearing or diaphoretic.  Cardiovascular:     Rate and Rhythm: Normal rate and regular rhythm.     Pulses: Normal pulses.     Heart sounds: Normal heart sounds. No murmur heard.   No friction rub. No gallop.  Pulmonary:     Effort: Pulmonary effort is normal. No respiratory distress.     Breath sounds: Normal breath sounds. No stridor. No wheezing, rhonchi or rales.  Chest:     Chest wall: No tenderness.  Abdominal:     Palpations: Abdomen is soft.  Neurological:     Mental Status: She is alert and oriented to person, place, and time.     Cranial Nerves: No cranial nerve deficit.     Sensory: No sensory deficit.     Motor: No weakness.     Coordination: Coordination normal.     Gait: Gait normal.     Deep Tendon Reflexes: Reflexes normal.  Psychiatric:        Mood and Affect: Mood normal.        Behavior: Behavior normal.        Thought Content: Thought content normal.        Judgment: Judgment normal.    Assessment & Plan:   Problem List Items Addressed This Visit   None   Outpatient Encounter Medications as of 01/27/2022  Medication Sig   Accu-Chek Softclix Lancets lancets Use as directed.   APPLE CIDER VINEGAR PO Take 1 tablet by mouth daily.   blood glucose meter kit and supplies Dispense based on patient and insurance preference. Use up to four times daily as directed. (FOR ICD-10 E10.9, E11.9).   Blood Glucose Monitoring Suppl (ACCU-CHEK GUIDE ME) w/Device KIT Use as directed to check blood glucose   Blood Glucose Monitoring Suppl (BLOOD GLUCOSE SYSTEM PAK) KIT Please dispense as Guide Me System. Use as directed to monitor FSBS 3x daily. Dx: E11.9.   clotrimazole (LOTRIMIN) 1 % cream Apply topically 2  (two) times daily.   cyclobenzaprine (FLEXERIL) 10 MG tablet Take 10 mg by mouth 4 (four) times daily as needed for muscle spasms.   DiphenhydrAMINE HCl (BENADRYL ALLERGY PO) Take 1 tablet by  mouth as needed (allergy).   docusate sodium (COLACE) 50 MG capsule Take 1 capsule (50 mg total) by mouth 2 (two) times daily.   DULoxetine (CYMBALTA) 60 MG capsule Take 1 capsule (60 mg total) by mouth daily.   ELDERBERRY PO Take 1,150 mg by mouth daily.   ferrous sulfate (EQL SLOW RELEASE IRON) 160 (50 Fe) MG TBCR SR tablet Take 1 tablet (160 mg total) by mouth daily.   glucose blood (ACCU-CHEK GUIDE) test strip Use as instructed up to 6 times daily   Glucose Blood (BLOOD GLUCOSE TEST STRIPS) STRP Please dispense as Guide Me System. Use as directed to monitor FSBS 3x daily. Dx: E11.9.   ibuprofen (ADVIL,MOTRIN) 200 MG tablet Take 200 mg by mouth every 6 (six) hours as needed for headache, moderate pain or mild pain.   insulin degludec (TRESIBA) 100 UNIT/ML FlexTouch Pen Inject 22 Units into the skin daily.   insulin lispro (HUMALOG) 100 UNIT/ML KwikPen Inject 6 Units into the skin 3 (three) times daily before meals.   Insulin Pen Needle 32G X 4 MM MISC Use as directed   Lancets MISC Please dispense as Guide Me System. Use as directed to monitor FSBS 3x daily. Dx: E11.9.   lisinopril (ZESTRIL) 20 MG tablet Take 1 tablet (20 mg total) by mouth daily.   meclizine (ANTIVERT) 25 MG tablet Take 1 tablet (25 mg total) by mouth 3 (three) times daily as needed for dizziness.   metFORMIN (GLUCOPHAGE) 500 MG tablet Take 1 tablet (500 mg total) by mouth 2 (two) times daily with a meal.   Multiple Vitamin (MULTIVITAMIN) capsule Take 1 capsule by mouth daily.   norgestimate-ethinyl estradiol (ESTARYLLA) 0.25-35 MG-MCG tablet Take 1 tablet by mouth daily.   ocrelizumab (OCREVUS) 300 MG/10ML injection Inject 300 mg into the vein every 6 (six) months.   oxyCODONE (OXY IR/ROXICODONE) 5 MG immediate release tablet Take 1  tablet (5 mg total) by mouth every 6 (six) hours as needed for moderate pain, severe pain or breakthrough pain.   polyethylene glycol powder (GLYCOLAX/MIRALAX) 17 GM/SCOOP powder Take 1 capful (17 g) in water by mouth daily.   PRESCRIPTION MEDICATION Place 1 drop into both eyes as needed (unk).   rizatriptan (MAXALT) 10 MG tablet Take 1 tablet (10 mg total) by mouth as needed for migraine. May repeat in 2 hours if needed. Max dose: 2/24 hr or 15/30 days.   topiramate (TOPAMAX) 100 MG tablet Take 1 tablet (100 mg total) by mouth at bedtime.   venlafaxine XR (EFFEXOR-XR) 75 MG 24 hr capsule Take 75 mg by mouth daily.   VITAMIN D PO Take 5,000 Units by mouth daily.   [DISCONTINUED] linezolid (ZYVOX) 600 MG tablet    nortriptyline (PAMELOR) 25 MG capsule Take 2 capsules (50 mg total) by mouth at bedtime. (Patient not taking: Reported on 01/27/2022)   No facility-administered encounter medications on file as of 01/27/2022.    Follow-up: No follow-ups on file.   Renee Rival, FNP

## 2022-01-27 NOTE — Assessment & Plan Note (Signed)
She had a follow-up visit with the surgeon today. She was told that her wound is healing well. Importance of getting diabetes under control discussed with patient she verbalized understanding.

## 2022-01-27 NOTE — Assessment & Plan Note (Addendum)
Lab Results  Component Value Date   HGBA1C 12.4 (H) 12/23/2021   Takes Humalog 60 units 3 times daily Tresiba 22 units daily.  Metformin 500 mg twice daily Not on statin. Check lipid panel today. Takes lisinopril 20 mg daily. Diabetic eye exams ordered today Foot exam completed today. Patient referred to endocrinologist for management of diabetes. Patient referred to dietitian for diabetic education

## 2022-01-27 NOTE — Assessment & Plan Note (Signed)
Patient educated on CDC recommendation for the vaccine. Verbal consent was obtained from the patient, vaccine administered by nurse, no sign of adverse reactions noted at this time. Patient education on arm soreness and use of tylenol or ibuprofen (if safe) for this patient  was discussed. Patient educated on the signs and symptoms of adverse effect and advise to contact the office if they occur.t.  °

## 2022-01-27 NOTE — Assessment & Plan Note (Addendum)
Followed by neurology Dr. Terrace Arabia, takes  Ocrelizumab injection

## 2022-01-27 NOTE — Assessment & Plan Note (Signed)
Takes vitamin D supplement. We will check vitamin D levels next visit.

## 2022-01-27 NOTE — Assessment & Plan Note (Signed)
Migraine Headache.  Takes Topamax 100 mg at bedtime, nortriptyline,  Maxalt while 10 mg as needed. Followed by neurology

## 2022-01-27 NOTE — Assessment & Plan Note (Signed)
Chronic condition takes iron supplements, medication refilled today. Lab Results  Component Value Date   WBC 10.6 (H) 12/30/2021   HGB 10.2 (L) 12/30/2021   HCT 31.7 (L) 12/30/2021   MCV 82.6 12/30/2021   PLT 502 (H) 12/30/2021

## 2022-01-27 NOTE — Assessment & Plan Note (Signed)
DASH diet and commitment to daily physical activity for a minimum of 30 minutes discussed and encouraged, as a part of hypertension management. The importance of attaining a healthy weight is also discussed.  BP/Weight 01/27/2022 12/30/2021 12/23/2021 08/01/2021 02/24/2021 01/19/2021 12/25/2020  Systolic BP 130 128 - 144 155 035 137  Diastolic BP 72 84 - 89 96 95 83  Wt. (Lbs) 263 - 284.61 279 280 284 284  BMI 42.45 - 45.94 44.36 44.52 45.84 45.15  Continue lisinopril 20 mg daily.

## 2022-01-28 ENCOUNTER — Other Ambulatory Visit: Payer: Self-pay | Admitting: Nurse Practitioner

## 2022-01-28 DIAGNOSIS — E559 Vitamin D deficiency, unspecified: Secondary | ICD-10-CM

## 2022-01-28 LAB — CMP14+EGFR
ALT: 11 IU/L (ref 0–32)
AST: 14 IU/L (ref 0–40)
Albumin/Globulin Ratio: 1.5 (ref 1.2–2.2)
Albumin: 4.4 g/dL (ref 3.8–4.8)
Alkaline Phosphatase: 61 IU/L (ref 44–121)
BUN/Creatinine Ratio: 22 (ref 9–23)
BUN: 15 mg/dL (ref 6–24)
Bilirubin Total: 0.5 mg/dL (ref 0.0–1.2)
CO2: 17 mmol/L — ABNORMAL LOW (ref 20–29)
Calcium: 9.3 mg/dL (ref 8.7–10.2)
Chloride: 108 mmol/L — ABNORMAL HIGH (ref 96–106)
Creatinine, Ser: 0.67 mg/dL (ref 0.57–1.00)
Globulin, Total: 2.9 g/dL (ref 1.5–4.5)
Glucose: 88 mg/dL (ref 70–99)
Potassium: 4.6 mmol/L (ref 3.5–5.2)
Sodium: 138 mmol/L (ref 134–144)
Total Protein: 7.3 g/dL (ref 6.0–8.5)
eGFR: 107 mL/min/{1.73_m2} (ref >59)

## 2022-01-28 LAB — CBC WITH DIFFERENTIAL/PLATELET
Basophils Absolute: 0 10*3/uL (ref 0.0–0.2)
Basos: 0 %
EOS (ABSOLUTE): 0 10*3/uL (ref 0.0–0.4)
Eos: 1 %
Hematocrit: 33.8 % — ABNORMAL LOW (ref 34.0–46.6)
Hemoglobin: 11 g/dL — ABNORMAL LOW (ref 11.1–15.9)
Immature Grans (Abs): 0 10*3/uL (ref 0.0–0.1)
Immature Granulocytes: 0 %
Lymphocytes Absolute: 2.1 10*3/uL (ref 0.7–3.1)
Lymphs: 35 %
MCH: 26.8 pg (ref 26.6–33.0)
MCHC: 32.5 g/dL (ref 31.5–35.7)
MCV: 82 fL (ref 79–97)
Monocytes Absolute: 0.4 10*3/uL (ref 0.1–0.9)
Monocytes: 7 %
Neutrophils Absolute: 3.3 10*3/uL (ref 1.4–7.0)
Neutrophils: 57 %
Platelets: 340 10*3/uL (ref 150–450)
RBC: 4.11 x10E6/uL (ref 3.77–5.28)
RDW: 16.8 % — ABNORMAL HIGH (ref 11.7–15.4)
WBC: 5.8 10*3/uL (ref 3.4–10.8)

## 2022-01-28 LAB — VITAMIN D 25 HYDROXY (VIT D DEFICIENCY, FRACTURES): Vit D, 25-Hydroxy: 18.7 ng/mL — ABNORMAL LOW (ref 30.0–100.0)

## 2022-01-28 MED ORDER — VITAMIN D3 25 MCG (1000 UT) PO CAPS: 1000.0000 [IU] | ORAL_CAPSULE | Freq: Every day | ORAL | 3 refills | Status: DC

## 2022-01-28 NOTE — Progress Notes (Signed)
Please follow up with labs on her lipid panel and TSH, these labs should have been done yesterday.  Thanks.

## 2022-01-28 NOTE — Progress Notes (Signed)
Vitamin D level is low patient should take vitamin D 1000 units daily.  Patient should continue to take her iron supplements for anemia.  Her Blood counts is better than it was 4 weeks ago.  Thanks.

## 2022-01-29 LAB — TSH+FREE T4
Free T4: 0.84 ng/dL (ref 0.82–1.77)
TSH: 0.9 u[IU]/mL (ref 0.450–4.500)

## 2022-01-29 LAB — LIPID PANEL
Chol/HDL Ratio: 2.9 ratio (ref 0.0–4.4)
Cholesterol, Total: 215 mg/dL — ABNORMAL HIGH (ref 100–199)
HDL: 73 mg/dL (ref >39)
LDL Chol Calc (NIH): 128 mg/dL — ABNORMAL HIGH (ref 0–99)
Triglycerides: 78 mg/dL (ref 0–149)
VLDL Cholesterol Cal: 14 mg/dL (ref 5–40)

## 2022-01-30 ENCOUNTER — Other Ambulatory Visit: Payer: Self-pay | Admitting: Nurse Practitioner

## 2022-01-30 DIAGNOSIS — E1169 Type 2 diabetes mellitus with other specified complication: Secondary | ICD-10-CM

## 2022-01-30 DIAGNOSIS — E785 Hyperlipidemia, unspecified: Secondary | ICD-10-CM

## 2022-01-30 MED ORDER — ATORVASTATIN CALCIUM 10 MG PO TABS: 10.0000 mg | ORAL_TABLET | Freq: Every day | ORAL | 3 refills | Status: DC

## 2022-01-30 NOTE — Progress Notes (Signed)
Cholesterol level is not at goal . start atorvastatin 10mg  daily.  Pls schedule a 6 week follow  up recheck labs.  I have ordered labs to be done 3 to 5 days before her next visit.  Patient should report muscle pain.

## 2022-01-31 ENCOUNTER — Encounter: Payer: Self-pay | Admitting: Internal Medicine

## 2022-02-02 ENCOUNTER — Ambulatory Visit: Payer: Medicare Other | Admitting: Neurology

## 2022-02-02 ENCOUNTER — Telehealth: Payer: Self-pay | Admitting: Neurology

## 2022-02-02 ENCOUNTER — Inpatient Hospital Stay: Payer: Medicare Other | Admitting: Internal Medicine

## 2022-02-02 ENCOUNTER — Encounter: Payer: Self-pay | Admitting: Neurology

## 2022-02-02 VITALS — BP 119/83 | HR 89 | Ht 66.0 in | Wt 258.5 lb

## 2022-02-02 DIAGNOSIS — G43009 Migraine without aura, not intractable, without status migrainosus: Secondary | ICD-10-CM

## 2022-02-02 DIAGNOSIS — G4733 Obstructive sleep apnea (adult) (pediatric): Secondary | ICD-10-CM | POA: Diagnosis not present

## 2022-02-02 DIAGNOSIS — G35 Multiple sclerosis: Secondary | ICD-10-CM | POA: Diagnosis not present

## 2022-02-02 DIAGNOSIS — E1165 Type 2 diabetes mellitus with hyperglycemia: Secondary | ICD-10-CM | POA: Diagnosis not present

## 2022-02-02 DIAGNOSIS — F3289 Other specified depressive episodes: Secondary | ICD-10-CM

## 2022-02-02 MED ORDER — CYCLOBENZAPRINE HCL 10 MG PO TABS: 10.0000 mg | ORAL_TABLET | Freq: Two times a day (BID) | ORAL | 3 refills | Status: AC | PRN

## 2022-02-02 MED ORDER — DULOXETINE HCL 60 MG PO CPEP: 60.0000 mg | ORAL_CAPSULE | Freq: Every day | ORAL | 3 refills | Status: DC

## 2022-02-02 NOTE — Telephone Encounter (Signed)
[employer] medicare order sent to [facility], [facility] they will reach out to the patient to scehdule.

## 2022-02-02 NOTE — Patient Instructions (Signed)
Restart the Cymbalta 60 mg daily for mood and achy pain  Let us know when you start CPAP Take Flexeril as needed for muscle spasms Check MRI of the brain  Check labs today See you back in 6 months Continue to work on good control of diabetes

## 2022-02-02 NOTE — Progress Notes (Signed)
PATIENT: Katrina Montes DOB: [birth year]  REASON FOR VISIT: Follow up for MS HISTORY FROM: Patient PRIMARY NEUROLOGIST: Dr. Terrace Arabia   HISTORY   Katrina Montes is 51 right-handed African-American female, accompanied by her mother, referred by her primary care physician Dr. Jeanice Lim for evaluation of right optic neuritis, abnormal MRI of the brain, suspicious for relapsing remitting multiple sclerosis, initial evaluation was on Jan 14 2015.   She had a past medical history of hypertension, diabetes, A1c was 11.9 in July 2015, most recent A1c was still high at 8.9, used to work as a Counselling psychologist job, which was out sourced recently,   In May of 2015, she had transient right eye blurry vision, was considered due to her high glucose, recovered within a few weeks   Since January 2016, she began to experience intermittent vertigo, dizziness, also had worsening right eye blurry vision, color washing out, could only see her rim at her right peripheral visual field.   Relapsing Remitting Multiple Sclerosis was diagnosed since January 2016: Based on her abnormal MRI of the brain, there was also cervical spinal cord involvement   MRI of the brain without contrast at [facility] imaging January 2016 showed multiple periventricular white matter disease, significant involvement of corpus callosum, perpendicular to ventricle, consistent with multiple sclerosis, now was also suspicious for right optic nerve edema, with hyperintensity signal at T2, FLAIR, no contrast enhancement, MRA of brain was normal.   MRI of the cervical spine with and without contrast in Jan 2016: showing hyperintense foci within the spinal cord posteriorly at C4 and posteriolaterally at C7-T1 consistent with multiple sclerosis plaques. There were no enhancing foci.   Spinal fluid testing January 13 2015, total protein was 708, RBC was 1600, RBC was 7,, cloudy reddish-looking, elevated IgG 17.7, IgG index 0.97, IgG synthetic rate 238, 0  oligoclonal bands,   Visual evoked potential showed prolongation on the right side.   She was treated with IV Solu-Medrol for 3 days in Jan 2016, which only mild improved her right vision, has significant worsening of her blood glucose level.She was treated with Achtar injection in Feb 2016, but still having significant residual visual difficulty.   JC virus was positive with titer of 1.1 3 (Jan 14 2015),  laboratory evaluation showed normal or negative CPK, HIV, protein electrophoresis, positive varicella-zoster virus antibody, mild elevated ESR, C-reactive protein, hepatitis panel, Lyme titer, showed anemia, hemoglobin 11.8, otherwise normal CBC, normal CMP, with exception of elevated glucose, normal TSH,   She was treated with Tysarbril since March 14th, 2016, was switched to ocrelizumab since February 2020   MRI of the brain with and without contrast in April 2019 showed multiple supratentorium lesions, no contrast enhancement, no significant change compared to previous MRI of the brain   Vision: History of right optic neuritis, with partial recovery, now complains of blurry vision, difficult to read anything without bleeders or magnified glasses, difficult to read through computer screen or watch TV, visual acuity, OS 20/40, OD 20/50-1   Cognitive impairment: has to take frequent notes, difficulty to concentrate, to express herself.   Gait abnormality: Treated easily, generalized weakness, also complicated by her low back pain, left sided radicular pain,   Spasticity: Bilateral lower extremity spasticity, is taking gabapentin 300 mg 3 times a day previously tried and failed baclofen   Fatigue: Severe fatigue, also complicated by her depression anxiety chronic insomnia, body achy pain, limit her daily activity more than 50%, she has heavy menstruation-like cycle, chronic  anemia, recent hemoglobin was 10.6.   Chronic migraine: She also has history of chronic migraine headaches, retro-orbital  area severe pounding headache with associated light noise sensitivity, and happened frequently, up to 3 to four times each week, Maxalt as needed was helpful,   Severe low back pain, radiating pain to left lower extremity,  MRI of lumbar in July 201 there is minimum anterolisthesis of L4 upon L5, due to severe facet hypertrophy, associated with left ligamentum flavum hypertrophy and disc protrusion, there is moderate  Left foraminal and lateral recess stenosis, potential for left L4-5 nerve root compression.   Diabetic peripheral neuropathy, Progressive worsening bilateral feet paresthesia, now taking insulin   Depression anxiety, chronic insomnia, polypharmacy treatment:  Trazodone 50 mg every night, gabapentin 300 mg 3 times a day, Topamax 100 mg twice a day, Flexeril as needed   Weakness in hands: EMG nerve conduction study from outside clinic confirmed the diagnosis of severe right median neuropathy,   Urinary frequency: stable Hearing Loss-she reports occasional ringing in her ears, notices her hearing is not as good as it used to be, having to read lips, this is embarrassing to her   Mild obstructive sleep apnea, using CPAP machine,   Vitamin D deficiency: Vitamin D level was 23, on supplement     Update 15 2022 She is tolerating ocrelizumab, today alone at visit, constellation of complaints, has been out of her insulin due to financial concerns, last A1c December 2021 was 12.3, continue complains of fatigue, depression, gait abnormality, frequent migraine headaches, mild improvement in her low back pain, there is no flareup of her MS symptoms, urinary urgency, mild gait abnormalities   We personally reviewed most recent MRI of the brain with without contrast in February 2021: Multiple T2/FLAIR hyperintensity in the hemisphere, left thalamus, brainstem, none of this is acute.  No contrast-enhancement     Update Feb 02, 2022 SS: Here today alone, we spent a lot of time discussing her  medications, hospitalized January 5 for 9 days related to hyperglycemia, gluteal abscess that needed surgical drainage, A1c was 12.4, ran out of insulin, had financial hardships, tearful recounting the hard time she has had. Just now starting to feel better, getting out.  Establish with new PCP last week, gabapentin was restarted her neuropathy pain, left hip pain.  No longer taking nortriptyline or Effexor.  Is not sure about Cymbalta?  Remains on Topamax, Maxalt, migraines doing well. MS overall stable, Ocrevus is due in late March 2023 at our office. Does not wearing off effect 1 month before next infusion.  On vitamin D OTC. CPAP has come in but hasn't started yet.  Reviewed recent labs 01/27/22, HGB 11, creatinine 0.67, TSH 0.900, vitamin D 18.7  REVIEW OF SYSTEMS: Out of a complete 14 system review of symptoms, the patient complains only of the following symptoms, and all other reviewed systems are negative.  See HPI  ALLERGIES: No Known Allergies  HOME MEDICATIONS: Outpatient Medications Prior to Visit  Medication Sig Dispense Refill   Accu-Chek Softclix Lancets lancets Use as directed. 100 each 5   APPLE CIDER VINEGAR PO Take 1 tablet by mouth daily.     atorvastatin (LIPITOR) 10 MG tablet Take 1 tablet (10 mg total) by mouth daily. 90 tablet 3   blood glucose meter kit and supplies Dispense based on patient and insurance preference. Use up to four times daily as directed. (FOR ICD-10 E10.9, E11.9). 1 each 0   Blood Glucose Monitoring Suppl (ACCU-CHEK GUIDE  ME) w/Device KIT Use as directed to check blood glucose 1 kit 0   Blood Glucose Monitoring Suppl (BLOOD GLUCOSE SYSTEM PAK) KIT Please dispense as Guide Me System. Use as directed to monitor FSBS 3x daily. Dx: E11.9. 100 each 3   Cholecalciferol (VITAMIN D3) 25 MCG (1000 UT) CAPS Take 1 capsule (1,000 Units total) by mouth daily. 30 capsule 3   clotrimazole (LOTRIMIN) 1 % cream Apply topically 2 (two) times daily. 28 g 0    DiphenhydrAMINE HCl (BENADRYL ALLERGY PO) Take 1 tablet by mouth as needed (allergy).     docusate sodium (COLACE) 50 MG capsule Take 1 capsule (50 mg total) by mouth 2 (two) times daily. 60 capsule 11   ELDERBERRY PO Take 1,150 mg by mouth daily.     ferrous sulfate (EQL SLOW RELEASE IRON) 160 (50 Fe) MG TBCR SR tablet Take 1 tablet (160 mg total) by mouth daily. 30 tablet 3   gabapentin (NEURONTIN) 100 MG capsule Take 1 capsule (100 mg total) by mouth 3 (three) times daily. 90 capsule 3   glucose blood (ACCU-CHEK GUIDE) test strip Use as instructed up to 6 times daily 200 each 12   Glucose Blood (BLOOD GLUCOSE TEST STRIPS) STRP Please dispense as Guide Me System. Use as directed to monitor FSBS 3x daily. Dx: E11.9. 100 each 3   ibuprofen (ADVIL,MOTRIN) 200 MG tablet Take 200 mg by mouth every 6 (six) hours as needed for headache, moderate pain or mild pain.     insulin degludec (TRESIBA) 100 UNIT/ML FlexTouch Pen Inject 22 Units into the skin daily. 9 mL 11   insulin lispro (HUMALOG) 100 UNIT/ML KwikPen Inject 6 Units into the skin 3 (three) times daily before meals. 6 mL 11   Insulin Pen Needle 32G X 4 MM MISC Use as directed 100 each 0   Lancets MISC Please dispense as Guide Me System. Use as directed to monitor FSBS 3x daily. Dx: E11.9. 100 each 3   lisinopril (ZESTRIL) 20 MG tablet Take 1 tablet (20 mg total) by mouth daily. 30 tablet 11   meclizine (ANTIVERT) 25 MG tablet Take 1 tablet (25 mg total) by mouth 3 (three) times daily as needed for dizziness. 30 tablet 6   metFORMIN (GLUCOPHAGE) 500 MG tablet Take 1 tablet (500 mg total) by mouth 2 (two) times daily with a meal. 60 tablet 11   Multiple Vitamin (MULTIVITAMIN) capsule Take 1 capsule by mouth daily.     norgestimate-ethinyl estradiol (ESTARYLLA) 0.25-35 MG-MCG tablet Take 1 tablet by mouth daily. 84 tablet 3   ocrelizumab (OCREVUS) 300 MG/10ML injection Inject 300 mg into the vein every 6 (six) months.     oxyCODONE (OXY  IR/ROXICODONE) 5 MG immediate release tablet Take 1 tablet (5 mg total) by mouth every 6 (six) hours as needed for moderate pain, severe pain or breakthrough pain. 15 tablet 0   polyethylene glycol powder (GLYCOLAX/MIRALAX) 17 GM/SCOOP powder Take 1 capful (17 g) in water by mouth daily. 238 g 0   PRESCRIPTION MEDICATION Place 1 drop into both eyes as needed (unk).     rizatriptan (MAXALT) 10 MG tablet Take 1 tablet (10 mg total) by mouth as needed for migraine. May repeat in 2 hours if needed. Max dose: 2/24 hr or 15/30 days. 45 tablet 3   topiramate (TOPAMAX) 100 MG tablet Take 1 tablet (100 mg total) by mouth at bedtime. 90 tablet 4   cyclobenzaprine (FLEXERIL) 10 MG tablet Take 10 mg by mouth 4 (  four) times daily as needed for muscle spasms.  3   DULoxetine (CYMBALTA) 60 MG capsule Take 1 capsule (60 mg total) by mouth daily. 90 capsule 3   nortriptyline (PAMELOR) 25 MG capsule Take 2 capsules (50 mg total) by mouth at bedtime. 180 capsule 4   venlafaxine XR (EFFEXOR-XR) 75 MG 24 hr capsule Take 75 mg by mouth daily.     No facility-administered medications prior to visit.    PAST MEDICAL HISTORY: Past Medical History:  Diagnosis Date   Anemia    Anxiety    Back pain    Carpal tunnel syndrome    Constipation    Depression    Diabetes mellitus    Hypertension    Joint pain    Lactose intolerance    Multiple sclerosis (HCC)    Optic neuritis    Shortness of breath    Swallowing difficulty    Swelling of both lower extremities    Vision, loss, sudden    Vitamin D deficiency     PAST SURGICAL HISTORY: Past Surgical History:  Procedure Laterality Date   BREAST REDUCTION SURGERY     CYST EXCISION     INCISION AND DRAINAGE ABSCESS Left 12/23/2021   Procedure: INCISION AND DRAINAGE BUTTOCK ABSCESS;  Surgeon: Almond Lint, MD;  Location: [facility] OR;  Service: General;  Laterality: Left;   ovarian cyst removed     TUBAL LIGATION     WISDOM TOOTH EXTRACTION      FAMILY  HISTORY: Family History  Problem Relation Age of Onset   Cancer Mother    Diabetes Mother    Hearing loss Mother    Thyroid nodules Mother    Anemia Mother    Kidney disease Mother    Hypertension Mother    Obesity Mother    Breast cancer Mother    Hyperlipidemia Father    High blood pressure Father    Sleep apnea Father    Pancreatic cancer Father    Anemia Sister    Anemia Sister    Pancreatic cancer Maternal Uncle    Kidney disease Paternal Uncle    Arthritis Maternal Grandmother    Arthritis Maternal Grandfather    Arthritis Paternal Grandmother    Kidney disease Paternal Grandmother    Hypertension Paternal Grandmother    Arthritis Paternal Grandfather    Vision loss Paternal Grandfather    Stroke Paternal Grandfather    Hyperlipidemia Paternal Grandfather    Colon cancer Neg Hx     SOCIAL HISTORY: Social History   Socioeconomic History   Marital status: Single    Spouse name: Not on file   Number of children: 0   Years of education: College   Highest education level: Not on file  Occupational History   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   Right handed.   Uses caffeine sparingly.   Social Determinants of Health   Financial Resource Strain: Not on file  Food Insecurity: Not on file  Transportation Needs: Not on file  Physical Activity: Not on file  Stress: Not on file  Social Connections: Not on file  Intimate Partner Violence: Not on file   PHYSICAL EXAM  Vitals:  02/02/22 0750  BP: 119/83  Pulse: 89  Weight: 258 lb 8 oz (117.3 kg)  Height: $Remove'5\' 6"'uGigYqc$  (1.676 m)   Body mass index is 41.72 kg/m.  Generalized: Well developed, in no acute distress  Neurological examination  Mentation: Alert  oriented to time, place, history taking. Follows all commands speech and language fluent Cranial nerve II-XII: Pupils were equal round reactive to light. Extraocular movements were full, visual field were full on confrontational test. Facial sensation and strength were normal. Head turning and shoulder shrug  were normal and symmetric. Motor: Good strength all extremities, exception 4/5 left hip flexion Sensory: Sensory testing is intact to soft touch on all 4 extremities. No evidence of extinction is noted.  Coordination: Cerebellar testing reveals good finger-nose-finger and heel-to-shin bilaterally.  Gait and station: Has to push off from seated position to stand, gait is slightly wide-based, antalgic on the left Reflexes: Deep tendon reflexes are symmetric but decreased  DIAGNOSTIC DATA (LABS, IMAGING, TESTING) - I reviewed patient records, labs, notes, testing and imaging myself where available.  Lab Results  Component Value Date   WBC 5.8 01/27/2022   HGB 11.0 (L) 01/27/2022   HCT 33.8 (L) 01/27/2022   MCV 82 01/27/2022   PLT 340 01/27/2022      Component Value Date/Time   NA 138 01/27/2022 1427   K 4.6 01/27/2022 1427   CL 108 (H) 01/27/2022 1427   CO2 17 (L) 01/27/2022 1427   GLUCOSE 88 01/27/2022 1427   GLUCOSE 127 (H) 12/26/2021 0212   BUN 15 01/27/2022 1427   CREATININE 0.67 01/27/2022 1427   CREATININE 0.67 11/19/2020 0931   CALCIUM 9.3 01/27/2022 1427   PROT 7.3 01/27/2022 1427   ALBUMIN 4.4 01/27/2022 1427   AST 14 01/27/2022 1427   ALT 11 01/27/2022 1427   ALKPHOS 61 01/27/2022 1427   BILITOT 0.5 01/27/2022 1427   GFRNONAA >60 12/26/2021 0212   GFRAA 120 07/19/2020 0813   Lab Results  Component Value Date   CHOL 215 (H) 01/27/2022   HDL 73 01/27/2022   LDLCALC 128 (H) 01/27/2022   TRIG 78 01/27/2022   CHOLHDL 2.9 01/27/2022   Lab Results  Component Value Date   HGBA1C 12.4 (H) 12/23/2021   No results found for: [id number] Lab Results  Component  Value Date   TSH 0.900 01/27/2022   ASSESSMENT AND PLAN 51 y.o. year old female   1.  Relapsing remitting multiple sclerosis -Stable, but recent very poorly controlled DM, recovering from recent hospitalization for hyperglycemia, surgical excision of gluteal abscess -Continue Ocrevus, next infusion scheduled for March 2023 -Check IgG, IgA, IgM -Check MRI of the brain with and without contrast for MS surveillance -reviewed recent CBC, CMP; Creatinine was 0.67 01/27/22 -Take Flexeril 10 mg PRN 2 times daily for MS muscle spasms, also on gabapentin for neuropathy  2.  Vitamin D deficiency -seeing PCP for this recent level was 18.7, on supplement  3.  Diabetes, poorly controlled -Establish with new PCP, working on this, on insulin, recent A1c was 12.4  4.  Depression -Restart Cymbalta 60 mg daily, for the mood, also neuropathy/aching pains -Have not restarted nortriptyline or Effexor, unclear benefit  5.  Migraine headaches -Under good control, continue Topamax, Maxalt as needed  6.  OSA -Reportedly ready, but has not picked up her CPAP machine, will let us know when she starts using so we can see her for follow-up within 30-90 days  Butler Denmark, AGNP-C, DNP 02/02/2022, 8:38 AM [facility], [place], [place] [address] [phone]

## 2022-02-03 LAB — IGG, IGA, IGM
IgA/Immunoglobulin A, Serum: 273 mg/dL (ref 87–352)
IgG (Immunoglobin G), Serum: 1624 mg/dL — ABNORMAL HIGH (ref 586–1602)
IgM (Immunoglobulin M), Srm: 18 mg/dL — ABNORMAL LOW (ref 26–217)

## 2022-02-13 ENCOUNTER — Encounter: Payer: Self-pay | Admitting: "Endocrinology

## 2022-02-13 ENCOUNTER — Other Ambulatory Visit: Payer: Self-pay

## 2022-02-13 ENCOUNTER — Ambulatory Visit: Payer: Medicare Other | Admitting: Nurse Practitioner

## 2022-02-13 ENCOUNTER — Ambulatory Visit: Payer: Medicare Other | Admitting: "Endocrinology

## 2022-02-13 VITALS — BP 113/79 | HR 90 | Ht 66.0 in | Wt 250.6 lb

## 2022-02-13 DIAGNOSIS — E1165 Type 2 diabetes mellitus with hyperglycemia: Secondary | ICD-10-CM

## 2022-02-13 DIAGNOSIS — E782 Mixed hyperlipidemia: Secondary | ICD-10-CM | POA: Insufficient documentation

## 2022-02-13 DIAGNOSIS — I10 Essential (primary) hypertension: Secondary | ICD-10-CM | POA: Diagnosis not present

## 2022-02-13 MED ORDER — INSULIN DEGLUDEC 100 UNIT/ML ~~LOC~~ SOPN: 20.0000 [IU] | PEN_INJECTOR | Freq: Every day | SUBCUTANEOUS | 1 refills | Status: DC

## 2022-02-13 NOTE — Progress Notes (Signed)
Endocrinology Consult Note       02/13/2022, 2:22 PM   Subjective:    Patient ID: Katrina Montes, female    DOB: 04-17-1971.  Katrina Montes is being seen in consultation for management of currently uncontrolled symptomatic diabetes requested by  Renee Rival, FNP.   Past Medical History:  Diagnosis Date   Anemia    Anxiety    Back pain    Carpal tunnel syndrome    Constipation    Depression    Diabetes mellitus    Diabetes mellitus, type II (HCC)    Hyperlipidemia    Hypertension    Joint pain    Lactose intolerance    Multiple sclerosis (HCC)    Optic neuritis    Shortness of breath    Swallowing difficulty    Swelling of both lower extremities    Vision, loss, sudden    Vitamin D deficiency     Past Surgical History:  Procedure Laterality Date   BREAST REDUCTION SURGERY     CYST EXCISION     INCISION AND DRAINAGE ABSCESS Left 12/23/2021   Procedure: INCISION AND DRAINAGE BUTTOCK ABSCESS;  Surgeon: Stark Klein, MD;  Location: [place];  Service: General;  Laterality: Left;   ovarian cyst removed     TUBAL LIGATION     WISDOM TOOTH EXTRACTION      Social History   Socioeconomic History   Marital status: Single    Spouse name: Not on file   Number of children: 0   Years of education: College   Highest education level: Not on file  Occupational History   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   Right handed.   Uses caffeine sparingly.   Social Determinants of Health   Financial Resource Strain: Not on file  Food Insecurity: Not on file  Transportation Needs: Not on file   Physical Activity: Not on file  Stress: Not on file  Social Connections: Not on file    Family History  Problem Relation Age of Onset   Cancer Mother    Diabetes Mother    Hearing loss Mother    Thyroid nodules Mother    Anemia Mother    Kidney disease Mother    Hypertension Mother    Obesity Mother    Breast cancer Mother    Hyperlipidemia Father    High blood pressure Father    Sleep apnea Father    Pancreatic cancer Father    Anemia Sister    Anemia Sister    Pancreatic cancer Maternal Uncle    Kidney disease Paternal Uncle    Arthritis Maternal Grandmother    Arthritis Maternal Grandfather    Arthritis Paternal Grandmother    Kidney disease Paternal Grandmother    Hypertension Paternal  Grandmother    Arthritis Paternal Grandfather    Vision loss Paternal Grandfather    Stroke Paternal Grandfather    Hyperlipidemia Paternal Grandfather    Colon cancer Neg Hx     Outpatient Encounter Medications as of 02/13/2022  Medication Sig   Accu-Chek Softclix Lancets lancets Use as directed.   APPLE CIDER VINEGAR PO Take 1 tablet by mouth daily.   atorvastatin (LIPITOR) 10 MG tablet Take 1 tablet (10 mg total) by mouth daily.   blood glucose meter kit and supplies Dispense based on patient and insurance preference. Use up to four times daily as directed. (FOR ICD-10 E10.9, E11.9).   Blood Glucose Monitoring Suppl (ACCU-CHEK GUIDE ME) w/Device KIT Use as directed to check blood glucose   Blood Glucose Monitoring Suppl (BLOOD GLUCOSE SYSTEM PAK) KIT Please dispense as Guide Me System. Use as directed to monitor FSBS 3x daily. Dx: E11.9.   Cholecalciferol (VITAMIN D3) 25 MCG (1000 UT) CAPS Take 1 capsule (1,000 Units total) by mouth daily.   clotrimazole (LOTRIMIN) 1 % cream Apply topically 2 (two) times daily.   cyclobenzaprine (FLEXERIL) 10 MG tablet Take 1 tablet (10 mg total) by mouth 2 (two) times daily as needed for muscle spasms.   DiphenhydrAMINE HCl (BENADRYL ALLERGY PO)  Take 1 tablet by mouth as needed (allergy).   docusate sodium (COLACE) 50 MG capsule Take 1 capsule (50 mg total) by mouth 2 (two) times daily.   DULoxetine (CYMBALTA) 60 MG capsule Take 1 capsule (60 mg total) by mouth daily.   ELDERBERRY PO Take 1,150 mg by mouth daily.   ferrous sulfate (EQL SLOW RELEASE IRON) 160 (50 Fe) MG TBCR SR tablet Take 1 tablet (160 mg total) by mouth daily.   gabapentin (NEURONTIN) 100 MG capsule Take 1 capsule (100 mg total) by mouth 3 (three) times daily.   glucose blood (ACCU-CHEK GUIDE) test strip Use as instructed up to 6 times daily   Glucose Blood (BLOOD GLUCOSE TEST STRIPS) STRP Please dispense as Guide Me System. Use as directed to monitor FSBS 3x daily. Dx: E11.9.   ibuprofen (ADVIL,MOTRIN) 200 MG tablet Take 200 mg by mouth every 6 (six) hours as needed for headache, moderate pain or mild pain.   insulin degludec (TRESIBA) 100 UNIT/ML FlexTouch Pen Inject 20 Units into the skin at bedtime.   Insulin Pen Needle 32G X 4 MM MISC Use as directed   Lancets MISC Please dispense as Guide Me System. Use as directed to monitor FSBS 3x daily. Dx: E11.9.   lisinopril (ZESTRIL) 20 MG tablet Take 1 tablet (20 mg total) by mouth daily.   meclizine (ANTIVERT) 25 MG tablet Take 1 tablet (25 mg total) by mouth 3 (three) times daily as needed for dizziness.   metFORMIN (GLUCOPHAGE) 500 MG tablet Take 1 tablet (500 mg total) by mouth 2 (two) times daily with a meal.   Multiple Vitamin (MULTIVITAMIN) capsule Take 1 capsule by mouth daily.   norgestimate-ethinyl estradiol (ESTARYLLA) 0.25-35 MG-MCG tablet Take 1 tablet by mouth daily.   ocrelizumab (OCREVUS) 300 MG/10ML injection Inject 300 mg into the vein every 6 (six) months.   oxyCODONE (OXY IR/ROXICODONE) 5 MG immediate release tablet Take 1 tablet (5 mg total) by mouth every 6 (six) hours as needed for moderate pain, severe pain or breakthrough pain.   polyethylene glycol powder (GLYCOLAX/MIRALAX) 17 GM/SCOOP powder Take  1 capful (17 g) in water by mouth daily.   PRESCRIPTION MEDICATION Place 1 drop into both eyes as needed (  unk).   rizatriptan (MAXALT) 10 MG tablet Take 1 tablet (10 mg total) by mouth as needed for migraine. May repeat in 2 hours if needed. Max dose: 2/24 hr or 15/30 days.   topiramate (TOPAMAX) 100 MG tablet Take 1 tablet (100 mg total) by mouth at bedtime.   [DISCONTINUED] insulin degludec (TRESIBA) 100 UNIT/ML FlexTouch Pen Inject 22 Units into the skin daily.   [DISCONTINUED] insulin lispro (HUMALOG) 100 UNIT/ML KwikPen Inject 6 Units into the skin 3 (three) times daily before meals.   No facility-administered encounter medications on file as of 02/13/2022.    ALLERGIES: No Known Allergies  VACCINATION STATUS: Immunization History  Administered Date(s) Administered   Influenza Whole 10/24/2010   Influenza,inj,Quad PF,6+ Mos 09/01/2014, 03/14/2019, 09/17/2019, 12/02/2020, 01/27/2022   Moderna SARS-COV2 Booster Vaccination 11/16/2020   Moderna Sars-Covid-2 Vaccination 02/09/2020, 03/09/2020   Pneumococcal Polysaccharide-23 08/04/2014   Tdap 09/17/2019    Diabetes She presents for her initial diabetic visit. She has type 2 diabetes mellitus. Onset time: She was diagnosed at approximate age of 24 years. Her disease course has been worsening. There are no hypoglycemic associated symptoms. Pertinent negatives for hypoglycemia include no confusion, headaches, pallor or seizures. Associated symptoms include fatigue, polydipsia and polyuria. Pertinent negatives for diabetes include no chest pain and no polyphagia. There are no hypoglycemic complications. Symptoms are worsening (More recently she has shown near target glycemic profile on insulin treatment.  She presents with a meter showing average blood glucose of 123 mg per DL). There are no diabetic complications. Risk factors for coronary artery disease include diabetes mellitus, dyslipidemia, hypertension, obesity and sedentary lifestyle.  Current diabetic treatment includes insulin injections. Her weight is decreasing steadily. She is following a generally unhealthy diet. When asked about meal planning, she reported none. She has not had a previous visit with a dietitian. She rarely participates in exercise. Her home blood glucose trend is decreasing steadily. Her breakfast blood glucose range is generally 110-130 mg/dl. Her overall blood glucose range is 110-130 mg/dl. (She presents with a meter showing average blood glucose of 123.  No hypoglycemia.  Her most recent A1c was 12.4%.) An ACE inhibitor/angiotensin II receptor blocker is being taken.  Hypertension This is a chronic problem. The current episode started more than 1 year ago. Pertinent negatives include no chest pain, headaches, palpitations or shortness of breath. Risk factors for coronary artery disease include dyslipidemia, diabetes mellitus, obesity, sedentary lifestyle and smoking/tobacco exposure. Past treatments include ACE inhibitors.  Hyperlipidemia This is a chronic problem. The current episode started more than 1 year ago. Exacerbating diseases include diabetes and obesity. Pertinent negatives include no chest pain, myalgias or shortness of breath. Risk factors for coronary artery disease include diabetes mellitus, dyslipidemia, family history, hypertension, obesity and a sedentary lifestyle.    Review of Systems  Constitutional:  Positive for fatigue. Negative for chills, fever and unexpected weight change.  HENT:  Negative for trouble swallowing and voice change.   Eyes:  Negative for visual disturbance.  Respiratory:  Negative for cough, shortness of breath and wheezing.   Cardiovascular:  Negative for chest pain, palpitations and leg swelling.  Gastrointestinal:  Negative for diarrhea, nausea and vomiting.  Endocrine: Positive for polydipsia and polyuria. Negative for cold intolerance, heat intolerance and polyphagia.  Musculoskeletal:  Negative for  arthralgias and myalgias.  Skin:  Negative for color change, pallor, rash and wound.  Neurological:  Negative for seizures and headaches.  Psychiatric/Behavioral:  Negative for confusion and suicidal ideas.  Objective:    Vitals with BMI 02/13/2022 02/02/2022 01/27/2022  Height $Remov'5\' 6"'ABWoFm$  $Remove'5\' 6"'AVcGWFF$  $RemoveB'5\' 6"'OzIznznO$   Weight 250 lbs 10 oz 258 lbs 8 oz 263 lbs  BMI 40.47 78.58 85.02  Systolic 774 128 786  Diastolic 79 83 72  Pulse 90 89 83    BP 113/79    Pulse 90    Ht $R'5\' 6"'NS$  (1.676 m)    Wt 250 lb 9.6 oz (113.7 kg)    BMI 40.45 kg/m   Wt Readings from Last 3 Encounters:  02/13/22 250 lb 9.6 oz (113.7 kg)  02/02/22 258 lb 8 oz (117.3 kg)  01/27/22 263 lb (119.3 kg)     Physical Exam Constitutional:      Appearance: She is well-developed.  HENT:     Head: Normocephalic and atraumatic.  Neck:     Thyroid: No thyromegaly.     Trachea: No tracheal deviation.  Cardiovascular:     Rate and Rhythm: Normal rate and regular rhythm.  Pulmonary:     Effort: Pulmonary effort is normal.  Abdominal:     Tenderness: There is no abdominal tenderness. There is no guarding.  Musculoskeletal:        General: Normal range of motion.     Cervical back: Normal range of motion and neck supple.  Skin:    General: Skin is warm and dry.     Coloration: Skin is not pale.     Findings: No erythema or rash.  Neurological:     Mental Status: She is alert and oriented to person, place, and time.     Cranial Nerves: No cranial nerve deficit.     Coordination: Coordination normal.     Deep Tendon Reflexes: Reflexes are normal and symmetric.  Psychiatric:        Judgment: Judgment normal.      CMP ( most recent) CMP     Component Value Date/Time   NA 138 01/27/2022 1427   K 4.6 01/27/2022 1427   CL 108 (H) 01/27/2022 1427   CO2 17 (L) 01/27/2022 1427   GLUCOSE 88 01/27/2022 1427   GLUCOSE 127 (H) 12/26/2021 0212   BUN 15 01/27/2022 1427   CREATININE 0.67 01/27/2022 1427   CREATININE 0.67 11/19/2020 0931    CALCIUM 9.3 01/27/2022 1427   PROT 7.3 01/27/2022 1427   ALBUMIN 4.4 01/27/2022 1427   AST 14 01/27/2022 1427   ALT 11 01/27/2022 1427   ALKPHOS 61 01/27/2022 1427   BILITOT 0.5 01/27/2022 1427   GFRNONAA >60 12/26/2021 0212   GFRAA 120 07/19/2020 0813     Diabetic Labs (most recent): Lab Results  Component Value Date   HGBA1C 12.4 (H) 12/23/2021   HGBA1C 12.3 (H) 11/19/2020   HGBA1C 10.8 (H) 01/05/2020     Lipid Panel ( most recent) Lipid Panel     Component Value Date/Time   CHOL 215 (H) 01/27/2022 1426   TRIG 78 01/27/2022 1426   HDL 73 01/27/2022 1426   CHOLHDL 2.9 01/27/2022 1426   CHOLHDL 2.5 11/19/2020 0931   VLDL 23 11/02/2014 0944   LDLCALC 128 (H) 01/27/2022 1426   LDLCALC 87 11/19/2020 0931   LABVLDL 14 01/27/2022 1426      Lab Results  Component Value Date   TSH 0.900 01/27/2022   TSH 1.050 10/21/2018   TSH 1.250 02/26/2018   TSH 1.281 07/28/2014   TSH 1.217 01/10/2011   TSH 1.644 09/22/2010   FREET4 0.84 01/27/2022  Assessment & Plan:   1. Poorly controlled type 2 diabetes mellitus ([place])  - Mauriana Leonor Darnell has currently uncontrolled symptomatic type 2 DM since  51 years of age,  with most recent A1c of  12.4 %. Recent labs reviewed. - I had a long discussion with her about the progressive nature of diabetes and the pathology behind its complications. -her diabetes is complicated by obesity/sedentary life and she remains at a high risk for more acute and chronic complications which include CAD, CVA, CKD, retinopathy, and neuropathy. These are all discussed in detail with her.  - I discussed all available options of managing her diabetes including de-escalation of medications. I have counseled her on diet  and weight management  by adopting a Whole Food , Plant Predominant  ( WFPP) nutrition as recommended by [employer] of Lifestyle Medicine. Patient is encouraged to switch to  unprocessed or minimally processed  complex starch,  adequate protein intake (mainly plant source), minimal liquid fat ( mainly vegetable oils), plenty of fruits, and vegetables. -  she is advised to stick to a routine mealtimes to eat 3 complete meals a day and snack only when necessary ( to snack only to correct hypoglycemia BG <70 day time or <100 at night).   - she acknowledges that there is a room for improvement in her food and drink choices. - Further Specific Suggestion is made for her to avoid simple carbohydrates  from her diet including Cakes, Sweet Desserts, Ice Cream, Soda (diet and regular), Sweet Tea, Candies, Chips, Cookies, Store Bought Juices, Alcohol ,  Artificial Sweeteners,  Coffee Creamer, and "Sugar-free" Products. This will help patient to have more stable blood glucose profile and potentially avoid unintended weight gain.  The following Lifestyle Medicine recommendations according to [facility]) were discussed and offered to patient and she agrees to start the journey:  A. Whole Foods, Plant-based plate comprising of fruits and vegetables, plant-based proteins, whole-grain carbohydrates was discussed in detail with the patient.   A list for source of those nutrients were also provided to the patient.  Patient will use only water or unsweetened tea for hydration. B.  The need to stay away from risky substances including alcohol, smoking; obtaining 7 to 9 hours of restorative sleep, at least 150 minutes of moderate intensity exercise weekly, the importance of healthy social connections,  and stress reduction techniques were discussed. C.  A full color page of  Calorie density of various food groups per pound showing examples of each food groups was provided to the patient.  - she will be scheduled with Jearld Fenton, RDN, CDE for individualized diabetes education.  - I have approached her with the following plan to manage  her diabetes and patient agrees:   --In light of her presentation with  tighter glycemic profile recently, she would benefit from de-escalation in her insulin treatment.  I advised her to stop Humalog for now.  She is advised to adjust her Tyler Aas to 20 units nightly, associated with monitoring of blood glucose at least twice a day-daily before breakfast and at bedtime and return in 9 weeks for reevaluation.  He is advised to continue metformin 1000 mg p.o. twice daily.    - she is encouraged to call clinic for blood glucose levels less than 70 or above 200 mg /dl. - she will be considered for incretin therapy as appropriate next visit.  - Specific targets for  A1c;  LDL, HDL,  and Triglycerides were  discussed with the patient.  2) Blood Pressure /Hypertension:  her blood pressure is  controlled to target.   she is advised to continue her current medications including lisinopril 10 mg p.o. daily with breakfast . 3) Lipids/Hyperlipidemia:   Review of her recent lipid panel showed  uncontrolled  LDL at 128 .  she  is advised to continue atorvastatin 10 mg daily at bedtime.  She will benefit from WF PB diet described above.  Side effects and precautions discussed with her.  4)  Weight/Diet:  Body mass index is 40.45 kg/m.  -   clearly complicating her diabetes care.   she is  a candidate for weight loss. I discussed with her the fact that loss of 5 - 10% of her  current body weight will have the most impact on her diabetes management.  The above detailed  ACLM recommendations for nutrition, exercise, sleep, social life, avoidance of risky substances, the need for restorative sleep   information will also detailed on discharge instructions.  5) Chronic Care/Health Maintenance:  -she  is on ACEI/ARB and Statin medications and  is encouraged to initiate and continue to follow up with Ophthalmology, Dentist,  Podiatrist at least yearly or according to recommendations, and advised to   stay away from smoking. I have recommended yearly flu vaccine and pneumonia vaccine at least  every 5 years; moderate intensity exercise for up to 150 minutes weekly; and  sleep for 7- 9 hours a day.  - she is  advised to maintain close follow up with Renee Rival, FNP for primary care needs, as well as her other providers for optimal and coordinated care.   I spent 68 minutes in the care of the patient today including review of labs from [facility], Lipids, Thyroid Function, Hematology (current and previous including abstractions from other facilities); face-to-face time discussing  her blood glucose readings/logs, discussing hypoglycemia and hyperglycemia episodes and symptoms, medications doses, her options of short and long term treatment based on the latest standards of care / guidelines;  discussion about incorporating lifestyle medicine;  and documenting the encounter.     Please refer to Patient Instructions for Blood Glucose Monitoring and Insulin/Medications Dosing Guide"  in media tab for additional information. Please  also refer to " Patient Self Inventory" in the Media  tab for reviewed elements of pertinent patient history.  Masiyah Raiford Noble participated in the discussions, expressed understanding, and voiced agreement with the above plans.  All questions were answered to her satisfaction. she is encouraged to contact clinic should she have any questions or concerns prior to her return visit.   Follow up plan: - Return in about 9 weeks (around 04/17/2022) for Bring Meter and Logs- A1c in Office.  Glade Lloyd, MD [facility] [address] [place], [place] [address] Phone: [phone]  Fax: [phone]    02/13/2022, 2:22 PM  This note was partially dictated with voice recognition software. Similar sounding words can be transcribed inadequately or may not  be corrected upon review.

## 2022-02-13 NOTE — Patient Instructions (Signed)

## 2022-02-14 ENCOUNTER — Ambulatory Visit: Payer: Medicare Other

## 2022-02-14 LAB — CMP14+EGFR
ALT: 12 IU/L (ref 0–32)
AST: 10 IU/L (ref 0–40)
Albumin/Globulin Ratio: 1.6 (ref 1.2–2.2)
Albumin: 4.6 g/dL (ref 3.8–4.8)
Alkaline Phosphatase: 71 IU/L (ref 44–121)
BUN/Creatinine Ratio: 19 (ref 9–23)
BUN: 13 mg/dL (ref 6–24)
Bilirubin Total: 0.3 mg/dL (ref 0.0–1.2)
CO2: 20 mmol/L (ref 20–29)
Calcium: 9.7 mg/dL (ref 8.7–10.2)
Chloride: 104 mmol/L (ref 96–106)
Creatinine, Ser: 0.69 mg/dL (ref 0.57–1.00)
Globulin, Total: 2.9 g/dL (ref 1.5–4.5)
Glucose: 120 mg/dL — ABNORMAL HIGH (ref 70–99)
Potassium: 4.3 mmol/L (ref 3.5–5.2)
Sodium: 140 mmol/L (ref 134–144)
Total Protein: 7.5 g/dL (ref 6.0–8.5)
eGFR: 106 mL/min/{1.73_m2} (ref >59)

## 2022-02-14 LAB — HM DIABETES EYE EXAM

## 2022-02-14 LAB — LIPID PANEL
Chol/HDL Ratio: 2.5 ratio (ref 0.0–4.4)
Cholesterol, Total: 169 mg/dL (ref 100–199)
HDL: 67 mg/dL (ref >39)
LDL Chol Calc (NIH): 89 mg/dL (ref 0–99)
Triglycerides: 69 mg/dL (ref 0–149)
VLDL Cholesterol Cal: 13 mg/dL (ref 5–40)

## 2022-02-14 NOTE — Progress Notes (Signed)
Please review results with patient, labs are stable Patient should continue current medications and follow-up as planned. Thank you.

## 2022-02-15 ENCOUNTER — Ambulatory Visit
Admission: RE | Admit: 2022-02-15 | Discharge: 2022-02-15 | Disposition: A | Discharge status: Home / Self Care | Payer: Medicare Other | Source: Ambulatory Visit | Attending: Neurology | Admitting: Neurology

## 2022-02-15 ENCOUNTER — Other Ambulatory Visit: Payer: Self-pay

## 2022-02-15 DIAGNOSIS — Z1231 Encounter for screening mammogram for malignant neoplasm of breast: Secondary | ICD-10-CM

## 2022-02-17 ENCOUNTER — Inpatient Hospital Stay: Admission: RE | Admit: 2022-02-17 | Payer: Medicare Other | Source: Ambulatory Visit

## 2022-02-21 ENCOUNTER — Other Ambulatory Visit: Payer: Self-pay

## 2022-02-21 ENCOUNTER — Ambulatory Visit (INDEPENDENT_AMBULATORY_CARE_PROVIDER_SITE_OTHER): Payer: Medicare Other | Admitting: Family Medicine

## 2022-02-21 DIAGNOSIS — Z0000 Encounter for general adult medical examination without abnormal findings: Secondary | ICD-10-CM

## 2022-02-21 NOTE — Progress Notes (Signed)
Subjective:   Katrina Montes is a 51 y.o. female who presents for Medicare Annual (Subsequent) preventive examination.  Review of Systems    I connected with  Katrina Montes on 02/21/22 by a audio enabled telemedicine application and verified that I am speaking with the correct person using two identifiers.  Patient Location: Home  Provider Location: Office/Clinic  I discussed the limitations of evaluation and management by telemedicine. The patient expressed understanding and agreed to proceed.  Cardiac Risk Factors include: diabetes mellitus;obesity (BMI >30kg/m2)     Objective:    Today's Vitals   02/21/22 0838  PainSc: 0-No pain   There is no height or weight on file to calculate BMI.  Advanced Directives 12/23/2021 09/17/2019 01/01/2015  Does Patient Have a Medical Advance Directive? No No No  Would patient like information on creating a medical advance directive? No - Patient declined Yes (MAU/Ambulatory/Procedural Areas - Information given) No - patient declined information    Current Medications (verified) Outpatient Encounter Medications as of 02/21/2022  Medication Sig   Accu-Chek Softclix Lancets lancets Use as directed.   APPLE CIDER VINEGAR PO Take 1 tablet by mouth daily.   atorvastatin (LIPITOR) 10 MG tablet Take 1 tablet (10 mg total) by mouth daily.   blood glucose meter kit and supplies Dispense based on patient and insurance preference. Use up to four times daily as directed. (FOR ICD-10 E10.9, E11.9).   Blood Glucose Monitoring Suppl (ACCU-CHEK GUIDE ME) w/Device KIT Use as directed to check blood glucose   Blood Glucose Monitoring Suppl (BLOOD GLUCOSE SYSTEM PAK) KIT Please dispense as Guide Me System. Use as directed to monitor FSBS 3x daily. Dx: E11.9.   Cholecalciferol (VITAMIN D3) 25 MCG (1000 UT) CAPS Take 1 capsule (1,000 Units total) by mouth daily.   clotrimazole (LOTRIMIN) 1 % cream Apply topically 2 (two) times daily.   cyclobenzaprine  (FLEXERIL) 10 MG tablet Take 1 tablet (10 mg total) by mouth 2 (two) times daily as needed for muscle spasms.   DiphenhydrAMINE HCl (BENADRYL ALLERGY PO) Take 1 tablet by mouth as needed (allergy).   docusate sodium (COLACE) 50 MG capsule Take 1 capsule (50 mg total) by mouth 2 (two) times daily.   DULoxetine (CYMBALTA) 60 MG capsule Take 1 capsule (60 mg total) by mouth daily.   ELDERBERRY PO Take 1,150 mg by mouth daily.   ferrous sulfate (EQL SLOW RELEASE IRON) 160 (50 Fe) MG TBCR SR tablet Take 1 tablet (160 mg total) by mouth daily.   gabapentin (NEURONTIN) 100 MG capsule Take 1 capsule (100 mg total) by mouth 3 (three) times daily.   glucose blood (ACCU-CHEK GUIDE) test strip Use as instructed up to 6 times daily   Glucose Blood (BLOOD GLUCOSE TEST STRIPS) STRP Please dispense as Guide Me System. Use as directed to monitor FSBS 3x daily. Dx: E11.9.   ibuprofen (ADVIL,MOTRIN) 200 MG tablet Take 200 mg by mouth every 6 (six) hours as needed for headache, moderate pain or mild pain.   insulin degludec (TRESIBA) 100 UNIT/ML FlexTouch Pen Inject 20 Units into the skin at bedtime.   Insulin Pen Needle 32G X 4 MM MISC Use as directed   Lancets MISC Please dispense as Guide Me System. Use as directed to monitor FSBS 3x daily. Dx: E11.9.   lisinopril (ZESTRIL) 20 MG tablet Take 1 tablet (20 mg total) by mouth daily.   meclizine (ANTIVERT) 25 MG tablet Take 1 tablet (25 mg total) by mouth 3 (three) times  daily as needed for dizziness.   metFORMIN (GLUCOPHAGE) 500 MG tablet Take 1 tablet (500 mg total) by mouth 2 (two) times daily with a meal.   Multiple Vitamin (MULTIVITAMIN) capsule Take 1 capsule by mouth daily.   norgestimate-ethinyl estradiol (ESTARYLLA) 0.25-35 MG-MCG tablet Take 1 tablet by mouth daily.   ocrelizumab (OCREVUS) 300 MG/10ML injection Inject 300 mg into the vein every 6 (six) months.   oxyCODONE (OXY IR/ROXICODONE) 5 MG immediate release tablet Take 1 tablet (5 mg total) by mouth  every 6 (six) hours as needed for moderate pain, severe pain or breakthrough pain.   polyethylene glycol powder (GLYCOLAX/MIRALAX) 17 GM/SCOOP powder Take 1 capful (17 g) in water by mouth daily.   PRESCRIPTION MEDICATION Place 1 drop into both eyes as needed (unk).   rizatriptan (MAXALT) 10 MG tablet Take 1 tablet (10 mg total) by mouth as needed for migraine. May repeat in 2 hours if needed. Max dose: 2/24 hr or 15/30 days.   topiramate (TOPAMAX) 100 MG tablet Take 1 tablet (100 mg total) by mouth at bedtime.   No facility-administered encounter medications on file as of 02/21/2022.    Allergies (verified) Patient has no known allergies.   History: Past Medical History:  Diagnosis Date   Anemia    Anxiety    Back pain    Carpal tunnel syndrome    Constipation    Depression    Diabetes mellitus    Diabetes mellitus, type II (HCC)    Hyperlipidemia    Hypertension    Joint pain    Lactose intolerance    Multiple sclerosis (HCC)    Optic neuritis    Shortness of breath    Swallowing difficulty    Swelling of both lower extremities    Vision, loss, sudden    Vitamin D deficiency    Past Surgical History:  Procedure Laterality Date   BREAST REDUCTION SURGERY     CYST EXCISION     INCISION AND DRAINAGE ABSCESS Left 12/23/2021   Procedure: INCISION AND DRAINAGE BUTTOCK ABSCESS;  Surgeon: Stark Klein, MD;  Location: [facility];  Service: General;  Laterality: Left;   ovarian cyst removed     TUBAL LIGATION     WISDOM TOOTH EXTRACTION     Family History  Problem Relation Age of Onset   Cancer Mother    Diabetes Mother    Hearing loss Mother    Thyroid nodules Mother    Anemia Mother    Kidney disease Mother    Hypertension Mother    Obesity Mother    Breast cancer Mother    Hyperlipidemia Father    High blood pressure Father    Sleep apnea Father    Pancreatic cancer Father    Anemia Sister    Anemia Sister    Pancreatic cancer Maternal Uncle    Kidney disease  Paternal Uncle    Arthritis Maternal Grandmother    Arthritis Maternal Grandfather    Arthritis Paternal Grandmother    Kidney disease Paternal Grandmother    Hypertension Paternal Grandmother    Arthritis Paternal Grandfather    Vision loss Paternal Grandfather    Stroke Paternal Grandfather    Hyperlipidemia Paternal Grandfather    Colon cancer Neg Hx    Social History   Socioeconomic History   Marital status: Single    Spouse name: Not on file   Number of children: 0   Years of education: College   Highest education level: Not on file  Occupational History   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   Right handed.   Uses caffeine sparingly.   Social Determinants of Health   Financial Resource Strain: Low Risk    Difficulty of Paying Living Expenses: Not very hard  Food Insecurity: No Food Insecurity   Worried About Charity fundraiser in the Last Year: Never true   Ran Out of Food in the Last Year: Never true  Transportation Needs: No Transportation Needs   Lack of Transportation (Medical): No   Lack of Transportation (Non-Medical): No  Physical Activity: Insufficiently Active   Days of Exercise per Week: 3 days   Minutes of Exercise per Session: 30 min  Stress: No Stress Concern Present   Feeling of Stress : Not at all  Social Connections: Moderately Integrated   Frequency of Communication with Friends and Family: Twice a week   Frequency of Social Gatherings with Friends and Family: Twice a week   Attends Religious Services: 1 to 4 times per year   Active Member of [employer] or Organizations: Yes   Attends Archivist Meetings: Never   Marital Status: Never married     Tobacco Counseling Counseling given: Not Answered   Clinical Intake:  Pre-visit preparation completed: No  Pain : No/denies pain Pain Score: 0-No pain     Nutritional Status: BMI > 30  Obese Nutritional Risks: None Diabetes: Yes CBG done?: Yes (110 this morning 02/21/2022) CBG resulted in Enter/ Edit results?: No Did pt. bring in CBG monitor from home?: No  How often do you need to have someone help you when you read instructions, pamphlets, or other written materials from your doctor or pharmacy?: 1 - Never What is the last grade level you completed in school?: ungraduate  Diabetic? Nutrition Risk Assessment:  Has the patient had any N/V/D within the last 2 months?  Yes  Does the patient have any non-healing wounds?  No  Has the patient had any unintentional weight loss or weight gain?  No   Diabetes:  Is the patient diabetic?  Yes  If diabetic, was a CBG obtained today?  Yes  Did the patient bring in their glucometer from home?  No  How often do you monitor your CBG's? Three times a day.   Financial Strains and Diabetes Management:  Are you having any financial strains with the device, your supplies or your medication? No .  Does the patient want to be seen by Chronic Care Management for management of their diabetes?   Already started Would the patient like to be referred to a Nutritionist or for Diabetic Management?   Already started  Diabetic Exams:  Diabetic Eye Exam: Completed 08/2021 , will be due in 08/2022 Diabetic Foot Exam: Completed 01/27/2022           Activities of Daily Living In your present state of health, do you have any difficulty performing the following activities: 02/21/2022 02/17/2022  Hearing? Y Y  Comment slight hearing loss;due to MS (multiple sclerosis) -  Vision? N Y  Difficulty concentrating or making decisions? Y Y  Comment sometimes -  Walking or climbing stairs?  Y Y  Dressing or bathing? N N  Doing errands, shopping? N -   Preparing Food and eating ? N Y  Using the Toilet? N Y  In the past six months, have you accidently leaked urine? Y Y  Comment due to MS (multiple sclerosis) -  Do you have problems with loss of bowel control? Y Y  Comment due to MS (multiple sclerosis) -  Managing your Medications? N N  Managing your Finances? N N  Housekeeping or managing your Housekeeping? Y Y  Comment due to MS (multiple sclerosis) -  Some recent data might be hidden    Patient Care Team: Renee Rival, FNP as PCP - General (Nurse Practitioner)  Indicate any recent Medical Services you may have received from other than [facility] providers in the past year (date may be approximate).     Assessment:   This is a routine wellness examination for Takiyah.  Hearing/Vision screen No results found.  Dietary issues and exercise activities discussed: Current Exercise Habits: Home exercise routine, Type of exercise: walking, Time (Minutes): 30, Frequency (Times/Week): 3, Weekly Exercise (Minutes/Week): 90, Intensity: Mild, Exercise limited by: neurologic condition(s)   Goals Addressed   None    Depression Screen PHQ 2/9 Scores 02/21/2022 01/27/2022 11/19/2020 01/05/2020 09/17/2019 03/14/2019 01/14/2019  PHQ - 2 Score 2 0 0 3 0 0 2  PHQ- 9 Score 2 - - 11 - 9 14  Exception Documentation - - - - - - Medical reason    Fall Risk Fall Risk  02/21/2022 02/17/2022 01/27/2022 11/19/2020 01/05/2020  Falls in the past year? 1 1 0 0 1  Comment - - - - -  Number falls in past yr: 0 0 0 - 1  Injury with Fall? 0 0 0 - 0  Risk for fall due to : Impaired mobility;Impaired vision;Medication side effect;Impaired balance/gait - No Fall Risks No Fall Risks History of fall(s);Other (Comment)  Risk for fall due to: Comment - - - - MS  Follow up Education provided - Falls evaluation completed Falls evaluation completed Falls evaluation completed    Shaniko:  Any stairs in or around the home? Yes  If so,  are there any without handrails? No  Home free of loose throw rugs in walkways, pet beds, electrical cords, etc? Yes  Adequate lighting in your home to reduce risk of falls? Yes   ASSISTIVE DEVICES UTILIZED TO PREVENT FALLS:  Life alert? No  Use of a cane, walker or w/c? Yes  Grab bars in the bathroom? Yes  Shower chair or bench in shower? No  Elevated toilet seat or a handicapped toilet? Yes    Cognitive Function:        Immunizations Immunization History  Administered Date(s) Administered   Influenza Whole 10/24/2010   Influenza,inj,Quad PF,6+ Mos 09/01/2014, 03/14/2019, 09/17/2019, 12/02/2020, 01/27/2022   Moderna SARS-COV2 Booster Vaccination 11/16/2020   Moderna Sars-Covid-2 Vaccination 02/09/2020, 03/09/2020   Pneumococcal Polysaccharide-23 08/04/2014   Tdap 09/17/2019    TDAP status: Up to date  Flu Vaccine status: Up to date  Pneumococcal vaccine status: Declined,  Education has been provided regarding the importance of this vaccine but patient still declined. Advised may receive this vaccine at local pharmacy or Health Dept. Aware to provide a copy of the vaccination record if obtained from local pharmacy or Health Dept. Verbalized acceptance and understanding.   Covid-19 vaccine status: Completed vaccines  Qualifies for Shingles Vaccine? No   Zostavax completed No  Shingrix Completed?: No.    Education has been provided regarding the importance of this vaccine. Patient has been advised to call insurance company to determine out of pocket expense if they have not yet received this vaccine. Advised may also receive vaccine at local pharmacy or Health Dept. Verbalized acceptance and understanding.  Screening Tests Health Maintenance  Topic Date Due   COLONOSCOPY (Pts 45-43yrs Insurance coverage will need to be confirmed)  Never done   COVID-19 Vaccine (3 - Booster for Moderna series) 01/11/2021   OPHTHALMOLOGY EXAM  09/02/2021   HEMOGLOBIN A1C  06/22/2022   PAP  SMEAR-Modifier  09/16/2022   FOOT EXAM  01/27/2023   TETANUS/TDAP  09/16/2029   INFLUENZA VACCINE  Completed   Hepatitis C Screening  Completed   HIV Screening  Completed   HPV VACCINES  Aged Out    Health Maintenance  Health Maintenance Due  Topic Date Due   COLONOSCOPY (Pts 45-12yrs Insurance coverage will need to be confirmed)  Never done   COVID-19 Vaccine (3 - Booster for Moderna series) 01/11/2021   OPHTHALMOLOGY EXAM  09/02/2021    Colorectal cancer screening: Type of screening: Colonoscopy. Completed will completed in April, 2023. Repeat every 10 years  Mammogram status: Completed 02/2022. Repeat every year 2 years  Bone Density status: Not yet due  Lung Cancer Screening: (Low Dose CT Chest recommended if Age 28-80 years, 30 pack-year currently smoking OR have quit w/in 15years.) does not qualify.   Lung Cancer Screening Referral: n/a  Additional Screening:  Hepatitis C Screening: does qualify; Completed 01/14/2015  Vision Screening: Recommended annual ophthalmology exams for early detection of glaucoma and other disorders of the eye. Is the patient up to date with their annual eye exam?   due 08/2022 Who is the provider or what is the name of the office in which the patient attends annual eye exams? [facility] with Dr. Madelin Headings If pt is not established with a provider, would they like to be referred to a provider to establish care?  N/a .   Dental Screening: Recommended annual dental exams for proper oral hygiene      Plan:     I have personally reviewed and noted the following in the patients chart:   Medical and social history Use of alcohol, tobacco or illicit drugs  Current medications and supplements including opioid prescriptions.  Functional ability and status Nutritional status Physical activity Advanced directives List of other physicians Hospitalizations, surgeries, and ER visits in previous 12 months Vitals Screenings to include cognitive,  depression, and falls Referrals and appointments  In addition, I have reviewed and discussed with patient certain preventive protocols, quality metrics, and best practice recommendations. A written personalized care plan for preventive services as well as general preventive health recommendations were provided to patient.     Kathlyn Sacramento, RN   02/21/2022   Nurse Notes:   Ms. Mathurin , Thank you for taking time to come for your Medicare Wellness Visit. I appreciate your ongoing commitment to your health goals. Please review the following plan we discussed and let me know if I can assist you in the future.   These are the goals we discussed:  Goals   None     This is a list of the screening recommended for you and due dates:  Health Maintenance  Topic Date Due   Colon Cancer Screening  Never done   COVID-19 Vaccine (3 - Booster for Moderna series) 01/11/2021   Eye exam  for diabetics  09/02/2021   Hemoglobin A1C  06/22/2022   Pap Smear  09/16/2022   Complete foot exam   01/27/2023   Tetanus Vaccine  09/16/2029   Flu Shot  Completed   Hepatitis C Screening: USPSTF Recommendation to screen - Ages 18-79 yo.  Completed   HIV Screening  Completed   HPV Vaccine  Aged Out

## 2022-02-21 NOTE — Patient Instructions (Signed)
?  Katrina Montes , ?Thank you for taking time to come for your Medicare Wellness Visit. I appreciate your ongoing commitment to your health goals. Please review the following plan we discussed and let me know if I can assist you in the future.  ? ?These are the goals we discussed: ? Goals   ?None ?  ?  ?This is a list of the screening recommended for you and due dates:  ?Health Maintenance  ?Topic Date Due  ? Colon Cancer Screening  Never done  ? COVID-19 Vaccine (3 - Booster for Moderna series) 01/11/2021  ? Eye exam for diabetics  09/02/2021  ? Hemoglobin A1C  06/22/2022  ? Pap Smear  09/16/2022  ? Complete foot exam   01/27/2023  ? Tetanus Vaccine  09/16/2029  ? Flu Shot  Completed  ? Hepatitis C Screening: USPSTF Recommendation to screen - Ages 38-79 yo.  Completed  ? HIV Screening  Completed  ? HPV Vaccine  Aged Out  ?  ? ?

## 2022-02-22 ENCOUNTER — Encounter: Payer: Self-pay | Admitting: *Deleted

## 2022-02-23 ENCOUNTER — Encounter: Payer: Medicare Other | Attending: Nurse Practitioner | Admitting: Nutrition

## 2022-02-23 ENCOUNTER — Other Ambulatory Visit: Payer: Self-pay

## 2022-02-23 VITALS — Ht 66.0 in | Wt 258.0 lb

## 2022-02-23 DIAGNOSIS — G35 Multiple sclerosis: Secondary | ICD-10-CM | POA: Diagnosis present

## 2022-02-23 DIAGNOSIS — E782 Mixed hyperlipidemia: Secondary | ICD-10-CM | POA: Diagnosis present

## 2022-02-23 DIAGNOSIS — E1165 Type 2 diabetes mellitus with hyperglycemia: Secondary | ICD-10-CM | POA: Diagnosis present

## 2022-02-23 NOTE — Progress Notes (Unsigned)
Medical Nutrition Therapy  Appointment Start time:  0800  Appointment End time:  0900  Primary concerns today: Diabetes Type 2, Obesity Referral diagnosis: E11.8, E66.9 Preferred learning style: NO preference.  Learning readiness: Change in progress  NUTRITION ASSESSMENT  DX over 15 yrs ago.  Anthropometrics  Wt Readings from Last 3 Encounters:  02/23/22 258 lb (117 kg)  02/13/22 250 lb 9.6 oz (113.7 kg)  02/02/22 258 lb 8 oz (117.3 kg)   Ht Readings from Last 3 Encounters:  02/23/22 5\' 6"  (1.676 m)  02/13/22 5\' 6"  (1.676 m)  02/02/22 5\' 6"  (1.676 m)   Body mass index is 41.64 kg/m. @BMIFA @ Facility age limit for growth percentiles is 20 years. Facility age limit for growth percentiles is 20 years.    Clinical Medical Hx: *** Medications: *** Labs:  7 days 114 mg/dl.117 mg/dl,  3o days  Lab Results  Component Value Date   HGBA1C 12.4 (H) 12/23/2021   CMP Latest Ref Rng & Units 02/13/2022 01/27/2022 12/26/2021  Glucose 70 - 99 mg/dL 02/20/2022) 88 02/15/2022)  BUN 6 - 24 mg/dL 13 15 13   Creatinine 0.57 - 1.00 mg/dL 03/27/2022 02/23/2022 476(L  Sodium 134 - 144 mmol/L 140 138 136  Potassium 3.5 - 5.2 mmol/L 4.3 4.6 4.1  Chloride 96 - 106 mmol/L 104 108(H) 104  CO2 20 - 29 mmol/L 20 17(L) 25  Calcium 8.7 - 10.2 mg/dL 9.7 9.3 465(K)  Total Protein 6.0 - 8.5 g/dL 7.5 7.3 -  Total Bilirubin 0.0 - 1.2 mg/dL 0.3 0.5 -  Alkaline Phos 44 - 121 IU/L 71 61 -  AST 0 - 40 IU/L 10 14 -  ALT 0 - 32 IU/L 12 11 -   Lipid Panel     Component Value Date/Time   CHOL 169 02/13/2022 1004   TRIG 69 02/13/2022 1004   HDL 67 02/13/2022 1004   CHOLHDL 2.5 02/13/2022 1004   CHOLHDL 2.5 11/19/2020 0931   VLDL 23 11/02/2014 0944   LDLCALC 89 02/13/2022 1004   LDLCALC 87 11/19/2020 0931   LABVLDL 13 02/13/2022 1004    Notable Signs/Symptoms: None  Lifestyle & Dietary Hx Single and lives with her parents. Has MS. On disability.   Estimated daily fluid intake: 100 oz Supplements: Elderberry Sleep:  6 hrs getting sleep apnea machine C Pap Stress / self-care: MS Current average weekly physical activity: Walking three time per week. 30 minutes  24-Hr Dietary Recall First Meal: 14/02/2020 bacon 2 slices,  1/2 shake adkins shake Snack:  Second Meal: adkins crustless pie and salad,  life 11/04/2014, water Snack: *** Third Meal: salad-water Snack:  Beverages: water a lot.  Estimated Energy Needs Calories: *** Carbohydrate: ***g Protein: ***g Fat: ***g   NUTRITION DIAGNOSIS  {CHL AMB NUTRITIONAL [phone]}   NUTRITION INTERVENTION  Nutrition education (E-1) on the following topics:  ***  Handouts Provided Include  ***  Learning Style & Readiness for Change Teaching method utilized: Visual & Auditory  Demonstrated degree of understanding via: Teach Back  Barriers to learning/adherence to lifestyle change: ***  Goals Established by Pt ***   MONITORING & EVALUATION Dietary intake, weekly physical activity, and *** in ***.  Next Steps  Patient is to ***.

## 2022-02-23 NOTE — Patient Instructions (Signed)
Goals ? ?Continue to increase more plant based foods ?Avoid low carb/keto ?Lose 2-3 lbs per month ?Work on getting  6-8 hours of sleep nighty ?Increase walking to 45 minutes to 3-4 times per week ?Get A1C t0 7% or less. ?Call MD if BS are less than 70 mg/dl. 2-3 times in a row or weekly. ?Lifestyle Medicine ?- Whole Food, Plant Predominant Nutrition is highly recommended: Eat Plenty of vegetables, Mushrooms, fruits, Legumes, Whole Grains, Nuts, seeds in lieu of processed meats, processed snacks/pastries red meat, poultry, eggs.  ?  ?-It is better to avoid simple carbohydrates including: Cakes, Sweet Desserts, Ice Cream, Soda (diet and regular), Sweet Tea, Candies, Chips, Cookies, Store Bought Juices, Alcohol in Excess of  1-2 drinks a day, Lemonade,  Artificial Sweeteners, Doughnuts, Coffee Creamers, "Sugar-free" Products, etc, etc.  This is not a complete list..... ? ?Exercise: If you are able: 30 -60 minutes a day ,4 days a week, or 150 minutes a week.  The longer the better.  Combine stretch, strength, and aerobic activities.  If you were told in the past that you have high risk for cardiovascular diseases, you may seek evaluation by your heart doctor prior to initiating moderate to intense exercise programs. ?

## 2022-02-26 ENCOUNTER — Encounter: Payer: Self-pay | Admitting: Nurse Practitioner

## 2022-02-28 NOTE — Telephone Encounter (Signed)
Spoke with pt she states that it is already draining will call if need to ?

## 2022-03-07 ENCOUNTER — Ambulatory Visit
Admission: RE | Admit: 2022-03-07 | Discharge: 2022-03-07 | Disposition: A | Discharge status: Home / Self Care | Payer: Medicare Other | Source: Ambulatory Visit | Attending: Neurology | Admitting: Neurology

## 2022-03-07 ENCOUNTER — Other Ambulatory Visit: Payer: Self-pay

## 2022-03-07 DIAGNOSIS — G35 Multiple sclerosis: Secondary | ICD-10-CM

## 2022-03-07 MED ORDER — GADOBENATE DIMEGLUMINE 529 MG/ML IV SOLN
20.0000 mL | Freq: Once | INTRAVENOUS | Status: AC | PRN
  Administered 2022-03-07: 20 mL via INTRAVENOUS

## 2022-03-13 ENCOUNTER — Encounter: Payer: Self-pay | Admitting: Nutrition

## 2022-03-28 ENCOUNTER — Other Ambulatory Visit (HOSPITAL_COMMUNITY)
Admission: RE | Admit: 2022-03-28 | Discharge: 2022-03-28 | Disposition: A | Discharge status: Home / Self Care | Payer: Medicare Other | Source: Ambulatory Visit | Attending: Nurse Practitioner | Admitting: Nurse Practitioner

## 2022-03-28 ENCOUNTER — Ambulatory Visit (INDEPENDENT_AMBULATORY_CARE_PROVIDER_SITE_OTHER): Payer: Medicare Other | Admitting: Nurse Practitioner

## 2022-03-28 ENCOUNTER — Encounter: Payer: Self-pay | Admitting: Nurse Practitioner

## 2022-03-28 ENCOUNTER — Other Ambulatory Visit: Payer: Self-pay | Admitting: Nurse Practitioner

## 2022-03-28 VITALS — BP 120/85 | HR 97 | Ht 66.0 in | Wt 249.0 lb

## 2022-03-28 DIAGNOSIS — E782 Mixed hyperlipidemia: Secondary | ICD-10-CM

## 2022-03-28 DIAGNOSIS — E1165 Type 2 diabetes mellitus with hyperglycemia: Secondary | ICD-10-CM | POA: Diagnosis not present

## 2022-03-28 DIAGNOSIS — G629 Polyneuropathy, unspecified: Secondary | ICD-10-CM | POA: Diagnosis not present

## 2022-03-28 DIAGNOSIS — R5383 Other fatigue: Secondary | ICD-10-CM

## 2022-03-28 DIAGNOSIS — E11622 Type 2 diabetes mellitus with other skin ulcer: Secondary | ICD-10-CM

## 2022-03-28 DIAGNOSIS — Z1211 Encounter for screening for malignant neoplasm of colon: Secondary | ICD-10-CM | POA: Diagnosis not present

## 2022-03-28 DIAGNOSIS — Z01419 Encounter for gynecological examination (general) (routine) without abnormal findings: Secondary | ICD-10-CM | POA: Insufficient documentation

## 2022-03-28 DIAGNOSIS — Z0000 Encounter for general adult medical examination without abnormal findings: Secondary | ICD-10-CM

## 2022-03-28 DIAGNOSIS — I10 Essential (primary) hypertension: Secondary | ICD-10-CM | POA: Diagnosis not present

## 2022-03-28 DIAGNOSIS — Z1212 Encounter for screening for malignant neoplasm of rectum: Secondary | ICD-10-CM

## 2022-03-28 DIAGNOSIS — Z0001 Encounter for general adult medical examination with abnormal findings: Secondary | ICD-10-CM | POA: Diagnosis not present

## 2022-03-28 DIAGNOSIS — Z1151 Encounter for screening for human papillomavirus (HPV): Secondary | ICD-10-CM | POA: Diagnosis not present

## 2022-03-28 DIAGNOSIS — L98429 Non-pressure chronic ulcer of back with unspecified severity: Secondary | ICD-10-CM

## 2022-03-28 MED ORDER — AMOXICILLIN-POT CLAVULANATE 875-125 MG PO TABS: 1.0000 | ORAL_TABLET | Freq: Two times a day (BID) | ORAL | 0 refills | Status: DC

## 2022-03-28 NOTE — Assessment & Plan Note (Signed)
Lab Results  ?Component Value Date  ? CHOL 169 02/13/2022  ? HDL 67 02/13/2022  ? LDLCALC 89 02/13/2022  ? TRIG 69 02/13/2022  ? CHOLHDL 2.5 02/13/2022  ?Taking atorvastatin 10 mg daily,LDL  goal is less than 70. ?We will recheck lipid panel at next visit ?Avoid fried fatty foods.  ?

## 2022-03-28 NOTE — Assessment & Plan Note (Signed)
Still having numbness and tingling of bilateral feet  ?Start gabapentin 300mg  TID.  ?With better control of Type 2 DM , hoping that her symptoms will get better.  ?She has discussed symptom with neurology and they told her to continue gabapentin.  ?

## 2022-03-28 NOTE — Assessment & Plan Note (Signed)
Chronic condition ?Probably due to anemia , on iron pill ferous sulphate 160mg  daily ?Hemoglobin gradually improving.  ?Engage in daily  regular exercises ? ?

## 2022-03-28 NOTE — Addendum Note (Signed)
Addended by: Donell Beers on: 03/28/2022 10:50 PM ? ? Modules accepted: Orders ? ?

## 2022-03-28 NOTE — Assessment & Plan Note (Signed)
BP Readings from Last 3 Encounters:  ?03/28/22 120/85  ?02/13/22 113/79  ?02/02/22 119/83  ?continue lisinopril 20mg  daily. ?DASH diet , engage in regular exercise at least 150 minutes a week.  ?

## 2022-03-28 NOTE — Progress Notes (Addendum)
Established Patient Office Visit  Subjective:  Patient ID: Katrina Montes, female    DOB: 1971-05-19  Age: 51 y.o. MRN: [id number]  CC:  Chief Complaint  Patient presents with   Annual Exam    CPE   Gynecologic Exam   Foot Injury    Foot pain     HPI Katrina Montes with past medical history of essential hypertension, obstructive sleep apnea, uncontrolled type 2 diabetes mellitus with hyperglycemia, neuropathy, left buttocks abscess presents for annual physical exam . She has been taking gabapentin, still having numbness and tingling in her feet. She is seeming neurology for MS , she discussed her neuropathy with neurology and they told her to keep taking gabapentin, she has been feeling fatigued since she started neurontin. Has uncontrolled type 2 DM.   Patient complaint of wound on her left lower back states states that it started like a boil about a month ago patient denies fever, chills, states that she has been applying Neosporin cream.  States that her buttocks abscess has healed completely.   Recent mammogram was normal, patient deferred breast exam today.  Katrina Montes CMA present as chaperone.  Past Medical History:  Diagnosis Date   Anemia    Anxiety    Back pain    Carpal tunnel syndrome    Constipation    Depression    Diabetes mellitus    Diabetes mellitus, type II (HCC)    Hyperlipidemia    Hypertension    Joint pain    Lactose intolerance    Multiple sclerosis (HCC)    Optic neuritis    Shortness of breath    Swallowing difficulty    Swelling of both lower extremities    Vision, loss, sudden    Vitamin D deficiency     Past Surgical History:  Procedure Laterality Date   BREAST REDUCTION SURGERY     CYST EXCISION     INCISION AND DRAINAGE ABSCESS Left 12/23/2021   Procedure: INCISION AND DRAINAGE BUTTOCK ABSCESS;  Surgeon: Almond Lint, MD;  Location: [facility] OR;  Service: General;  Laterality: Left;   ovarian cyst removed     TUBAL LIGATION      WISDOM TOOTH EXTRACTION      Family History  Problem Relation Age of Onset   Cancer Mother    Diabetes Mother    Hearing loss Mother    Thyroid nodules Mother    Anemia Mother    Kidney disease Mother    Hypertension Mother    Obesity Mother    Breast cancer Mother    Hyperlipidemia Father    High blood pressure Father    Sleep apnea Father    Pancreatic cancer Father    Anemia Sister    Anemia Sister    Pancreatic cancer Maternal Uncle    Kidney disease Paternal Uncle    Arthritis Maternal Grandmother    Arthritis Maternal Grandfather    Arthritis Paternal Grandmother    Kidney disease Paternal Grandmother    Hypertension Paternal Grandmother    Arthritis Paternal Grandfather    Vision loss Paternal Grandfather    Stroke Paternal Grandfather    Hyperlipidemia Paternal Grandfather    Colon cancer Neg Hx     Social History   Socioeconomic History   Marital status: Single    Spouse name: Not on file   Number of children: 0   Years of education: College   Highest education level: Not on file  Occupational History   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   Right handed.   Social Determinants of Health   Financial Resource Strain: Low Risk    Difficulty of Paying Living Expenses: Not very hard  Food Insecurity: No Food Insecurity   Worried About Programme researcher, broadcasting/film/video in the Last Year: Never true   Ran Out of Food in the Last Year: Never true  Transportation Needs: No Transportation Needs   Lack of Transportation (Medical): No   Lack of Transportation (Non-Medical): No  Physical Activity: Insufficiently Active   Days of Exercise per Week: 3 days   Minutes of Exercise per Session:  30 min  Stress: No Stress Concern Present   Feeling of Stress : Not at all  Social Connections: Moderately Integrated   Frequency of Communication with Friends and Family: Twice a week   Frequency of Social Gatherings with Friends and Family: Twice a week   Attends Religious Services: 1 to 4 times per year   Active Member of [employer] or Organizations: Yes   Attends Banker Meetings: Never   Marital Status: Never married  Catering manager Violence: Not At Risk   Fear of Current or Ex-Partner: No   Emotionally Abused: No   Physically Abused: No   Sexually Abused: No    Outpatient Medications Prior to Visit  Medication Sig Dispense Refill   Accu-Chek Softclix Lancets lancets Use as directed. 100 each 5   APPLE CIDER VINEGAR PO Take 1 tablet by mouth daily.     atorvastatin (LIPITOR) 10 MG tablet Take 1 tablet (10 mg total) by mouth daily. 90 tablet 3   blood glucose meter kit and supplies Dispense based on patient and insurance preference. Use up to four times daily as directed. (FOR ICD-10 E10.9, E11.9). 1 each 0   Blood Glucose Monitoring Suppl (ACCU-CHEK GUIDE ME) w/Device KIT Use as directed to check blood glucose 1 kit 0   Blood Glucose Monitoring Suppl (BLOOD GLUCOSE SYSTEM PAK) KIT Please dispense as Guide Me System. Use as directed to monitor FSBS 3x daily. Dx: E11.9. 100 each 3   Cholecalciferol (VITAMIN D3) 25 MCG (1000 UT) CAPS Take 1 capsule (1,000 Units total) by mouth daily. 30 capsule 3   clotrimazole (LOTRIMIN) 1 % cream Apply topically 2 (two) times daily. 28 g 0   cyclobenzaprine (FLEXERIL) 10 MG tablet Take 1 tablet (10 mg total) by mouth 2 (two) times daily as needed for muscle spasms. 30 tablet 3   DiphenhydrAMINE HCl (BENADRYL ALLERGY PO) Take 1 tablet by mouth as needed (allergy).     docusate sodium (COLACE) 50 MG capsule Take 1 capsule (50 mg total) by mouth 2 (two) times daily. 60 capsule 11   DULoxetine (CYMBALTA) 60 MG capsule Take 1 capsule (60 mg  total) by mouth daily. 90 capsule 3   ELDERBERRY PO Take 1,150 mg by mouth daily.     ferrous sulfate (EQL SLOW RELEASE IRON) 160 (50 Fe) MG TBCR SR tablet Take 1 tablet (160 mg total) by mouth daily. 30 tablet 3   gabapentin (NEURONTIN) 100 MG capsule Take 1 capsule (100 mg total) by mouth 3 (three) times  daily. 90 capsule 3   glucose blood (ACCU-CHEK GUIDE) test strip Use as instructed up to 6 times daily 200 each 12   Glucose Blood (BLOOD GLUCOSE TEST STRIPS) STRP Please dispense as Guide Me System. Use as directed to monitor FSBS 3x daily. Dx: E11.9. 100 each 3   ibuprofen (ADVIL,MOTRIN) 200 MG tablet Take 200 mg by mouth every 6 (six) hours as needed for headache, moderate pain or mild pain.     insulin degludec (TRESIBA) 100 UNIT/ML FlexTouch Pen Inject 20 Units into the skin at bedtime. 15 mL 1   Insulin Pen Needle 32G X 4 MM MISC Use as directed 100 each 0   Lancets MISC Please dispense as Guide Me System. Use as directed to monitor FSBS 3x daily. Dx: E11.9. 100 each 3   lisinopril (ZESTRIL) 20 MG tablet Take 1 tablet (20 mg total) by mouth daily. 30 tablet 11   meclizine (ANTIVERT) 25 MG tablet Take 1 tablet (25 mg total) by mouth 3 (three) times daily as needed for dizziness. 30 tablet 6   metFORMIN (GLUCOPHAGE) 500 MG tablet Take 1 tablet (500 mg total) by mouth 2 (two) times daily with a meal. 60 tablet 11   Multiple Vitamin (MULTIVITAMIN) capsule Take 1 capsule by mouth daily.     ocrelizumab (OCREVUS) 300 MG/10ML injection Inject 300 mg into the vein every 6 (six) months.     polyethylene glycol powder (GLYCOLAX/MIRALAX) 17 GM/SCOOP powder Take 1 capful (17 g) in water by mouth daily. 238 g 0   PRESCRIPTION MEDICATION Place 1 drop into both eyes as needed (unk).     rizatriptan (MAXALT) 10 MG tablet Take 1 tablet (10 mg total) by mouth as needed for migraine. May repeat in 2 hours if needed. Max dose: 2/24 hr or 15/30 days. 45 tablet 3   topiramate (TOPAMAX) 100 MG tablet Take 1  tablet (100 mg total) by mouth at bedtime. 90 tablet 4   oxyCODONE (OXY IR/ROXICODONE) 5 MG immediate release tablet Take 1 tablet (5 mg total) by mouth every 6 (six) hours as needed for moderate pain, severe pain or breakthrough pain. 15 tablet 0   norgestimate-ethinyl estradiol (ESTARYLLA) 0.25-35 MG-MCG tablet Take 1 tablet by mouth daily. (Patient not taking: Reported on 02/23/2022) 84 tablet 3   No facility-administered medications prior to visit.    No Known Allergies  ROS Review of Systems  Constitutional:  Positive for fatigue.  HENT: Negative.    Eyes: Negative.   Respiratory: Negative.    Cardiovascular: Negative.   Gastrointestinal: Negative.   Endocrine: Negative.   Genitourinary: Negative.   Musculoskeletal: Negative.   Skin:        Has wound on her left lower back.   Allergic/Immunologic: Negative.   Neurological: Negative.   Hematological: Negative.   Psychiatric/Behavioral: Negative.       Objective:    Physical Exam Exam conducted with a chaperone present.  Constitutional:      General: She is not in acute distress.    Appearance: She is obese. She is not ill-appearing, toxic-appearing or diaphoretic.  HENT:     Head: Normocephalic and atraumatic.     Right Ear: Tympanic membrane, ear canal and external ear normal. There is no impacted cerumen.     Left Ear: Tympanic membrane, ear canal and external ear normal. There is no impacted cerumen.     Nose: Nose normal. No congestion or rhinorrhea.     Mouth/Throat:     Mouth: Mucous membranes  are moist.     Pharynx: Oropharynx is clear. No oropharyngeal exudate or posterior oropharyngeal erythema.  Eyes:     General: No scleral icterus.       Right eye: No discharge.        Left eye: No discharge.     Extraocular Movements: Extraocular movements intact.     Pupils: Pupils are equal, round, and reactive to light.  Neck:     Vascular: No carotid bruit.  Cardiovascular:     Rate and Rhythm: Normal rate and  regular rhythm.     Pulses: Normal pulses.     Heart sounds: Normal heart sounds. No murmur heard.   No friction rub. No gallop.  Pulmonary:     Effort: Pulmonary effort is normal. No respiratory distress.     Breath sounds: Normal breath sounds. No stridor. No wheezing, rhonchi or rales.  Chest:     Chest wall: No tenderness.  Abdominal:     General: There is no distension.     Palpations: There is no mass.     Tenderness: There is no abdominal tenderness. There is no right CVA tenderness, left CVA tenderness, guarding or rebound.     Hernia: No hernia is present. There is no hernia in the left inguinal area or right inguinal area.  Genitourinary:    General: Normal vulva.     Exam position: Lithotomy position.     Pubic Area: No rash or pubic lice.      Tanner stage (genital): 5.     Labia:        Right: No rash, tenderness, lesion or injury.        Left: No rash, tenderness, lesion or injury.      Urethra: No prolapse, urethral pain, urethral swelling or urethral lesion.     Vagina: No signs of injury and foreign body. No vaginal discharge, erythema, tenderness, bleeding, lesions or prolapsed vaginal walls.     Cervix: No cervical motion tenderness, discharge, friability, lesion, erythema, cervical bleeding or eversion.     Uterus: Not deviated, not enlarged, not fixed, not tender and no uterine prolapse.      Adnexa: Right adnexa normal and left adnexa normal.       Right: No mass, tenderness or fullness.         Left: No mass, tenderness or fullness.    Musculoskeletal:        General: No swelling, tenderness, deformity or signs of injury.     Cervical back: Normal range of motion and neck supple. No rigidity or tenderness.     Right lower leg: No edema.     Left lower leg: No edema.  Lymphadenopathy:     Cervical: No cervical adenopathy.     Lower Body: No right inguinal adenopathy. No left inguinal adenopathy.  Skin:    General: Skin is warm.     Capillary Refill:  Capillary refill takes less than 2 seconds.     Coloration: Skin is not jaundiced or pale.     Findings: No bruising or erythema.     Comments: Ulcer noted on left lower back Wound clean has serosanguineous minimal drainage, no redness, purulence drainage noted.  Wound measuring 2.cm x 1 cm, depth of 1 cm  Neurological:     Mental Status: She is alert and oriented to person, place, and time.     Cranial Nerves: No cranial nerve deficit.     Sensory: No sensory deficit.  Motor: No weakness.     Coordination: Coordination normal.     Gait: Gait normal.     Deep Tendon Reflexes: Reflexes normal.  Psychiatric:        Mood and Affect: Mood normal.        Behavior: Behavior normal.        Thought Content: Thought content normal.        Judgment: Judgment normal.    BP 120/85 (BP Location: Left Arm, Patient Position: Sitting, Cuff Size: Large)   Pulse 97   Ht 5\' 6"  (1.676 m)   Wt 249 lb (112.9 kg)   LMP 02/14/2022 (Approximate)   SpO2 99%   BMI 40.19 kg/m  Wt Readings from Last 3 Encounters:  03/28/22 249 lb (112.9 kg)  02/23/22 258 lb (117 kg)  02/13/22 250 lb 9.6 oz (113.7 kg)     Health Maintenance Due  Topic Date Due   COLONOSCOPY (Pts 45-74yrs Insurance coverage will need to be confirmed)  Never done   COVID-19 Vaccine (3 - Booster for Moderna series) 01/11/2021    There are no preventive care reminders to display for this patient.  Lab Results  Component Value Date   TSH 0.900 01/27/2022   Lab Results  Component Value Date   WBC 5.8 01/27/2022   HGB 11.0 (L) 01/27/2022   HCT 33.8 (L) 01/27/2022   MCV 82 01/27/2022   PLT 340 01/27/2022   Lab Results  Component Value Date   NA 140 02/13/2022   K 4.3 02/13/2022   CO2 20 02/13/2022   GLUCOSE 120 (H) 02/13/2022   BUN 13 02/13/2022   CREATININE 0.69 02/13/2022   BILITOT 0.3 02/13/2022   ALKPHOS 71 02/13/2022   AST 10 02/13/2022   ALT 12 02/13/2022   PROT 7.5 02/13/2022   ALBUMIN 4.6 02/13/2022    CALCIUM 9.7 02/13/2022   ANIONGAP 7 12/26/2021   EGFR 106 02/13/2022   Lab Results  Component Value Date   CHOL 169 02/13/2022   Lab Results  Component Value Date   HDL 67 02/13/2022   Lab Results  Component Value Date   LDLCALC 89 02/13/2022   Lab Results  Component Value Date   TRIG 69 02/13/2022   Lab Results  Component Value Date   CHOLHDL 2.5 02/13/2022   Lab Results  Component Value Date   HGBA1C 12.4 (H) 12/23/2021      Assessment & Plan:   Problem List Items Addressed This Visit       Cardiovascular and Mediastinum   Essential hypertension, benign    BP Readings from Last 3 Encounters:  03/28/22 120/85  02/13/22 113/79  02/02/22 119/83  continue lisinopril 20mg  daily. DASH diet , engage in regular exercise at least 150 minutes a week.         Endocrine   Uncontrolled type 2 diabetes mellitus with hyperglycemia (HCC)    Lab Results  Component Value Date   HGBA1C 12.4 (H) 12/23/2021   Followed by endocrinology On metformin 500 mg twice daily, Tresiba 20 units daily Avoid sugar sweets soda engage in regular physical exercise 130 minutes weekly Patient encouraged to maintain close follow-up with Endo.  Urine creatinine clearance labs today      Relevant Orders   Urine Albumin-Creatinine with uACR   CBC   Diabetic ulcer of back (HCC)    Augmentin twice daily for 1 week. Keep wound clean and dry, report redness, purulent drainage, odour Patient told to follow-up with wound care center in  a week will see patient back in the office in 1 month to reassess wound.       Relevant Medications   amoxicillin-clavulanate (AUGMENTIN) 875-125 MG tablet   Other Relevant Orders   AMB referral to wound care center     Nervous and Auditory   Neuropathy    Still having numbness and tingling of bilateral feet  Start gabapentin 300mg  TID.  With better control of Type 2 DM , hoping that her symptoms will get better.  She has discussed symptom with  neurology and they told her to continue gabapentin.         Other   Fatigue    Chronic condition Probably due to anemia , on iron pill ferous sulphate 160mg  daily Hemoglobin gradually improving.  Engage in daily  regular exercises       Mixed hyperlipidemia    Lab Results  Component Value Date   CHOL 169 02/13/2022   HDL 67 02/13/2022   LDLCALC 89 02/13/2022   TRIG 69 02/13/2022   CHOLHDL 2.5 02/13/2022  Taking atorvastatin 10 mg daily,LDL  goal is less than 70. We will recheck lipid panel at next visit Avoid fried fatty foods.       Annual physical exam - Primary    Annual exam as documented.  Counseling done include healthy lifestyle involving committing to 150 minutes of exercise per week, heart healthy diet, and attaining healthy weight. The importance of adequate sleep also discussed.  Regular use of seat belt and home safety were also discussed . Changes in health habits are decided on by patient with goals and time frames set for achieving them. Immunization and cancer screening  needs are specifically addressed at this visit.         Relevant Orders   Cytology - PAP   Other Visit Diagnoses     Screening for colorectal cancer       Relevant Orders   Ambulatory referral to Gastroenterology       Meds ordered this encounter  Medications   amoxicillin-clavulanate (AUGMENTIN) 875-125 MG tablet    Sig: Take 1 tablet by mouth 2 (two) times daily.    Dispense:  14 tablet    Refill:  0    Follow-up: Return in about 4 weeks (around 04/25/2022).    Donell Beers, FNP

## 2022-03-28 NOTE — Assessment & Plan Note (Addendum)
Augmentin twice daily for 1 week. ?Keep wound clean and dry, report redness, purulent drainage, odour ?Patient told to follow-up with wound care center in a week ?will see patient back in the office in 1 month to reassess wound.  ?

## 2022-03-28 NOTE — Assessment & Plan Note (Addendum)
Lab Results  ?Component Value Date  ? HGBA1C 12.4 (H) 12/23/2021  ? ? ?Followed by endocrinology ?On metformin 500 mg twice daily, Tresiba 20 units daily ?Avoid sugar sweets soda engage in regular physical exercise 130 minutes weekly ?Patient encouraged to maintain close follow-up with Endo.  ?Urine creatinine clearance labs today ?

## 2022-03-28 NOTE — Patient Instructions (Addendum)
?  Start taking gabapentin 300mg  three times daily for your neuropathy.  ?Please keep wound clean and dry  follow up with wound care center in one week.  ? ? ?It is important that you exercise regularly at least 30 minutes 5 times a week.  ?Think about what you will eat, plan ahead. ?Choose " clean, green, fresh or frozen" over canned, processed or packaged foods which are more sugary, salty and fatty. ?70 to 75% of food eaten should be vegetables and fruit. ?Three meals at set times with snacks allowed between meals, but they must be fruit or vegetables. ?Aim to eat over a 12 hour period , example 7 am to 7 pm, and STOP after  your last meal of the day. ?Drink water,generally about 64 ounces per day, no other drink is as healthy. Fruit juice is best enjoyed in a healthy way, by EATING the fruit. ? ?Thanks for choosing [facility], we consider it a privelige to serve you. ? ?

## 2022-03-28 NOTE — Assessment & Plan Note (Signed)
Annual exam as documented.  ?Counseling done include healthy lifestyle involving committing to 150 minutes of exercise per week, heart healthy diet, and attaining healthy weight. The importance of adequate sleep also discussed.  ?Regular use of seat belt and home safety were also discussed . ?Changes in health habits are decided on by patient with goals and time frames set for achieving them. ?Immunization and cancer screening  needs are specifically addressed at this visit.   ?

## 2022-03-30 ENCOUNTER — Other Ambulatory Visit: Payer: Self-pay | Admitting: Nurse Practitioner

## 2022-03-30 ENCOUNTER — Telehealth: Payer: Self-pay

## 2022-03-30 DIAGNOSIS — A599 Trichomoniasis, unspecified: Secondary | ICD-10-CM

## 2022-03-30 LAB — CYTOLOGY - PAP
Comment: NEGATIVE
Diagnosis: NEGATIVE
High risk HPV: NEGATIVE

## 2022-03-30 MED ORDER — METRONIDAZOLE 500 MG PO TABS: 500.0000 mg | ORAL_TABLET | Freq: Two times a day (BID) | ORAL | 0 refills | Status: AC

## 2022-03-30 NOTE — Telephone Encounter (Signed)
Patient called about the wound center that was discuss in last visit. Return call # [phone].  ?

## 2022-03-30 NOTE — Progress Notes (Signed)
Stable CBC, blood count is stable, Pap smear was normal but shehas trichomonas vaginitis I have sent prescription for Flagyl 500 mg twice daily for 7 days. ?

## 2022-03-30 NOTE — Telephone Encounter (Signed)
Spoke with pt

## 2022-04-01 LAB — MICROALBUMIN / CREATININE URINE RATIO
Creatinine, Urine: 272.8 mg/dL
Microalb/Creat Ratio: 7 mg/g creat (ref 0–29)
Microalbumin, Urine: 18.8 ug/mL

## 2022-04-01 LAB — CBC
Hematocrit: 35.5 % (ref 34.0–46.6)
Hemoglobin: 11.6 g/dL (ref 11.1–15.9)
MCH: 26.7 pg (ref 26.6–33.0)
MCHC: 32.7 g/dL (ref 31.5–35.7)
MCV: 82 fL (ref 79–97)
Platelets: 402 10*3/uL (ref 150–450)
RBC: 4.35 x10E6/uL (ref 3.77–5.28)
RDW: 16 % — ABNORMAL HIGH (ref 11.7–15.4)
WBC: 5.2 10*3/uL (ref 3.4–10.8)

## 2022-04-06 ENCOUNTER — Ambulatory Visit (INDEPENDENT_AMBULATORY_CARE_PROVIDER_SITE_OTHER): Payer: Self-pay | Admitting: *Deleted

## 2022-04-06 VITALS — Ht 66.0 in | Wt 249.0 lb

## 2022-04-06 DIAGNOSIS — Z1211 Encounter for screening for malignant neoplasm of colon: Secondary | ICD-10-CM

## 2022-04-06 NOTE — Progress Notes (Signed)
Gastroenterology Pre-Procedure Review ? ?Request Date: 04/06/2022 ?Requesting Physician: Edwin Dada, FNP @ [facility], no previous TCS ? ?PATIENT REVIEW QUESTIONS: The patient responded to the following health history questions as indicated:   ? ?1. Diabetes Melitis: yes, type II  ?2. Joint replacements in the past 12 months: no ?3. Major health problems in the past 3 months: yes, bladder infection, boil infection, elevated blood sugar, MRSA 12/22/2021 ?4. Has an artificial valve or MVP: no ?5. Has a defibrillator: no ?6. Has been advised in past to take antibiotics in advance of a procedure like teeth cleaning: no ?7. Family history of colon cancer: no  ?8. Alcohol Use: no ?9. Illicit drug Use: no ?10. History of sleep apnea: yes, CPAP  ?11. History of coronary artery or other vascular stents placed within the last 12 months: no ?12. History of any prior anesthesia complications: no ?13. Body mass index is 40.19 kg/m?. ?   ?MEDICATIONS & ALLERGIES:    ?Patient reports the following regarding taking any blood thinners:   ?Plavix? no ?Aspirin? no ?Coumadin? no ?Brilinta? no ?Xarelto? no ?Eliquis? no ?Pradaxa? no ?Savaysa? no ?Effient? no ? ?Patient confirms/reports the following medications:  ?Current Outpatient Medications  ?Medication Sig Dispense Refill  ? amoxicillin-clavulanate (AUGMENTIN) 875-125 MG tablet Take 1 tablet by mouth 2 (two) times daily. 14 tablet 0  ? APPLE CIDER VINEGAR PO Take 1 tablet by mouth daily.    ? atorvastatin (LIPITOR) 10 MG tablet Take 1 tablet (10 mg total) by mouth daily. 90 tablet 3  ? Cholecalciferol (VITAMIN D3) 25 MCG (1000 UT) CAPS Take 1 capsule (1,000 Units total) by mouth daily. 30 capsule 3  ? cyclobenzaprine (FLEXERIL) 10 MG tablet Take 1 tablet (10 mg total) by mouth 2 (two) times daily as needed for muscle spasms. (Patient taking differently: Take 10 mg by mouth as needed for muscle spasms.) 30 tablet 3  ? DiphenhydrAMINE HCl (BENADRYL ALLERGY PO) Take 1  tablet by mouth as needed (allergy).    ? docusate sodium (COLACE) 50 MG capsule Take 1 capsule (50 mg total) by mouth 2 (two) times daily. (Patient taking differently: Take 50 mg by mouth as needed.) 60 capsule 11  ? DULoxetine (CYMBALTA) 60 MG capsule Take 1 capsule (60 mg total) by mouth daily. 90 capsule 3  ? ELDERBERRY PO Take 1,150 mg by mouth daily.    ? ferrous sulfate (EQL SLOW RELEASE IRON) 160 (50 Fe) MG TBCR SR tablet Take 1 tablet (160 mg total) by mouth daily. 30 tablet 3  ? gabapentin (NEURONTIN) 100 MG capsule Take 1 capsule (100 mg total) by mouth 3 (three) times daily. 90 capsule 3  ? ibuprofen (ADVIL,MOTRIN) 200 MG tablet Take 200 mg by mouth as needed for headache, moderate pain or mild pain.    ? insulin degludec (TRESIBA) 100 UNIT/ML FlexTouch Pen Inject 20 Units into the skin at bedtime. 15 mL 1  ? lisinopril (ZESTRIL) 20 MG tablet Take 1 tablet (20 mg total) by mouth daily. 30 tablet 11  ? meclizine (ANTIVERT) 25 MG tablet Take 1 tablet (25 mg total) by mouth 3 (three) times daily as needed for dizziness. (Patient taking differently: Take 25 mg by mouth as needed for dizziness.) 30 tablet 6  ? metFORMIN (GLUCOPHAGE) 500 MG tablet Take 1 tablet (500 mg total) by mouth 2 (two) times daily with a meal. 60 tablet 11  ? metroNIDAZOLE (FLAGYL) 500 MG tablet Take 1 tablet (500 mg total) by mouth 2 (two) times  daily for 7 days. 14 tablet 0  ? Multiple Vitamin (MULTIVITAMIN) capsule Take 1 capsule by mouth daily.    ? ocrelizumab (OCREVUS) 300 MG/10ML injection Inject 300 mg into the vein every 6 (six) months.    ? polyethylene glycol powder (GLYCOLAX/MIRALAX) 17 GM/SCOOP powder Take 1 capful (17 g) in water by mouth daily. (Patient taking differently: Take 17 g by mouth as needed.) 238 g 0  ? PRESCRIPTION MEDICATION Place 1 drop into both eyes at bedtime.    ? rizatriptan (MAXALT) 10 MG tablet Take 1 tablet (10 mg total) by mouth as needed for migraine. May repeat in 2 hours if needed. Max dose: 2/24  hr or 15/30 days. 45 tablet 3  ? topiramate (TOPAMAX) 100 MG tablet Take 1 tablet (100 mg total) by mouth at bedtime. 90 tablet 4  ? ?No current facility-administered medications for this visit.  ? ? ?Patient confirms/reports the following allergies:  ?No Known Allergies ? ?No orders of the defined types were placed in this encounter. ? ? ?AUTHORIZATION INFORMATION ?Primary Insurance: [employer] Medicare,  ID #: [id number],  Group #: [id number] ?Pre-Cert / Berkley Harvey required:  ?Pre-Cert / Auth #:  ? ?SCHEDULE INFORMATION: ?Procedure has been scheduled as follows:  ?Date: , Time:   ?Location: [facility] with Dr. Marletta Lor ? ?This Gastroenterology Pre-Precedure Review Form is being routed to the following provider(s): Tana Coast, PA-C ?  ?

## 2022-04-14 NOTE — Progress Notes (Signed)
ASA 3 with comorbities and BMI. Would recommend ov.  ?

## 2022-04-17 NOTE — Progress Notes (Signed)
Spoke to pt.  She was made aware that ov needed to arrange colonoscopy.  Scheduled ov for 04/24/2022 at 9:30 with Brooke Bonito, NP. ?

## 2022-04-18 ENCOUNTER — Encounter: Payer: Medicare Other | Attending: Nurse Practitioner | Admitting: Nutrition

## 2022-04-18 ENCOUNTER — Encounter: Payer: Self-pay | Admitting: "Endocrinology

## 2022-04-18 ENCOUNTER — Ambulatory Visit: Payer: Medicare Other | Admitting: "Endocrinology

## 2022-04-18 VITALS — BP 134/88 | HR 80 | Ht 66.0 in | Wt 259.6 lb

## 2022-04-18 DIAGNOSIS — E1165 Type 2 diabetes mellitus with hyperglycemia: Secondary | ICD-10-CM

## 2022-04-18 DIAGNOSIS — I10 Essential (primary) hypertension: Secondary | ICD-10-CM | POA: Diagnosis not present

## 2022-04-18 DIAGNOSIS — Z713 Dietary counseling and surveillance: Secondary | ICD-10-CM | POA: Insufficient documentation

## 2022-04-18 DIAGNOSIS — E782 Mixed hyperlipidemia: Secondary | ICD-10-CM

## 2022-04-18 LAB — POCT GLYCOSYLATED HEMOGLOBIN (HGB A1C): HbA1c, POC (controlled diabetic range): 6.4 % (ref 0.0–7.0)

## 2022-04-18 MED ORDER — TRULICITY 1.5 MG/0.5ML ~~LOC~~ SOAJ: 1.5000 mg | SUBCUTANEOUS | 2 refills | Status: DC

## 2022-04-18 NOTE — Progress Notes (Signed)
Medical Nutrition Therapy Follow up Appointment Start time:  1030   Appointment End time:  1100  Primary concerns today: Diabetes Type 2, Obesity Referral diagnosis: E11.8, E66.9 Preferred learning style: No preference.  Learning readiness: Change in progress  NUTRITION ASSESSMENT Follow up DM DX over 15 yrs ago. Has Multiple Sclerosis and neuropathy.  Changes made:  A1C down from 12.4% down to 6.4%. She stopped doing KETO diet, which was effecting her BS's. Has now has been eating 3 meals per day Cut out the sweets, took medications on time Exercising  now and being out door for an hours 3-4 times per week.   Lab Results  Component Value Date   HGBA1C 6.4 04/18/2022    BS have improved 114-130's according to her meter.  Anthropometrics  Wt Readings from Last 3 Encounters:  04/18/22 259 lb 9.6 oz (117.8 kg)  04/06/22 249 lb (112.9 kg)  03/28/22 249 lb (112.9 kg)   Ht Readings from Last 3 Encounters:  04/18/22 5\' 6"  (1.676 m)  04/06/22 5\' 6"  (1.676 m)  03/28/22 5\' 6"  (1.676 m)   There is no height or weight on file to calculate BMI. @BMIFA @ Facility age limit for growth percentiles is 20 years. Facility age limit for growth percentiles is 20 years.    Clinical Medical Hx: MS, Obesity, Type 2 DM Medications: Metformin 500 mg BID, Tresiba 20 units a day,  Labs:  7 days 114 mg/dl.117 mg/dl,  30 days mg/dl. Lab Results  Component Value Date   HGBA1C 12.4 (H) 12/23/2021      Latest Ref Rng & Units 02/13/2022   10:04 AM 01/27/2022    2:27 PM 12/26/2021    2:12 AM  CMP  Glucose 70 - 99 mg/dL 02/20/2022   88   02/15/2022    BUN 6 - 24 mg/dL 13   15   13     Creatinine 0.57 - 1.00 mg/dL 03/27/2022   02/23/2022   767    Sodium 134 - 144 mmol/L 140   138   136    Potassium 3.5 - 5.2 mmol/L 4.3   4.6   4.1    Chloride 96 - 106 mmol/L 104   108   104    CO2 20 - 29 mmol/L 20   17   25     Calcium 8.7 - 10.2 mg/dL 9.7   9.3   8.5    Total Protein 6.0 - 8.5 g/dL 7.5   7.3     Total Bilirubin  0.0 - 1.2 mg/dL 0.3   0.5     Alkaline Phos 44 - 121 IU/L 71   61     AST 0 - 40 IU/L 10   14     ALT 0 - 32 IU/L 12   11      Lipid Panel     Component Value Date/Time   CHOL 169 02/13/2022 1004   TRIG 69 02/13/2022 1004   HDL 67 02/13/2022 1004   CHOLHDL 2.5 02/13/2022 1004   CHOLHDL 2.5 11/19/2020 0931   VLDL 23 11/02/2014 0944   LDLCALC 89 02/13/2022 1004   LDLCALC 87 11/19/2020 0931   LABVLDL 13 02/13/2022 1004    Notable Signs/Symptoms: None  Lifestyle & Dietary Hx Single and lives with her parents. Has MS. On disability.   Estimated daily fluid intake: 100 oz Supplements: Elderberry Sleep: 6 hrs getting sleep apnea machine C Pap Stress / self-care: MS Current average weekly physical activity: Walking three time  per week. 30 minutes  24-Hr Dietary Recall First Meal: Eggs , greek yogurt  snack: water Second Meal:  Chicken salad or tuna, Fruit Snack: water Third Meal: Green beans, potatoes, corn, asparagus, water Snack:  Beverages: water a lot.  Estimated Energy Needs Calories: 1200 Carbohydrate: 135g Protein: 90g Fat: 33g   NUTRITION DIAGNOSIS  NB-1.1 Food and nutrition-related knowledge deficit As related to Diabetes Type 2.  As evidenced by A1C 12.4%.   NUTRITION INTERVENTION  Nutrition education (E-1) on the following topics:  Nutrition and Diabetes education provided on My Plate, CHO counting, meal planning, portion sizes, timing of meals, avoiding snacks between meals unless having a low blood sugar, target ranges for A1C and blood sugars, signs/symptoms and treatment of hyper/hypoglycemia, monitoring blood sugars, taking medications as prescribed, benefits of exercising 30 minutes per day and prevention of complications of DM.  Lifestyle Medicine - Whole Food, Plant Predominant Nutrition is highly recommended: Eat Plenty of vegetables, Mushrooms, fruits, Legumes, Whole Grains, Nuts, seeds in lieu of processed meats, processed snacks/pastries red  meat, poultry, eggs.    -It is better to avoid simple carbohydrates including: Cakes, Sweet Desserts, Ice Cream, Soda (diet and regular), Sweet Tea, Candies, Chips, Cookies, Store Bought Juices, Alcohol in Excess of  1-2 drinks a day, Lemonade,  Artificial Sweeteners, Doughnuts, Coffee Creamers, "Sugar-free" Products, etc, etc.  This is not a complete list.....  Exercise: If you are able: 30 -60 minutes a day ,4 days a week, or 150 minutes a week.  The longer the better.  Combine stretch, strength, and aerobic activities.  If you were told in the past that you have high risk for cardiovascular diseases, you may seek evaluation by your heart doctor prior to initiating moderate to intense exercise programs.   Handouts Provided Include  Lifestyle medicine meal plan Calorie density of foods Meal Plan Card Plant based grocery list.  Learning Style & Readiness for Change Teaching method utilized: Visual & Auditory  Demonstrated degree of understanding via: Teach Back  Barriers to learning/adherence to lifestyle change: none  Goals Established by Pt Goals  Lifestyle Medicine - Whole Food, Plant Predominant Nutrition is highly recommended: Eat Plenty of vegetables, Mushrooms, fruits, Legumes, Whole Grains, Nuts, seeds in lieu of processed meats, processed snacks/pastries red meat, poultry, eggs.    -It is better to avoid simple carbohydrates including: Cakes, Sweet Desserts, Ice Cream, Soda (diet and regular), Sweet Tea, Candies, Chips, Cookies, Store Bought Juices, Alcohol in Excess of  1-2 drinks a day, Lemonade,  Artificial Sweeteners, Doughnuts, Coffee Creamers, "Sugar-free" Products, etc, etc.  This is not a complete list.....  Exercise: If you are able: 30 -60 minutes a day ,4 days a week, or 150 minutes a week.  The longer the better.  Combine stretch, strength, and aerobic activities.  If you were told in the past that you have high risk for cardiovascular diseases, you may seek evaluation  by your heart doctor prior to initiating moderate to intense exercise programs.   MONITORING & EVALUATION Dietary intake, weekly physical activity, and BS and weight in 1 month.  Next Steps  Patient is to work on better meal planning.

## 2022-04-18 NOTE — Progress Notes (Signed)
? ?                                                             Endocrinology Consult Note  ?     04/18/2022, 12:47 PM ? ? ?Subjective:  ? ? Patient ID: Katrina Montes, female    DOB: [birth year].  ?Katrina Montes is being seen in consultation for management of currently uncontrolled symptomatic diabetes requested by  Donell Beers, FNP. ? ? ?Past Medical History:  ?Diagnosis Date  ? Anemia   ? Anxiety   ? Back pain   ? Carpal tunnel syndrome   ? Constipation   ? Depression   ? Diabetes mellitus   ? Diabetes mellitus, type II (HCC)   ? Hyperlipidemia   ? Hypertension   ? Joint pain   ? Lactose intolerance   ? Multiple sclerosis (HCC)   ? Optic neuritis   ? Shortness of breath   ? Swallowing difficulty   ? Swelling of both lower extremities   ? Vision, loss, sudden   ? Vitamin D deficiency   ? ? ?Past Surgical History:  ?Procedure Laterality Date  ? BREAST REDUCTION SURGERY    ? CYST EXCISION    ? INCISION AND DRAINAGE ABSCESS Left 12/23/2021  ? Procedure: INCISION AND DRAINAGE BUTTOCK ABSCESS;  Surgeon: Almond Lint, MD;  Location: [facility] OR;  Service: General;  Laterality: Left;  ? ovarian cyst removed    ? TUBAL LIGATION    ? WISDOM TOOTH EXTRACTION    ? ? ?Social History  ? ?Socioeconomic History  ? Marital status: Single  ?  Spouse name: Not on file  ? Number of children: 0  ? Years of education: College  ? Highest education level: Not on file  ?Occupational History  ? 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 Smoking status: Never  ? Smokeless tobacco: Never  ?Vaping Use  ? Vaping Use: Never used  ?Substance and Sexual Activity  ? Alcohol use: No  ? Drug use: No  ? Sexual activity: Not Currently  ?Other Topics Concern  ? Not on file  ?Social History Narrative  ? Live at home with parents.  ? Right handed.  ? ?Social Determinants of Health  ? ?Financial Resource Strain: Low Risk   ? Difficulty of Paying Living Expenses: Not very hard  ?Food Insecurity: No Food Insecurity  ?  Worried About Programme researcher, broadcasting/film/video in the Last Year: Never true  ? Ran Out of Food in the Last Year: Never true  ?Transportation Needs: No Transportation Needs  ? Lack of Transportation (Medical): No  ? Lack of Transportation (Non-Medical): No  ?Physical Activity: Insufficiently Active  ? Days of Exercise per Week: 3 days  ? Minutes of Exercise per Session: 30 min  ?Stress: No Stress Concern Present  ? Feeling of Stress : Not at all  ?Social Connections: Moderately Integrated  ? Frequency of Communication with Friends and Family: Twice a week  ? Frequency of Social Gatherings with Friends and Family: Twice a week  ? Attends Religious Services: 1 to 4 times per year  ? Active Member of Clubs or Organizations: Yes  ? Attends Banker Meetings: Never  ? Marital Status: Never married  ? ? ?Family History  ?Problem  Relation Age of Onset  ? Cancer Mother   ? Diabetes Mother   ? Hearing loss Mother   ? Thyroid nodules Mother   ? Anemia Mother   ? Kidney disease Mother   ? Hypertension Mother   ? Obesity Mother   ? Breast cancer Mother   ? Hyperlipidemia Father   ? High blood pressure Father   ? Sleep apnea Father   ? Pancreatic cancer Father   ? Anemia Sister   ? Anemia Sister   ? Pancreatic cancer Maternal Uncle   ? Kidney disease Paternal Uncle   ? Arthritis Maternal Grandmother   ? Arthritis Maternal Grandfather   ? Arthritis Paternal Grandmother   ? Kidney disease Paternal Grandmother   ? Hypertension Paternal Grandmother   ? Arthritis Paternal Grandfather   ? Vision loss Paternal Grandfather   ? Stroke Paternal Grandfather   ? Hyperlipidemia Paternal Grandfather   ? Colon cancer Neg Hx   ? ? ?Outpatient Encounter Medications as of 04/18/2022  ?Medication Sig  ? Dulaglutide (TRULICITY) 1.5 MG/0.5ML SOPN Inject 1.5 mg into the skin once a week.  ? APPLE CIDER VINEGAR PO Take 1 tablet by mouth daily.  ? atorvastatin (LIPITOR) 10 MG tablet Take 1 tablet (10 mg total) by mouth daily.  ? Cholecalciferol (VITAMIN  D3) 25 MCG (1000 UT) CAPS Take 1 capsule (1,000 Units total) by mouth daily.  ? cyclobenzaprine (FLEXERIL) 10 MG tablet Take 1 tablet (10 mg total) by mouth 2 (two) times daily as needed for muscle spasms. (Patient taking differently: Take 10 mg by mouth as needed for muscle spasms.)  ? DiphenhydrAMINE HCl (BENADRYL ALLERGY PO) Take 1 tablet by mouth as needed (allergy).  ? docusate sodium (COLACE) 50 MG capsule Take 1 capsule (50 mg total) by mouth 2 (two) times daily. (Patient taking differently: Take 50 mg by mouth as needed.)  ? DULoxetine (CYMBALTA) 60 MG capsule Take 1 capsule (60 mg total) by mouth daily.  ? ELDERBERRY PO Take 1,150 mg by mouth daily.  ? ferrous sulfate (EQL SLOW RELEASE IRON) 160 (50 Fe) MG TBCR SR tablet Take 1 tablet (160 mg total) by mouth daily.  ? gabapentin (NEURONTIN) 100 MG capsule Take 1 capsule (100 mg total) by mouth 3 (three) times daily.  ? ibuprofen (ADVIL,MOTRIN) 200 MG tablet Take 200 mg by mouth as needed for headache, moderate pain or mild pain.  ? lisinopril (ZESTRIL) 20 MG tablet Take 1 tablet (20 mg total) by mouth daily.  ? meclizine (ANTIVERT) 25 MG tablet Take 1 tablet (25 mg total) by mouth 3 (three) times daily as needed for dizziness. (Patient taking differently: Take 25 mg by mouth as needed for dizziness.)  ? metFORMIN (GLUCOPHAGE) 500 MG tablet Take 1 tablet (500 mg total) by mouth 2 (two) times daily with a meal.  ? Multiple Vitamin (MULTIVITAMIN) capsule Take 1 capsule by mouth daily.  ? ocrelizumab (OCREVUS) 300 MG/10ML injection Inject 300 mg into the vein every 6 (six) months.  ? polyethylene glycol powder (GLYCOLAX/MIRALAX) 17 GM/SCOOP powder Take 1 capful (17 g) in water by mouth daily. (Patient taking differently: Take 17 g by mouth as needed.)  ? PRESCRIPTION MEDICATION Place 1 drop into both eyes at bedtime.  ? rizatriptan (MAXALT) 10 MG tablet Take 1 tablet (10 mg total) by mouth as needed for migraine. May repeat in 2 hours if needed. Max dose: 2/24  hr or 15/30 days.  ? topiramate (TOPAMAX) 100 MG tablet Take 1 tablet (100  mg total) by mouth at bedtime.  ? [DISCONTINUED] amoxicillin-clavulanate (AUGMENTIN) 875-125 MG tablet Take 1 tablet by mouth 2 (two) times daily.  ? [DISCONTINUED] insulin degludec (TRESIBA) 100 UNIT/ML FlexTouch Pen Inject 20 Units into the skin at bedtime.  ? ?No facility-administered encounter medications on file as of 04/18/2022.  ? ? ?ALLERGIES: ?No Known Allergies ? ?VACCINATION STATUS: ?Immunization History  ?Administered Date(s) Administered  ? Influenza Whole 10/24/2010  ? Influenza,inj,Quad PF,6+ Mos 09/01/2014, 03/14/2019, 09/17/2019, 12/02/2020, 01/27/2022  ? Moderna SARS-COV2 Booster Vaccination 11/16/2020  ? Moderna Sars-Covid-2 Vaccination 02/09/2020, 03/09/2020  ? Pneumococcal Polysaccharide-23 08/04/2014  ? Tdap 09/17/2019  ? ? ?Diabetes ?She presents for her follow-up diabetic visit. She has type 2 diabetes mellitus. Onset time: She was diagnosed at approximate age of 54 years. Her disease course has been improving. There are no hypoglycemic associated symptoms. Pertinent negatives for hypoglycemia include no confusion, headaches, pallor or seizures. Associated symptoms include fatigue. Pertinent negatives for diabetes include no chest pain, no polydipsia, no polyphagia and no polyuria. There are no hypoglycemic complications. Symptoms are improving (More recently she has shown near target glycemic profile on insulin treatment.  She presents with a meter showing average blood glucose of 123 mg per DL). There are no diabetic complications. Risk factors for coronary artery disease include diabetes mellitus, dyslipidemia, hypertension, obesity and sedentary lifestyle. Current diabetic treatment includes insulin injections (She is currently on Tresiba 20 units nightly, and metformin 500 mg p.o. twice daily.). Her weight is increasing steadily. She is following a generally unhealthy diet. When asked about meal planning, she  reported none. She has not had a previous visit with a dietitian. She rarely participates in exercise. Her home blood glucose trend is decreasing steadily. Her breakfast blood glucose range is generally 110-130

## 2022-04-18 NOTE — Patient Instructions (Signed)

## 2022-04-18 NOTE — Patient Instructions (Signed)
Goal ? ?Increase exercise to 150 minutes a week or more ?Use weights while outdoors ?Check out [email or web address]  ?Keep up the great job!!! ?

## 2022-04-20 ENCOUNTER — Encounter: Payer: Medicare Other | Attending: Physician Assistant | Admitting: Physician Assistant

## 2022-04-20 DIAGNOSIS — E11628 Type 2 diabetes mellitus with other skin complications: Secondary | ICD-10-CM | POA: Diagnosis not present

## 2022-04-20 DIAGNOSIS — I10 Essential (primary) hypertension: Secondary | ICD-10-CM | POA: Diagnosis not present

## 2022-04-20 DIAGNOSIS — Z87891 Personal history of nicotine dependence: Secondary | ICD-10-CM | POA: Insufficient documentation

## 2022-04-20 DIAGNOSIS — L988 Other specified disorders of the skin and subcutaneous tissue: Secondary | ICD-10-CM | POA: Diagnosis not present

## 2022-04-20 DIAGNOSIS — G35 Multiple sclerosis: Secondary | ICD-10-CM | POA: Diagnosis not present

## 2022-04-20 DIAGNOSIS — L02212 Cutaneous abscess of back [any part, except buttock]: Secondary | ICD-10-CM | POA: Diagnosis not present

## 2022-04-20 DIAGNOSIS — L0291 Cutaneous abscess, unspecified: Secondary | ICD-10-CM | POA: Diagnosis not present

## 2022-04-20 NOTE — Progress Notes (Signed)
DELYNDA, SEPULVEDA ([id number]) ?Visit Report for 04/20/2022 ?Abuse Risk Screen Details ?Patient Name: Katrina Montes, POLCYN. ?Date of Service: 04/20/2022 8:45 AM ?Medical Record Number: [id number] ?Patient Account Number: [id number] ?Date of Birth/Sex: 1971-12-13 (51 y.o. F) ?Treating RN: Huel Coventry ?Primary Care Shamus Desantis: Edwin Dada Other Clinician: ?Referring Telesha Deguzman: Edwin Dada ?Treating Dauntae Derusha/Extender: Allen Derry ?Weeks in Treatment: 0 ?Abuse Risk Screen Items ?Answer ?ABUSE RISK SCREEN: ?Has anyone close to you tried to hurt or harm you recentlyo No ?Do you feel uncomfortable with anyone in your familyo No ?Has anyone forced you do things that you didnot want to doo No ?Electronic Signature(s) ?Signed: 04/20/2022 5:29:17 PM By: Elliot Gurney, BSN, RN, CWS, Kim RN, BSN ?Entered By: Elliot Gurney, BSN, RN, CWS, Kim on 04/20/2022 09:01:54 ?BREAH, JOA ([id number]) ?-------------------------------------------------------------------------------- ?Activities of Daily Living Details ?Patient Name: Katrina Montes, Katrina Montes. ?Date of Service: 04/20/2022 8:45 AM ?Medical Record Number: [id number] ?Patient Account Number: [id number] ?Date of Birth/Sex: Aug 19, 1971 (51 y.o. F) ?Treating RN: Huel Coventry ?Primary Care Ethelbert Thain: Edwin Dada Other Clinician: ?Referring Denecia Brunette: Edwin Dada ?Treating Rubert Frediani/Extender: Allen Derry ?Weeks in Treatment: 0 ?Activities of Daily Living Items ?Answer ?Activities of Daily Living (Please select one for each item) ?Drive Automobile Completely Able ?Take Medications Completely Able ?Use Telephone Completely Able ?Care for Appearance Completely Able ?Use Toilet Completely Able ?Bath / Shower Completely Able ?Dress Self Completely Able ?Feed Self Completely Able ?Walk Completely Able ?Get In / Out Bed Completely Able ?Housework Completely Able ?Prepare Meals Completely Able ?Handle Money Completely Able ?Shop for Self Completely Able ?Electronic Signature(s) ?Signed: 04/20/2022 5:29:17 PM By:  Elliot Gurney, BSN, RN, CWS, Kim RN, BSN ?Entered By: Elliot Gurney, BSN, RN, CWS, Kim on 04/20/2022 09:02:05 ?DEEPTI, GUNAWAN ([id number]) ?-------------------------------------------------------------------------------- ?Education Screening Details ?Patient Name: Katrina Montes, Katrina Montes. ?Date of Service: 04/20/2022 8:45 AM ?Medical Record Number: [id number] ?Patient Account Number: [id number] ?Date of Birth/Sex: Mar 20, 1971 (51 y.o. F) ?Treating RN: Huel Coventry ?Primary Care Carol Theys: Edwin Dada Other Clinician: ?Referring Kadir Azucena: Edwin Dada ?Treating Amayah Staheli/Extender: Allen Derry ?Weeks in Treatment: 0 ?Primary Learner Assessed: Patient ?Learning Preferences/Education Level/Primary Language ?Learning Preference: Explanation, Demonstration ?Highest Education Level: College or Above ?Preferred Language: English ?Cognitive Barrier ?Language Barrier: No ?Translator Needed: No ?Memory Deficit: No ?Emotional Barrier: No ?Physical Barrier ?Impaired Vision: No ?Impaired Hearing: No ?Decreased Hand dexterity: No ?Knowledge/Comprehension ?Knowledge Level: High ?Comprehension Level: High ?Ability to understand written instructions: High ?Ability to understand verbal instructions: High ?Motivation ?Anxiety Level: Calm ?Cooperation: Cooperative ?Education Importance: Acknowledges Need ?Interest in Health Problems: Asks Questions ?Perception: Coherent ?Willingness to Engage in Self-Management ?High ?Activities: ?Readiness to Engage in Self-Management ?High ?Activities: ?Electronic Signature(s) ?Signed: 04/20/2022 5:29:17 PM By: Elliot Gurney, BSN, RN, CWS, Kim RN, BSN ?Entered By: Elliot Gurney, BSN, RN, CWS, Kim on 04/20/2022 09:02:25 ?ALEGRIA, DOMINIQUE ([id number]) ?-------------------------------------------------------------------------------- ?Fall Risk Assessment Details ?Patient Name: Katrina Montes, Katrina Montes. ?Date of Service: 04/20/2022 8:45 AM ?Medical Record Number: [id number] ?Patient Account Number: [id number] ?Date of Birth/Sex: 1971-09-24 (51 y.o.  F) ?Treating RN: Huel Coventry ?Primary Care Alexandru Moorer: Edwin Dada Other Clinician: ?Referring Damonte Frieson: Edwin Dada ?Treating Shanay Woolman/Extender: Allen Derry ?Weeks in Treatment: 0 ?Fall Risk Assessment Items ?Have you had 2 or more falls in the last 12 monthso 0 No ?Have you had any fall that resulted in injury in the last 12 monthso 0 No ?FALLS RISK SCREEN ?History of falling - immediate or within 3 months 0 No ?Secondary diagnosis (Do you have 2 or more medical diagnoseso) 0 No ?Ambulatory aid ?None/bed rest/wheelchair/nurse 0 Yes ?Crutches/cane/walker 0 No ?Furniture 0 No ?Intravenous  therapy Access/Saline/Heparin Lock 0 No ?Gait/Transferring ?Normal/ bed rest/ wheelchair 0 Yes ?Weak (short steps with or without shuffle, stooped but able to lift head while walking, may ?0 No ?seek support from furniture) ?Impaired (short steps with shuffle, may have difficulty arising from chair, head down, impaired ?0 No ?balance) ?Mental Status ?Oriented to own ability 0 Yes ?Electronic Signature(s) ?Signed: 04/20/2022 5:29:17 PM By: Elliot Gurney, BSN, RN, CWS, Kim RN, BSN ?Entered By: Elliot Gurney, BSN, RN, CWS, Kim on 04/20/2022 09:02:34 ?ANTOINETT, DORMAN ([id number]) ?-------------------------------------------------------------------------------- ?Foot Assessment Details ?Patient Name: Katrina Montes, Katrina Montes. ?Date of Service: 04/20/2022 8:45 AM ?Medical Record Number: [id number] ?Patient Account Number: [id number] ?Date of Birth/Sex: Dec 11, 1971 (51 y.o. F) ?Treating RN: Huel Coventry ?Primary Care Malic Rosten: Edwin Dada Other Clinician: ?Referring Aimar Shrewsbury: Edwin Dada ?Treating Shandy Checo/Extender: Allen Derry ?Weeks in Treatment: 0 ?Foot Assessment Items ?Site Locations ?+ = Sensation present, - = Sensation absent, C = Callus, U = Ulcer ?R = Redness, W = Warmth, M = Maceration, PU = Pre-ulcerative lesion ?F = Fissure, S = Swelling, D = Dryness ?Assessment ?Right: Left: ?Other Deformity: No No ?Prior Foot Ulcer: No No ?Prior  Amputation: No No ?Charcot Joint: No No ?Ambulatory Status: Ambulatory Without Help ?Gait: Steady ?Electronic Signature(s) ?Signed: 04/20/2022 5:29:17 PM By: Elliot Gurney, BSN, RN, CWS, Kim RN, BSN ?Entered By: Elliot Gurney, BSN, RN, CWS, Kim on 04/20/2022 09:03:18 ?DELFINA, SCHREURS ([id number]) ?-------------------------------------------------------------------------------- ?Nutrition Risk Screening Details ?Patient Name: Katrina Montes, Katrina Montes. ?Date of Service: 04/20/2022 8:45 AM ?Medical Record Number: [id number] ?Patient Account Number: [id number] ?Date of Birth/Sex: 1971-02-17 (51 y.o. F) ?Treating RN: Huel Coventry ?Primary Care Deeann Servidio: Edwin Dada Other Clinician: ?Referring Lerline Valdivia: Edwin Dada ?Treating Jezlyn Westerfield/Extender: Allen Derry ?Weeks in Treatment: 0 ?Height (in): 66 ?Weight (lbs): 259 ?Body Mass Index (BMI): 41.8 ?Nutrition Risk Screening Items ?Score Screening ?NUTRITION RISK SCREEN: ?I have an illness or condition that made me change the kind and/or amount of food I eat 0 No ?I eat fewer than two meals per day 0 No ?I eat few fruits and vegetables, or milk products 0 No ?I have three or more drinks of beer, liquor or wine almost every day 0 No ?I have tooth or mouth problems that make it hard for me to eat 0 No ?I don't always have enough money to buy the food I need 0 No ?I eat alone most of the time 0 No ?I take three or more different prescribed or over-the-counter drugs a day 1 Yes ?Without wanting to, I have lost or gained 10 pounds in the last six months 0 No ?I am not always physically able to shop, cook and/or feed myself 0 No ?Nutrition Protocols ?Good Risk Protocol 0 No interventions needed ?Moderate Risk Protocol ?High Risk Proctocol ?Risk Level: Good Risk ?Score: 1 ?Electronic Signature(s) ?Signed: 04/20/2022 5:29:17 PM By: Elliot Gurney, BSN, RN, CWS, Kim RN, BSN ?Entered By: Elliot Gurney, BSN, RN, CWS, Kim on 04/20/2022 09:03:08 ?

## 2022-04-20 NOTE — Progress Notes (Signed)
JAILYN, LEESON ([id number]) ?Visit Report for 04/20/2022 ?Allergy List Details ?Patient Name: Katrina Montes, Katrina Montes. ?Date of Service: 04/20/2022 8:45 AM ?Medical Record Number: [id number] ?Patient Account Number: [id number] ?Date of Birth/Sex: 10-Dec-1971 (51 y.o. F) ?Treating RN: Huel Coventry ?Primary Care Yasira Engelson: Edwin Dada Other Clinician: ?Referring Lorimer Tiberio: Edwin Dada ?Treating Karan Inclan/Extender: Allen Derry ?Weeks in Treatment: 0 ?Allergies ?Active Allergies ?No Known Drug Allergies ?Allergy Notes ?Electronic Signature(s) ?Signed: 04/20/2022 5:29:17 PM By: Elliot Gurney, BSN, RN, CWS, Kim RN, BSN ?Entered By: Elliot Gurney, BSN, RN, CWS, Kim on 04/20/2022 08:57:07 ?Katrina Montes, Katrina Montes ([id number]) ?-------------------------------------------------------------------------------- ?Arrival Information Details ?Patient Name: Katrina Montes, Katrina Montes. ?Date of Service: 04/20/2022 8:45 AM ?Medical Record Number: [id number] ?Patient Account Number: [id number] ?Date of Birth/Sex: [birth year] (51 y.o. F) ?Treating RN: Huel Coventry ?Primary Care Ocie Tino: Edwin Dada Other Clinician: ?Referring Gilmore List: Edwin Dada ?Treating Deaaron Fulghum/Extender: Allen Derry ?Weeks in Treatment: 0 ?Visit Information ?Patient Arrived: Ambulatory ?Arrival Time: 08:55 ?Accompanied By: self ?Transfer Assistance: None ?Patient Identification Verified: Yes ?Secondary Verification Process Completed: Yes ?Patient Has Alerts: Yes ?Patient Alerts: Type II Diabetic ?Electronic Signature(s) ?Signed: 04/20/2022 5:29:17 PM By: Elliot Gurney, BSN, RN, CWS, Kim RN, BSN ?Entered By: Elliot Gurney, BSN, RN, CWS, Kim on 04/20/2022 08:56:19 ?Katrina Montes, Katrina Montes ([id number]) ?-------------------------------------------------------------------------------- ?Clinic Level of Care Assessment Details ?Patient Name: Katrina Montes, Katrina Montes. ?Date of Service: 04/20/2022 8:45 AM ?Medical Record Number: [id number] ?Patient Account Number: [id number] ?Date of Birth/Sex: 08-Apr-1971 (51 y.o. F) ?Treating RN:  Huel Coventry ?Primary Care Kervin Bones: Edwin Dada Other Clinician: ?Referring Dquan Cortopassi: Edwin Dada ?Treating Ogle Hoeffner/Extender: Allen Derry ?Weeks in Treatment: 0 ?Clinic Level of Care Assessment Items ?TOOL 2 Quantity Score ?[]  - Use when only an EandM is performed on the INITIAL visit 0 ?ASSESSMENTS - Nursing Assessment / Reassessment ?X - General Physical Exam (combine w/ comprehensive assessment (listed just below) when performed on new ?1 20 ?pt. evals) ?X- 1 25 ?Comprehensive Assessment (HX, ROS, Risk Assessments, Wounds Hx, etc.) ?ASSESSMENTS - Wound and Skin Assessment / Reassessment ?X - Simple Wound Assessment / Reassessment - one wound 1 5 ?[]  - 0 ?Complex Wound Assessment / Reassessment - multiple wounds ?[]  - 0 ?Dermatologic / Skin Assessment (not related to wound area) ?ASSESSMENTS - Ostomy and/or Continence Assessment and Care ?[]  - Incontinence Assessment and Management 0 ?[]  - 0 ?Ostomy Care Assessment and Management (repouching, etc.) ?PROCESS - Coordination of Care ?X - Simple Patient / Family Education for ongoing care 1 15 ?[]  - 0 ?Complex (extensive) Patient / Family Education for ongoing care ?[]  - 0 ?Staff obtains Consents, Records, Test Results / Process Orders ?[]  - 0 ?Staff telephones HHA, Nursing Homes / Clarify orders / etc ?[]  - 0 ?Routine Transfer to another Facility (non-emergent condition) ?[]  - 0 ?Routine Hospital Admission (non-emergent condition) ?X- 1 15 ?New Admissions / / Ordering NPWT, Apligraf, etc. ?[]  - 0 ?Emergency Hospital Admission (emergent condition) ?X- 1 10 ?Simple Discharge Coordination ?[]  - 0 ?Complex (extensive) Discharge Coordination ?PROCESS - Special Needs ?[]  - Pediatric / Minor Patient Management 0 ?[]  - 0 ?Isolation Patient Management ?[]  - 0 ?Hearing / Language / Visual special needs ?[]  - 0 ?Assessment of Community assistance (transportation, D/C planning, etc.) ?[]  - 0 ?Additional assistance / Altered mentation ?[]  -  0 ?Support Surface(s) Assessment (bed, cushion, seat, etc.) ?INTERVENTIONS - Wound Cleansing / Measurement ?[]  - Wound Imaging (photographs - any number of wounds) 0 ?[]  - 0 ?Wound Tracing (instead of photographs) ?[]  - 0 ?Simple Wound Measurement - one wound ?[]  - 0 ?Complex Wound  Measurement - multiple wounds ?Katrina Montes, Katrina Montes ([id number]) ?[]  - 0 ?Simple Wound Cleansing - one wound ?[]  - 0 ?Complex Wound Cleansing - multiple wounds ?INTERVENTIONS - Wound Dressings ?[]  - Small Wound Dressing one or multiple wounds 0 ?[]  - 0 ?Medium Wound Dressing one or multiple wounds ?[]  - 0 ?Large Wound Dressing one or multiple wounds ?[]  - 0 ?Application of Medications - injection ?INTERVENTIONS - Miscellaneous ?[]  - External ear exam 0 ?[]  - 0 ?Specimen Collection (cultures, biopsies, blood, body fluids, etc.) ?[]  - 0 ?Specimen(s) / Culture(s) sent or taken to Lab for analysis ?[]  - 0 ?Patient Transfer (multiple staff / / Similar devices) ?[]  - 0 ?Simple Staple / Suture removal (25 or less) ?[]  - 0 ?Complex Staple / Suture removal (26 or more) ?[]  - 0 ?Hypo / Hyperglycemic Management (close monitor of Blood Glucose) ?[]  - 0 ?Ankle / Brachial Index (ABI) - do not check if billed separately ?Has the patient been seen at the hospital within the last three years: Yes ?Total Score: 90 ?Level Of Care: New/Established - Level ?3 ?Electronic Signature(s) ?Signed: 04/20/2022 5:29:17 PM By: , BSN, RN, CWS, Kim RN, BSN ?Entered By: , BSN, RN, CWS, Kim on 04/20/2022 09:20:11 ?Katrina Montes, Katrina Montes ( ) ?-------------------------------------------------------------------------------- ?Encounter Discharge Information Details ?Patient Name: Katrina Montes, Katrina Montes. ?Date of Service: 04/20/2022 8:45 AM ?Medical Record Number: ?Patient Account Number: Nurse, adult ?Date of Birth/Sex: 1971-06-27 (51 y.o. F) ?Treating RN: ?Primary Care Agnieszka Newhouse: Other Clinician: ?Referring Keyetta Hollingworth: 06/20/2022 ?Treating Kyzer Blowe/Extender: Elliot Gurney ?Weeks in Treatment: 0 ?Encounter Discharge Information Items ?Discharge Condition: Stable ?Ambulatory Status: Ambulatory ?Discharge Destination: Home ?Transportation: Private Auto ?Schedule Follow-up Appointment: Yes ?Clinical Summary of Care: ?Electronic Signature(s) ?Signed: 04/20/2022 5:29:17 PM By: 06/20/2022, BSN, RN, CWS, Kim RN, BSN ?Entered By: Alessandra Bevels, BSN, RN, CWS, Kim on 04/20/2022 09:21:43 ?Katrina Montes, Katrina Montes (06/20/2022) ?-------------------------------------------------------------------------------- ?Lower Extremity Assessment Details ?Patient Name: Katrina Montes, Katrina Montes. ?Date of Service: 04/20/2022 8:45 AM ?Medical Record Number: 10/18/1971 ?Patient Account Number: ([id number] ?Date of Birth/Sex: 12-11-1971 (51 y.o. F) ?Treating RN: Edwin Dada ?Primary Care Loyed Wilmes: Allen Derry Other Clinician: ?Referring Shivaun Bilello: 06/20/2022 ?Treating Korine Winton/Extender: Elliot Gurney ?Weeks in Treatment: 0 ?Electronic Signature(s) ?Signed: 04/20/2022 5:29:17 PM By: 06/20/2022, BSN, RN, CWS, Kim RN, BSN ?Entered By: Alessandra Bevels, BSN, RN, CWS, Kim on 04/20/2022 09:03:26 ?Katrina Montes, Katrina Montes (06/20/2022) ?-------------------------------------------------------------------------------- ?Multi Wound Chart Details ?Patient Name: Katrina Montes, Katrina Montes. ?Date of Service: 04/20/2022 8:45 AM ?Medical Record Number: 10/18/1971 ?Patient Account Number: ([id number] ?Date of Birth/Sex: 04-Aug-1971 (51 y.o. F) ?Treating RN: Edwin Dada ?Primary Care Jerime Arif: Allen Derry Other Clinician: ?Referring Damiean Lukes: 06/20/2022 ?Treating Margie Urbanowicz/Extender: Elliot Gurney ?Weeks in Treatment: 0 ?Vital Signs ?Height(in): 66 ?Pulse(bpm): 101 ?Weight(lbs): 259 ?Blood Pressure(mmHg): 150/85 ?Body Mass Index(BMI): 41.8 ?Temperature(??F): 98.1 ?Respiratory Rate(breaths/min): 16 ?Wound Assessments ?Treatment Notes ?Electronic Signature(s) ?Signed: 04/20/2022 5:29:17 PM By: 06/20/2022, BSN, RN, CWS, Kim RN, BSN ?Entered By:  Alessandra Bevels, BSN, RN, CWS, Kim on 04/20/2022 09:17:41 ?Katrina Montes, EALEY (06/20/2022) ?-------------------------------------------------------------------------------- ?Multi-Disciplinary Care Plan Details ?Patient N

## 2022-04-21 NOTE — Progress Notes (Signed)
Katrina Montes ([id number]) ?Visit Report for 04/20/2022 ?Chief Complaint Document Details ?Patient Name: Katrina Montes, Katrina Montes. ?Date of Service: 04/20/2022 8:45 AM ?Medical Record Number: [id number] ?Patient Account Number: [id number] ?Date of Birth/Sex: Apr 16, 1971 (51 y.o. F) ?Treating RN: Cornell Barman ?Primary Care Provider: Vena Rua Other Clinician: ?Referring Provider: Vena Rua ?Treating Provider/Extender: Jeri Cos ?Weeks in Treatment: 0 ?Information Obtained from: Patient ?Chief Complaint ?History of skin abscesses ?Electronic Signature(s) ?Signed: 04/20/2022 9:15:14 AM By: Worthy Keeler PA-C ?Entered By: Worthy Keeler on 04/20/2022 09:15:13 ?FELISITY, BOTKINS ([id number]) ?-------------------------------------------------------------------------------- ?HPI Details ?Patient Name: Katrina Montes. ?Date of Service: 04/20/2022 8:45 AM ?Medical Record Number: [id number] ?Patient Account Number: [id number] ?Date of Birth/Sex: Jun 22, 1971 (51 y.o. F) ?Treating RN: Cornell Barman ?Primary Care Provider: Vena Rua Other Clinician: ?Referring Provider: Vena Rua ?Treating Provider/Extender: Jeri Cos ?Weeks in Treatment: 0 ?History of Present Illness ?HPI Description: 04-20-2022 upon evaluation today patient presents for initial inspection here in our clinic concerning concerns that her primary ?care provider had with her getting an abscess on her back. She does have a more recent quite significant history. She was seen in the ER and ?essentially admitted to the hospital back in January 2023. She was in diabetic ketoacidosis and had a significant abscess as well. Subsequently ?she was treated and stabilized. Most recently she had an area of abscess that showed up in the past couple of weeks and again her primary care ?provider though they have been treating her with antibiotics and this does seem to be doing well was concerned about this getting worse and did ?not want to let it get out of  control. ?Patient does have a history of multiple sclerosis as well as diabetes mellitus type 2 which is now under excellent control with a hemoglobin A1c of ?6.4. ?Electronic Signature(s) ?Signed: 04/20/2022 5:34:42 PM By: Worthy Keeler PA-C ?Entered By: Worthy Keeler on 04/20/2022 17:34:42 ?TAREKA, MARLETTE ([id number]) ?-------------------------------------------------------------------------------- ?Physical Exam Details ?Patient Name: Katrina Montes. ?Date of Service: 04/20/2022 8:45 AM ?Medical Record Number: [id number] ?Patient Account Number: [id number] ?Date of Birth/Sex: [birth year] (51 y.o. F) ?Treating RN: Cornell Barman ?Primary Care Provider: Vena Rua Other Clinician: ?Referring Provider: Vena Rua ?Treating Provider/Extender: Jeri Cos ?Weeks in Treatment: 0 ?Constitutional ?patient is hypertensive.. pulse regular and within target range for patient.Marland Kitchen respirations regular, non-labored and within target range for patient.. ?temperature within target range for patient.. Well-nourished and well-hydrated in no acute distress. ?Eyes ?conjunctiva clear no eyelid edema noted. pupils equal round and reactive to light and accommodation. ?Ears, Nose, Mouth, and Throat ?no gross abnormality of ear auricles or external auditory canals. normal hearing noted during conversation. mucus membranes moist. ?Respiratory ?normal breathing without difficulty. ?Cardiovascular ?2+ dorsalis pedis/posterior tibialis pulses. no clubbing, cyanosis, significant edema, <3 sec cap refill. ?Musculoskeletal ?normal gait and posture. no significant deformity or arthritic changes, no loss or range of motion, no clubbing. ?Psychiatric ?this patient is able to make decisions and demonstrates good insight into disease process. Alert and Oriented x 3. pleasant and cooperative. ?Notes ?Upon inspection the area on the patient's back that she was concerned about actually appears to be doing quite well. I do not see any signs  of ?active infection locally or systemically and overall I do believe that she is really doing quite well with regard to this abscess location. There is some ?induration but again this does not appear to be anything actively infected and there is no fluid collection. ?Electronic Signature(s) ?Signed: 04/20/2022 5:35:41 PM By: Joaquim Lai  III, Margarita Grizzle PA-C ?Entered By: Worthy Keeler on 04/20/2022 17:35:41 ?KYEISHA, KOWIS ([id number]) ?-------------------------------------------------------------------------------- ?Physician Orders Details ?Patient Name: Katrina Montes. ?Date of Service: 04/20/2022 8:45 AM ?Medical Record Number: [id number] ?Patient Account Number: [id number] ?Date of Birth/Sex: 1971/09/16 (51 y.o. F) ?Treating RN: Cornell Barman ?Primary Care Provider: Vena Rua Other Clinician: ?Referring Provider: Vena Rua ?Treating Provider/Extender: Jeri Cos ?Weeks in Treatment: 0 ?Verbal / Phone Orders: No ?Diagnosis Coding ?ICD-10 Coding ?Code Description ?G35 Multiple sclerosis ?E11.628 Type 2 diabetes mellitus with other skin complications ?L98.8 Other specified disorders of the skin and subcutaneous tissue ?Discharge From [facility] ?o Consult Only - Patient may make an appointment in the future, if needed. ?Bathing/ Shower/ Hygiene ?o Other: - Wash once weekly with Hibaclense. ?Electronic Signature(s) ?Signed: 04/20/2022 5:29:17 PM By: Gretta Cool, BSN, RN, CWS, Kim RN, BSN ?Signed: 04/21/2022 4:41:50 PM By: Worthy Keeler PA-C ?Entered By: Gretta Cool, BSN, RN, CWS, Kim on 04/20/2022 09:19:49 ?WYNESHA, MELLETTE ([id number]) ?-------------------------------------------------------------------------------- ?Problem List Details ?Patient Name: Katrina Montes. ?Date of Service: 04/20/2022 8:45 AM ?Medical Record Number: [id number] ?Patient Account Number: [id number] ?Date of Birth/Sex: 1971/03/01 (51 y.o. F) ?Treating RN: Cornell Barman ?Primary Care Provider: Vena Rua Other Clinician: ?Referring  Provider: Vena Rua ?Treating Provider/Extender: Jeri Cos ?Weeks in Treatment: 0 ?Active Problems ?ICD-10 ?Encounter ?Code Description Active Date MDM ?Diagnosis ?G35 Multiple sclerosis 04/20/2022 No Yes ?E11.628 Type 2 diabetes mellitus with other skin complications 99991111 No Yes ?L98.8 Other specified disorders of the skin and subcutaneous tissue 04/20/2022 No Yes ?Inactive Problems ?Resolved Problems ?Electronic Signature(s) ?Signed: 04/20/2022 9:14:57 AM By: Worthy Keeler PA-C ?Entered By: Worthy Keeler on 04/20/2022 09:14:56 ?WAKEELAH, BRYNER ([id number]) ?-------------------------------------------------------------------------------- ?Progress Note Details ?Patient Name: MICAHLA, CABO. ?Date of Service: 04/20/2022 8:45 AM ?Medical Record Number: [id number] ?Patient Account Number: [id number] ?Date of Birth/Sex: 09-02-1971 (51 y.o. F) ?Treating RN: Cornell Barman ?Primary Care Provider: Vena Rua Other Clinician: ?Referring Provider: Vena Rua ?Treating Provider/Extender: Jeri Cos ?Weeks in Treatment: 0 ?Subjective ?Chief Complaint ?Information obtained from Patient ?History of skin abscesses ?History of Present Illness (HPI) ?04-20-2022 upon evaluation today patient presents for initial inspection here in our clinic concerning concerns that her primary care provider had ?with her getting an abscess on her back. She does have a more recent quite significant history. She was seen in the ER and essentially admitted ?to the hospital back in January 2023. She was in diabetic ketoacidosis and had a significant abscess as well. Subsequently she was treated and ?stabilized. Most recently she had an area of abscess that showed up in the past couple of weeks and again her primary care provider though they ?have been treating her with antibiotics and this does seem to be doing well was concerned about this getting worse and did not want to let it get ?out of control. ?Patient does have a history  of multiple sclerosis as well as diabetes mellitus type 2 which is now under excellent control with a hemoglobin A1c of ?6.4. ?Patient History ?Information obtained from Patient, Chart. ?Allergies ?No Known Drug

## 2022-04-24 ENCOUNTER — Encounter: Payer: Self-pay | Admitting: Gastroenterology

## 2022-04-24 ENCOUNTER — Ambulatory Visit: Payer: Medicare Other | Admitting: Gastroenterology

## 2022-04-24 VITALS — BP 120/70 | HR 88 | Temp 97.1°F | Ht 66.0 in | Wt 260.4 lb

## 2022-04-24 DIAGNOSIS — D508 Other iron deficiency anemias: Secondary | ICD-10-CM | POA: Diagnosis not present

## 2022-04-24 DIAGNOSIS — Z1211 Encounter for screening for malignant neoplasm of colon: Secondary | ICD-10-CM | POA: Diagnosis not present

## 2022-04-24 DIAGNOSIS — E11628 Type 2 diabetes mellitus with other skin complications: Secondary | ICD-10-CM | POA: Diagnosis not present

## 2022-04-24 NOTE — Progress Notes (Signed)
? ? ?GI Office Note   ? ?Referring Provider: Donell Beers, FNP ?Primary Care Physician:  Donell Beers, FNP  ?Primary GI: Dr. Jena Gauss ? ?Chief Complaint  ? ?Chief Complaint  ?Patient presents with  ? Colonoscopy  ?  Pt here for consult for colonoscopy  ? ? ? ?History of Present Illness  ? ?Katrina Montes is a 51 y.o. female presenting today at the request of Paseda, Folashade R, FNP for evaluation prior to scheduling colonoscopy.  ? ? ?Diabetes - Last Hgb A1c 6.4 (previous HgbA1c 12.4), currently on Tresiba nightly and metformin 500mg  BID. Currently seeing endocrinology since February.  ? ?Had a procedure in January, was not feeling well, had a boil on her buttocks. She had a systemic infection.  ? ?Today: ?Patient reports doing well.  She denies diarrhea, constipation, abdominal pain, nausea or vomiting, melena, hematochezia.  Denies any reflux symptoms or dysphagia.  Denies chest pain or shortness of breath. ? ?She does have MS and reports she had a flare about a month ago.  She follows every 55-month with her neurologist and received her Ocrevus infusion every 6 months as well. ? ?She has reported she has lost weight intentionally since being hospitalized in January with systemic infection from a boil on her butt caused by uncontrolled glucose.  She is working on controlling her blood glucose by working with a nutritionist and following other lifestyle modifications.  ? ? ?Past Medical History:  ?Diagnosis Date  ? Anemia   ? Anxiety   ? Back pain   ? Carpal tunnel syndrome   ? Constipation   ? Depression   ? Diabetes mellitus   ? Diabetes mellitus, type II (HCC)   ? Hyperlipidemia   ? Hypertension   ? Joint pain   ? Lactose intolerance   ? Multiple sclerosis (HCC)   ? Optic neuritis   ? Shortness of breath   ? Swallowing difficulty   ? Swelling of both lower extremities   ? Vision, loss, sudden   ? Vitamin D deficiency   ? ? ?Past Surgical History:  ?Procedure Laterality Date  ? BREAST REDUCTION  SURGERY    ? CYST EXCISION    ? INCISION AND DRAINAGE ABSCESS Left 12/23/2021  ? Procedure: INCISION AND DRAINAGE BUTTOCK ABSCESS;  Surgeon: 02/20/2022, MD;  Location: [facility] OR;  Service: General;  Laterality: Left;  ? ovarian cyst removed    ? TUBAL LIGATION    ? WISDOM TOOTH EXTRACTION    ? ? ?Current Outpatient Medications  ?Medication Sig Dispense Refill  ? APPLE CIDER VINEGAR PO Take 1 tablet by mouth daily.    ? atorvastatin (LIPITOR) 10 MG tablet Take 1 tablet (10 mg total) by mouth daily. 90 tablet 3  ? Cholecalciferol (VITAMIN D3) 25 MCG (1000 UT) CAPS Take 1 capsule (1,000 Units total) by mouth daily. 30 capsule 3  ? cyclobenzaprine (FLEXERIL) 10 MG tablet Take 1 tablet (10 mg total) by mouth 2 (two) times daily as needed for muscle spasms. (Patient taking differently: Take 10 mg by mouth as needed for muscle spasms.) 30 tablet 3  ? DiphenhydrAMINE HCl (BENADRYL ALLERGY PO) Take 1 tablet by mouth as needed (allergy).    ? docusate sodium (COLACE) 50 MG capsule Take 1 capsule (50 mg total) by mouth 2 (two) times daily. (Patient taking differently: Take 50 mg by mouth as needed.) 60 capsule 11  ? Dulaglutide (TRULICITY) 1.5 MG/0.5ML SOPN Inject 1.5 mg into the skin once a week.  2 mL 2  ? DULoxetine (CYMBALTA) 60 MG capsule Take 1 capsule (60 mg total) by mouth daily. 90 capsule 3  ? ELDERBERRY PO Take 1,150 mg by mouth daily.    ? ferrous sulfate (EQL SLOW RELEASE IRON) 160 (50 Fe) MG TBCR SR tablet Take 1 tablet (160 mg total) by mouth daily. 30 tablet 3  ? gabapentin (NEURONTIN) 100 MG capsule Take 1 capsule (100 mg total) by mouth 3 (three) times daily. 90 capsule 3  ? ibuprofen (ADVIL,MOTRIN) 200 MG tablet Take 200 mg by mouth as needed for headache, moderate pain or mild pain.    ? lisinopril (ZESTRIL) 20 MG tablet Take 1 tablet (20 mg total) by mouth daily. 30 tablet 11  ? meclizine (ANTIVERT) 25 MG tablet Take 1 tablet (25 mg total) by mouth 3 (three) times daily as needed for dizziness. (Patient taking  differently: Take 25 mg by mouth as needed for dizziness.) 30 tablet 6  ? metFORMIN (GLUCOPHAGE) 500 MG tablet Take 1 tablet (500 mg total) by mouth 2 (two) times daily with a meal. 60 tablet 11  ? Multiple Vitamin (MULTIVITAMIN) capsule Take 1 capsule by mouth daily.    ? ocrelizumab (OCREVUS) 300 MG/10ML injection Inject 300 mg into the vein every 6 (six) months.    ? polyethylene glycol powder (GLYCOLAX/MIRALAX) 17 GM/SCOOP powder Take 1 capful (17 g) in water by mouth daily. (Patient taking differently: Take 17 g by mouth as needed.) 238 g 0  ? PRESCRIPTION MEDICATION Place 1 drop into both eyes at bedtime.    ? rizatriptan (MAXALT) 10 MG tablet Take 1 tablet (10 mg total) by mouth as needed for migraine. May repeat in 2 hours if needed. Max dose: 2/24 hr or 15/30 days. 45 tablet 3  ? topiramate (TOPAMAX) 100 MG tablet Take 1 tablet (100 mg total) by mouth at bedtime. 90 tablet 4  ? ?No current facility-administered medications for this visit.  ? ? ?Allergies as of 04/24/2022  ? (No Known Allergies)  ? ? ?Family History  ?Problem Relation Age of Onset  ? Cancer Mother   ? Diabetes Mother   ? Hearing loss Mother   ? Thyroid nodules Mother   ? Anemia Mother   ? Kidney disease Mother   ? Hypertension Mother   ? Obesity Mother   ? Breast cancer Mother   ? Hyperlipidemia Father   ? High blood pressure Father   ? Sleep apnea Father   ? Pancreatic cancer Father   ? Anemia Sister   ? Anemia Sister   ? Pancreatic cancer Maternal Uncle   ? Kidney disease Paternal Uncle   ? Arthritis Maternal Grandmother   ? Arthritis Maternal Grandfather   ? Arthritis Paternal Grandmother   ? Kidney disease Paternal Grandmother   ? Hypertension Paternal Grandmother   ? Arthritis Paternal Grandfather   ? Vision loss Paternal Grandfather   ? Stroke Paternal Grandfather   ? Hyperlipidemia Paternal Grandfather   ? Colon cancer Neg Hx   ? ? ?Social History  ? ?Socioeconomic History  ? Marital status: Single  ?  Spouse name: Not on file  ?  Number of children: 0  ? Years of education: College  ? Highest education level: Not on file  ?Occupational History  ? 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 Smoking status: Never  ? Smokeless tobacco: Never  ?Vaping Use  ? Vaping Use: Never used  ?Substance and Sexual Activity  ? Alcohol use: No  ?  Drug use: No  ? Sexual activity: Not Currently  ?Other Topics Concern  ? Not on file  ?Social History Narrative  ? Live at home with parents.  ? Right handed.  ? ?Social Determinants of Health  ? ?Financial Resource Strain: Low Risk   ? Difficulty of Paying Living Expenses: Not very hard  ?Food Insecurity: No Food Insecurity  ? Worried About Programme researcher, broadcasting/film/video in the Last Year: Never true  ? Ran Out of Food in the Last Year: Never true  ?Transportation Needs: No Transportation Needs  ? Lack of Transportation (Medical): No  ? Lack of Transportation (Non-Medical): No  ?Physical Activity: Insufficiently Active  ? Days of Exercise per Week: 3 days  ? Minutes of Exercise per Session: 30 min  ?Stress: No Stress Concern Present  ? Feeling of Stress : Not at all  ?Social Connections: Moderately Integrated  ? Frequency of Communication with Friends and Family: Twice a week  ? Frequency of Social Gatherings with Friends and Family: Twice a week  ? Attends Religious Services: 1 to 4 times per year  ? Active Member of Clubs or Organizations: Yes  ? Attends Banker Meetings: Never  ? Marital Status: Never married  ?Intimate Partner Violence: Not At Risk  ? Fear of Current or Ex-Partner: No  ? Emotionally Abused: No  ? Physically Abused: No  ? Sexually Abused: No  ? ? ? ?Review of Systems  ? ?Gen: Denies any fever, chills, fatigue, weight loss, lack of appetite.  ?CV: Denies chest pain, heart palpitations, peripheral edema, syncope.  ?Resp: Denies shortness of breath at rest or with exertion. Denies wheezing or cough.  ?GI: see HPI ?GU : Denies urinary burning, urinary frequency, urinary hesitancy ?MS: Denies  joint pain, muscle weakness, cramps, or limitation of movement.  ?Derm: Denies rash, itching, dry skin ?Psych: Denies depression, anxiety, memory loss, and confusion ?Heme: Denies bruising, bleeding, and en

## 2022-04-24 NOTE — Patient Instructions (Signed)
It was a pleasure to meet you today! ? ?We are scheduling you for a colonoscopy in the near future with Dr. Jena Gauss. ? ?If you choose please do within 5 days of your procedure please wait until after your procedure to avoid hypoglycemia prior to your procedure.  ? ?You need to hold your iron for 10 days prior to procedure. ? ?You will need to hold your metformin the night prior in the morning of the procedure. ? ?It was a pleasure to see you today. I want to create trusting relationships with patients. If you receive a survey regarding your visit,  I greatly appreciate you taking time to fill this out on paper or through your MyChart. I value your feedback. ? ?Brooke Bonito, MSN, FNP-BC, AGACNP-BC ?[facility] ? ? ?

## 2022-04-28 ENCOUNTER — Telehealth: Payer: Self-pay | Admitting: *Deleted

## 2022-04-28 ENCOUNTER — Encounter: Payer: Self-pay | Admitting: *Deleted

## 2022-04-28 MED ORDER — PEG 3350-KCL-NA BICARB-NACL 420 G PO SOLR: ORAL | 0 refills | Status: DC

## 2022-04-28 NOTE — Telephone Encounter (Signed)
Spoke with pt. She has been scheduled for TCS with ASA 3 on 6/16. Aware will mail prep instructions and pre-op appt. Aware to hold iron 10 days and trulicity 5 days of procedure.  ? ? ?PA done and approved via [facility]. Auth# [id number], DOS: Jun 02, 2022 - Aug 31, 2022 ?

## 2022-05-09 ENCOUNTER — Ambulatory Visit (INDEPENDENT_AMBULATORY_CARE_PROVIDER_SITE_OTHER): Payer: Medicare Other | Admitting: Nurse Practitioner

## 2022-05-09 ENCOUNTER — Encounter: Payer: Self-pay | Admitting: Nurse Practitioner

## 2022-05-09 VITALS — BP 117/88 | HR 85 | Ht 66.0 in | Wt 259.0 lb

## 2022-05-09 DIAGNOSIS — E782 Mixed hyperlipidemia: Secondary | ICD-10-CM

## 2022-05-09 DIAGNOSIS — L98429 Non-pressure chronic ulcer of back with unspecified severity: Secondary | ICD-10-CM

## 2022-05-09 DIAGNOSIS — I10 Essential (primary) hypertension: Secondary | ICD-10-CM

## 2022-05-09 DIAGNOSIS — E11622 Type 2 diabetes mellitus with other skin ulcer: Secondary | ICD-10-CM

## 2022-05-09 DIAGNOSIS — E1165 Type 2 diabetes mellitus with hyperglycemia: Secondary | ICD-10-CM | POA: Diagnosis not present

## 2022-05-09 DIAGNOSIS — F330 Major depressive disorder, recurrent, mild: Secondary | ICD-10-CM

## 2022-05-09 NOTE — Assessment & Plan Note (Signed)
Not on medication, condition well-controlled PHQ-9 score 0 Denies SI, HI

## 2022-05-09 NOTE — Assessment & Plan Note (Signed)
BP Readings from Last 3 Encounters:  05/09/22 117/88  04/24/22 120/70  04/18/22 134/88  Chronic condition well-controlled on lisinopril 20 mg daily DASH diet advised, engage in regular moderate exercises at least 150 minutes weekly Continue current medication

## 2022-05-09 NOTE — Patient Instructions (Signed)

## 2022-05-09 NOTE — Assessment & Plan Note (Signed)
Ulcer now completely healed A1c well controlled

## 2022-05-09 NOTE — Assessment & Plan Note (Signed)
Lab Results  Component Value Date   HGBA1C 6.4 04/18/2022  chronic condition well controlled  On trulicity 1.5mg  once weekly injection , metformin 500mg  BID  Appreciates collaboration with endo.  Avoid sugar sweets soda.

## 2022-05-09 NOTE — Progress Notes (Signed)
   Katrina Montes     MRN: [id number]      DOB: [birth year]   HPI Ms. Katrina Montes with past medical history of essential hypertension, migraines, uncontrolled type 2 diabetes, hyperlipidemia, is here for follow up for diabetic ulcer .  Patient states that her wound has completely healed up, denies fever, chills, nausea, vomiting.   Type 2 diabetes .  Patient stated that she has started taking trulicity 1.5mg  once weekluy injection, states that she is tolerating medication well. still taking metformin 500 mg twice daily.  She has been following up with endocrinology.    Has upcoming colonscopy in June 16.   ROS Denies recent fever or chills. Denies sinus pressure, nasal congestion, ear pain or sore throat. Denies chest congestion, productive cough or wheezing. Denies chest pains, palpitations and leg swelling Denies abdominal pain, nausea, vomiting,diarrhea or constipation.   Denies dysuria, frequency, hesitancy or incontinence. Denies joint pain, swelling and limitation in mobility. Denies skin break down or rash.   PE  BP 117/88 (BP Location: Left Arm, Patient Position: Sitting, Cuff Size: Large)   Pulse 85   Ht 5\' 6"  (1.676 m)   Wt 259 lb (117.5 kg)   LMP 04/25/2022 (Approximate)   SpO2 98%   BMI 41.80 kg/m   Patient alert and oriented and in no cardiopulmonary distress.    Chest: Clear to auscultation bilaterally.  CVS: S1, S2 no murmurs, no S3.Regular rate.  ABD: Soft non tender.   Ext: No edema  MS: Adequate ROM spine, shoulders, hips and knees.  Skin: Intact, no ulcerations or rash noted.  Psych: Good eye contact, normal affect. Memory intact not anxious or depressed appearing.    Assessment & Plan  Essential hypertension, benign BP Readings from Last 3 Encounters:  05/09/22 117/88  04/24/22 120/70  04/18/22 134/88  Chronic condition well-controlled on lisinopril 20 mg daily DASH diet advised, engage in regular moderate exercises at least 150  minutes weekly Continue current medication  Diabetic ulcer of back (HCC) Ulcer now completely healed A1c well controlled  Mixed hyperlipidemia Lab Results  Component Value Date   CHOL 169 02/13/2022   HDL 67 02/13/2022   LDLCALC 89 02/13/2022   TRIG 69 02/13/2022   CHOLHDL 2.5 02/13/2022  Currently on atorvastatin 10 mg daily, LDL goal is less than 70 Check lipid panel today  Uncontrolled type 2 diabetes mellitus with hyperglycemia (HCC) Lab Results  Component Value Date   HGBA1C 6.4 04/18/2022  chronic condition well controlled  On trulicity 1.5mg  once weekly injection , metformin 500mg  BID  Appreciates collaboration with endo.  Avoid sugar sweets soda.  Mild episode of recurrent major depressive disorder (HCC) Not on medication, condition well-controlled PHQ-9 score 0 Denies SI, HI

## 2022-05-09 NOTE — Assessment & Plan Note (Signed)
Lab Results  Component Value Date   CHOL 169 02/13/2022   HDL 67 02/13/2022   LDLCALC 89 02/13/2022   TRIG 69 02/13/2022   CHOLHDL 2.5 02/13/2022  Currently on atorvastatin 10 mg daily, LDL goal is less than 70 Check lipid panel today

## 2022-05-10 ENCOUNTER — Other Ambulatory Visit: Payer: Self-pay | Admitting: Nurse Practitioner

## 2022-05-10 LAB — CMP14+EGFR
ALT: 12 IU/L (ref 0–32)
AST: 12 IU/L (ref 0–40)
Albumin/Globulin Ratio: 1.3 (ref 1.2–2.2)
Albumin: 4.2 g/dL (ref 3.8–4.8)
Alkaline Phosphatase: 86 IU/L (ref 44–121)
BUN/Creatinine Ratio: 16 (ref 9–23)
BUN: 10 mg/dL (ref 6–24)
Bilirubin Total: 0.5 mg/dL (ref 0.0–1.2)
CO2: 20 mmol/L (ref 20–29)
Calcium: 9.3 mg/dL (ref 8.7–10.2)
Chloride: 106 mmol/L (ref 96–106)
Creatinine, Ser: 0.64 mg/dL (ref 0.57–1.00)
Globulin, Total: 3.2 g/dL (ref 1.5–4.5)
Glucose: 112 mg/dL — ABNORMAL HIGH (ref 70–99)
Potassium: 4.6 mmol/L (ref 3.5–5.2)
Sodium: 139 mmol/L (ref 134–144)
Total Protein: 7.4 g/dL (ref 6.0–8.5)
eGFR: 108 mL/min/{1.73_m2} (ref >59)

## 2022-05-10 LAB — LIPID PANEL
Chol/HDL Ratio: 2.2 ratio (ref 0.0–4.4)
Cholesterol, Total: 177 mg/dL (ref 100–199)
HDL: 79 mg/dL (ref >39)
LDL Chol Calc (NIH): 85 mg/dL (ref 0–99)
Triglycerides: 69 mg/dL (ref 0–149)
VLDL Cholesterol Cal: 13 mg/dL (ref 5–40)

## 2022-05-10 MED ORDER — ATORVASTATIN CALCIUM 20 MG PO TABS
20.0000 mg | ORAL_TABLET | Freq: Every day | ORAL | 3 refills | Status: DC
Start: 2022-05-10 — End: 2022-09-13

## 2022-05-10 NOTE — Progress Notes (Signed)
  LDL not at goal of less than 70, start atorvastatin 20mg  daily. Avoid fried fatty foods   The 10-year ASCVD risk score (Arnett DK, et al., 2019) is: 5.3%   Values used to calculate the score:     Age: 51 years     Sex: Female     Is Non-Hispanic African American: Yes     Diabetic: Yes     Tobacco smoker: Yes     Systolic Blood Pressure: 117 mmHg     Is BP treated: Yes     HDL Cholesterol: 79 mg/dL     Total Cholesterol: 177 mg/dL

## 2022-05-29 NOTE — Patient Instructions (Signed)
Katrina Montes  05/29/2022     @PREFPERIOPPHARMACY @   Your procedure is scheduled on  06/02/2022.   Report to 06/04/2022 at  0715  A.M.   Call this number if you have problems the morning of surgery:  [phone]   Remember:  Follow the diet and prep instructions given to you by the office.    Take these medicines the morning of surgery with A SIP OF WATER      flexeril(if needed), cymbalta, gabapentin, antivert(if needed), maxalt(if needed).     Do not wear jewelry, make-up or nail polish.  Do not wear lotions, powders, or perfumes, or deodorant.  Do not shave 48 hours prior to surgery.  Men may shave face and neck.  Do not bring valuables to the hospital.  [facility] is not responsible for any belongings or valuables.  Contacts, dentures or bridgework may not be worn into surgery.  Leave your suitcase in the car.  After surgery it may be brought to your room.  For patients admitted to the hospital, discharge time will be determined by your treatment team.  Patients discharged the day of surgery will not be allowed to drive home and must have someone with them for 24 hours.    Special instructions:   DO NOT smoke tobacco or vape for 24 hours before your procedure.   Please read over the following fact sheets that you were given. Anesthesia Post-op Instructions and Care and Recovery After Surgery      Colonoscopy, Adult, Care After The following information offers guidance on how to care for yourself after your procedure. Your health care provider may also give you more specific instructions. If you have problems or questions, contact your health care provider. What can I expect after the procedure? After the procedure, it is common to have: A small amount of blood in your stool for 24 hours after the procedure. Some gas. Mild cramping or bloating of your abdomen. Follow these instructions at home: Eating and drinking  Drink enough fluid to keep  your urine pale yellow. Follow instructions from your health care provider about eating or drinking restrictions. Resume your normal diet as told by your health care provider. Avoid heavy or fried foods that are hard to digest. Activity Rest as told by your health care provider. Avoid sitting for a long time without moving. Get up to take short walks every 1-2 hours. This is important to improve blood flow and breathing. Ask for help if you feel weak or unsteady. Return to your normal activities as told by your health care provider. Ask your health care provider what activities are safe for you. Managing cramping and bloating  Try walking around when you have cramps or feel bloated. If directed, apply heat to your abdomen as told by your health care provider. Use the heat source that your health care provider recommends, such as a moist heat pack or a heating pad. Place a towel between your skin and the heat source. Leave the heat on for 20-30 minutes. Remove the heat if your skin turns bright red. This is especially important if you are unable to feel pain, heat, or cold. You have a greater risk of getting burned. General instructions If you were given a sedative during the procedure, it can affect you for several hours. Do not drive or operate machinery until your health care provider says that it is safe. For the first 24 hours after the  procedure: Do not sign important documents. Do not drink alcohol. Do your regular daily activities at a slower pace than normal. Eat soft foods that are easy to digest. Take over-the-counter and prescription medicines only as told by your health care provider. Keep all follow-up visits. This is important. Contact a health care provider if: You have blood in your stool 2-3 days after the procedure. Get help right away if: You have more than a small spotting of blood in your stool. You have large blood clots in your stool. You have swelling of your  abdomen. You have nausea or vomiting. You have a fever. You have increasing pain in your abdomen that is not relieved with medicine. These symptoms may be an emergency. Get help right away. Call 911. Do not wait to see if the symptoms will go away. Do not drive yourself to the hospital. Summary After the procedure, it is common to have a small amount of blood in your stool. You may also have mild cramping and bloating of your abdomen. If you were given a sedative during the procedure, it can affect you for several hours. Do not drive or operate machinery until your health care provider says that it is safe. Get help right away if you have a lot of blood in your stool, nausea or vomiting, a fever, or increased pain in your abdomen. This information is not intended to replace advice given to you by your health care provider. Make sure you discuss any questions you have with your health care provider. Document Revised: 07/27/2021 Document Reviewed: 07/27/2021 Elsevier Patient Education  2023 [employer] Monitored Anesthesia Care, Care After This sheet gives you information about how to care for yourself after your procedure. Your health care provider may also give you more specific instructions. If you have problems or questions, contact your health care provider. What can I expect after the procedure? After the procedure, it is common to have: Tiredness. Forgetfulness about what happened after the procedure. Impaired judgment for important decisions. Nausea or vomiting. Some difficulty with balance. Follow these instructions at home: For the time period you were told by your health care provider:     Rest as needed. Do not participate in activities where you could fall or become injured. Do not drive or use machinery. Do not drink alcohol. Do not take sleeping pills or medicines that cause drowsiness. Do not make important decisions or sign legal documents. Do not take care of  children on your own. Eating and drinking Follow the diet that is recommended by your health care provider. Drink enough fluid to keep your urine pale yellow. If you vomit: Drink water, juice, or soup when you can drink without vomiting. Make sure you have little or no nausea before eating solid foods. General instructions Have a responsible adult stay with you for the time you are told. It is important to have someone help care for you until you are awake and alert. Take over-the-counter and prescription medicines only as told by your health care provider. If you have sleep apnea, surgery and certain medicines can increase your risk for breathing problems. Follow instructions from your health care provider about wearing your sleep device: Anytime you are sleeping, including during daytime naps. While taking prescription pain medicines, sleeping medicines, or medicines that make you drowsy. Avoid smoking. Keep all follow-up visits as told by your health care provider. This is important. Contact a health care provider if: You keep feeling nauseous or you keep vomiting.  You feel light-headed. You are still sleepy or having trouble with balance after 24 hours. You develop a rash. You have a fever. You have redness or swelling around the IV site. Get help right away if: You have trouble breathing. You have new-onset confusion at home. Summary For several hours after your procedure, you may feel tired. You may also be forgetful and have poor judgment. Have a responsible adult stay with you for the time you are told. It is important to have someone help care for you until you are awake and alert. Rest as told. Do not drive or operate machinery. Do not drink alcohol or take sleeping pills. Get help right away if you have trouble breathing, or if you suddenly become confused. This information is not intended to replace advice given to you by your health care provider. Make sure you discuss any  questions you have with your health care provider. Document Revised: 11/08/2021 Document Reviewed: 11/06/2019 Elsevier Patient Education  2023 [employer].

## 2022-05-30 ENCOUNTER — Other Ambulatory Visit: Payer: Self-pay | Admitting: Internal Medicine

## 2022-05-30 ENCOUNTER — Encounter: Payer: Self-pay | Admitting: Nutrition

## 2022-05-30 ENCOUNTER — Telehealth: Payer: Self-pay | Admitting: Neurology

## 2022-05-30 NOTE — Telephone Encounter (Signed)
LVM and sent mychart msg informing pt of r/s needed for 8/17 appt- Sarah out. 

## 2022-05-31 ENCOUNTER — Encounter (HOSPITAL_COMMUNITY)
Admission: RE | Admit: 2022-05-31 | Discharge: 2022-05-31 | Disposition: A | Discharge status: Home / Self Care | Payer: Medicare Other | Source: Ambulatory Visit | Attending: Internal Medicine | Admitting: Internal Medicine

## 2022-05-31 ENCOUNTER — Encounter (HOSPITAL_COMMUNITY): Payer: Self-pay

## 2022-05-31 VITALS — BP 122/76 | HR 90 | Temp 97.8°F | Resp 18 | Ht 66.0 in | Wt 259.0 lb

## 2022-05-31 DIAGNOSIS — Z01818 Encounter for other preprocedural examination: Secondary | ICD-10-CM

## 2022-05-31 DIAGNOSIS — Z01812 Encounter for preprocedural laboratory examination: Secondary | ICD-10-CM | POA: Diagnosis not present

## 2022-05-31 HISTORY — DX: Sleep apnea, unspecified: G47.30

## 2022-05-31 LAB — PREGNANCY, URINE: Preg Test, Ur: NEGATIVE

## 2022-06-02 ENCOUNTER — Ambulatory Visit (HOSPITAL_COMMUNITY)
Admission: RE | Admit: 2022-06-02 | Discharge: 2022-06-02 | Disposition: A | Discharge status: Home / Self Care | Payer: Medicare Other | Attending: Internal Medicine | Admitting: Internal Medicine

## 2022-06-02 ENCOUNTER — Ambulatory Visit (HOSPITAL_COMMUNITY): Payer: Medicare Other | Admitting: Certified Registered"

## 2022-06-02 ENCOUNTER — Encounter (HOSPITAL_COMMUNITY): Payer: Self-pay | Admitting: Internal Medicine

## 2022-06-02 ENCOUNTER — Encounter (HOSPITAL_COMMUNITY)
Admission: RE | Disposition: A | Discharge status: Home / Self Care | Payer: Self-pay | Source: Home / Self Care | Attending: Internal Medicine

## 2022-06-02 ENCOUNTER — Ambulatory Visit (HOSPITAL_BASED_OUTPATIENT_CLINIC_OR_DEPARTMENT_OTHER): Payer: Medicare Other | Admitting: Certified Registered"

## 2022-06-02 DIAGNOSIS — K5730 Diverticulosis of large intestine without perforation or abscess without bleeding: Secondary | ICD-10-CM | POA: Insufficient documentation

## 2022-06-02 DIAGNOSIS — Z7985 Long-term (current) use of injectable non-insulin antidiabetic drugs: Secondary | ICD-10-CM | POA: Diagnosis not present

## 2022-06-02 DIAGNOSIS — Z7984 Long term (current) use of oral hypoglycemic drugs: Secondary | ICD-10-CM | POA: Diagnosis not present

## 2022-06-02 DIAGNOSIS — Z6841 Body Mass Index (BMI) 40.0 and over, adult: Secondary | ICD-10-CM | POA: Insufficient documentation

## 2022-06-02 DIAGNOSIS — D649 Anemia, unspecified: Secondary | ICD-10-CM | POA: Insufficient documentation

## 2022-06-02 DIAGNOSIS — I10 Essential (primary) hypertension: Secondary | ICD-10-CM | POA: Insufficient documentation

## 2022-06-02 DIAGNOSIS — F32A Depression, unspecified: Secondary | ICD-10-CM | POA: Insufficient documentation

## 2022-06-02 DIAGNOSIS — Z1211 Encounter for screening for malignant neoplasm of colon: Secondary | ICD-10-CM | POA: Diagnosis not present

## 2022-06-02 DIAGNOSIS — R519 Headache, unspecified: Secondary | ICD-10-CM | POA: Diagnosis not present

## 2022-06-02 DIAGNOSIS — E119 Type 2 diabetes mellitus without complications: Secondary | ICD-10-CM | POA: Insufficient documentation

## 2022-06-02 DIAGNOSIS — Q438 Other specified congenital malformations of intestine: Secondary | ICD-10-CM | POA: Insufficient documentation

## 2022-06-02 DIAGNOSIS — K640 First degree hemorrhoids: Secondary | ICD-10-CM

## 2022-06-02 DIAGNOSIS — F419 Anxiety disorder, unspecified: Secondary | ICD-10-CM | POA: Insufficient documentation

## 2022-06-02 DIAGNOSIS — G4730 Sleep apnea, unspecified: Secondary | ICD-10-CM | POA: Insufficient documentation

## 2022-06-02 HISTORY — PX: COLONOSCOPY WITH PROPOFOL: SHX5780

## 2022-06-02 LAB — GLUCOSE, CAPILLARY: Glucose-Capillary: 168 mg/dL — ABNORMAL HIGH (ref 70–99)

## 2022-06-02 SURGERY — COLONOSCOPY WITH PROPOFOL
Anesthesia: General

## 2022-06-02 MED ORDER — PROPOFOL 10 MG/ML IV BOLUS
INTRAVENOUS | Status: DC | PRN
  Administered 2022-06-02: 100 mg via INTRAVENOUS
  Administered 2022-06-02: 50 mg via INTRAVENOUS

## 2022-06-02 MED ORDER — LIDOCAINE HCL (CARDIAC) PF 100 MG/5ML IV SOSY
PREFILLED_SYRINGE | INTRAVENOUS | Status: DC | PRN
  Administered 2022-06-02: 50 mg via INTRAVENOUS

## 2022-06-02 MED ORDER — PHENYLEPHRINE HCL (PRESSORS) 10 MG/ML IV SOLN
INTRAVENOUS | Status: DC | PRN
  Administered 2022-06-02 (×2): 100 ug via INTRAVENOUS

## 2022-06-02 MED ORDER — LACTATED RINGERS IV SOLN: INTRAVENOUS | Status: DC | PRN

## 2022-06-02 MED ORDER — LACTATED RINGERS IV SOLN: INTRAVENOUS | Status: DC

## 2022-06-02 MED ORDER — PROPOFOL 500 MG/50ML IV EMUL
INTRAVENOUS | Status: DC | PRN
  Administered 2022-06-02: 150 ug/kg/min via INTRAVENOUS

## 2022-06-02 NOTE — Anesthesia Preprocedure Evaluation (Signed)
Anesthesia Evaluation  Patient identified by MRN, date of birth, ID band Patient awake    Reviewed: Allergy & Precautions, H&P , NPO status , Patient's Chart, lab work & pertinent test results, reviewed documented beta blocker date and time   Airway Mallampati: II  TM Distance: >3 FB Neck ROM: full    Dental no notable dental hx.    Pulmonary shortness of breath, sleep apnea ,    Pulmonary exam normal breath sounds clear to auscultation       Cardiovascular Exercise Tolerance: Good hypertension, negative cardio ROS   Rhythm:regular Rate:Normal     Neuro/Psych  Headaches, PSYCHIATRIC DISORDERS Anxiety Depression  Neuromuscular disease    GI/Hepatic negative GI ROS, Neg liver ROS,   Endo/Other  diabetes, Type obesity  Renal/GU negative Renal ROS  negative genitourinary   Musculoskeletal   Abdominal   Peds  Hematology  (+) Blood dyscrasia, anemia ,   Anesthesia Other Findings   Reproductive/Obstetrics negative OB ROS                             Anesthesia Physical Anesthesia Plan  ASA: 3  Anesthesia Plan: General   Post-op Pain Management:    Induction:   PONV Risk Score and Plan: Propofol infusion  Airway Management Planned:   Additional Equipment:   Intra-op Plan:   Post-operative Plan:   Informed Consent: I have reviewed the patients History and Physical, chart, labs and discussed the procedure including the risks, benefits and alternatives for the proposed anesthesia with the patient or authorized representative who has indicated his/her understanding and acceptance.     Dental Advisory Given  Plan Discussed with: CRNA  Anesthesia Plan Comments:         Anesthesia Quick Evaluation

## 2022-06-02 NOTE — Discharge Instructions (Addendum)
  Colonoscopy Discharge Instructions  Read the instructions outlined below and refer to this sheet in the next few weeks. These discharge instructions provide you with general information on caring for yourself after you leave the hospital. Your doctor may also give you specific instructions. While your treatment has been planned according to the most current medical practices available, unavoidable complications occasionally occur. If you have any problems or questions after discharge, call Dr. Jena Gauss at [phone]. ACTIVITY You may resume your regular activity, but move at a slower pace for the next 24 hours.  Take frequent rest periods for the next 24 hours.  Walking will help get rid of the air and reduce the bloated feeling in your belly (abdomen).  No driving for 24 hours (because of the medicine (anesthesia) used during the test).   Do not sign any important legal documents or operate any machinery for 24 hours (because of the anesthesia used during the test).  NUTRITION Drink plenty of fluids.  You may resume your normal diet as instructed by your doctor.  Begin with a light meal and progress to your normal diet. Heavy or fried foods are harder to digest and may make you feel sick to your stomach (nauseated).  Avoid alcoholic beverages for 24 hours or as instructed.  MEDICATIONS You may resume your normal medications unless your doctor tells you otherwise.  WHAT YOU CAN EXPECT TODAY Some feelings of bloating in the abdomen.  Passage of more gas than usual.  Spotting of blood in your stool or on the toilet paper.  IF YOU HAD POLYPS REMOVED DURING THE COLONOSCOPY: No aspirin products for 7 days or as instructed.  No alcohol for 7 days or as instructed.  Eat a soft diet for the next 24 hours.  FINDING OUT THE RESULTS OF YOUR TEST Not all test results are available during your visit. If your test results are not back during the visit, make an appointment with your caregiver to find out the  results. Do not assume everything is normal if you have not heard from your caregiver or the medical facility. It is important for you to follow up on all of your test results.  SEEK IMMEDIATE MEDICAL ATTENTION IF: You have more than a spotting of blood in your stool.  Your belly is swollen (abdominal distention).  You are nauseated or vomiting.  You have a temperature over 101.  You have abdominal pain or discomfort that is severe or gets worse throughout the day.     No polyps found today  Diverticulosis information provided  I recommend a repeat colonoscopy for screening purposes in 10 years  At patient request, called Laveta Gilkey at [phone] rolled to voicemail-left a message

## 2022-06-02 NOTE — Anesthesia Procedure Notes (Signed)
Date/Time: 06/02/2022 8:56 AM  Performed by: Lorin Glass, CRNAPre-anesthesia Checklist: Patient identified, Emergency Drugs available, Suction available and Patient being monitored Patient Re-evaluated:Patient Re-evaluated prior to induction Oxygen Delivery Method: Nasal cannula Induction Type: IV induction

## 2022-06-02 NOTE — Anesthesia Postprocedure Evaluation (Signed)
Anesthesia Post Note  Patient: Katrina Montes  Procedure(s) Performed: COLONOSCOPY WITH PROPOFOL  Patient location during evaluation: Phase II Anesthesia Type: General Level of consciousness: awake Pain management: pain level controlled Vital Signs Assessment: post-procedure vital signs reviewed and stable Respiratory status: spontaneous breathing and respiratory function stable Cardiovascular status: blood pressure returned to baseline and stable Postop Assessment: no headache and no apparent nausea or vomiting Anesthetic complications: no Comments: Late entry   No notable events documented.   Last Vitals:  Vitals:   06/02/22 0748 06/02/22 0931  BP: 123/81 108/75  Pulse: 85 83  Resp: (!) 23 (!) 21  Temp: 37 C 36.8 C  SpO2: 96% 100%    Last Pain:  Vitals:   06/02/22 0931  TempSrc: Oral  PainSc: 0-No pain                 Windell Norfolk

## 2022-06-02 NOTE — Transfer of Care (Signed)
Immediate Anesthesia Transfer of Care Note  Patient: Katrina Montes  Procedure(s) Performed: COLONOSCOPY WITH PROPOFOL  Patient Location: Short Stay  Anesthesia Type:General  Level of Consciousness: awake and oriented  Airway & Oxygen Therapy: Patient Spontanous Breathing  Post-op Assessment: Report given to RN and Post -op Vital signs reviewed and stable  Post vital signs: Reviewed and stable  Last Vitals:  Vitals Value Taken Time  BP    Temp    Pulse    Resp    SpO2      Last Pain:  Vitals:   06/02/22 0853  TempSrc:   PainSc: 0-No pain         Complications: No notable events documented.

## 2022-06-02 NOTE — H&P (Signed)
@LOGO @   Primary Care Physician:  , FNP Primary Gastroenterologist:  Dr. Donell Beers  Pre-Procedure History & Physical: HPI:  Katrina Montes is a 51 y.o. female is here for a screening colonoscopy.  No bowel problems.  No family history colon cancer.  No prior colonoscopy. Past Medical History:  Diagnosis Date   Anemia    Anxiety    Back pain    Carpal tunnel syndrome    Constipation    Depression    Diabetes mellitus    Diabetes mellitus, type II (HCC)    Hyperlipidemia    Hypertension    Joint pain    Lactose intolerance    Multiple sclerosis (HCC) 2016   Optic neuritis    Shortness of breath    Sleep apnea    Swallowing difficulty    Swelling of both lower extremities    Vision, loss, sudden    Vitamin D deficiency     Past Surgical History:  Procedure Laterality Date   BREAST REDUCTION SURGERY     CYST EXCISION     INCISION AND DRAINAGE ABSCESS Left 12/23/2021   Procedure: INCISION AND DRAINAGE BUTTOCK ABSCESS;  Surgeon: 02/20/2022, MD;  Location: [facility] OR;  Service: General;  Laterality: Left;   ovarian cyst removed     TUBAL LIGATION     WISDOM TOOTH EXTRACTION      Prior to Admission medications   Medication Sig Start Date End Date Taking? Authorizing Provider  APPLE CIDER VINEGAR PO Take 1 tablet by mouth daily.   Yes [provider]  atorvastatin (LIPITOR) 20 MG tablet Take 1 tablet (20 mg total) by mouth daily. 05/10/22  Yes Paseda, 05/12/22, FNP  Cholecalciferol (VITAMIN D3) 25 MCG (1000 UT) CAPS Take 1 capsule (1,000 Units total) by mouth daily. 01/28/22  Yes Paseda, 03/28/22, FNP  cyclobenzaprine (FLEXERIL) 10 MG tablet Take 1 tablet (10 mg total) by mouth 2 (two) times daily as needed for muscle spasms. 02/02/22  Yes 02/04/22, NP  diphenhydrAMINE (BENADRYL) 25 MG tablet Take 25 mg by mouth every 6 (six) hours as needed for allergies.   Yes [provider]  docusate sodium (COLACE) 50 MG capsule Take 1  capsule (50 mg total) by mouth 2 (two) times daily. Patient taking differently: Take 50 mg by mouth daily as needed for moderate constipation. 03/09/15  Yes 03/11/15, MD  Dulaglutide (TRULICITY) 1.5 MG/0.5ML SOPN Inject 1.5 mg into the skin once a week. 04/18/22  Yes 06/18/22, MD  DULoxetine (CYMBALTA) 60 MG capsule Take 1 capsule (60 mg total) by mouth daily. 02/02/22  Yes 02/04/22, NP  ELDERBERRY PO Take 1,150 mg by mouth daily.   Yes [provider]  ferrous sulfate (EQL SLOW RELEASE IRON) 160 (50 Fe) MG TBCR SR tablet Take 1 tablet (160 mg total) by mouth daily. 01/27/22  Yes Paseda, 03/27/22, FNP  gabapentin (NEURONTIN) 100 MG capsule Take 1 capsule (100 mg total) by mouth 3 (three) times daily. 01/27/22  Yes Paseda, 03/27/22, FNP  ibuprofen (ADVIL,MOTRIN) 200 MG tablet Take 200 mg by mouth as needed for headache, moderate pain or mild pain.   Yes [provider]  lisinopril (ZESTRIL) 20 MG tablet Take 1 tablet (20 mg total) by mouth daily. 01/27/22  Yes Paseda, 03/27/22, FNP  meclizine (ANTIVERT) 25 MG tablet Take 1 tablet (25 mg total) by mouth 3 (three) times daily as needed for dizziness. 07/19/20  Yes  Glean Salvo, NP  metFORMIN (GLUCOPHAGE) 500 MG tablet Take 1 tablet (500 mg total) by mouth 2 (two) times daily with a meal. 01/27/22 01/27/23 Yes Paseda, Baird Kay, FNP  Multiple Vitamin (MULTIVITAMIN) capsule Take 1 capsule by mouth daily.   Yes [provider]  ocrelizumab (OCREVUS) 300 MG/10ML injection Inject 300 mg into the vein every 6 (six) months.   Yes [provider]  polyethylene glycol powder (GLYCOLAX/MIRALAX) 17 GM/SCOOP powder Take 1 capful (17 g) in water by mouth daily. Patient taking differently: Take 17 g by mouth daily as needed for moderate constipation. 12/30/21  Yes Ghimire, Werner Lean, MD  polyethylene glycol-electrolytes (NULYTELY) 420 g solution As directed 04/28/22  Yes Keturah Yerby, Gerrit Friends, MD  rizatriptan  (MAXALT) 10 MG tablet Take 1 tablet (10 mg total) by mouth as needed for migraine. May repeat in 2 hours if needed. Max dose: 2/24 hr or 15/30 days. 08/01/21  Yes Levert Feinstein, MD  topiramate (TOPAMAX) 100 MG tablet Take 1 tablet (100 mg total) by mouth at bedtime. 08/01/21  Yes Levert Feinstein, MD  PRESCRIPTION MEDICATION Place 1 drop into both eyes at bedtime.    [provider]    Allergies as of 04/28/2022   (No Known Allergies)    Family History  Problem Relation Age of Onset   Cancer Mother    Diabetes Mother    Hearing loss Mother    Thyroid nodules Mother    Anemia Mother    Kidney disease Mother    Hypertension Mother    Obesity Mother    Breast cancer Mother    Hyperlipidemia Father    High blood pressure Father    Sleep apnea Father    Pancreatic cancer Father    Anemia Sister    Anemia Sister    Arthritis Maternal Grandmother    Arthritis Maternal Grandfather    Arthritis Paternal Grandmother    Kidney disease Paternal Grandmother    Hypertension Paternal Grandmother    Arthritis Paternal Grandfather    Vision loss Paternal Grandfather    Stroke Paternal Grandfather    Hyperlipidemia Paternal Grandfather    Pancreatic cancer Maternal Uncle    Kidney disease Paternal Uncle    Colon cancer Neg Hx    Colon polyps Neg Hx     Social History   Socioeconomic History   Marital status: Single    Spouse name: Not on file   Number of children: 0   Years of education: College   Highest education level: Not on file  Occupational History   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   Right handed.   Social Determinants of Health   Financial Resource Strain: Low Risk  (02/21/2022)    Overall Financial Resource Strain (CARDIA)    Difficulty of Paying Living Expenses: Not very hard  Food Insecurity: No Food Insecurity (02/21/2022)   Hunger Vital Sign    Worried About Running Out of Food in the Last Year: Never true    Ran Out of Food in the Last Year: Never true  Transportation Needs: No Transportation Needs (02/21/2022)   PRAPARE - Transportation  Lack of Transportation (Medical): No    Lack of Transportation (Non-Medical): No  Physical Activity: Insufficiently Active (02/21/2022)   Exercise Vital Sign    Days of Exercise per Week: 3 days    Minutes of Exercise per Session: 30 min  Stress: No Stress Concern Present (02/21/2022)   [employer] of Occupational Health - Occupational Stress Questionnaire    Feeling of Stress : Not at all  Social Connections: Moderately Integrated (02/21/2022)   Social Connection and Isolation Panel [NHANES]    Frequency of Communication with Friends and Family: Twice a week    Frequency of Social Gatherings with Friends and Family: Twice a week    Attends Religious Services: 1 to 4 times per year    Active Member of [employer] or Organizations: Yes    Attends Banker Meetings: Never    Marital Status: Never married  Intimate Partner Violence: Not At Risk (02/21/2022)   Humiliation, Afraid, Rape, and Kick questionnaire    Fear of Current or Ex-Partner: No    Emotionally Abused: No    Physically Abused: No    Sexually Abused: No    Review of Systems: See HPI, otherwise negative ROS  Physical Exam: BP 123/81   Pulse 85   Temp 98.6 F (37 C) (Oral)   Resp (!) 23   Ht 5\' 6"  (1.676 m)   Wt 117.5 kg   LMP 05/12/2022   SpO2 96%   BMI 41.81 kg/m  General:   Alert,  Well-developed, well-nourished, pleasant and cooperative in NAD Lungs:  Clear throughout to auscultation.   No wheezes, crackles, or rhonchi. No acute distress. Heart:  Regular rate and rhythm; no murmurs, clicks, rubs,  or gallops. Abdomen:  Soft, nontender and  nondistended. No masses, hepatosplenomegaly or hernias noted. Normal bowel sounds, without guarding, and without rebound.   Impression/Plan: Vollie Aerie Donica is now here to undergo a screening colonoscopy. First ever average risk screening examination.  Risks, benefits, limitations, imponderables and alternatives regarding colonoscopy have been reviewed with the patient. Questions have been answered. All parties agreeable.     Notice:  This dictation was prepared with Dragon dictation along with smaller phrase technology. Any transcriptional errors that result from this process are unintentional and may not be corrected upon review.

## 2022-06-02 NOTE — Op Note (Signed)
[facility] Patient Name: Katrina Montes Procedure Date: 06/02/2022 8:45 AM MRN: [id number] Date of Birth: 1971-08-23 Attending MD: Gennette Pac , MD CSN: [id number] Age: 51 Admit Type: Outpatient Procedure:                Colonoscopy Indications:              Screening for colorectal malignant neoplasm Providers:                Gennette Pac, MD, Buel Ream. Thomasena Edis RN, RN,                            Pandora Leiter, Technician Referring MD:              Medicines:                Propofol per Anesthesia Complications:            No immediate complications. Estimated Blood Loss:     Estimated blood loss: none. Procedure:                Pre-Anesthesia Assessment:                           - Prior to the procedure, a History and Physical                            was performed, and patient medications and                            allergies were reviewed. The patient's tolerance of                            previous anesthesia was also reviewed. The risks                            and benefits of the procedure and the sedation                            options and risks were discussed with the patient.                            All questions were answered, and informed consent                            was obtained. Prior Anticoagulants: The patient has                            taken no previous anticoagulant or antiplatelet                            agents. ASA Grade Assessment: III - A patient with                            severe systemic disease. After reviewing the risks  and benefits, the patient was deemed in                            satisfactory condition to undergo the procedure.                           After obtaining informed consent, the colonoscope                            was passed under direct vision. Throughout the                            procedure, the patient's blood pressure, pulse, and                             oxygen saturations were monitored continuously. The                            [id number]) scope was introduced through the                            anus and advanced to the the cecum, identified by                            appendiceal orifice and ileocecal valve. The                            colonoscopy was performed without difficulty. The                            patient tolerated the procedure well. The quality                            of the bowel preparation was adequate. The                            ileocecal valve, appendiceal orifice, and rectum                            were photographed. The entire colon was well                            visualized. Scope In: 8:57:59 AM Scope Out: 9:23:59 AM Scope Withdrawal Time: 0 hours 7 minutes 39 seconds  Total Procedure Duration: 0 hours 26 minutes 0 seconds  Findings:      The perianal and digital rectal examinations were normal. Redundant and       elongated colon. External abdominal pressure and changing of the       patient's position required to reach the cecum.      Scattered small-mouthed diverticula were found in the entire colon.      Non-bleeding internal hemorrhoids were found during retroflexion. The       hemorrhoids were moderate, medium-sized and Grade I (internal       hemorrhoids that do not prolapse).  The exam was otherwise without abnormality on direct and retroflexion       views. Impression:               - Diverticulosis in the entire examined colon.                           - Non-bleeding internal hemorrhoids. Redundant and                            elongated colon.                           - The examination was otherwise normal on direct                            and retroflexion views.                           - No specimens collected. Moderate Sedation:      Moderate (conscious) sedation was personally administered by an       anesthesia professional. The following  parameters were monitored: oxygen       saturation, heart rate, blood pressure, respiratory rate, EKG, adequacy       of pulmonary ventilation, and response to care. Recommendation:           - Patient has a contact number available for                            emergencies. The signs and symptoms of potential                            delayed complications were discussed with the                            patient. Return to normal activities tomorrow.                            Written discharge instructions were provided to the                            patient.                           - Resume previous diet.                           - Continue present medications.                           - Repeat colonoscopy in 10 years for screening                            purposes.                           - Return to GI office PRN. Procedure Code(s):        --- Professional ---  99371, Colonoscopy, flexible; diagnostic, including                            collection of specimen(s) by brushing or washing,                            when performed (separate procedure) Diagnosis Code(s):        --- Professional ---                           Z12.11, Encounter for screening for malignant                            neoplasm of colon                           K64.0, First degree hemorrhoids                           K57.30, Diverticulosis of large intestine without                            perforation or abscess without bleeding CPT copyright 2019 [employer]. All rights reserved. The codes documented in this report are preliminary and upon coder review may  be revised to meet current compliance requirements. Gerrit Friends. Jonee Lamore, MD Gennette Pac, MD 06/02/2022 9:32:11 AM This report has been signed electronically. Number of Addenda: 0

## 2022-06-08 ENCOUNTER — Encounter (HOSPITAL_COMMUNITY): Payer: Self-pay | Admitting: Internal Medicine

## 2022-07-03 ENCOUNTER — Ambulatory Visit
Admission: EM | Admit: 2022-07-03 | Discharge: 2022-07-03 | Disposition: A | Discharge status: Home / Self Care | Payer: Medicare Other | Attending: Nurse Practitioner | Admitting: Nurse Practitioner

## 2022-07-03 ENCOUNTER — Ambulatory Visit (INDEPENDENT_AMBULATORY_CARE_PROVIDER_SITE_OTHER): Payer: Medicare Other

## 2022-07-03 DIAGNOSIS — S92352A Displaced fracture of fifth metatarsal bone, left foot, initial encounter for closed fracture: Secondary | ICD-10-CM | POA: Diagnosis not present

## 2022-07-03 DIAGNOSIS — M79672 Pain in left foot: Secondary | ICD-10-CM

## 2022-07-03 MED ORDER — OXYCODONE-ACETAMINOPHEN 5-325 MG PO TABS: 1.0000 | ORAL_TABLET | Freq: Two times a day (BID) | ORAL | 0 refills | Status: DC

## 2022-07-03 NOTE — ED Triage Notes (Signed)
Pt had fall 2 weeks ago going down steps and developed swelling and increased pain today

## 2022-07-03 NOTE — ED Provider Notes (Signed)
[facility]    CSN: [id number] Arrival date & time: 07/03/22  1904      History   Chief Complaint Chief Complaint  Patient presents with   Foot Injury    HPI Katrina Montes is a 51 y.o. female.   The history is provided by the patient.   Patient presents for complaints of left foot pain after she fell down the steps approximately 2 weeks ago.  She states today when she woke up, she had swelling and increased pain.  She denies swelling previously to the left foot.  She also complains of pain in the left lower leg.  She states that the pain radiates from the left lower leg into the left foot.  She states that she has been taking over-the-counter analgesics for her symptoms.  She denies previous injury or trauma to the left foot.  Past Medical History:  Diagnosis Date   Anemia    Anxiety    Back pain    Carpal tunnel syndrome    Constipation    Depression    Diabetes mellitus    Diabetes mellitus, type II (HCC)    Hyperlipidemia    Hypertension    Joint pain    Lactose intolerance    Multiple sclerosis (HCC) 2016   Optic neuritis    Shortness of breath    Sleep apnea    Swallowing difficulty    Swelling of both lower extremities    Vision, loss, sudden    Vitamin D deficiency     Patient Active Problem List   Diagnosis Date Noted   Mild episode of recurrent major depressive disorder (HCC) 05/09/2022   Annual physical exam 03/28/2022   Diabetic ulcer of back (HCC) 03/28/2022   Mixed hyperlipidemia 02/13/2022   Uncontrolled type 2 diabetes mellitus with hyperglycemia (HCC) 01/27/2022   Screening for colon cancer 01/27/2022   Need for immunization against influenza 01/27/2022   Neuropathy 01/27/2022   Prolonged QT interval 12/23/2021   Emphysematous cystitis 12/23/2021   Hypokalemia 12/23/2021   Vaginal candidiasis 12/23/2021   Left buttock abscess 12/22/2021   Chronic migraine w/o aura w/o status migrainosus, not intractable 08/01/2021    Fatigue 08/01/2021   Diabetic autonomic neuropathy associated with type 2 diabetes mellitus (HCC) 08/01/2021   Other fatigue 01/19/2020   Bilateral hearing loss 01/19/2020   Carpal tunnel syndrome of right wrist 09/17/2019   OSA (obstructive sleep apnea) 06/04/2019   Tommi Rumps Quervain's disease (radial styloid tenosynovitis) 11/06/2018   Gait abnormality 04/04/2018   Depression 01/29/2017   Migraine without aura and without status migrainosus, not intractable 03/30/2015   Dizziness, nonspecific 03/30/2015   Left lumbar radiculopathy 03/09/2015   Right optic neuritis 01/14/2015   Multiple sclerosis (HCC) 01/14/2015   OVARIAN CYST 02/09/2011   Morbid obesity (HCC) 10/24/2010   Poorly controlled type 2 diabetes mellitus (HCC) 09/22/2010   Vitamin D deficiency 09/22/2010   ANEMIA-IRON DEFICIENCY 09/22/2010   Essential hypertension, benign 09/22/2010    Past Surgical History:  Procedure Laterality Date   BREAST REDUCTION SURGERY     COLONOSCOPY WITH PROPOFOL N/A 06/02/2022   Procedure: COLONOSCOPY WITH PROPOFOL;  Surgeon: Corbin Ade, MD;  Location: [facility];  Service: Endoscopy;  Laterality: N/A;  8:45AM   CYST EXCISION     INCISION AND DRAINAGE ABSCESS Left 12/23/2021   Procedure: INCISION AND DRAINAGE BUTTOCK ABSCESS;  Surgeon: Almond Lint, MD;  Location: [facility] OR;  Service: General;  Laterality: Left;   ovarian cyst removed  TUBAL LIGATION     WISDOM TOOTH EXTRACTION      OB History     Gravida  0   Para  0   Term  0   Preterm  0   AB  0   Living  0      SAB  0   IAB  0   Ectopic  0   Multiple  0   Live Births  0            Home Medications    Prior to Admission medications   Medication Sig Start Date End Date Taking? Authorizing Provider  oxyCODONE-acetaminophen (PERCOCET/ROXICET) 5-325 MG tablet Take 1 tablet by mouth every 12 (twelve) hours. 07/03/22  Yes Makaley Storts-Warren, Sadie Haber, NP  APPLE CIDER VINEGAR PO Take 1 tablet by mouth daily.     [provider]  atorvastatin (LIPITOR) 20 MG tablet Take 1 tablet (20 mg total) by mouth daily. 05/10/22   Donell Beers, FNP  Cholecalciferol (VITAMIN D3) 25 MCG (1000 UT) CAPS Take 1 capsule (1,000 Units total) by mouth daily. 01/28/22   Paseda, Baird Kay, FNP  cyclobenzaprine (FLEXERIL) 10 MG tablet Take 1 tablet (10 mg total) by mouth 2 (two) times daily as needed for muscle spasms. 02/02/22   Glean Salvo, NP  diphenhydrAMINE (BENADRYL) 25 MG tablet Take 25 mg by mouth every 6 (six) hours as needed for allergies.    [provider]  docusate sodium (COLACE) 50 MG capsule Take 1 capsule (50 mg total) by mouth 2 (two) times daily. Patient taking differently: Take 50 mg by mouth daily as needed for moderate constipation. 03/09/15   Levert Feinstein, MD  Dulaglutide (TRULICITY) 1.5 MG/0.5ML SOPN Inject 1.5 mg into the skin once a week. 04/18/22   Roma Kayser, MD  DULoxetine (CYMBALTA) 60 MG capsule Take 1 capsule (60 mg total) by mouth daily. 02/02/22   Glean Salvo, NP  ELDERBERRY PO Take 1,150 mg by mouth daily.    [provider]  ferrous sulfate (EQL SLOW RELEASE IRON) 160 (50 Fe) MG TBCR SR tablet Take 1 tablet (160 mg total) by mouth daily. 01/27/22   Paseda, Baird Kay, FNP  gabapentin (NEURONTIN) 100 MG capsule Take 1 capsule (100 mg total) by mouth 3 (three) times daily. 01/27/22   Paseda, Baird Kay, FNP  ibuprofen (ADVIL,MOTRIN) 200 MG tablet Take 200 mg by mouth as needed for headache, moderate pain or mild pain.    [provider]  lisinopril (ZESTRIL) 20 MG tablet Take 1 tablet (20 mg total) by mouth daily. 01/27/22   Donell Beers, FNP  meclizine (ANTIVERT) 25 MG tablet Take 1 tablet (25 mg total) by mouth 3 (three) times daily as needed for dizziness. 07/19/20   Glean Salvo, NP  metFORMIN (GLUCOPHAGE) 500 MG tablet Take 1 tablet (500 mg total) by mouth 2 (two) times daily with a meal. 01/27/22 01/27/23  Paseda, Baird Kay, FNP   Multiple Vitamin (MULTIVITAMIN) capsule Take 1 capsule by mouth daily.    [provider]  ocrelizumab (OCREVUS) 300 MG/10ML injection Inject 300 mg into the vein every 6 (six) months.    [provider]  polyethylene glycol powder (GLYCOLAX/MIRALAX) 17 GM/SCOOP powder Take 1 capful (17 g) in water by mouth daily. Patient taking differently: Take 17 g by mouth daily as needed for moderate constipation. 12/30/21   Ghimire, Werner Lean, MD  polyethylene glycol-electrolytes (NULYTELY) 420 g solution As directed 04/28/22   Rourk,  Gerrit Friends, MD  PRESCRIPTION MEDICATION Place 1 drop into both eyes at bedtime.    [provider]  rizatriptan (MAXALT) 10 MG tablet Take 1 tablet (10 mg total) by mouth as needed for migraine. May repeat in 2 hours if needed. Max dose: 2/24 hr or 15/30 days. 08/01/21   Levert Feinstein, MD  topiramate (TOPAMAX) 100 MG tablet Take 1 tablet (100 mg total) by mouth at bedtime. 08/01/21   Levert Feinstein, MD    Family History Family History  Problem Relation Age of Onset   Cancer Mother    Diabetes Mother    Hearing loss Mother    Thyroid nodules Mother    Anemia Mother    Kidney disease Mother    Hypertension Mother    Obesity Mother    Breast cancer Mother    Hyperlipidemia Father    High blood pressure Father    Sleep apnea Father    Pancreatic cancer Father    Anemia Sister    Anemia Sister    Arthritis Maternal Grandmother    Arthritis Maternal Grandfather    Arthritis Paternal Grandmother    Kidney disease Paternal Grandmother    Hypertension Paternal Grandmother    Arthritis Paternal Grandfather    Vision loss Paternal Grandfather    Stroke Paternal Grandfather    Hyperlipidemia Paternal Grandfather    Pancreatic cancer Maternal Uncle    Kidney disease Paternal Uncle    Colon cancer Neg Hx    Colon polyps Neg Hx     Social History Social History   Tobacco Use   Smoking status: Never   Smokeless tobacco: Never  Vaping Use   Vaping  Use: Never used  Substance Use Topics   Alcohol use: No   Drug use: No     Allergies   Patient has no known allergies.   Review of Systems Review of Systems Per HPI  Physical Exam Triage Vital Signs ED Triage Vitals  Enc Vitals Group     BP 07/03/22 1949 139/88     Pulse Rate 07/03/22 1949 71     Resp 07/03/22 1949 18     Temp 07/03/22 1949 98 F (36.7 C)     Temp src --      SpO2 07/03/22 1949 99 %     Weight --      Height --      Head Circumference --      Peak Flow --      Pain Score 07/03/22 1947 6     Pain Loc --      Pain Edu? --      Excl. in GC? --    No data found.  Updated Vital Signs BP 139/88   Pulse 71   Temp 98 F (36.7 C)   Resp 18   LMP 06/10/2022   SpO2 99%   Visual Acuity Right Eye Distance:   Left Eye Distance:   Bilateral Distance:    Right Eye Near:   Left Eye Near:    Bilateral Near:     Physical Exam Vitals and nursing note reviewed.  Constitutional:      General: She is not in acute distress.    Appearance: Normal appearance.  HENT:     Head: Normocephalic.  Eyes:     Extraocular Movements: Extraocular movements intact.     Conjunctiva/sclera: Conjunctivae normal.     Pupils: Pupils are equal, round, and reactive to light.  Pulmonary:     Effort:  Pulmonary effort is normal.  Musculoskeletal:     Cervical back: Normal range of motion.     Left foot: Decreased range of motion. Normal capillary refill. Swelling and tenderness present. Normal pulse.  Neurological:     General: No focal deficit present.     Mental Status: She is alert and oriented to person, place, and time.  Psychiatric:        Mood and Affect: Mood normal.        Behavior: Behavior normal.      UC Treatments / Results  Labs (all labs ordered are listed, but only abnormal results are displayed) Labs Reviewed - No data to display  EKG   Radiology DG Foot Complete Left  Result Date: 07/03/2022 CLINICAL DATA:  Fall, right lateral foot pain  EXAM: LEFT FOOT - COMPLETE 3+ VIEW COMPARISON:  None Available. FINDINGS: There is an acute, oblique fracture of the distal diaphysis of the right fifth metatarsal with 5 mm override, 1/2 shaft with medial displacement, and mild medial angulation of the distal fracture fragment. There is extensive soft tissue swelling of the left forefoot. No additional fracture identified. Joint spaces are preserved. Moderate plantar calcaneal spur. IMPRESSION: Acute, displaced and angulated fracture of the distal diaphysis of the right fifth metatarsal. Electronically Signed   By: Helyn Numbers M.D.   On: 07/03/2022 20:01    Procedures Procedures (including critical care time)  Medications Ordered in UC Medications - No data to display  Initial Impression / Assessment and Plan / UC Course  I have reviewed the triage vital signs and the nursing notes.  Pertinent labs & imaging results that were available during my care of the patient were reviewed by me and considered in my medical decision making (see chart for details).  Patient presents with left foot pain after fall approximately 2 weeks ago.  On exam, she has moderate swelling and tenderness to the left foot.  X-ray shows acute, displaced and angulated fracture of the distal diaphysis of the left fifth metatarsal.  Based on the location of the injury, patient was placed in a cam walker boot.  Supportive care recommendations were provided to the patient.  Patient was advised that she will need to follow-up with orthopedics within the next 24 to 48 hours for further evaluation.  Patient was given information for [facility] and [facility] [place].  Follow-up as needed. Final Clinical Impressions(s) / UC Diagnoses   Final diagnoses:  Displaced fracture of fifth metatarsal bone, left foot, initial encounter for closed fracture     Discharge Instructions      Your x-rays show you have a fracture in the left foot. As discussed, you will need to  follow-up with orthopedics within the next 24 hours.  You can follow-up with [facility] [place] at [phone] or [facility] in [place] at [phone]. Nonweightbearing until you have been seen by orthopedics.  Utilize the crutches as needed. Take medication as prescribed. RICE therapy rest, ice, compression, and elevation to help with swelling. Follow-up as needed.     ED Prescriptions     Medication Sig Dispense Auth. Provider   oxyCODONE-acetaminophen (PERCOCET/ROXICET) 5-325 MG tablet Take 1 tablet by mouth every 12 (twelve) hours. 10 tablet Alyanna Stoermer-Warren, Sadie Haber, NP      I have reviewed the PDMP during this encounter.   Abran Cantor, NP 07/04/22 [phone]

## 2022-07-03 NOTE — Discharge Instructions (Addendum)
Your x-rays show you have a fracture in the left foot. As discussed, you will need to follow-up with orthopedics within the next 24 hours.  You can follow-up with [facility] [place] at [phone] or [facility] in [place] at [phone]. Nonweightbearing until you have been seen by orthopedics.  Utilize the crutches as needed. Take medication as prescribed. RICE therapy rest, ice, compression, and elevation to help with swelling. Follow-up as needed.

## 2022-07-04 ENCOUNTER — Ambulatory Visit: Payer: Medicare Other | Admitting: Orthopaedic Surgery

## 2022-07-04 ENCOUNTER — Encounter: Payer: Self-pay | Admitting: Orthopaedic Surgery

## 2022-07-04 VITALS — Ht 66.0 in | Wt 259.0 lb

## 2022-07-04 DIAGNOSIS — G35D Multiple sclerosis, unspecified: Secondary | ICD-10-CM

## 2022-07-04 DIAGNOSIS — S92352A Displaced fracture of fifth metatarsal bone, left foot, initial encounter for closed fracture: Secondary | ICD-10-CM

## 2022-07-04 DIAGNOSIS — G35 Multiple sclerosis: Secondary | ICD-10-CM

## 2022-07-04 NOTE — Progress Notes (Signed)
Subjective:    Patient ID: Katrina Montes, female    DOB: [birth year], 51 y.o.   MRN: [id number]  HPI She fell at home two weeks ago and hurt her left foot.  She has multiple sclerosis (since 2016) and thought she just had a strain.  The pain continued and she went to the Urgent care yesterday.  X-rays showed: IMPRESSION: Acute, displaced and angulated fracture of the distal diaphysis of the right fifth metatarsal.   She was given crutches and CAM walker.  She had no other injury.  I have independently reviewed and interpreted x-rays of this patient done at another site by another physician or qualified health professional.  I have reviewed the notes from Urgent Care.  She is doing well.  She is concerned she may need surgery.  I told her she does not.     Review of Systems  Constitutional:  Positive for activity change.  Musculoskeletal:  Positive for arthralgias, gait problem and joint swelling.  All other systems reviewed and are negative. For Review of Systems, all other systems reviewed and are negative.  The following is a summary of the past history medically, past history surgically, known current medicines, social history and family history.  This information is gathered electronically by the computer from prior information and documentation.  I review this each visit and have found including this information at this point in the chart is beneficial and informative.   Past Medical History:  Diagnosis Date   Anemia    Anxiety    Back pain    Carpal tunnel syndrome    Constipation    Depression    Diabetes mellitus    Diabetes mellitus, type II (HCC)    Hyperlipidemia    Hypertension    Joint pain    Lactose intolerance    Multiple sclerosis (HCC) 2016   Optic neuritis    Shortness of breath    Sleep apnea    Swallowing difficulty    Swelling of both lower extremities    Vision, loss, sudden    Vitamin D deficiency     Past Surgical History:   Procedure Laterality Date   BREAST REDUCTION SURGERY     COLONOSCOPY WITH PROPOFOL N/A 06/02/2022   Procedure: COLONOSCOPY WITH PROPOFOL;  Surgeon: Corbin Ade, MD;  Location: [facility];  Service: Endoscopy;  Laterality: N/A;  8:45AM   CYST EXCISION     INCISION AND DRAINAGE ABSCESS Left 12/23/2021   Procedure: INCISION AND DRAINAGE BUTTOCK ABSCESS;  Surgeon: Almond Lint, MD;  Location: [facility] OR;  Service: General;  Laterality: Left;   ovarian cyst removed     TUBAL LIGATION     WISDOM TOOTH EXTRACTION      Current Outpatient Medications on File Prior to Visit  Medication Sig Dispense Refill   APPLE CIDER VINEGAR PO Take 1 tablet by mouth daily.     atorvastatin (LIPITOR) 20 MG tablet Take 1 tablet (20 mg total) by mouth daily. 90 tablet 3   Cholecalciferol (VITAMIN D3) 25 MCG (1000 UT) CAPS Take 1 capsule (1,000 Units total) by mouth daily. 30 capsule 3   cyclobenzaprine (FLEXERIL) 10 MG tablet Take 1 tablet (10 mg total) by mouth 2 (two) times daily as needed for muscle spasms. 30 tablet 3   diphenhydrAMINE (BENADRYL) 25 MG tablet Take 25 mg by mouth every 6 (six) hours as needed for allergies.     docusate sodium (COLACE) 50 MG capsule Take 1 capsule (50  mg total) by mouth 2 (two) times daily. (Patient taking differently: Take 50 mg by mouth daily as needed for moderate constipation.) 60 capsule 11   Dulaglutide (TRULICITY) 1.5 MG/0.5ML SOPN Inject 1.5 mg into the skin once a week. 2 mL 2   DULoxetine (CYMBALTA) 60 MG capsule Take 1 capsule (60 mg total) by mouth daily. 90 capsule 3   ELDERBERRY PO Take 1,150 mg by mouth daily.     ferrous sulfate (EQL SLOW RELEASE IRON) 160 (50 Fe) MG TBCR SR tablet Take 1 tablet (160 mg total) by mouth daily. 30 tablet 3   gabapentin (NEURONTIN) 100 MG capsule Take 1 capsule (100 mg total) by mouth 3 (three) times daily. 90 capsule 3   ibuprofen (ADVIL,MOTRIN) 200 MG tablet Take 200 mg by mouth as needed for headache, moderate pain or mild pain.      lisinopril (ZESTRIL) 20 MG tablet Take 1 tablet (20 mg total) by mouth daily. 30 tablet 11   meclizine (ANTIVERT) 25 MG tablet Take 1 tablet (25 mg total) by mouth 3 (three) times daily as needed for dizziness. 30 tablet 6   metFORMIN (GLUCOPHAGE) 500 MG tablet Take 1 tablet (500 mg total) by mouth 2 (two) times daily with a meal. 60 tablet 11   Multiple Vitamin (MULTIVITAMIN) capsule Take 1 capsule by mouth daily.     ocrelizumab (OCREVUS) 300 MG/10ML injection Inject 300 mg into the vein every 6 (six) months.     polyethylene glycol powder (GLYCOLAX/MIRALAX) 17 GM/SCOOP powder Take 1 capful (17 g) in water by mouth daily. (Patient taking differently: Take 17 g by mouth daily as needed for moderate constipation.) 238 g 0   polyethylene glycol-electrolytes (NULYTELY) 420 g solution As directed 4000 mL 0   PRESCRIPTION MEDICATION Place 1 drop into both eyes at bedtime.     rizatriptan (MAXALT) 10 MG tablet Take 1 tablet (10 mg total) by mouth as needed for migraine. May repeat in 2 hours if needed. Max dose: 2/24 hr or 15/30 days. 45 tablet 3   topiramate (TOPAMAX) 100 MG tablet Take 1 tablet (100 mg total) by mouth at bedtime. 90 tablet 4   oxyCODONE-acetaminophen (PERCOCET/ROXICET) 5-325 MG tablet Take 1 tablet by mouth every 12 (twelve) hours. (Patient not taking: Reported on 07/04/2022) 10 tablet 0   No current facility-administered medications on file prior to visit.    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   Right handed.   Social Determinants of Health   Financial Resource Strain: Low Risk  (02/21/2022)   Overall Financial Resource Strain (CARDIA)    Difficulty of Paying Living Expenses: Not very hard  Food Insecurity: No Food Insecurity (02/21/2022)   Hunger Vital Sign    Worried About Running Out of Food in the Last Year: Never true    Ran Out of Food in the Last Year: Never true  Transportation  Needs: No Transportation Needs (02/21/2022)   PRAPARE - Administrator, Civil Service (Medical): No    Lack of Transportation (Non-Medical): No  Physical Activity: Insufficiently Active (02/21/2022)   Exercise Vital Sign    Days of Exercise per Week: 3 days    Minutes of Exercise per Session: 30 min  Stress: No Stress Concern Present (02/21/2022)   [employer] of Occupational Health - Occupational Stress Questionnaire    Feeling of Stress : Not at all  Social Connections: Moderately Integrated (02/21/2022)   Social Connection and Isolation Panel [NHANES]    Frequency of Communication with Friends and Family: Twice a week    Frequency of Social Gatherings with Friends and Family: Twice a week    Attends Religious Services: 1 to 4 times per year    Active Member of [employer] or Organizations: Yes    Attends Banker Meetings: Never    Marital Status: Never married  Intimate Partner Violence: Not At Risk (02/21/2022)   Humiliation, Afraid, Rape, and Kick questionnaire    Fear of Current or Ex-Partner: No    Emotionally Abused: No    Physically Abused: No    Sexually Abused: No    Family History  Problem Relation Age of Onset   Cancer Mother    Diabetes Mother    Hearing loss Mother    Thyroid nodules Mother    Anemia Mother    Kidney disease Mother    Hypertension Mother    Obesity Mother    Breast cancer Mother    Hyperlipidemia Father    High blood pressure Father    Sleep apnea Father    Pancreatic cancer Father    Anemia Sister    Anemia Sister     Arthritis Maternal Grandmother    Arthritis Maternal Grandfather    Arthritis Paternal Grandmother    Kidney disease Paternal Grandmother    Hypertension Paternal Grandmother    Arthritis Paternal Grandfather    Vision loss Paternal Grandfather    Stroke Paternal Grandfather    Hyperlipidemia Paternal Grandfather    Pancreatic cancer Maternal Uncle    Kidney disease Paternal Uncle    Colon cancer Neg Hx    Colon polyps Neg Hx     Ht 5\' 6"  (1.676 m)   Wt 259 lb (117.5 kg)   LMP 06/10/2022   BMI 41.80 kg/m   Body mass index is 41.8 kg/m.      Objective:   Physical Exam Vitals and nursing note reviewed. Exam conducted with a chaperone present.  Constitutional:      Appearance: She is well-developed.  HENT:     Head: Normocephalic and atraumatic.  Eyes:     Conjunctiva/sclera: Conjunctivae normal.     Pupils: Pupils are equal, round, and reactive to light.  Cardiovascular:     Rate and Rhythm: Normal rate and regular rhythm.  Pulmonary:     Effort: Pulmonary effort is normal.  Abdominal:     Palpations: Abdomen is soft.  Musculoskeletal:     Cervical back: Normal range of motion and neck supple.  Skin:    General: Skin is warm and dry.  Neurological:     Mental Status: She is alert and oriented to person, place, and time.     Cranial Nerves: No cranial nerve deficit.     Motor: No abnormal muscle tone.     Coordination: Coordination normal.     Deep Tendon Reflexes: Reflexes are normal and symmetric. Reflexes  normal.  Psychiatric:        Behavior: Behavior normal.        Thought Content: Thought content normal.        Judgment: Judgment normal.    Hospital X-rays state right foot, it is the left foot.       Assessment & Plan:   Encounter Diagnoses  Name Primary?   Closed displaced fracture of fifth metatarsal bone of left foot, initial encounter Yes   Multiple sclerosis (HCC)    Continue the CAM walker and crutches.  I have given sheet about  Contrast Baths.  Do that three to four times a day.  Return in two weeks.  X-rays on return of the left foot.  Call if any problem.  Precautions discussed.  Electronically Signed Darreld Mclean, MD 7/18/20232:36 PM

## 2022-07-04 NOTE — Patient Instructions (Signed)
Do your contrast baths as directed  You may remove the boot when you are sleeping, sitting down, or to bathe. Wear it when you are walking.

## 2022-07-17 DIAGNOSIS — Z0271 Encounter for disability determination: Secondary | ICD-10-CM

## 2022-07-18 ENCOUNTER — Ambulatory Visit (INDEPENDENT_AMBULATORY_CARE_PROVIDER_SITE_OTHER): Payer: Medicare Other

## 2022-07-18 ENCOUNTER — Ambulatory Visit: Payer: Medicare Other | Admitting: Orthopaedic Surgery

## 2022-07-18 ENCOUNTER — Encounter: Payer: Self-pay | Admitting: Orthopaedic Surgery

## 2022-07-18 DIAGNOSIS — S92352A Displaced fracture of fifth metatarsal bone, left foot, initial encounter for closed fracture: Secondary | ICD-10-CM

## 2022-07-18 DIAGNOSIS — S92352D Displaced fracture of fifth metatarsal bone, left foot, subsequent encounter for fracture with routine healing: Secondary | ICD-10-CM

## 2022-07-18 MED ORDER — HYDROCODONE-ACETAMINOPHEN 5-325 MG PO TABS: ORAL_TABLET | ORAL | 0 refills | Status: DC

## 2022-07-18 NOTE — Progress Notes (Signed)
My foot is better.  She still has some pain of the left foot. She is wearing the CAM walker and using a crutch.  She has no new pain or trauma.  NV intact.  Left foot tender laterally.  ROM ankle full.  Encounter Diagnosis  Name Primary?   Closed displaced fracture of fifth metatarsal bone of left foot, initial encounter Yes   Come out of CAM walker some.  I will call in pain medicine as she hurts more at night.  I have reviewed the [place] Controlled Substance Reporting System web site prior to prescribing narcotic medicine for this patient.  Return in two weeks.  X-rays were done, reported separately.  Call if any problem.  Precautions discussed.  Electronically Signed Darreld Mclean, MD 8/1/20238:43 AM

## 2022-07-18 NOTE — Patient Instructions (Signed)
Go into a supportive shoe or sandal around the house without boot wear boot out and about

## 2022-07-19 LAB — HM DIABETES EYE EXAM

## 2022-07-20 ENCOUNTER — Ambulatory Visit: Payer: Medicare Other | Admitting: "Endocrinology

## 2022-07-20 ENCOUNTER — Encounter: Payer: Self-pay | Admitting: "Endocrinology

## 2022-07-20 ENCOUNTER — Ambulatory Visit: Payer: Medicare Other | Admitting: Nutrition

## 2022-07-20 ENCOUNTER — Encounter: Payer: Self-pay | Admitting: Nutrition

## 2022-07-20 ENCOUNTER — Encounter: Payer: Medicare Other | Attending: Nurse Practitioner | Admitting: Nutrition

## 2022-07-20 VITALS — BP 112/82 | HR 64 | Ht 66.0 in

## 2022-07-20 DIAGNOSIS — E559 Vitamin D deficiency, unspecified: Secondary | ICD-10-CM

## 2022-07-20 DIAGNOSIS — E1143 Type 2 diabetes mellitus with diabetic autonomic (poly)neuropathy: Secondary | ICD-10-CM | POA: Insufficient documentation

## 2022-07-20 DIAGNOSIS — G35 Multiple sclerosis: Secondary | ICD-10-CM | POA: Insufficient documentation

## 2022-07-20 DIAGNOSIS — E1165 Type 2 diabetes mellitus with hyperglycemia: Secondary | ICD-10-CM

## 2022-07-20 DIAGNOSIS — E782 Mixed hyperlipidemia: Secondary | ICD-10-CM

## 2022-07-20 DIAGNOSIS — I10 Essential (primary) hypertension: Secondary | ICD-10-CM | POA: Diagnosis not present

## 2022-07-20 LAB — POCT GLYCOSYLATED HEMOGLOBIN (HGB A1C): HbA1c, POC (controlled diabetic range): 7.3 % — AB (ref 0.0–7.0)

## 2022-07-20 LAB — POCT UA - MICROALBUMIN
Creatinine, POC: 300 mg/dL
Microalbumin Ur, POC: 80 mg/L

## 2022-07-20 MED ORDER — VITAMIN D3 125 MCG (5000 UT) PO CAPS: 5000.0000 [IU] | ORAL_CAPSULE | Freq: Every day | ORAL | 1 refills | Status: DC

## 2022-07-20 MED ORDER — TRULICITY 3 MG/0.5ML ~~LOC~~ SOAJ: 3.0000 mg | SUBCUTANEOUS | 2 refills | Status: DC

## 2022-07-20 NOTE — Patient Instructions (Addendum)
Goals  Eat 30 g CHO at breakfast-try overnight oats Keep up the great job.

## 2022-07-20 NOTE — Patient Instructions (Signed)
                                     Advice for Weight Management  -For most of us the best way to lose weight is by diet management. Generally speaking, diet management means consuming less calories intentionally which over time brings about progressive weight loss.  This can be achieved more effectively by avoiding ultra processed carbohydrates, processed meats, unhealthy fats.    It is critically important to know your numbers: how much calorie you are consuming and how much calorie you need. More importantly, our carbohydrates sources should be unprocessed naturally occurring  complex starch food items.  It is always important to balance nutrition also by  appropriate intake of proteins (mainly plant-based), healthy fats/oils, plenty of fruits and vegetables.   -The American College of Lifestyle Medicine (ACL M) recommends nutrition derived mostly from Whole Food, Plant Predominant Sources example an apple instead of applesauce or apple pie. Eat Plenty of vegetables, Mushrooms, fruits, Legumes, Whole Grains, Nuts, seeds in lieu of processed meats, processed snacks/pastries red meat, poultry, eggs.  Use only water or unsweetened tea for hydration.  The College also recommends the need to stay away from risky substances including alcohol, smoking; obtaining 7-9 hours of restorative sleep, at least 150 minutes of moderate intensity exercise weekly, importance of healthy social connections, and being mindful of stress and seek help when it is overwhelming.    -Sticking to a routine mealtime to eat 3 meals a day and avoiding unnecessary snacks is shown to have a big role in weight control. Under normal circumstances, the only time we burn stored energy is when we are hungry, so allow  some hunger to take place- hunger means no food between appropriate meal times, only water.  It is not advisable to starve.   -It is better to avoid simple carbohydrates including:  Cakes, Sweet Desserts, Ice Cream, Soda (diet and regular), Sweet Tea, Candies, Chips, Cookies, Store Bought Juices, Alcohol in Excess of  1-2 drinks a day, Lemonade,  Artificial Sweeteners, Doughnuts, Coffee Creamers, "Sugar-free" Products, etc, etc.  This is not a complete list.....    -Consulting with certified diabetes educators is proven to provide you with the most accurate and current information on diet.  Also, you may be  interested in discussing diet options/exchanges , we can schedule a visit with Katrina Montes, RDN, CDE for individualized nutrition education.  -Exercise: If you are able: 30 -60 minutes a day ,4 days a week, or 150 minutes of moderate intensity exercise weekly.    The longer the better if tolerated.  Combine stretch, strength, and aerobic activities.  If you were told in the past that you have high risk for cardiovascular diseases, or if you are currently symptomatic, you may seek evaluation by your heart doctor prior to initiating moderate to intense exercise programs.                                  Additional Care Considerations for Diabetes/Prediabetes   -Diabetes  is a chronic disease.  The most important care consideration is regular follow-up with your diabetes care provider with the goal being avoiding or delaying its complications and to take advantage of advances in medications and technology.  If appropriate actions are taken early enough, type 2 diabetes can even be   reversed.  Seek information from the right source.  - Whole Food, Plant Predominant Nutrition is highly recommended: Eat Plenty of vegetables, Mushrooms, fruits, Legumes, Whole Grains, Nuts, seeds in lieu of processed meats, processed snacks/pastries red meat, poultry, eggs as recommended by American College of  Lifestyle Medicine (ACLM).  -Type 2 diabetes is known to coexist with other important comorbidities such as high blood pressure and high cholesterol.  It is critical to control not only the  diabetes but also the high blood pressure and high cholesterol to minimize and delay the risk of complications including coronary artery disease, stroke, amputations, blindness, etc.  The good news is that this diet recommendation for type 2 diabetes is also very helpful for managing high cholesterol and high blood blood pressure.  - Studies showed that people with diabetes will benefit from a class of medications known as ACE inhibitors and statins.  Unless there are specific reasons not to be on these medications, the standard of care is to consider getting one from these groups of medications at an optimal doses.  These medications are generally considered safe and proven to help protect the heart and the kidneys.    - People with diabetes are encouraged to initiate and maintain regular follow-up with eye doctors, foot doctors, dentists , and if necessary heart and kidney doctors.     - It is highly recommended that people with diabetes quit smoking or stay away from smoking, and get yearly  flu vaccine and pneumonia vaccine at least every 5 years.  See above for additional recommendations on exercise, sleep, stress management , and healthy social connections.      

## 2022-07-20 NOTE — Progress Notes (Signed)
Medical Nutrition Therapy Follow up Appointment Start time:  1030   Appointment End time:  1100  Primary concerns today: Diabetes Type 2, Obesity Referral diagnosis: E11.8, E66.9 Preferred learning style: No preference.  Learning readiness: Change in progress  NUTRITION ASSESSMENT Follow up DM DX over 15 yrs ago. Has Multiple Sclerosis and neuropathy.  Changes made:  A1C down from 12.4% down to 7.3%.Is following lifestyle medicine. Saw Dr. Fransico Him today. Has been working on walking and moving more.   Has been using video before she hurt her foot. Has a boot on her foot. Has now has been eating 3 meals per day Cut out the sweets, took medications on time Exercising  now and being out door for an hours 3-4 times per week.  Trulicity weekly, Metformin 500 mg BID.  Feels very tired and she feels its related to the need for her  Ocrevus. Gets Iron and has increased that per her Dr. Fransico Him.  Feels much better since her BS are better. FBS: 140's  Bedtime: 140's.  Sees Dr. Hilda Lias.  Has lost almost 40 lbs and she has worked really hard to change what she has been eating and exercising.   Lab Results  Component Value Date   HGBA1C 7.3 (A) 07/20/2022   Wt Readings from Last 3 Encounters:  07/04/22 259 lb (117.5 kg)  06/02/22 259 lb 0.7 oz (117.5 kg)  05/31/22 259 lb 0.7 oz (117.5 kg)   Ht Readings from Last 3 Encounters:  07/20/22 5\' 6"  (1.676 m)  07/04/22 5\' 6"  (1.676 m)  06/02/22 5\' 6"  (1.676 m)   There is no height or weight on file to calculate BMI. @BMIFA @ Facility age limit for growth %iles is 20 years. Facility age limit for growth %iles is 20 years. Clinical Medical Hx: MS, Obesity, Type 2 DM Medications: Metformin 500 mg BID, Tresiba 20 units a day,  Labs:  7 days 114 mg/dl.117 mg/dl,  30 days mg/dl. Lab Results  Component Value Date   HGBA1C 7.3 (A) 07/20/2022      Latest Ref Rng & Units 05/09/2022    2:32 PM 02/13/2022   10:04 AM 01/27/2022    2:27 PM  CMP   Glucose 70 - 99 mg/dL 09/19/2022  05/11/2022  88   BUN 6 - 24 mg/dL 10  13  15    Creatinine 0.57 - 1.00 mg/dL 02/15/2022  03/27/2022  284   Sodium 134 - 144 mmol/L 139  140  138   Potassium 3.5 - 5.2 mmol/L 4.6  4.3  4.6   Chloride 96 - 106 mmol/L 106  104  108   CO2 20 - 29 mmol/L 20  20  17    Calcium 8.7 - 10.2 mg/dL 9.3  9.7  9.3   Total Protein 6.0 - 8.5 g/dL 7.4  7.5  7.3   Total Bilirubin 0.0 - 1.2 mg/dL 0.5  0.3  0.5   Alkaline Phos 44 - 121 IU/L 86  71  61   AST 0 - 40 IU/L 12  10  14    ALT 0 - 32 IU/L 12  12  11     Lipid Panel     Component Value Date/Time   CHOL 177 05/09/2022 1432   TRIG 69 05/09/2022 1432   HDL 79 05/09/2022 1432   CHOLHDL 2.2 05/09/2022 1432   CHOLHDL 2.5 11/19/2020 0931   VLDL 23 11/02/2014 0944   LDLCALC 85 05/09/2022 1432   LDLCALC 87 11/19/2020 0931   LABVLDL 13 05/09/2022  1432    Notable Signs/Symptoms: None  Lifestyle & Dietary Hx Single and lives with her parents. Has MS. On disability.   Estimated daily fluid intake: 100 oz Supplements: Elderberry Sleep: 6 hrs getting sleep apnea machine C Pap Stress / self-care: MS Current average weekly physical activity: Walking three time per week. 30 minutes  24-Hr Dietary Recall First Meal: Eggs , greek yogurt  snack: water Lunch: Salad grilled chicken and veggies, water  Snack: water Third Meal: Chicken, veggies, corn, water Snack:  Beverages: water a lot.  Estimated Energy Needs Calories: 1200 Carbohydrate: 135g Protein: 90g Fat: 33g   NUTRITION DIAGNOSIS  NB-1.1 Food and nutrition-related knowledge deficit As related to Diabetes Type 2.  As evidenced by A1C 12.4%.   NUTRITION INTERVENTION  Nutrition education (E-1) on the following topics:  Nutrition and Diabetes education provided on My Plate, CHO counting, meal planning, portion sizes, timing of meals, avoiding snacks between meals unless having a low blood sugar, target ranges for A1C and blood sugars, signs/symptoms and treatment of  hyper/hypoglycemia, monitoring blood sugars, taking medications as prescribed, benefits of exercising 30 minutes per day and prevention of complications of DM.  Lifestyle Medicine - Whole Food, Plant Predominant Nutrition is highly recommended: Eat Plenty of vegetables, Mushrooms, fruits, Legumes, Whole Grains, Nuts, seeds in lieu of processed meats, processed snacks/pastries red meat, poultry, eggs.    -It is better to avoid simple carbohydrates including: Cakes, Sweet Desserts, Ice Cream, Soda (diet and regular), Sweet Tea, Candies, Chips, Cookies, Store Bought Juices, Alcohol in Excess of  1-2 drinks a day, Lemonade,  Artificial Sweeteners, Doughnuts, Coffee Creamers, "Sugar-free" Products, etc, etc.  This is not a complete list.....  Exercise: If you are able: 30 -60 minutes a day ,4 days a week, or 150 minutes a week.  The longer the better.  Combine stretch, strength, and aerobic activities.  If you were told in the past that you have high risk for cardiovascular diseases, you may seek evaluation by your heart doctor prior to initiating moderate to intense exercise programs.   Handouts Provided Include  Lifestyle medicine meal plan Calorie density of foods Meal Plan Card Plant based grocery list.  Learning Style & Readiness for Change Teaching method utilized: Visual & Auditory  Demonstrated degree of understanding via: Teach Back  Barriers to learning/adherence to lifestyle change: none  Goals Established by Pt Goals Goals  Eat 30 g CHO at breakfast-try overnight oats Keep up the great job.    Lifestyle Medicine - Whole Food, Plant Predominant Nutrition is highly recommended: Eat Plenty of vegetables, Mushrooms, fruits, Legumes, Whole Grains, Nuts, seeds in lieu of processed meats, processed snacks/pastries red meat, poultry, eggs.    -It is better to avoid simple carbohydrates including: Cakes, Sweet Desserts, Ice Cream, Soda (diet and regular), Sweet Tea, Candies, Chips,  Cookies, Store Bought Juices, Alcohol in Excess of  1-2 drinks a day, Lemonade,  Artificial Sweeteners, Doughnuts, Coffee Creamers, "Sugar-free" Products, etc, etc.  This is not a complete list.....  Exercise: If you are able: 30 -60 minutes a day ,4 days a week, or 150 minutes a week.  The longer the better.  Combine stretch, strength, and aerobic activities.  If you were told in the past that you have high risk for cardiovascular diseases, you may seek evaluation by your heart doctor prior to initiating moderate to intense exercise programs.   MONITORING & EVALUATION Dietary intake, weekly physical activity, and BS and weight in 1 month.  Next Steps  Patient is to work on better meal planning.

## 2022-07-20 NOTE — Progress Notes (Signed)
Endocrinology Consult Note       07/20/2022, 4:35 PM   Subjective:    Patient ID: Katrina Montes, female    DOB: 09-09-1971.  Katrina Montes is being seen in consultation for management of currently uncontrolled symptomatic diabetes requested by  Donell Beers, FNP.   Past Medical History:  Diagnosis Date   Anemia    Anxiety    Back pain    Carpal tunnel syndrome    Constipation    Depression    Diabetes mellitus    Diabetes mellitus, type II (HCC)    Hyperlipidemia    Hypertension    Joint pain    Lactose intolerance    Multiple sclerosis (HCC) 2016   Optic neuritis    Shortness of breath    Sleep apnea    Swallowing difficulty    Swelling of both lower extremities    Vision, loss, sudden    Vitamin D deficiency     Past Surgical History:  Procedure Laterality Date   BREAST REDUCTION SURGERY     COLONOSCOPY WITH PROPOFOL N/A 06/02/2022   Procedure: COLONOSCOPY WITH PROPOFOL;  Surgeon: Corbin Ade, MD;  Location: [facility];  Service: Endoscopy;  Laterality: N/A;  8:45AM   CYST EXCISION     INCISION AND DRAINAGE ABSCESS Left 12/23/2021   Procedure: INCISION AND DRAINAGE BUTTOCK ABSCESS;  Surgeon: Almond Lint, MD;  Location: [facility] OR;  Service: General;  Laterality: Left;   ovarian cyst removed     TUBAL LIGATION     WISDOM TOOTH EXTRACTION      Social History   Socioeconomic History   Marital status: Single    Spouse name: Not on file   Number of children: 0   Years of education: College   Highest education level: Not on file  Occupational History   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   Right handed.   Social Determinants of Health   Financial Resource Strain: Low Risk  (02/21/2022)   Overall Financial Resource Strain (CARDIA)    Difficulty of Paying Living Expenses: Not very hard  Food Insecurity: No Food Insecurity (02/21/2022)   Hunger Vital Sign    Worried About Running Out of Food in the Last Year: Never true    Ran Out of Food in the Last Year: Never true  Transportation Needs: No Transportation Needs (02/21/2022)   PRAPARE - Administrator, Civil Service (Medical): No    Lack of Transportation (Non-Medical): No  Physical Activity: Insufficiently Active (02/21/2022)   Exercise Vital Sign    Days of Exercise per Week: 3 days    Minutes of Exercise per Session: 30 min  Stress: No Stress Concern Present (02/21/2022)   [employer] of Occupational Health - Occupational  Stress Questionnaire    Feeling of Stress : Not at all  Social Connections: Moderately Integrated (02/21/2022)   Social Connection and Isolation Panel [NHANES]    Frequency of Communication with Friends and Family: Twice a week    Frequency of Social Gatherings with Friends and Family: Twice a week    Attends Religious Services: 1 to 4 times per year    Active Member of [employer] or Organizations: Yes    Attends Banker Meetings: Never    Marital Status: Never married    Family History  Problem Relation Age of Onset   Cancer Mother    Diabetes Mother    Hearing loss Mother    Thyroid nodules Mother    Anemia Mother    Kidney disease Mother    Hypertension Mother    Obesity Mother    Breast cancer Mother    Hyperlipidemia Father    High blood pressure Father    Sleep apnea Father    Pancreatic cancer Father    Anemia Sister    Anemia Sister    Arthritis Maternal Grandmother    Arthritis Maternal Grandfather    Arthritis Paternal Grandmother    Kidney disease Paternal Grandmother    Hypertension Paternal Grandmother    Arthritis Paternal  Grandfather    Vision loss Paternal Grandfather    Stroke Paternal Grandfather    Hyperlipidemia Paternal Grandfather    Pancreatic cancer Maternal Uncle    Kidney disease Paternal Uncle    Colon cancer Neg Hx    Colon polyps Neg Hx     Outpatient Encounter Medications as of 07/20/2022  Medication Sig   Cholecalciferol (VITAMIN D3) 125 MCG (5000 UT) CAPS Take 1 capsule (5,000 Units total) by mouth daily.   Dulaglutide (TRULICITY) 3 MG/0.5ML SOPN Inject 3 mg as directed once a week.   APPLE CIDER VINEGAR PO Take 1 tablet by mouth daily.   atorvastatin (LIPITOR) 20 MG tablet Take 1 tablet (20 mg total) by mouth daily.   cyclobenzaprine (FLEXERIL) 10 MG tablet Take 1 tablet (10 mg total) by mouth 2 (two) times daily as needed for muscle spasms.   diphenhydrAMINE (BENADRYL) 25 MG tablet Take 25 mg by mouth every 6 (six) hours as needed for allergies.   docusate sodium (COLACE) 50 MG capsule Take 1 capsule (50 mg total) by mouth 2 (two) times daily. (Patient taking differently: Take 50 mg by mouth daily as needed for moderate constipation.)   DULoxetine (CYMBALTA) 60 MG capsule Take 1 capsule (60 mg total) by mouth daily.   ELDERBERRY PO Take 1,150 mg by mouth daily.   ferrous sulfate (EQL SLOW RELEASE IRON) 160 (50 Fe) MG TBCR SR tablet Take 1 tablet (160 mg total) by mouth daily.   gabapentin (NEURONTIN) 100 MG capsule Take 1 capsule (100 mg total) by mouth 3 (three) times daily.   HYDROcodone-acetaminophen (NORCO/VICODIN) 5-325 MG tablet One tablet every four hours as needed for acute pain.  Limit of five days per [place].   ibuprofen (ADVIL,MOTRIN) 200 MG tablet Take 200 mg by mouth as needed for headache, moderate pain or mild pain.   lisinopril (ZESTRIL) 20 MG tablet Take 1 tablet (20 mg total) by mouth daily.   meclizine (ANTIVERT) 25 MG tablet Take 1 tablet (25 mg total) by mouth 3 (three) times daily as needed for dizziness.   metFORMIN (GLUCOPHAGE) 500 MG tablet Take 1 tablet  (500 mg total) by mouth 2 (two) times daily with a meal.  Multiple Vitamin (MULTIVITAMIN) capsule Take 1 capsule by mouth daily.   ocrelizumab (OCREVUS) 300 MG/10ML injection Inject 300 mg into the vein every 6 (six) months.   oxyCODONE-acetaminophen (PERCOCET/ROXICET) 5-325 MG tablet Take 1 tablet by mouth every 12 (twelve) hours. (Patient not taking: Reported on 07/04/2022)   polyethylene glycol powder (GLYCOLAX/MIRALAX) 17 GM/SCOOP powder Take 1 capful (17 g) in water by mouth daily. (Patient taking differently: Take 17 g by mouth daily as needed for moderate constipation.)   polyethylene glycol-electrolytes (NULYTELY) 420 g solution As directed   PRESCRIPTION MEDICATION Place 1 drop into both eyes at bedtime.   rizatriptan (MAXALT) 10 MG tablet Take 1 tablet (10 mg total) by mouth as needed for migraine. May repeat in 2 hours if needed. Max dose: 2/24 hr or 15/30 days.   topiramate (TOPAMAX) 100 MG tablet Take 1 tablet (100 mg total) by mouth at bedtime.   [DISCONTINUED] Cholecalciferol (VITAMIN D3) 25 MCG (1000 UT) CAPS Take 1 capsule (1,000 Units total) by mouth daily.   [DISCONTINUED] Dulaglutide (TRULICITY) 1.5 MG/0.5ML SOPN Inject 1.5 mg into the skin once a week.   No facility-administered encounter medications on file as of 07/20/2022.    ALLERGIES: No Known Allergies  VACCINATION STATUS: Immunization History  Administered Date(s) Administered   Influenza Whole 10/24/2010   Influenza,inj,Quad PF,6+ Mos 09/01/2014, 03/14/2019, 09/17/2019, 12/02/2020, 01/27/2022   Moderna Covid-19 Vaccine Bivalent Booster 73yrs & up 07/21/2021   Moderna SARS-COV2 Booster Vaccination 11/16/2020   Moderna Sars-Covid-2 Vaccination 02/09/2020, 03/09/2020   Pneumococcal Polysaccharide-23 08/04/2014   Tdap 09/17/2019    Diabetes She presents for her follow-up diabetic visit. She has type 2 diabetes mellitus. Onset time: She was diagnosed at approximate age of 81 years. Her disease course has been  fluctuating. There are no hypoglycemic associated symptoms. Pertinent negatives for hypoglycemia include no confusion, headaches, pallor or seizures. Associated symptoms include fatigue. Pertinent negatives for diabetes include no chest pain, no polydipsia, no polyphagia and no polyuria. There are no hypoglycemic complications. Symptoms are improving (More recently she has shown near target glycemic profile on insulin treatment.  She presents with a meter showing average blood glucose of 123 mg per DL). There are no diabetic complications. Risk factors for coronary artery disease include diabetes mellitus, dyslipidemia, hypertension, obesity and sedentary lifestyle. Current diabetic treatment includes insulin injections (She is currently on Tresiba 20 units nightly, and metformin 500 mg p.o. twice daily.). Her weight is fluctuating minimally. She is following a generally unhealthy diet. When asked about meal planning, she reported none. She has not had a previous visit with a dietitian. She rarely participates in exercise. Her home blood glucose trend is fluctuating minimally. Her overall blood glucose range is 130-140 mg/dl. (Katrina Montes presents with near target glycemic control high, point-of-care A1c is 7.3%, still overall improving from 12.4%.  She denies hypoglycemia.  She tolerated her Trulicity at 1.5 mg subcutaneously weekly along with low-dose metformin 500 mg p.o. twice daily.    ) An ACE inhibitor/angiotensin II receptor blocker is being taken.  Hypertension This is a chronic problem. The current episode started more than 1 year ago. Pertinent negatives include no chest pain, headaches, palpitations or shortness of breath. Risk factors for coronary artery disease include dyslipidemia, diabetes mellitus, obesity, sedentary lifestyle and smoking/tobacco exposure. Past treatments include ACE inhibitors.  Hyperlipidemia This is a chronic problem. The current episode started more than 1 year ago.  Exacerbating diseases include diabetes and obesity. Pertinent negatives include no chest pain, myalgias or shortness  of breath. Risk factors for coronary artery disease include diabetes mellitus, dyslipidemia, family history, hypertension, obesity and a sedentary lifestyle.     Review of Systems  Constitutional:  Positive for fatigue. Negative for chills, fever and unexpected weight change.  HENT:  Negative for trouble swallowing and voice change.   Eyes:  Negative for visual disturbance.  Respiratory:  Negative for cough, shortness of breath and wheezing.   Cardiovascular:  Negative for chest pain, palpitations and leg swelling.  Gastrointestinal:  Negative for diarrhea, nausea and vomiting.  Endocrine: Negative for cold intolerance, heat intolerance, polydipsia, polyphagia and polyuria.  Musculoskeletal:  Negative for arthralgias and myalgias.  Skin:  Negative for color change, pallor, rash and wound.  Neurological:  Negative for seizures and headaches.  Psychiatric/Behavioral:  Negative for confusion and suicidal ideas.     Objective:       07/20/2022   10:07 AM 07/04/2022    1:59 PM 07/03/2022    7:49 PM  Vitals with BMI  Height     Weight  259 lbs   BMI  41.82   Systolic 112  139  Diastolic 82  88  Pulse 64  71    BP 112/82   Pulse 64   Ht  (1.676 m)   LMP 06/10/2022   BMI 41.80 kg/m   Wt Readings from Last 3 Encounters:  07/04/22 259 lb (117.5 kg)  06/02/22 259 lb 0.7 oz (117.5 kg)  05/31/22 259 lb 0.7 oz (117.5 kg)     She has injury to the left lower extremity after a fall, she is in a heavy stabilizing boots.  CMP ( most recent) CMP     Component Value Date/Time   NA 139 05/09/2022 1432   K 4.6 05/09/2022 1432   CL 106 05/09/2022 1432   CO2 20 05/09/2022 1432   GLUCOSE 112 (H) 05/09/2022 1432   GLUCOSE 127 (H) 12/26/2021 0212   BUN 10 05/09/2022 1432   CREATININE 0.64 05/09/2022 1432   CREATININE 0.67 11/19/2020 0931   CALCIUM 9.3  05/09/2022 1432   PROT 7.4 05/09/2022 1432   ALBUMIN 4.2 05/09/2022 1432   AST 12 05/09/2022 1432   ALT 12 05/09/2022 1432   ALKPHOS 86 05/09/2022 1432   BILITOT 0.5 05/09/2022 1432   GFRNONAA >60 12/26/2021 0212   GFRAA 120 07/19/2020 0813     Diabetic Labs (most recent): Lab Results  Component Value Date   HGBA1C 7.3 (A) 07/20/2022   HGBA1C 6.4 04/18/2022   HGBA1C 12.4 (H) 12/23/2021   MICROALBUR 80 07/20/2022   MICROALBUR 1.1 11/19/2020   MICROALBUR 0.9 05/14/2019     Lipid Panel ( most recent) Lipid Panel     Component Value Date/Time   CHOL 177 05/09/2022 1432   TRIG 69 05/09/2022 1432   HDL 79 05/09/2022 1432   CHOLHDL 2.2 05/09/2022 1432   CHOLHDL 2.5 11/19/2020 0931   VLDL 23 11/02/2014 0944   LDLCALC 85 05/09/2022 1432   LDLCALC 87 11/19/2020 0931   LABVLDL 13 05/09/2022 1432      Lab Results  Component Value Date   TSH 0.900 01/27/2022   TSH 1.050 10/21/2018   TSH 1.250 02/26/2018   TSH 1.281 07/28/2014   TSH 1.217 01/10/2011   TSH 1.644 09/22/2010   FREET4 0.84 01/27/2022       Assessment & Plan:   1. Poorly controlled type 2 diabetes mellitus (HCC)  - Katrina Montes has currently uncontrolled symptomatic type 2 DM  since  51 years of age.  Katrina Montes presents with near target glycemic control high, point-of-care A1c is 7.3%, still overall improving from 12.4%.  She denies hypoglycemia.  She tolerated her Trulicity at 1.5 mg subcutaneously weekly along with low-dose metformin 500 mg p.o. twice daily.  She does not report any hypoglycemia.   Recent labs reviewed. - I had a long discussion with her about the progressive nature of diabetes and the pathology behind its complications. -her diabetes is complicated by obesity/sedentary life and she remains at a high risk for more acute and chronic complications which include CAD, CVA, CKD, retinopathy, and neuropathy. These are all discussed in detail with her.  - I discussed all available  options of managing her diabetes including de-escalation of medications. I have counseled her on diet  and weight management  by adopting a Whole Food , Plant Predominant  ( WFPP) nutrition as recommended by [employer] of Lifestyle Medicine. Patient is encouraged to switch to  unprocessed or minimally processed  complex starch, adequate protein intake (mainly plant source), minimal liquid fat ( mainly vegetable oils), plenty of fruits, and vegetables. -  she is advised to stick to a routine mealtimes to eat 3 complete meals a day and snack only when necessary ( to snack only to correct hypoglycemia BG <70 day time or <100 at night).   - she acknowledges that there is a room for improvement in her food and drink choices. - Suggestion is made for her to avoid simple carbohydrates  from her diet including Cakes, Sweet Desserts, Ice Cream, Soda (diet and regular), Sweet Tea, Candies, Chips, Cookies, Store Bought Juices, Alcohol , Artificial Sweeteners,  Coffee Creamer, and "Sugar-free" Products, Lemonade. This will help patient to have more stable blood glucose profile and potentially avoid unintended weight gain.  The following Lifestyle Medicine recommendations according to American College of Lifestyle Medicine  [facility]) were discussed and and offered to patient and she  agrees to start the journey:  A. Whole Foods, Plant-Based Nutrition comprising of fruits and vegetables, plant-based proteins, whole-grain carbohydrates was discussed in detail with the patient.   A list for source of those nutrients were also provided to the patient.  Patient will use only water or unsweetened tea for hydration. B.  The need to stay away from risky substances including alcohol, smoking; obtaining 7 to 9 hours of restorative sleep, at least 150 minutes of moderate intensity exercise weekly, the importance of healthy social connections,  and stress management techniques were discussed. C.  A full color page of  Calorie  density of various food groups per pound showing examples of each food groups was provided to the patient.   - she has been  scheduled with Norm Salt, RDN, CDE for individualized diabetes education.  - I have approached her with the following plan to manage  her diabetes and patient agrees:   --In light of her presentation with near target glycemic profile, she will not need insulin treatment for now.  However, she will tolerate and benefit from higher dose of Trulicity.  I discussed and increase her Trulicity to 3 mg subcutaneously weekly.  She is encouraged to continue to monitor blood glucose twice a day-daily before breakfast and at bedtime. - She will continue metformin 500 mg p.o. twice daily.  - she is encouraged to call clinic for blood glucose levels less than 70 or above 200 mg /dl. - Specific targets for  A1c;  LDL, HDL,  and Triglycerides  were discussed with the patient.  2) Blood Pressure /Hypertension:    Her blood pressure is controlled to near target.   she is advised to continue her current medications including lisinopril 10 mg p.o. daily with breakfast .   3) Lipids/Hyperlipidemia:   Review of her recent lipid panel showed   improved LDL to 85 from 128.  Is mainly due to her whole food plant-based diet transformation.  She is advised to continue atorvastatin 10 mg p.o. nightly for now.    Side effects and precautions discussed with her.  4)  Weight/Diet:  Body mass index is 41.8 kg/m.  -She was not weighed today due to the heavy boots she is wearing.  Clearly complicating her diabetes care.   she is  a candidate for weight loss. I discussed with her the fact that loss of 5 - 10% of her  current body weight will have the most impact on her diabetes management.  The above detailed  AC[facility] recommendations for nutrition, exercise, sleep, social life, avoidance of risky substances, the need for restorative sleep   information will also detailed on discharge instructions.  5)  vitamin D deficiency: She is advised to continue vitamin D3 5000 units daily.  6) Chronic Care/Health Maintenance:  -she  is on ACEI/ARB and Statin medications and  is encouraged to initiate and continue to follow up with Ophthalmology, Dentist,  Podiatrist at least yearly or according to recommendations, and advised to   stay away from smoking. I have recommended yearly flu vaccine and pneumonia vaccine at least every 5 years; moderate intensity exercise for up to 150 minutes weekly; and  sleep for 7- 9 hours a day.  - she is  advised to maintain close follow up with Donell Beers, FNP for primary care needs, as well as her other providers for optimal and coordinated care.   I spent 41 minutes in the care of the patient today including review of labs from CMP, Lipids, Thyroid Function, Hematology (current and previous including abstractions from other facilities); face-to-face time discussing  her blood glucose readings/logs, discussing hypoglycemia and hyperglycemia episodes and symptoms, medications doses, her options of short and long term treatment based on the latest standards of care / guidelines;  discussion about incorporating lifestyle medicine;  and documenting the encounter. Risk reduction counseling performed per USPSTF guidelines to reduce obesity and cardiovascular risk factors.     Please refer to Patient Instructions for Blood Glucose Monitoring and Insulin/Medications Dosing Guide"  in media tab for additional information. Please  also refer to " Patient Self Inventory" in the Media  tab for reviewed elements of pertinent patient history.  Katrina Montes participated in the discussions, expressed understanding, and voiced agreement with the above plans.  All questions were answered to her satisfaction. she is encouraged to contact clinic should she have any questions or concerns prior to her return visit.   Follow up plan: - Return in about 4 months (around 11/19/2022)  for F/U with Pre-visit Labs, Meter/CGM/Logs, A1c here.  Marquis Lunch, MD [facility] [address] [place], [place] [address] Phone: [phone]  Fax: [phone]    07/20/2022, 4:35 PM  This note was partially dictated with voice recognition software. Similar sounding words can be transcribed inadequately or may not  be corrected upon review.

## 2022-07-26 ENCOUNTER — Encounter (INDEPENDENT_AMBULATORY_CARE_PROVIDER_SITE_OTHER): Payer: Self-pay

## 2022-08-01 ENCOUNTER — Ambulatory Visit (INDEPENDENT_AMBULATORY_CARE_PROVIDER_SITE_OTHER): Payer: Medicare Other

## 2022-08-01 ENCOUNTER — Encounter: Payer: Self-pay | Admitting: Orthopaedic Surgery

## 2022-08-01 ENCOUNTER — Ambulatory Visit (INDEPENDENT_AMBULATORY_CARE_PROVIDER_SITE_OTHER): Payer: Medicare Other | Admitting: Orthopaedic Surgery

## 2022-08-01 DIAGNOSIS — S92352D Displaced fracture of fifth metatarsal bone, left foot, subsequent encounter for fracture with routine healing: Secondary | ICD-10-CM | POA: Diagnosis not present

## 2022-08-01 NOTE — Progress Notes (Signed)
I am better.  X-rays were done of the left foot today, reported separately.  She has no redness or swelling of the left foot.  NV intact.  She has crutch and CAM walker.  Encounter Diagnosis  Name Primary?   Closed displaced fracture of fifth metatarsal bone of left foot with routine healing, subsequent encounter Yes   I have told her to come out of the CAM walker as tolerated.  Use lace shoe.  Return in two weeks.  X-rays then on return left foot.  Call if any problem.  Precautions discussed.  Electronically Signed Darreld Mclean, MD 8/15/20238:59 AM

## 2022-08-02 ENCOUNTER — Encounter: Payer: Self-pay | Admitting: Nurse Practitioner

## 2022-08-03 ENCOUNTER — Ambulatory Visit: Payer: Medicare Other | Admitting: Neurology

## 2022-08-15 ENCOUNTER — Ambulatory Visit (INDEPENDENT_AMBULATORY_CARE_PROVIDER_SITE_OTHER): Payer: Medicare Other | Admitting: Orthopaedic Surgery

## 2022-08-15 ENCOUNTER — Ambulatory Visit (INDEPENDENT_AMBULATORY_CARE_PROVIDER_SITE_OTHER): Payer: Medicare Other

## 2022-08-15 ENCOUNTER — Encounter: Payer: Self-pay | Admitting: Nutrition

## 2022-08-15 ENCOUNTER — Encounter: Payer: Self-pay | Admitting: Orthopaedic Surgery

## 2022-08-15 DIAGNOSIS — S92352D Displaced fracture of fifth metatarsal bone, left foot, subsequent encounter for fracture with routine healing: Secondary | ICD-10-CM | POA: Diagnosis not present

## 2022-08-15 NOTE — Progress Notes (Signed)
I feel better. She has been using the CAM walker.  She has little pain.  NV intact.  No redness.  X-rays were done, reported separately of the left foot.  Encounter Diagnosis  Name Primary?   Closed displaced fracture of fifth metatarsal bone of left foot with routine healing, subsequent encounter Yes   I have shown her the X-rays.  Little callus is present.  Come out of CAM walker and walk as tolerated.  Return in three weeks.  X-rays then.  Call if any problem.  Precautions discussed.  Electronically Signed Darreld Mclean, MD 8/29/20238:41 AM

## 2022-09-02 IMAGING — MG MM DIGITAL SCREENING BILAT W/ TOMO AND CAD
6 of 12 series · 6 of 36 positions shown · non-contrast
Comparison: Previous exam(s).

CLINICAL DATA: Screening.

EXAM:
DIGITAL SCREENING BILATERAL MAMMOGRAM WITH TOMOSYNTHESIS AND CAD
TECHNIQUE: Bilateral screening digital craniocaudal and mediolateral oblique
mammograms were obtained. Bilateral screening digital breast
tomosynthesis was performed. The images were evaluated with
computer-aided detection.

[R MLO synth-2D (1 of 2)]
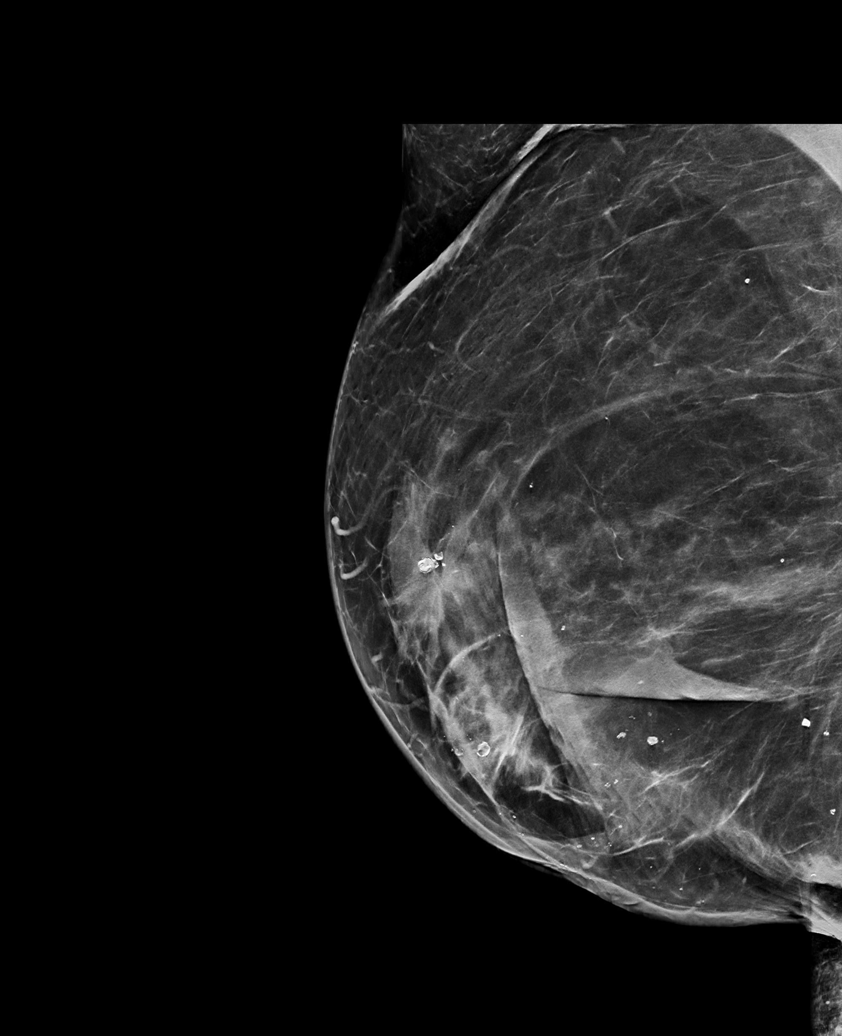

[L CC synth-2D]
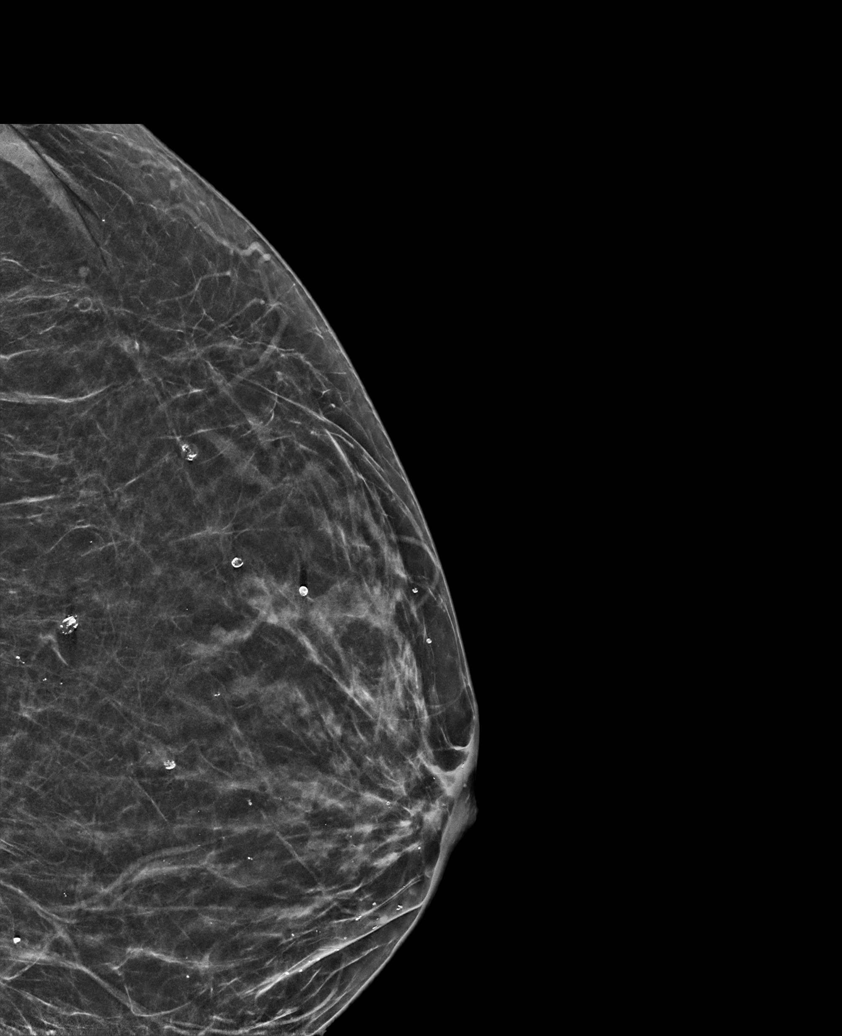

[R MLO synth-2D (2 of 2)]
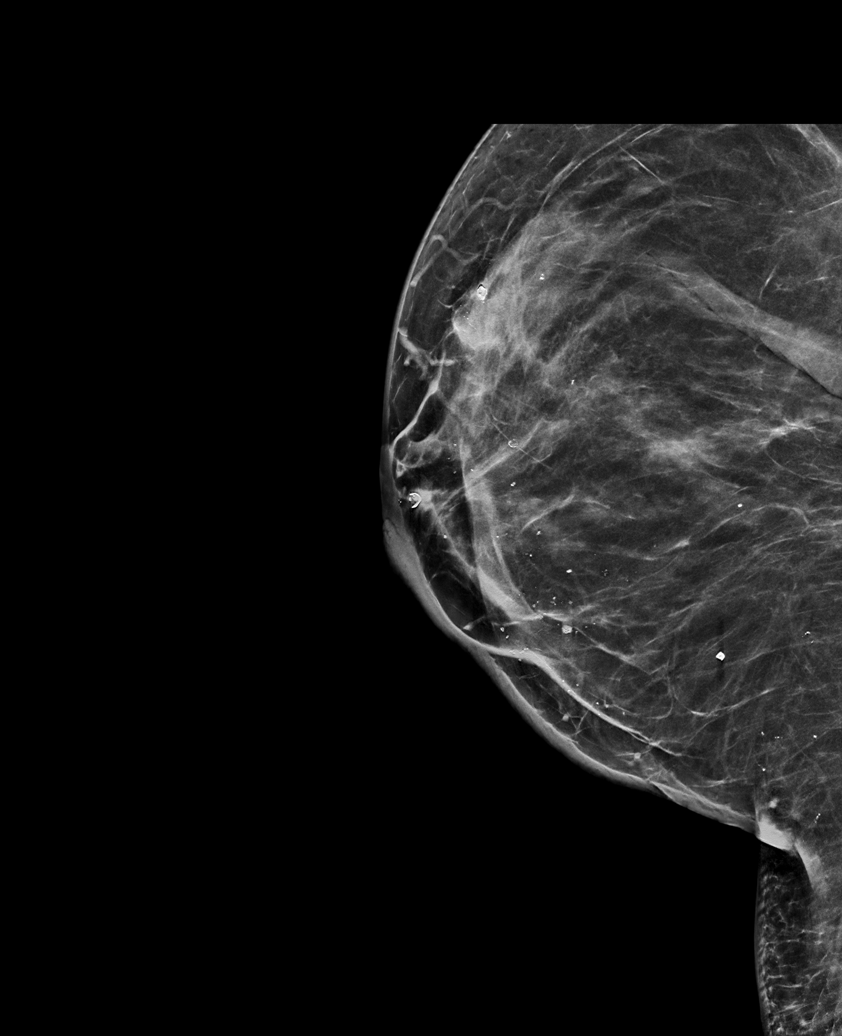

[L MLO synth-2D]
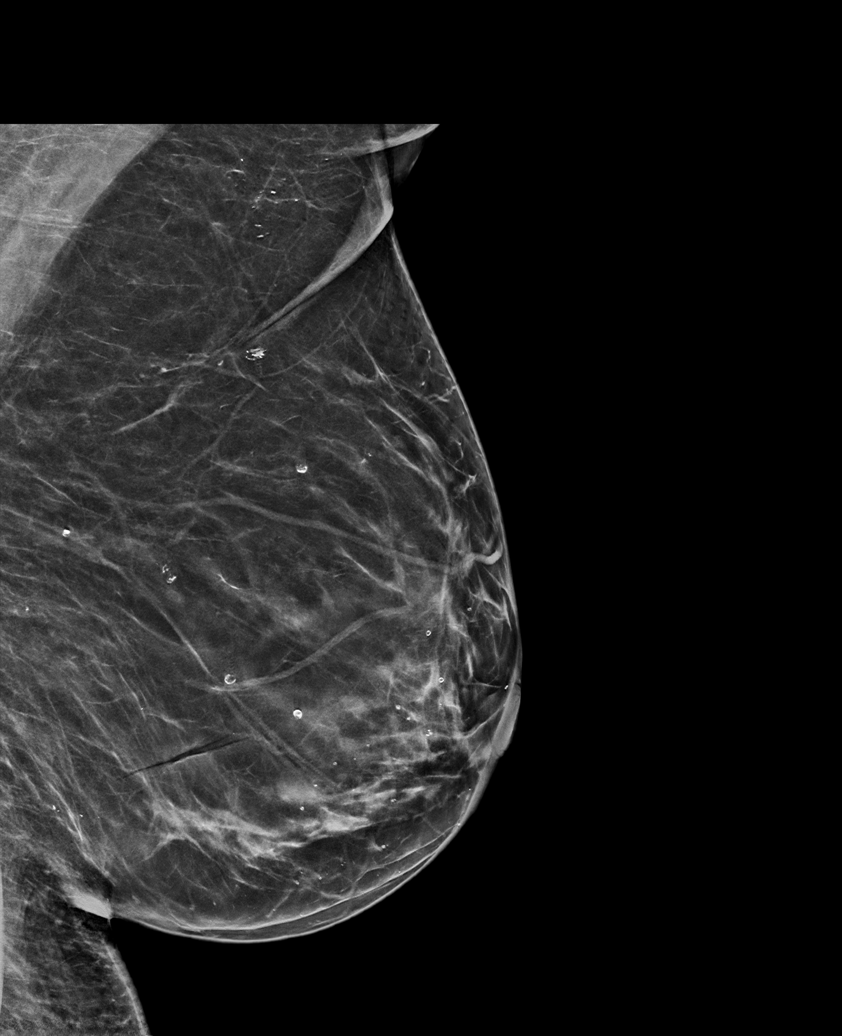

[R CC synth-2D]
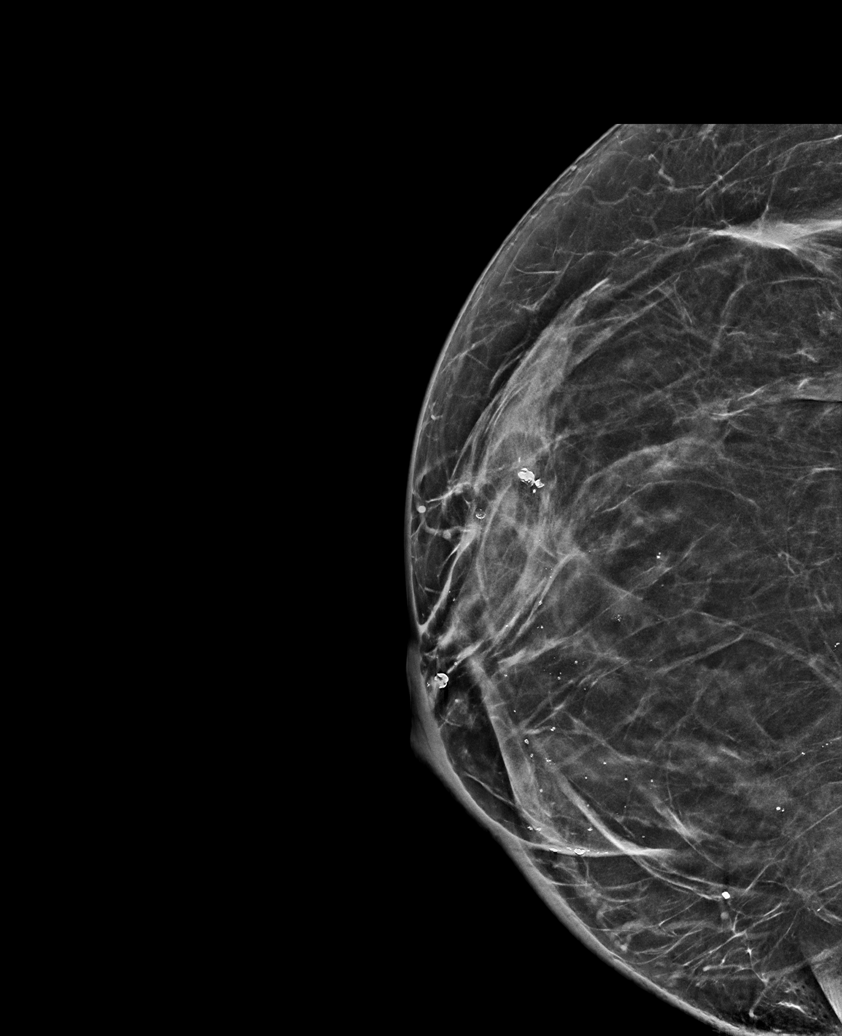

[L CV synth-2D]
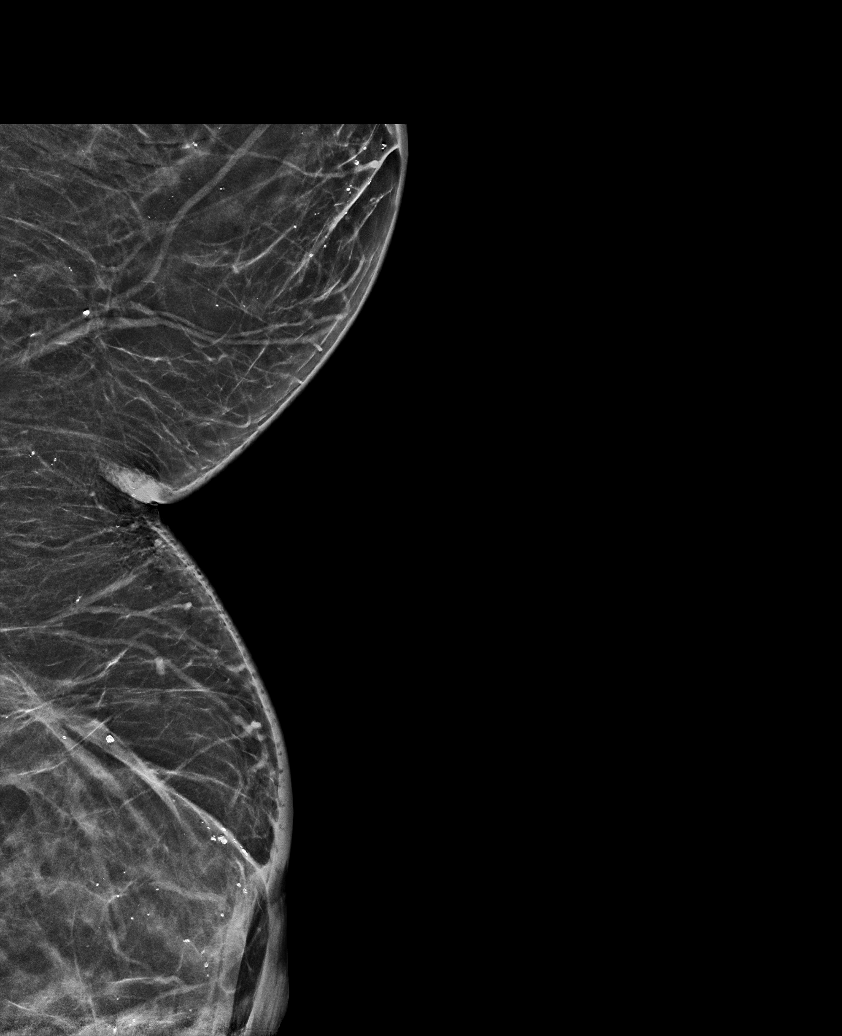

[6 of 36 positions shown; findings below may reference images not displayed]

ACR Breast Density Category b: There are scattered areas of
fibroglandular density.
FINDINGS: There are no findings suspicious for malignancy.
IMPRESSION: No mammographic evidence of malignancy. A result letter of this
screening mammogram will be mailed directly to the patient.

RECOMMENDATION:
Screening mammogram in one year. (Code:51-O-LD2)

BI-RADS CATEGORY  1: Negative.

## 2022-09-05 ENCOUNTER — Encounter: Payer: Self-pay | Admitting: Orthopaedic Surgery

## 2022-09-05 ENCOUNTER — Ambulatory Visit (INDEPENDENT_AMBULATORY_CARE_PROVIDER_SITE_OTHER): Payer: Medicare Other | Admitting: Orthopaedic Surgery

## 2022-09-05 ENCOUNTER — Ambulatory Visit (INDEPENDENT_AMBULATORY_CARE_PROVIDER_SITE_OTHER): Payer: Medicare Other

## 2022-09-05 DIAGNOSIS — S92352D Displaced fracture of fifth metatarsal bone, left foot, subsequent encounter for fracture with routine healing: Secondary | ICD-10-CM

## 2022-09-05 DIAGNOSIS — S92352G Displaced fracture of fifth metatarsal bone, left foot, subsequent encounter for fracture with delayed healing: Secondary | ICD-10-CM

## 2022-09-05 MED ORDER — HYDROCODONE-ACETAMINOPHEN 5-325 MG PO TABS: ORAL_TABLET | ORAL | 0 refills | Status: DC

## 2022-09-05 NOTE — Progress Notes (Signed)
My foot is tender  She has had some back pain from the way she walks because of the left foot pain.  She has stopped the CAM walker.  She has no new trauma.  Left foot is tender.  No redness.  NV intact.  X-rays were done of the left foot, reported separately.  Encounter Diagnosis  Name Primary?   Closed displaced fracture of fifth metatarsal bone of left foot with delayed healing Yes   I have explained the findings of the X-rays. She may need surgery.  I may need to have her see Dr. Sharol Given.  Return in one month.  X-rays then.  I have reviewed the [place] web site prior to prescribing narcotic medicine for this patient.  Call if any problem.  Precautions discussed.  Electronically Signed Sanjuana Kava, MD 9/19/20238:53 AM

## 2022-09-11 ENCOUNTER — Ambulatory Visit: Payer: Medicare Other | Admitting: Nurse Practitioner

## 2022-09-12 ENCOUNTER — Encounter: Payer: Self-pay | Admitting: Family Medicine

## 2022-09-12 ENCOUNTER — Ambulatory Visit: Payer: Medicare Other | Admitting: Family Medicine

## 2022-09-12 VITALS — BP 130/83 | HR 88 | Ht 66.0 in | Wt 266.0 lb

## 2022-09-12 DIAGNOSIS — R21 Rash and other nonspecific skin eruption: Secondary | ICD-10-CM

## 2022-09-12 DIAGNOSIS — G35 Multiple sclerosis: Secondary | ICD-10-CM

## 2022-09-12 DIAGNOSIS — I10 Essential (primary) hypertension: Secondary | ICD-10-CM | POA: Diagnosis not present

## 2022-09-12 DIAGNOSIS — F320 Major depressive disorder, single episode, mild: Secondary | ICD-10-CM

## 2022-09-12 DIAGNOSIS — S92342G Displaced fracture of fourth metatarsal bone, left foot, subsequent encounter for fracture with delayed healing: Secondary | ICD-10-CM | POA: Insufficient documentation

## 2022-09-12 DIAGNOSIS — L700 Acne vulgaris: Secondary | ICD-10-CM | POA: Insufficient documentation

## 2022-09-12 DIAGNOSIS — E1165 Type 2 diabetes mellitus with hyperglycemia: Secondary | ICD-10-CM

## 2022-09-12 DIAGNOSIS — E559 Vitamin D deficiency, unspecified: Secondary | ICD-10-CM

## 2022-09-12 DIAGNOSIS — Z2821 Immunization not carried out because of patient refusal: Secondary | ICD-10-CM | POA: Diagnosis not present

## 2022-09-12 MED ORDER — TRIAMCINOLONE ACETONIDE 0.1 % EX CREA: 1.0000 | TOPICAL_CREAM | Freq: Two times a day (BID) | CUTANEOUS | 0 refills | Status: AC

## 2022-09-12 NOTE — Assessment & Plan Note (Signed)
Small papules were noted on her back with c/o of severe pruritus Kenalog cream was ordered to relieve pruritus

## 2022-09-12 NOTE — Assessment & Plan Note (Addendum)
Diagnosed since 2006 She is currently on Ocrevus 300 mg every six months She follows up with Dr. Krista Blue at [facility] She is getting treatments on 09/14/22

## 2022-09-12 NOTE — Assessment & Plan Note (Signed)
Cymbalta 60 mg PRN feelsing well  Denies SI/HI   PHQ-9 is 6

## 2022-09-12 NOTE — Assessment & Plan Note (Signed)
Controlled She denies headaches, dizziness, blurred vision, and chest pain She is stable on lisinopril 20 mg daily Encouraged to continue therapy

## 2022-09-12 NOTE — Assessment & Plan Note (Addendum)
She is following up with Dr. Luna Glasgow, and she was last seen on 09/05/22 Dr. Luna Glasgow reviewed imaging studies with the patient, noting that she may need surgery and to see Dr. Sharol Given She denies redness, swelling, and warmth of the affected foot Encouraged to f/u with Dr Luna Glasgow at needed

## 2022-09-12 NOTE — Assessment & Plan Note (Addendum)
Wt Readings from Last 3 Encounters:  09/12/22 266 lb (120.7 kg)  07/04/22 259 lb (117.5 kg)  06/02/22 259 lb 0.7 oz (117.5 kg)  She reports implementing healthier lifestyle choices and increasing her physical activities Recommended exercising at least three times a  week for 30 minutes

## 2022-09-12 NOTE — Progress Notes (Signed)
Established Patient Office Visit  Subjective:  Patient ID: Katrina Montes, female    DOB: [birth year]  Age: 51 y.o. MRN: [id number]  CC:  Chief Complaint  Patient presents with   Follow-up    Itching on back very bad since June 2023 comes and goes.had a left foot injury in July of 2023 .    HPI Katrina Montes is a 51 y.o. female with past medical history of hypertension, OSA, T2DM presents for f/u of  chronic medical conditions.  HTN: Controlled. She denies headaches, dizziness, blurred vision, and chest pain. She is stable on lisinopril 20 mg daily.   Hyperlipidemia: Stable on Lipitor 20 mg daily. She denies chest pain, SOB, and chest tightness.  Truncal acne: Small papules noted on her back with c/o of severe pruritus.   Fracture of the fourth metatarsal: she is following up with Dr. Hilda Lias, and she was last seen on 09/05/22. Dr. Hilda Lias reviewed imaging studies with the patient, noting that she may need surgery and to see Dr. Lajoyce Corners. She denies redness, swelling, and warmth of the affected foot.  Past Medical History:  Diagnosis Date   Anemia    Anxiety    Back pain    Carpal tunnel syndrome    Constipation    Depression    Diabetes mellitus    Diabetes mellitus, type II (HCC)    Hyperlipidemia    Hypertension    Joint pain    Lactose intolerance    Multiple sclerosis (HCC) 2016   Optic neuritis    Shortness of breath    Sleep apnea    Swallowing difficulty    Swelling of both lower extremities    Vision, loss, sudden    Vitamin D deficiency     Past Surgical History:  Procedure Laterality Date   BREAST REDUCTION SURGERY     COLONOSCOPY WITH PROPOFOL N/A 06/02/2022   Procedure: COLONOSCOPY WITH PROPOFOL;  Surgeon: Corbin Ade, MD;  Location: [facility];  Service: Endoscopy;  Laterality: N/A;  8:45AM   CYST EXCISION     INCISION AND DRAINAGE ABSCESS Left 12/23/2021   Procedure: INCISION AND DRAINAGE BUTTOCK ABSCESS;  Surgeon: Almond Lint,  MD;  Location: [facility] OR;  Service: General;  Laterality: Left;   ovarian cyst removed     TUBAL LIGATION     WISDOM TOOTH EXTRACTION      Family History  Problem Relation Age of Onset   Cancer Mother    Diabetes Mother    Hearing loss Mother    Thyroid nodules Mother    Anemia Mother    Kidney disease Mother    Hypertension Mother    Obesity Mother    Breast cancer Mother    Hyperlipidemia Father    High blood pressure Father    Sleep apnea Father    Pancreatic cancer Father    Anemia Sister    Anemia Sister    Arthritis Maternal Grandmother    Arthritis Maternal Grandfather    Arthritis Paternal Grandmother    Kidney disease Paternal Grandmother    Hypertension Paternal Grandmother    Arthritis Paternal Grandfather    Vision loss Paternal Grandfather    Stroke Paternal Grandfather    Hyperlipidemia Paternal Grandfather    Pancreatic cancer Maternal Uncle    Kidney disease Paternal Uncle    Colon cancer Neg Hx    Colon polyps Neg Hx     Social History   Socioeconomic History   Marital status: Single  Spouse name: Not on file   Number of children: 0   Years of education: College   Highest education level: Not on file  Occupational History   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   Right handed.   Social Determinants of Health   Financial Resource Strain: Low Risk  (02/21/2022)   Overall Financial Resource Strain (CARDIA)    Difficulty of Paying Living Expenses: Not very hard  Food Insecurity: No Food Insecurity (02/21/2022)   Hunger Vital Sign    Worried About Running Out of Food in the Last Year: Never true    Ran Out of Food in the Last Year: Never true  Transportation Needs:  No Transportation Needs (02/21/2022)   PRAPARE - Hydrologist (Medical): No    Lack of Transportation (Non-Medical): No  Physical Activity: Insufficiently Active (02/21/2022)   Exercise Vital Sign    Days of Exercise per Week: 3 days    Minutes of Exercise per Session: 30 min  Stress: No Stress Concern Present (02/21/2022)   [place]    Feeling of Stress : Not at all  Social Connections: Moderately Integrated (02/21/2022)   Social Connection and Isolation Panel [NHANES]    Frequency of Communication with Friends and Family: Twice a week    Frequency of Social Gatherings with Friends and Family: Twice a week    Attends Religious Services: 1 to 4 times per year    Active Member of [employer] or Organizations: Yes    Attends Archivist Meetings: Never    Marital Status: Never married  Intimate Partner Violence: Not At Risk (02/21/2022)   Humiliation, Afraid, Rape, and Kick questionnaire    Fear of Current or Ex-Partner: No    Emotionally Abused: No    Physically Abused: No    Sexually Abused: No    Outpatient Medications Prior to Visit  Medication Sig Dispense Refill   APPLE CIDER VINEGAR PO Take 1 tablet by mouth daily.     atorvastatin (LIPITOR) 20 MG tablet Take 1 tablet (20 mg total) by mouth daily. 90 tablet 3   Cholecalciferol (VITAMIN D3) 125 MCG (5000 UT) CAPS Take 1 capsule (5,000 Units total) by mouth daily. 90 capsule 1   cyclobenzaprine (FLEXERIL) 10 MG tablet Take 1 tablet (10 mg total) by mouth 2 (two) times daily as needed for muscle spasms. 30 tablet 3   diphenhydrAMINE (BENADRYL) 25 MG tablet Take 25 mg by mouth every 6 (six) hours as needed for allergies.     docusate sodium (COLACE) 50 MG capsule Take 1 capsule (50 mg total) by mouth 2 (two) times daily. (Patient taking differently: Take 50 mg by mouth daily as needed for moderate constipation.) 60 capsule 11   Dulaglutide  (TRULICITY) 3 GB/1.5VV SOPN Inject 3 mg as directed once a week. 2 mL 2   DULoxetine (CYMBALTA) 60 MG capsule Take 1 capsule (60 mg total) by mouth daily. 90 capsule 3   ELDERBERRY PO Take 1,150 mg by mouth daily.     ferrous sulfate (EQL SLOW RELEASE IRON) 160 (50 Fe) MG  TBCR SR tablet Take 1 tablet (160 mg total) by mouth daily. 30 tablet 3   gabapentin (NEURONTIN) 100 MG capsule Take 1 capsule (100 mg total) by mouth 3 (three) times daily. 90 capsule 3   HYDROcodone-acetaminophen (NORCO/VICODIN) 5-325 MG tablet One tablet every four hours as needed for acute pain.  Limit of five days per [place] statue. 30 tablet 0   ibuprofen (ADVIL,MOTRIN) 200 MG tablet Take 200 mg by mouth as needed for headache, moderate pain or mild pain.     lisinopril (ZESTRIL) 20 MG tablet Take 1 tablet (20 mg total) by mouth daily. 30 tablet 11   meclizine (ANTIVERT) 25 MG tablet Take 1 tablet (25 mg total) by mouth 3 (three) times daily as needed for dizziness. 30 tablet 6   metFORMIN (GLUCOPHAGE) 500 MG tablet Take 1 tablet (500 mg total) by mouth 2 (two) times daily with a meal. 60 tablet 11   Multiple Vitamin (MULTIVITAMIN) capsule Take 1 capsule by mouth daily.     ocrelizumab (OCREVUS) 300 MG/10ML injection Inject 300 mg into the vein every 6 (six) months.     oxyCODONE-acetaminophen (PERCOCET/ROXICET) 5-325 MG tablet Take 1 tablet by mouth every 12 (twelve) hours. 10 tablet 0   polyethylene glycol powder (GLYCOLAX/MIRALAX) 17 GM/SCOOP powder Take 1 capful (17 g) in water by mouth daily. (Patient taking differently: Take 17 g by mouth daily as needed for moderate constipation.) 238 g 0   polyethylene glycol-electrolytes (NULYTELY) 420 g solution As directed 4000 mL 0   PRESCRIPTION MEDICATION Place 1 drop into both eyes at bedtime.     rizatriptan (MAXALT) 10 MG tablet Take 1 tablet (10 mg total) by mouth as needed for migraine. May repeat in 2 hours if needed. Max dose: 2/24 hr or 15/30 days. 45 tablet 3    topiramate (TOPAMAX) 100 MG tablet Take 1 tablet (100 mg total) by mouth at bedtime. 90 tablet 4   No facility-administered medications prior to visit.    No Known Allergies  ROS Review of Systems  Constitutional:  Negative for chills, fatigue and fever.  Eyes:  Negative for visual disturbance.  Respiratory:  Negative for chest tightness and shortness of breath.   Cardiovascular:  Negative for chest pain and palpitations.  Gastrointestinal:  Negative for constipation, diarrhea, nausea and vomiting.  Genitourinary:  Negative for dysuria, frequency and urgency.  Skin:  Positive for rash.  Neurological:  Negative for dizziness and headaches.  Psychiatric/Behavioral:  Negative for self-injury and suicidal ideas.       Objective:    Physical Exam Constitutional:      Appearance: She is obese.  HENT:     Head: Normocephalic.     Nose: No congestion.  Cardiovascular:     Rate and Rhythm: Normal rate and regular rhythm.     Pulses: Normal pulses.     Heart sounds: Normal heart sounds.  Pulmonary:     Effort: Pulmonary effort is normal.     Breath sounds: Normal breath sounds.  Skin:    Findings: Rash present.     Comments: Small papules noted on the back  Neurological:     Mental Status: She is alert and oriented to person, place, and time.     BP 130/83 (BP Location: Right Arm, Patient Position: Sitting)   Pulse 88   Ht $R'5\' 6"'fR$  (1.676 m)   Wt 266 lb (120.7 kg)   SpO2 98%   BMI 42.93 kg/m  Wt Readings from Last 3 Encounters:  09/12/22 266 lb (120.7 kg)  07/04/22 259 lb (117.5 kg)  06/02/22 259 lb 0.7 oz (117.5 kg)    Lab Results  Component Value Date   TSH 0.900 01/27/2022   Lab Results  Component Value Date   WBC 5.2 03/28/2022   HGB 11.6 03/28/2022   HCT 35.5 03/28/2022   MCV 82 03/28/2022   PLT 402 03/28/2022   Lab Results  Component Value Date   NA 139 05/09/2022   K 4.6 05/09/2022   CO2 20 05/09/2022   GLUCOSE 112 (H) 05/09/2022   BUN 10  05/09/2022   CREATININE 0.64 05/09/2022   BILITOT 0.5 05/09/2022   ALKPHOS 86 05/09/2022   AST 12 05/09/2022   ALT 12 05/09/2022   PROT 7.4 05/09/2022   ALBUMIN 4.2 05/09/2022   CALCIUM 9.3 05/09/2022   ANIONGAP 7 12/26/2021   EGFR 108 05/09/2022   Lab Results  Component Value Date   CHOL 177 05/09/2022   Lab Results  Component Value Date   HDL 79 05/09/2022   Lab Results  Component Value Date   LDLCALC 85 05/09/2022   Lab Results  Component Value Date   TRIG 69 05/09/2022   Lab Results  Component Value Date   CHOLHDL 2.2 05/09/2022   Lab Results  Component Value Date   HGBA1C 7.3 (A) 07/20/2022      Assessment & Plan:   Problem List Items Addressed This Visit       Cardiovascular and Mediastinum   Essential hypertension, benign    Controlled She denies headaches, dizziness, blurred vision, and chest pain She is stable on lisinopril 20 mg daily Encouraged to continue therapy        Endocrine   Uncontrolled type 2 diabetes mellitus with hyperglycemia ([place])    She denise the 3ps of  diabetes She is following up with Dr.Nida She is stable on metformin and trulicity Lab Results  Component Value Date   HGBA1C 7.3 (A) 07/20/2022        Relevant Orders   CBC with Differential/Platelet   CMP14+EGFR   TSH + free T4   Lipid Profile     Nervous and Auditory   Multiple sclerosis ([place])    Diagnosed since 2006 She is currently on Ocrevus 300 mg every six months She follows up with Dr. Krista [facility] at [facility] She is getting treatments on 09/14/22         Musculoskeletal and Integument   Acne vulgaris    Small papules were noted on her back with c/o of severe pruritus Kenalog cream was ordered to relieve pruritus      Fracture of fourth metatarsal bone of left foot with delayed healing - Primary    She is following up with Dr. Luna Glasgow, and she was last seen on 09/05/22 Dr. Luna Glasgow reviewed imaging studies with the patient, noting that  she may need surgery and to see Dr. Sharol Given She denies redness, swelling, and warmth of the affected foot Encouraged to f/u with Dr Luna Glasgow at needed        Other   Vitamin D deficiency   Relevant Orders   Vitamin D (25 hydroxy)   Morbid obesity ([place])    Wt Readings from Last 3 Encounters:  09/12/22 266 lb (120.7 kg)  07/04/22 259 lb (117.5 kg)  06/02/22 259 lb 0.7 oz (117.5 kg)  She reports implementing healthier lifestyle choices and increasing her physical activities Recommended exercising at least three times a  week for 30 minutes  Depression    Cymbalta 60 mg PRN feelsing well  Denies SI/HI   PHQ-9 is 6       Other Visit Diagnoses     Rash       Relevant Medications   triamcinolone cream (KENALOG) 0.1 %   Influenza vaccine refused           Meds ordered this encounter  Medications   triamcinolone cream (KENALOG) 0.1 %    Sig: Apply 1 Application topically 2 (two) times daily.    Dispense:  30 g    Refill:  0    Follow-up: Return in about 4 months (around 01/12/2023).    Alvira Monday, FNP

## 2022-09-12 NOTE — Assessment & Plan Note (Addendum)
She denise the 3ps of  diabetes She is following up with Dr.Nida She is stable on metformin and trulicity Lab Results  Component Value Date   HGBA1C 7.3 (A) 07/20/2022

## 2022-09-12 NOTE — Patient Instructions (Signed)
I appreciate the opportunity to provide care to you today!    Follow up:  4 months  Labs: please stop by the lab today to get your blood drawn (CBC, CMP, TSH, Lipid profile, Vit D)     Please continue to a heart-healthy diet and increase your physical activities. Try to exercise for 40mins at least three times a week.      It was a pleasure to see you and I look forward to continuing to work together on your health and well-being. Please do not hesitate to call the office if you need care or have questions about your care.   Have a wonderful day and week. With Gratitude, Alvira Monday MSN, FNP-BC

## 2022-09-13 ENCOUNTER — Other Ambulatory Visit: Payer: Self-pay | Admitting: Family Medicine

## 2022-09-13 DIAGNOSIS — E782 Mixed hyperlipidemia: Secondary | ICD-10-CM

## 2022-09-13 DIAGNOSIS — E559 Vitamin D deficiency, unspecified: Secondary | ICD-10-CM

## 2022-09-13 LAB — CBC WITH DIFFERENTIAL/PLATELET
Basophils Absolute: 0 10*3/uL (ref 0.0–0.2)
Basos: 1 %
EOS (ABSOLUTE): 0 10*3/uL (ref 0.0–0.4)
Eos: 1 %
Hematocrit: 34.9 % (ref 34.0–46.6)
Hemoglobin: 11.2 g/dL (ref 11.1–15.9)
Immature Grans (Abs): 0 10*3/uL (ref 0.0–0.1)
Immature Granulocytes: 0 %
Lymphocytes Absolute: 1.3 10*3/uL (ref 0.7–3.1)
Lymphs: 23 %
MCH: 26.1 pg — ABNORMAL LOW (ref 26.6–33.0)
MCHC: 32.1 g/dL (ref 31.5–35.7)
MCV: 81 fL (ref 79–97)
Monocytes Absolute: 0.5 10*3/uL (ref 0.1–0.9)
Monocytes: 9 %
Neutrophils Absolute: 3.8 10*3/uL (ref 1.4–7.0)
Neutrophils: 66 %
Platelets: 398 10*3/uL (ref 150–450)
RBC: 4.29 x10E6/uL (ref 3.77–5.28)
RDW: 14.9 % (ref 11.7–15.4)
WBC: 5.7 10*3/uL (ref 3.4–10.8)

## 2022-09-13 LAB — CMP14+EGFR
ALT: 9 IU/L (ref 0–32)
AST: 10 IU/L (ref 0–40)
Albumin/Globulin Ratio: 1.3 (ref 1.2–2.2)
Albumin: 4.1 g/dL (ref 3.9–4.9)
Alkaline Phosphatase: 83 IU/L (ref 44–121)
BUN/Creatinine Ratio: 18 (ref 9–23)
BUN: 14 mg/dL (ref 6–24)
Bilirubin Total: 0.3 mg/dL (ref 0.0–1.2)
CO2: 17 mmol/L — ABNORMAL LOW (ref 20–29)
Calcium: 9.4 mg/dL (ref 8.7–10.2)
Chloride: 107 mmol/L — ABNORMAL HIGH (ref 96–106)
Creatinine, Ser: 0.79 mg/dL (ref 0.57–1.00)
Globulin, Total: 3.1 g/dL (ref 1.5–4.5)
Glucose: 148 mg/dL — ABNORMAL HIGH (ref 70–99)
Potassium: 4.9 mmol/L (ref 3.5–5.2)
Sodium: 139 mmol/L (ref 134–144)
Total Protein: 7.2 g/dL (ref 6.0–8.5)
eGFR: 92 mL/min/{1.73_m2} (ref >59)

## 2022-09-13 LAB — LIPID PANEL
Chol/HDL Ratio: 2.8 ratio (ref 0.0–4.4)
Cholesterol, Total: 222 mg/dL — ABNORMAL HIGH (ref 100–199)
HDL: 80 mg/dL (ref >39)
LDL Chol Calc (NIH): 124 mg/dL — ABNORMAL HIGH (ref 0–99)
Triglycerides: 103 mg/dL (ref 0–149)
VLDL Cholesterol Cal: 18 mg/dL (ref 5–40)

## 2022-09-13 LAB — VITAMIN D 25 HYDROXY (VIT D DEFICIENCY, FRACTURES): Vit D, 25-Hydroxy: 19 ng/mL — ABNORMAL LOW (ref 30.0–100.0)

## 2022-09-13 LAB — TSH+FREE T4
Free T4: 0.94 ng/dL (ref 0.82–1.77)
TSH: 1.58 u[IU]/mL (ref 0.450–4.500)

## 2022-09-13 MED ORDER — ATORVASTATIN CALCIUM 40 MG PO TABS: 40.0000 mg | ORAL_TABLET | Freq: Every day | ORAL | 3 refills | Status: DC

## 2022-09-13 MED ORDER — VITAMIN D (ERGOCALCIFEROL) 1.25 MG (50000 UNIT) PO CAPS: 50000.0000 [IU] | ORAL_CAPSULE | ORAL | 2 refills | Status: DC

## 2022-09-13 NOTE — Progress Notes (Signed)
Please inform the patient that I've increased her lipitor to 40 mg. The new prescription is sent to her pharmacy. I've also sent a prescription for Vit D to her pharmacy; her vitamin D is low.

## 2022-09-13 NOTE — Progress Notes (Signed)
The 10-year ASCVD risk score (Arnett DK, et al., 2019) is: 9.9%   Values used to calculate the score:     Age: 51 years     Sex: Female     Is Non-Hispanic African American: Yes     Diabetic: Yes     Tobacco smoker: Yes     Systolic Blood Pressure: 226 mmHg     Is BP treated: Yes     HDL Cholesterol: 80 mg/dL     Total Cholesterol: 222 mg/dL

## 2022-09-14 DIAGNOSIS — G35 Multiple sclerosis: Secondary | ICD-10-CM | POA: Diagnosis not present

## 2022-09-19 ENCOUNTER — Encounter: Payer: Self-pay | Admitting: Neurology

## 2022-09-26 ENCOUNTER — Ambulatory Visit: Payer: Medicare Other | Admitting: Neurology

## 2022-09-26 ENCOUNTER — Encounter: Payer: Self-pay | Admitting: Neurology

## 2022-09-26 VITALS — BP 127/79 | HR 86 | Ht 66.0 in | Wt 266.0 lb

## 2022-09-26 DIAGNOSIS — G43009 Migraine without aura, not intractable, without status migrainosus: Secondary | ICD-10-CM

## 2022-09-26 DIAGNOSIS — G35 Multiple sclerosis: Secondary | ICD-10-CM

## 2022-09-26 DIAGNOSIS — R5383 Other fatigue: Secondary | ICD-10-CM

## 2022-09-26 DIAGNOSIS — G4733 Obstructive sleep apnea (adult) (pediatric): Secondary | ICD-10-CM

## 2022-09-26 MED ORDER — DULOXETINE HCL 60 MG PO CPEP: 60.0000 mg | ORAL_CAPSULE | Freq: Every day | ORAL | 3 refills | Status: AC

## 2022-09-26 MED ORDER — RIZATRIPTAN BENZOATE 10 MG PO TABS: 10.0000 mg | ORAL_TABLET | ORAL | 3 refills | Status: DC | PRN

## 2022-09-26 MED ORDER — TOPIRAMATE 100 MG PO TABS: 100.0000 mg | ORAL_TABLET | Freq: Every day | ORAL | 4 refills | Status: DC

## 2022-09-26 NOTE — Patient Instructions (Signed)
Continue current medications Check IgG IgA IgM I will order in lab sleep study

## 2022-09-26 NOTE — Progress Notes (Signed)
PATIENT: Katrina Montes DOB: [birth year]  REASON FOR VISIT: Follow up for MS HISTORY FROM: Patient PRIMARY NEUROLOGIST: Dr. Krista Blue   HISTORY   Katrina Montes is 51 right-handed African-American female, accompanied by her mother, referred by her primary care physician Dr. Buelah Manis for evaluation of right optic neuritis, abnormal MRI of the brain, suspicious for relapsing remitting multiple sclerosis, initial evaluation was on Jan 14 2015.   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   Relapsing Remitting Multiple Sclerosis was diagnosed since January 2016: Based on her abnormal MRI of the brain, there was also cervical spinal cord involvement   MRI of the brain without contrast at [facility] January 2016 showed multiple periventricular white matter disease, significant involvement of corpus callosum, perpendicular to ventricle, consistent with multiple sclerosis, now was also suspicious for right optic nerve edema, with hyperintensity signal at T2, FLAIR, no contrast enhancement, MRA of brain was normal.   MRI of the cervical spine with and without contrast in Jan 2016: showing hyperintense foci within the spinal cord posteriorly at C4 and posteriolaterally at C7-T1 consistent with multiple sclerosis plaques. There were no enhancing foci.   Spinal fluid testing January 13 2015, total protein was 708, RBC was 1600, RBC was 7,, cloudy reddish-looking, elevated IgG 17.7, IgG index 0.97, IgG synthetic rate 238, 0  oligoclonal bands,   Visual evoked potential showed prolongation on the right side.   She was treated with IV Solu-Medrol for 3 days in Jan 2016, which only mild improved her right vision, has significant worsening of her blood glucose level.She was treated with Achtar injection in Feb 2016, but still having significant residual visual difficulty.   JC virus was positive with titer of 1.1 3 (Jan 14 2015),  laboratory evaluation showed normal or negative CPK, HIV, protein electrophoresis, positive varicella-zoster virus antibody, mild elevated ESR, C-reactive protein, hepatitis panel, Lyme titer, showed anemia, hemoglobin 11.8, otherwise normal CBC, normal CMP, with exception of elevated glucose, normal TSH,   She was treated with Tysarbril since March 14th, 2016, was switched to ocrelizumab since February 2020   MRI of the brain with and without contrast in April 2019 showed multiple supratentorium lesions, no contrast enhancement, no significant change compared to previous MRI of the brain   Vision: History of right optic neuritis, with partial recovery, now complains of blurry vision, difficult to read anything without bleeders or magnified glasses, difficult to read through computer screen or watch TV, visual acuity, OS 20/40, OD 20/50-1   Cognitive impairment: has to take frequent notes, difficulty to concentrate, to express herself.   Gait abnormality: Treated easily, generalized weakness, also complicated by her low back pain, left sided radicular pain,   Spasticity: Bilateral lower extremity spasticity, is taking gabapentin 300 mg 3 times a day previously tried and failed baclofen   Fatigue: Severe fatigue, also complicated by her depression anxiety chronic insomnia, body achy pain, limit her daily activity more than 50%, she has heavy menstruation-like cycle, chronic anemia,  recent hemoglobin was 10.6.   Chronic migraine: She also has history of chronic migraine headaches, retro-orbital  area severe pounding headache with associated light noise sensitivity, and happened frequently, up to 3 to four times each week, Maxalt as needed was helpful,   Severe low back pain, radiating pain to left lower extremity,  MRI of lumbar in July 201 there is minimum anterolisthesis of L4 upon L5, due to severe facet hypertrophy, associated with left ligamentum flavum hypertrophy and disc protrusion, there is moderate  Left foraminal and lateral recess stenosis, potential for left L4-5 nerve root compression.   Diabetic peripheral neuropathy, Progressive worsening bilateral feet paresthesia, now taking insulin   Depression anxiety, chronic insomnia, polypharmacy treatment:  Trazodone 50 mg every night, gabapentin 300 mg 3 times a day, Topamax 100 mg twice a day, Flexeril as needed   Weakness in hands: EMG nerve conduction study from outside clinic confirmed the diagnosis of severe right median neuropathy,   Urinary frequency: stable Hearing Loss-she reports occasional ringing in her ears, notices her hearing is not as good as it used to be, having to read lips, this is embarrassing to her   Mild obstructive sleep apnea, using CPAP machine,   Vitamin D deficiency: Vitamin D level was 23, on supplement     Update 15 2022 She is tolerating ocrelizumab, today alone at visit, constellation of complaints, has been out of her insulin due to financial concerns, last A1c December 2021 was 12.3, continue complains of fatigue, depression, gait abnormality, frequent migraine headaches, mild improvement in her low back pain, there is no flareup of her MS symptoms, urinary urgency, mild gait abnormalities   We personally reviewed most recent MRI of the brain with without contrast in February 2021: Multiple T2/FLAIR hyperintensity in the hemisphere, left thalamus, brainstem, none of this is acute.  No contrast-enhancement     Update Feb 02, 2022 SS: Here today alone, we spent a lot of time discussing her  medications, hospitalized January 5 for 9 days related to hyperglycemia, gluteal abscess that needed surgical drainage, A1c was 12.4, ran out of insulin, had financial hardships, tearful recounting the hard time she has had. Just now starting to feel better, getting out.  Establish with new PCP last week, gabapentin was restarted her neuropathy pain, left hip pain.  No longer taking nortriptyline or Effexor.  Is not sure about Cymbalta?  Remains on Topamax, Maxalt, migraines doing well. MS overall stable, Ocrevus is due in late March 2023 at our office. Does not wearing off effect 1 month before next infusion.  On vitamin D OTC. CPAP has come in but hasn't started yet.  Reviewed recent labs 01/27/22, HGB 11, creatinine 0.67, TSH 0.900, vitamin D 18.7  Update September 26, 2022 SS: MRI of the brain with and without contrast in March 2023 was overall stable with supratentorial and infratentorial plaques, no change from February 2021. Had Ocrevus on 09/15/22, feels taking longer to get the burst of energy. Has fatigue, chronic.   Recent labs from PCP 09/12/22 vitamin D 19, cholesterol 222, LDL 124, TSH 1.580, glucose 148.  A1c was 7.3 07/20/22.  On prescription strength Vit D.   She never picked up CPAP. Has lost about 20 lbs since 2020. Thinks she snores, sleeps alone. Chronic insomnia. Is restless. Daytime drowsiness. Often take melatonin or pain reliever to help her sleep.  Baseline sleep study June 2020 showed overall mild OSA, but much more pronounced during REM sleep.  Recommended she try AutoPap.  Today, ESS 18, FSS 49.  Migraines doing okay, few more lately, on Topamax, Maxalt, takes once a week.   1 fall few months ago, injured left foot.   REVIEW OF SYSTEMS: Out of a complete 14 system review of symptoms, the patient complains only of the following symptoms, and all other reviewed systems are negative.  See HPI  ALLERGIES: No Known Allergies  HOME MEDICATIONS: Outpatient Medications Prior to  Visit  Medication Sig Dispense Refill   APPLE CIDER VINEGAR PO Take 1 tablet by mouth daily.     atorvastatin (LIPITOR) 40 MG tablet Take 1 tablet (40 mg total) by mouth daily. 90 tablet 3   cyclobenzaprine (FLEXERIL) 10 MG tablet Take 1 tablet (10 mg total) by mouth 2 (two) times daily as needed for muscle spasms. 30 tablet 3   diphenhydrAMINE (BENADRYL) 25 MG tablet Take 25 mg by mouth every 6 (six) hours as needed for allergies.     docusate sodium (COLACE) 50 MG capsule Take 1 capsule (50 mg total) by mouth 2 (two) times daily. (Patient taking differently: Take 50 mg by mouth daily as needed for moderate constipation.) 60 capsule 11   Dulaglutide (TRULICITY) 3 WL/8.9HT SOPN Inject 3 mg as directed once a week. 2 mL 2   DULoxetine (CYMBALTA) 60 MG capsule Take 1 capsule (60 mg total) by mouth daily. 90 capsule 3   ELDERBERRY PO Take 1,150 mg by mouth daily.     ferrous sulfate (EQL SLOW RELEASE IRON) 160 (50 Fe) MG TBCR SR tablet Take 1 tablet (160 mg total) by mouth daily. 30 tablet 3   gabapentin (NEURONTIN) 100 MG capsule Take 1 capsule (100 mg total) by mouth 3 (three) times daily. 90 capsule 3   HYDROcodone-acetaminophen (NORCO/VICODIN) 5-325 MG tablet One tablet every four hours as needed for acute pain.  Limit of five days per [place]. 30 tablet 0   ibuprofen (ADVIL,MOTRIN) 200 MG tablet Take 200 mg by mouth as needed for headache, moderate pain or mild pain.     lisinopril (ZESTRIL) 20 MG tablet Take 1 tablet (20 mg total) by mouth daily. 30 tablet 11   meclizine (ANTIVERT) 25 MG tablet Take 1 tablet (25 mg total) by mouth 3 (three) times daily as needed for dizziness. 30 tablet 6   metFORMIN (GLUCOPHAGE) 500 MG tablet Take 1 tablet (500 mg total) by mouth 2 (two) times daily with a meal. 60 tablet 11   Multiple Vitamin (MULTIVITAMIN) capsule Take 1 capsule by mouth daily.     ocrelizumab (OCREVUS) 300 MG/10ML injection Inject 300 mg into the vein every 6 (six) months.      oxyCODONE-acetaminophen (PERCOCET/ROXICET) 5-325 MG tablet Take 1 tablet by mouth every 12 (twelve) hours. 10 tablet 0   polyethylene glycol powder (GLYCOLAX/MIRALAX) 17 GM/SCOOP powder Take 1 capful (17 g) in water by mouth daily. (Patient taking differently: Take 17 g by mouth daily as needed for moderate constipation.) 238 g 0   polyethylene glycol-electrolytes (NULYTELY) 420 g solution As directed 4000 mL 0   PRESCRIPTION MEDICATION Place 1 drop into both eyes at bedtime.     rizatriptan (MAXALT) 10 MG tablet Take 1 tablet (10 mg total) by mouth as needed for migraine. May repeat in 2 hours if needed. Max dose: 2/24 hr or 15/30 days. 45 tablet 3   topiramate (TOPAMAX) 100 MG tablet Take 1 tablet (100 mg total) by mouth at bedtime. 90 tablet 4   triamcinolone cream (KENALOG) 0.1 % Apply 1  Application topically 2 (two) times daily. 30 g 0   Vitamin D, Ergocalciferol, (DRISDOL) 1.25 MG (50000 UNIT) CAPS capsule Take 1 capsule (50,000 Units total) by mouth every 7 (seven) days. 5 capsule 2   No facility-administered medications prior to visit.    PAST MEDICAL HISTORY: Past Medical History:  Diagnosis Date   Anemia    Anxiety    Back pain    Carpal tunnel syndrome    Constipation    Depression    Diabetes mellitus    Diabetes mellitus, type II (HCC)    Hyperlipidemia    Hypertension    Joint pain    Lactose intolerance    Multiple sclerosis ([place]) 2016   Optic neuritis    Shortness of breath    Sleep apnea    Swallowing difficulty    Swelling of both lower extremities    Vision, loss, sudden    Vitamin D deficiency     PAST SURGICAL HISTORY: Past Surgical History:  Procedure Laterality Date   BREAST REDUCTION SURGERY     COLONOSCOPY WITH PROPOFOL N/A 06/02/2022   Procedure: COLONOSCOPY WITH PROPOFOL;  Surgeon: Daneil Dolin, MD;  Location: [facility];  Service: Endoscopy;  Laterality: N/A;  8:45AM   CYST EXCISION     INCISION AND DRAINAGE ABSCESS Left 12/23/2021    Procedure: INCISION AND DRAINAGE BUTTOCK ABSCESS;  Surgeon: Stark Klein, MD;  Location: [facility];  Service: General;  Laterality: Left;   ovarian cyst removed     TUBAL LIGATION     WISDOM TOOTH EXTRACTION      FAMILY HISTORY: Family History  Problem Relation Age of Onset   Cancer Mother    Diabetes Mother    Hearing loss Mother    Thyroid nodules Mother    Anemia Mother    Kidney disease Mother    Hypertension Mother    Obesity Mother    Breast cancer Mother    Hyperlipidemia Father    High blood pressure Father    Sleep apnea Father    Pancreatic cancer Father    Anemia Sister    Anemia Sister    Arthritis Maternal Grandmother    Arthritis Maternal Grandfather    Arthritis Paternal Grandmother    Kidney disease Paternal Grandmother    Hypertension Paternal Grandmother    Arthritis Paternal Grandfather    Vision loss Paternal Grandfather    Stroke Paternal Grandfather    Hyperlipidemia Paternal Grandfather    Pancreatic cancer Maternal Uncle    Kidney disease Paternal Uncle    Colon cancer Neg Hx    Colon polyps Neg Hx     SOCIAL HISTORY: Social History   Socioeconomic History   Marital status: Single    Spouse name: Not on file   Number of children: 0   Years of education: College   Highest education level: Not on file  Occupational History   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   Right handed.   Social Determinants of Health   Financial Resource Strain: Low Risk  (02/21/2022)   Overall Financial Resource Strain (CARDIA)    Difficulty of Paying Living  Expenses: Not very hard  Food Insecurity: No Food Insecurity (02/21/2022)   Hunger Vital Sign    Worried About Running Out  of Food in the Last Year: Never true    Ran Out of Food in the Last Year: Never true  Transportation Needs: No Transportation Needs (02/21/2022)   PRAPARE - Hydrologist (Medical): No    Lack of Transportation (Non-Medical): No  Physical Activity: Insufficiently Active (02/21/2022)   Exercise Vital Sign    Days of Exercise per Week: 3 days    Minutes of Exercise per Session: 30 min  Stress: No Stress Concern Present (02/21/2022)   [place]    Feeling of Stress : Not at all  Social Connections: Moderately Integrated (02/21/2022)   Social Connection and Isolation Panel [NHANES]    Frequency of Communication with Friends and Family: Twice a week    Frequency of Social Gatherings with Friends and Family: Twice a week    Attends Religious Services: 1 to 4 times per year    Active Member of [employer] or Organizations: Yes    Attends Archivist Meetings: Never    Marital Status: Never married  Intimate Partner Violence: Not At Risk (02/21/2022)   Humiliation, Afraid, Rape, and Kick questionnaire    Fear of Current or Ex-Partner: No    Emotionally Abused: No    Physically Abused: No    Sexually Abused: No   PHYSICAL EXAM  Vitals:   09/26/22 1103  BP: 127/79  Pulse: 86  Weight: 266 lb (120.7 kg)  Height: $Remove'5\' 6"'[id number]$  (1.676 m)   Body mass index is 42.93 kg/m.  Generalized: Well developed, in no acute distress  Neurological examination  Mentation: Alert oriented to time, place, history taking. Follows all commands speech and language fluent Cranial nerve II-XII: Pupils were equal round reactive to light. Extraocular movements were full, visual field were full on confrontational test. Facial sensation and strength were normal. Head turning and shoulder shrug  were normal and symmetric. Mallampati Class 3 Motor: Good strength all extremities, no muscle weakness was noted Sensory: Sensory testing is  intact to soft touch on all 4 extremities. No evidence of extinction is noted.  Coordination: Cerebellar testing reveals good finger-nose-finger and heel-to-shin bilaterally.  Gait and station: Has to push off from seated position to stand, gait is slightly wide-based, antalgic on the left Reflexes: Deep tendon reflexes are symmetric but decreased  DIAGNOSTIC DATA (LABS, IMAGING, TESTING) - I reviewed patient records, labs, notes, testing and imaging myself where available.  Lab Results  Component Value Date   WBC 5.7 09/12/2022   HGB 11.2 09/12/2022   HCT 34.9 09/12/2022   MCV 81 09/12/2022   PLT 398 09/12/2022      Component Value Date/Time   NA 139 09/12/2022 0843   K 4.9 09/12/2022 0843   CL 107 (H) 09/12/2022 0843   CO2 17 (L) 09/12/2022 0843   GLUCOSE 148 (H) 09/12/2022 0843   GLUCOSE 127 (H) 12/26/2021 0212   BUN 14 09/12/2022 0843   CREATININE 0.79 09/12/2022 0843   CREATININE 0.67 11/19/2020 0931   CALCIUM 9.4 09/12/2022 0843   PROT 7.2 09/12/2022 0843   ALBUMIN 4.1 09/12/2022 0843   AST 10 09/12/2022 0843   ALT 9 09/12/2022 0843   ALKPHOS 83 09/12/2022 0843   BILITOT 0.3 09/12/2022 0843   GFRNONAA >60 12/26/2021 0212   GFRAA 120 07/19/2020 0813   Lab Results  Component Value Date   CHOL 222 (H) 09/12/2022   HDL 80 09/12/2022   LDLCALC 124 (H) 09/12/2022   TRIG 103 09/12/2022   CHOLHDL 2.8 09/12/2022   Lab Results  Component Value Date   HGBA1C 7.3 (A) 07/20/2022   No results found for: "VITAMINB12" Lab Results  Component Value Date   TSH 1.580 09/12/2022   ASSESSMENT AND PLAN 51 y.o. year old female   1.  Relapsing remitting multiple sclerosis -Continue Ocrevus, recently had infusion end of September -Check IgG, IgA, IgM (reviewed recent CBC, CMP) -MRI of the brain with and without contrast March 2023 was overall stable with multiple supratentorial and infratentorial plaques  2.  Vitamin D deficiency -Recent level was 19, on prescription  strength now  3.  Mild OSA, daytime drowsiness, fatigue, ESS 18 -In the setting of 20 lb weight loss since 2020 sleep study -Reorder sleep study, will ask for in lab, patient prefers this  4.  Depression -On Cymbalta 60 mg daily  5.  Migraine headaches -Under good control, continue Topamax, Maxalt as needed  Next visit in 6 months with Dr. Krista Blue.  Butler Denmark, AGNP-C, DNP 09/26/2022, 11:36 AM [facility] [address], [place], [place] [address] [phone]

## 2022-09-27 LAB — IGG, IGA, IGM
IgA/Immunoglobulin A, Serum: 250 mg/dL (ref 87–352)
IgG (Immunoglobin G), Serum: 1589 mg/dL (ref 586–1602)
IgM (Immunoglobulin M), Srm: 22 mg/dL — ABNORMAL LOW (ref 26–217)

## 2022-10-02 ENCOUNTER — Telehealth: Payer: Self-pay | Admitting: Neurology

## 2022-10-02 NOTE — Telephone Encounter (Signed)
NPSG- [employer] medicare no auth req.  Patient is scheduled at [facility] for 11/05/22 at 8 pm.  Mailed packet to the patient.

## 2022-10-05 ENCOUNTER — Ambulatory Visit (INDEPENDENT_AMBULATORY_CARE_PROVIDER_SITE_OTHER): Payer: Medicare Other

## 2022-10-05 ENCOUNTER — Encounter: Payer: Self-pay | Admitting: Orthopaedic Surgery

## 2022-10-05 ENCOUNTER — Ambulatory Visit (INDEPENDENT_AMBULATORY_CARE_PROVIDER_SITE_OTHER): Payer: Medicare Other | Admitting: Orthopaedic Surgery

## 2022-10-05 DIAGNOSIS — S92352G Displaced fracture of fifth metatarsal bone, left foot, subsequent encounter for fracture with delayed healing: Secondary | ICD-10-CM

## 2022-10-05 NOTE — Progress Notes (Signed)
My foot is tender at times.  She has some pain at times of the left foot depending on her activity.  She has no new trauma.  X-rays were done of the left foot today, reported separately.  NV intact.  No swelling of left foot.  Encounter Diagnosis  Name Primary?   Closed displaced fracture of fifth metatarsal bone of left foot with delayed healing Yes   I will have her see Dr. Sharol Given for his evaluation of her nonunion.  She is agreeable to this.  Call if any problem.  Precautions discussed.  Electronically Signed Sanjuana Kava, MD 10/19/20239:03 AM

## 2022-10-11 ENCOUNTER — Ambulatory Visit (INDEPENDENT_AMBULATORY_CARE_PROVIDER_SITE_OTHER): Payer: Medicare Other

## 2022-10-11 DIAGNOSIS — Z23 Encounter for immunization: Secondary | ICD-10-CM

## 2022-10-12 ENCOUNTER — Ambulatory Visit: Payer: Medicare Other | Admitting: Orthopedic Surgery

## 2022-10-24 ENCOUNTER — Ambulatory Visit: Payer: Medicare Other | Admitting: Orthopedic Surgery

## 2022-10-29 ENCOUNTER — Other Ambulatory Visit: Payer: Self-pay | Admitting: Nurse Practitioner

## 2022-10-29 DIAGNOSIS — G629 Polyneuropathy, unspecified: Secondary | ICD-10-CM

## 2022-11-02 ENCOUNTER — Ambulatory Visit: Payer: Medicare Other | Admitting: Neurology

## 2022-11-02 ENCOUNTER — Other Ambulatory Visit: Payer: Self-pay | Admitting: Family Medicine

## 2022-11-02 DIAGNOSIS — G629 Polyneuropathy, unspecified: Secondary | ICD-10-CM

## 2022-11-02 MED ORDER — GABAPENTIN 100 MG PO CAPS: 100.0000 mg | ORAL_CAPSULE | Freq: Three times a day (TID) | ORAL | 3 refills | Status: DC

## 2022-11-02 NOTE — Telephone Encounter (Signed)
Prescription sent to her pharmacy

## 2022-11-05 ENCOUNTER — Ambulatory Visit (INDEPENDENT_AMBULATORY_CARE_PROVIDER_SITE_OTHER): Payer: Medicare Other | Admitting: Neurology

## 2022-11-05 DIAGNOSIS — G4733 Obstructive sleep apnea (adult) (pediatric): Secondary | ICD-10-CM | POA: Diagnosis not present

## 2022-11-05 DIAGNOSIS — R0683 Snoring: Secondary | ICD-10-CM

## 2022-11-05 DIAGNOSIS — R9431 Abnormal electrocardiogram [ECG] [EKG]: Secondary | ICD-10-CM

## 2022-11-05 DIAGNOSIS — G4720 Circadian rhythm sleep disorder, unspecified type: Secondary | ICD-10-CM

## 2022-11-05 DIAGNOSIS — R5383 Other fatigue: Secondary | ICD-10-CM

## 2022-11-08 NOTE — Procedures (Signed)
Physician Interpretation:     [facility] POLYSOMNOGRAPHY  INTERPRETATION REPORT   STUDY DATE:  11/05/2022     PATIENT NAME:  Katrina Montes         DATE OF BIRTH:  Apr 02, 1971  PATIENT ID:  [id number]    TYPE OF STUDY:  PSG  READING PHYSICIAN: Huston Foley, MD, PhD REFERRED BY: Margie Ege, NP SCORING TECHNICIAN: Margaretann Loveless, RPSGT  HISTORY:  51 year old right-handed woman with an underlying medical history of vitamin D deficiency, multiple sclerosis, hypertension, diabetes, depression, anxiety, anemia, and morbid obesity with BMI of over 40, who presents for re-evaluation of her obstructive sleep apnea. She was diagnosed with mild obstructive sleep apnea in the past but has not been on CPAP therapy. She has had interim weight loss. Height: 66 in Weight: 266 lb (BMI 42)  MEDICATIONS: Apple Cider Vinegar, Lipitor, Flexeril, Beandryl, Colace, Trulicity, Cymbalta, Elderberry, Ferrous Sulfate, Neurontin, Vicodin, Advil, Zestril, Antivert, Ferrous Glucophage, Multivitamin, Ocrevus, Percocet, Miralax, Nulytely, Maxalt, Topamax, Kenalog, Vitamin D TECHNICAL DESCRIPTION: A registered sleep technologist was in attendance for the duration of the recording.  Data collection, scoring, video monitoring, and reporting were performed in compliance with the AASM Manual for the Scoring of Sleep and Associated Events; (Hypopnea is scored based on the criteria listed in Section VIII D. 1b in the AASM Manual V2.6 using a 4% oxygen desaturation rule or Hypopnea is scored based on the criteria listed in Section VIII D. 1a in the AASM Manual V2.6 using 3% oxygen desaturation and /or arousal rule).   SLEEP CONTINUITY AND SLEEP ARCHITECTURE:  Lights-out was at 20:54: and lights-on at  05:02:, with a total recording time of 8 hours, 9 min. Total sleep time ( TST) was 311.0 minutes with a decreased sleep efficiency at 63.6%, which is reduced. BODY POSITION:  TST was divided  between the  following sleep positions: 84.9% supine;  15.1% lateral;  0% prone. Duration of total sleep and percent of total sleep in their respective position is as follows: supine 264 minutes (85%), non-supine 47 minutes (15%); right 47 minutes (15%), left 00 minutes (0%), and prone 00 minutes (0%).  Total supine REM sleep time was 54 minutes (100% of total REM sleep).  Sleep latency was increased at 115.0 minutes.  REM sleep latency was normal at 76.0 minutes. Of the total sleep time, the percentage of stage N1 sleep was 4.0%, stage N2 sleep was 73%, which is markedly increased, stage N3 sleep was 5.3%, which is reduced, and REM sleep was 17.5%, which is mildly reduced. Wake after sleep onset (WASO) time accounted for 62 minutes with mild to moderate sleep fragmentation noted.  RESPIRATORY MONITORING:  Based on CMS criteria (using a 4% oxygen desaturation rule for scoring hypopneas), there were 0 apneas (0 obstructive; 0 central; 0 mixed), and 21 hypopneas.  Apnea index was 0.0. Hypopnea index was 4.1. The apnea-hypopnea index was 4.1/hour overall (4.8 supine, 0 non-supine; 18.7 REM, 18.7 supine REM).  There were 0 respiratory effort-related arousals (RERAs).  The RERA index was 0 events/h. Total respiratory disturbance index (RDI) was 4.1 events/h. RDI results showed: supine RDI  4.8 /h; non-supine RDI 0.0 /h; REM RDI 18.7 /h, supine REM RDI 18.7 /h.   Based on AASM criteria (using a 3% oxygen desaturation and /or arousal rule for scoring hypopneas), there were 0 apneas (0 obstructive; 0 central; 0 mixed), and 21 hypopneas. Apnea index was 0.0. Hypopnea index was 4.1. The apnea-hypopnea index was 4.1 overall (4.8  supine, 0 non-supine; 18.7 REM, 18.7 supine REM).  There were 0 respiratory effort-related arousals (RERAs).  The RERA index was 0 events/h. Total respiratory disturbance index (RDI) was 4.1 events/h. RDI results showed: supine RDI  4.8 /h; non-supine RDI 0.0 /h; REM RDI 18.7 /h, supine REM RDI 18.7 /h.   OXIMETRY: Oxyhemoglobin Saturation Nadir during sleep was at  89%) from a mean of 98%.  Of the Total sleep time (TST)   hypoxemia (=<88%) was present for  0.0 minutes, or 0.0% of total sleep time.  LIMB MOVEMENTS: There were 59 periodic limb movements of sleep (11.4/hr), of which 0 (0.0/hr) were associated with an arousal. AROUSAL: There were 111 arousals in total, for an arousal index of 21 arousals/hour.  Of these, 9 were identified as respiratory-related arousals (2 /h), 0 were PLM-related arousals (0 /h), and 118 were non-specific arousals (23 /h). EEG: Review of the EEG showed no abnormal electrical discharges and symmetrical bihemispheric findings.   EKG: The EKG revealed normal sinus rhythm (NSR).  Occasional PVCs were noted. The average heart rate during sleep was 80 bpm.  AUDIO/VIDEO REVIEW: The audio and video review did not show any abnormal or unusual behaviors, movements, phonations or vocalizations. The patient took 1 bathroom break.  Snoring was in the moderate range, at times louder. POST-STUDY QUESTIONNAIRE: Post study, the patient indicated, that sleep was the same as usual. IMPRESSION:  1. Primary Snoring 2. Dysfunctions associated with sleep stages or arousal from sleep 3. Non-specific abnormal electrocardiogram (EKG) RECOMMENDATIONS:  1. This study does not demonstrate any significant obstructive or central sleep disordered breathing with a total AHI of less than 5/hour - her total AHI was 4/hour - and O2 nadir 89%. Moderate to loud snoring was noted. Treatment with a positive airway pressure device, such as CPAP or autoPAP is not indicated. For disturbing snoring, an oral appliance (through a qualified dentist) can be considered. Ongoing weight loss may aid in reducing her snoring.  2. This study shows mild to moderate sleep fragmentation and abnormal sleep stage percentages; these are nonspecific findings and per se do not signify an intrinsic sleep disorder or a cause for the  patient's sleep-related symptoms. Causes include (but are not limited to) the first night effect of the sleep study, circadian rhythm disturbances, medication effect or an underlying mood disorder or medical problem.  3. The study showed PVCs on single lead EKG; clinical correlation is recommended and consultation with cardiology may be feasible.  4. The patient should be cautioned not to drive, work at heights, or operate dangerous or heavy equipment when tired or sleepy. Review and reiteration of good sleep hygiene measures should be pursued with any patient. 5. The patient will be advised to follow up with the referring provider, who will be notified of the test results.   I certify that I have reviewed the entire raw data recording prior to the issuance of this report in accordance with the Standards of Accreditation of the American Academy of Sleep Medicine (AASM). Huston Foley, MD, PhD [facility]) Diplomat, ABPN (Neurology and Sleep)              Technical Report:   General Information  Name: Katrina Montes, Katrina Montes BMI: 00.93 Physician: Huston Foley, MD  ID: [id number] Height: 66.0 in Technician: Margaretann Loveless, RPSGT  Sex: Female Weight: 266.0 lb Record: [id number]  Age: 2 [17-May-1971] Date: 11/05/2022    Medical & Medication History    7 right-handed African-American female, accompanied by her  mother, referred by her primary care physician Dr. Jeanice Lim for evaluation of right optic neuritis, abnormal MRI of the brain, suspicious for relapsing remitting multiple sclerosis, initial evaluation was on Jan 14 2015. 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  The total APNEA/HYPOPNEA INDEX (AHI) was 8.3 /hour and the total RESPIRATORY DISTURBANCE INDEX was 8.3 /hour. 34 events occurred in REM sleep and 8 events in NREM. The  REM AHI was 40.4 /hour, versus a non-REM AHI of 1.3. The patient spent 255 minutes of total sleep time in the supine position and 26 minutes in non-supine.. The supine AHI was 6.1 versus a non-supine AHI of 30.6.   Apple Cider Vinegar, Lipitor, Flexeril, Beandryl, Colace, Trulicity, Cymbalta, Elderberry, Ferrous Sulfate, Neurontin, Vicodin, Advil, Zestril, Antivert, Ferrous Glucophage, Multivitamin, Ocrevus, Percocet, Miralax, Nulytely, Maxalt, Topamax, Kenalog, Vitamin D   Sleep Disorder      Comments   The patient came into the sleep lab for a PSG. She had a prior sleep study with our sleep lab in June 2020. The patient never started CPAP. She took gabapentin prior to sleep study. One restroom break. EKG kept in NSR with occasional PVC's. Moderate to loud snoring. PLM's witnessed. All respiratory events scored with a 4% desat. The patient slept supine and lateral. All sleep stages observed.     Lights out: 08:54:15 PM Lights on: 05:02:56 AM   Time Total Supine Side Prone Upright  Recording (TRT) 8h 9.80m 6h 39.18m 1h 29.42m 0h 0.17m 0h 0.39m  Sleep (TST) 5h 12.49m 4h 25.73m 0h 47.4m 0h 0.77m 0h 0.30m   Latency N1 N2 N3 REM Onset Per. Slp. Eff.  Actual 0h 0.27m 0h 0.43m 2h 32.29m 1h 16.72m 1h 55.40m 2h 27.28m 63.80%   Stg Dur Wake N1 N2 N3 REM  Total 177.0 12.5 227.5 16.5 54.5  Supine 134.5 10.0 183.0 16.5 54.5  Side 42.5 2.5 44.5 0.0 0.0  Prone 0.0 0.0 0.0 0.0 0.0  Upright 0.0 0.0 0.0 0.0 0.0   Stg % Wake N1 N2 N3 REM  Total 36.2 4.0 72.9 5.3 17.5  Supine 27.5 3.2 58.7 5.3 17.5  Side 8.7 0.8 14.3 0.0 0.0  Prone 0.0 0.0 0.0 0.0 0.0  Upright 0.0 0.0 0.0 0.0 0.0     Apnea Summary Sub Supine Side Prone Upright  Total 0 Total 0 0 0 0 0    REM 0 0 0 0 0    NREM 0 0 0 0 0  Obs 0 REM 0 0 0 0 0    NREM 0 0 0 0 0  Mix 0 REM 0 0 0 0 0    NREM 0 0 0 0 0  Cen 0 REM 0 0 0 0 0    NREM 0 0 0 0 0   Rera Summary Sub Supine Side Prone Upright  Total 0 Total 0 0 0 0 0    REM 0 0 0 0 0    NREM 0 0 0 0 0    Hypopnea Summary Sub Supine Side Prone Upright  Total 21 Total 21 21 0 0 0    REM 17 17 0 0 0    NREM 4 4 0 0 0   4% Hypopnea Summary Sub Supine Side Prone Upright  Total (4%) 21 Total 21 21 0 0 0  REM 17 17 0 0 0    NREM 4 4 0 0 0     AHI Total Obs Mix Cen  4.04 Apnea 0.00 0.00 0.00 0.00   Hypopnea 4.04 -- -- --  4.04 Hypopnea (4%) 4.04 -- -- --    Total Supine Side Prone Upright  Position AHI 4.04 4.75 0.00 0.00 0.00  REM AHI 18.72   NREM AHI 0.94   Position RDI 4.04 4.75 0.00 0.00 0.00  REM RDI 18.72   NREM RDI 0.94    4% Hypopnea Total Supine Side Prone Upright  Position AHI (4%) 4.04 4.75 0.00 0.00 0.00  REM AHI (4%) 18.72   NREM AHI (4%) 0.94   Position RDI (4%) 4.04 4.75 0.00 0.00 0.00  REM RDI (4%) 18.72   NREM RDI (4%) 0.94    Desaturation Information Threshold: 2% <100% <90% <80% <70% <60% <50% <40%  Supine 118.0 2.0 0.0 0.0 0.0 0.0 0.0  Side 23.0 0.0 0.0 0.0 0.0 0.0 0.0  Prone 0.0 0.0 0.0 0.0 0.0 0.0 0.0  Upright 0.0 0.0 0.0 0.0 0.0 0.0 0.0  Total 141.0 2.0 0.0 0.0 0.0 0.0 0.0  Index 22.7 0.3 0.0 0.0 0.0 0.0 0.0   Threshold: 3% <100% <90% <80% <70% <60% <50% <40%  Supine 51.0 2.0 0.0 0.0 0.0 0.0 0.0  Side 8.0 0.0 0.0 0.0 0.0 0.0 0.0  Prone 0.0 0.0 0.0 0.0 0.0 0.0 0.0  Upright 0.0 0.0 0.0 0.0 0.0 0.0 0.0  Total 59.0 2.0 0.0 0.0 0.0 0.0 0.0  Index 9.5 0.3 0.0 0.0 0.0 0.0 0.0   Threshold: 4% <100% <90% <80% <70% <60% <50% <40%  Supine 38.0 2.0 0.0 0.0 0.0 0.0 0.0  Side 4.0 0.0 0.0 0.0 0.0 0.0 0.0  Prone 0.0 0.0 0.0 0.0 0.0 0.0 0.0  Upright 0.0 0.0 0.0 0.0 0.0 0.0 0.0  Total 42.0 2.0 0.0 0.0 0.0 0.0 0.0  Index 6.8 0.3 0.0 0.0 0.0 0.0 0.0   Threshold: 3% <100% <90% <80% <70% <60% <50% <40%  Supine 51 2 0 0 0 0 0  Side 8 0 0 0 0 0 0  Prone 0 0 0 0 0 0 0  Upright 0 0 0 0 0 0 0  Total 59 2 0 0 0 0 0   Awakening/Arousal Information # of Awakenings 31  Wake after sleep onset 62.67m  Wake after persistent sleep 45.12m   Arousal Assoc. Arousals  Index  Apneas 0 0.0  Hypopneas 9 1.7  Leg Movements 0 0.0  Snore 0 0.0  PTT Arousals 0 0.0  Spontaneous 118 22.7  Total 127 24.4  Leg Movement Information PLMS LMs Index  Total LMs during PLMS 59 11.3  LMs w/ Microarousals 0 0.0   LM LMs Index  w/ Microarousal 0 0.0  w/ Awakening 0 0.0  w/ Resp Event 0 0.0  Spontaneous 41 7.9  Total 41 7.9     Desaturation threshold setting: 3% Minimum desaturation setting: 10 seconds SaO2 nadir: 80% The longest event was a 64 sec obstructive Hypopnea with a minimum SaO2 of 95%. The lowest SaO2 was 89% associated with a 40 sec obstructive Hypopnea. EKG Rates EKG Avg Max Min  Awake 82 121 64  Asleep 80 101 64  EKG Events: Tachycardia

## 2022-11-23 ENCOUNTER — Ambulatory Visit: Payer: Medicare Other | Admitting: "Endocrinology

## 2023-01-02 ENCOUNTER — Ambulatory Visit
Admission: EM | Admit: 2023-01-02 | Discharge: 2023-01-02 | Disposition: A | Discharge status: Home / Self Care | Payer: Medicare Other | Attending: Nurse Practitioner | Admitting: Nurse Practitioner

## 2023-01-02 DIAGNOSIS — L02212 Cutaneous abscess of back [any part, except buttock]: Secondary | ICD-10-CM

## 2023-01-02 MED ORDER — DOXYCYCLINE HYCLATE 100 MG PO CAPS: 100.0000 mg | ORAL_CAPSULE | Freq: Two times a day (BID) | ORAL | 0 refills | Status: AC

## 2023-01-02 MED ORDER — ACETAMINOPHEN 500 MG PO TABS
1000.0000 mg | ORAL_TABLET | Freq: Once | ORAL | Status: AC
  Administered 2023-01-02: 1000 mg via ORAL

## 2023-01-02 MED ORDER — CEPHALEXIN 500 MG PO CAPS: 500.0000 mg | ORAL_CAPSULE | Freq: Four times a day (QID) | ORAL | 0 refills | Status: DC

## 2023-01-02 NOTE — ED Triage Notes (Signed)
Pt reports she has pain in the back, nausea x 2 days. Pt think she may have a boil or rash in the area. Pt had ibuprofen 1 hr ago.

## 2023-01-02 NOTE — Discharge Instructions (Addendum)
I am concerned about the abscess in your back.  Please start both of the antibiotics (Keflex and doxycycline) and take them exactly as prescribed.  Continue changing the bandage on your abscess twice daily.  Please follow-up with [place] or your primary care provider within the next 48 hours to have a reevaluation of the abscess.  If your symptoms worsen in the meantime, go to the hospital or call 911.

## 2023-01-02 NOTE — ED Provider Notes (Signed)
[facility]    CSN: [id number] Arrival date & time: 01/02/23  1504      History   Chief Complaint Chief Complaint  Patient presents with   Rash    HPI Katrina Montes is a 52 y.o. female.   Patient presents today for pain to her mid back.  Reports she thought initially, there was a rash there and has been putting a cream on it that is prescribed at her primary care provider.  Reports now, the area is tender and very painful.  Is also draining.  She is concerned for an abscess.  Reports last year, she had an abscess and she was hospitalized for a few days because of it.  She endorses pain, drainage.  She also endorses fevers at home, nausea, however no vomiting.  Appetite has been decreased.  Patient denies antibiotic use in the past 90 days.  Patient does have a history of MRSA infection.  Patient also has a history of type 2 diabetes.  Reports Trulicity recently got titrated up.  Blood sugars have been in the 160s to 180s.    Past Medical History:  Diagnosis Date   Anemia    Anxiety    Back pain    Carpal tunnel syndrome    Constipation    Depression    Diabetes mellitus    Diabetes mellitus, type II (HCC)    Hyperlipidemia    Hypertension    Joint pain    Lactose intolerance    Multiple sclerosis (HCC) 2016   Optic neuritis    Shortness of breath    Sleep apnea    Swallowing difficulty    Swelling of both lower extremities    Vision, loss, sudden    Vitamin D deficiency     Patient Active Problem List   Diagnosis Date Noted   Acne vulgaris 09/12/2022   Fracture of fourth metatarsal bone of left foot with delayed healing 09/12/2022   Mild episode of recurrent major depressive disorder (HCC) 05/09/2022   Annual physical exam 03/28/2022   Diabetic ulcer of back (HCC) 03/28/2022   Mixed hyperlipidemia 02/13/2022   Uncontrolled type 2 diabetes mellitus with hyperglycemia (HCC) 01/27/2022   Screening for colon cancer 01/27/2022   Need for  immunization against influenza 01/27/2022   Neuropathy 01/27/2022   Prolonged QT interval 12/23/2021   Emphysematous cystitis 12/23/2021   Hypokalemia 12/23/2021   Vaginal candidiasis 12/23/2021   Left buttock abscess 12/22/2021   Chronic migraine w/o aura w/o status migrainosus, not intractable 08/01/2021   Fatigue 08/01/2021   Diabetic autonomic neuropathy associated with type 2 diabetes mellitus (HCC) 08/01/2021   Other fatigue 01/19/2020   Bilateral hearing loss 01/19/2020   Carpal tunnel syndrome of right wrist 09/17/2019   OSA (obstructive sleep apnea) 06/04/2019   Tommi Rumps Quervain's disease (radial styloid tenosynovitis) 11/06/2018   Gait abnormality 04/04/2018   Depression 01/29/2017   Migraine without aura and without status migrainosus, not intractable 03/30/2015   Dizziness, nonspecific 03/30/2015   Left lumbar radiculopathy 03/09/2015   Right optic neuritis 01/14/2015   Multiple sclerosis (HCC) 01/14/2015   OVARIAN CYST 02/09/2011   Morbid obesity (HCC) 10/24/2010   Poorly controlled type 2 diabetes mellitus (HCC) 09/22/2010   Vitamin D deficiency 09/22/2010   ANEMIA-IRON DEFICIENCY 09/22/2010   Essential hypertension, benign 09/22/2010    Past Surgical History:  Procedure Laterality Date   BREAST REDUCTION SURGERY     COLONOSCOPY WITH PROPOFOL N/A 06/02/2022   Procedure: COLONOSCOPY WITH PROPOFOL;  Surgeon: Daneil Dolin, MD;  Location: [facility];  Service: Endoscopy;  Laterality: N/A;  8:45AM   CYST EXCISION     INCISION AND DRAINAGE ABSCESS Left 12/23/2021   Procedure: INCISION AND DRAINAGE BUTTOCK ABSCESS;  Surgeon: Stark Klein, MD;  Location: [place];  Service: General;  Laterality: Left;   ovarian cyst removed     TUBAL LIGATION     WISDOM TOOTH EXTRACTION      OB History     Gravida  0   Para  0   Term  0   Preterm  0   AB  0   Living  0      SAB  0   IAB  0   Ectopic  0   Multiple  0   Live Births  0            Home  Medications    Prior to Admission medications   Medication Sig Start Date End Date Taking? Authorizing Provider  cephALEXin (KEFLEX) 500 MG capsule Take 1 capsule (500 mg total) by mouth 4 (four) times daily. 01/02/23  Yes Eulogio Bear, NP  doxycycline (VIBRAMYCIN) 100 MG capsule Take 1 capsule (100 mg total) by mouth 2 (two) times daily for 7 days. 01/02/23 01/09/23 Yes Eulogio Bear, NP  APPLE CIDER VINEGAR PO Take 1 tablet by mouth daily.    [provider]  atorvastatin (LIPITOR) 40 MG tablet Take 1 tablet (40 mg total) by mouth daily. 09/13/22   Alvira Monday, FNP  cyclobenzaprine (FLEXERIL) 10 MG tablet Take 1 tablet (10 mg total) by mouth 2 (two) times daily as needed for muscle spasms. 02/02/22   Suzzanne Cloud, NP  diphenhydrAMINE (BENADRYL) 25 MG tablet Take 25 mg by mouth every 6 (six) hours as needed for allergies.    [provider]  docusate sodium (COLACE) 50 MG capsule Take 1 capsule (50 mg total) by mouth 2 (two) times daily. Patient taking differently: Take 50 mg by mouth daily as needed for moderate constipation. 03/09/15   Marcial Pacas, MD  Dulaglutide (TRULICITY) 3 OA/4.1YS SOPN Inject 3 mg as directed once a week. 07/20/22   Cassandria Anger, MD  DULoxetine (CYMBALTA) 60 MG capsule Take 1 capsule (60 mg total) by mouth daily. 09/26/22   Suzzanne Cloud, NP  ELDERBERRY PO Take 1,150 mg by mouth daily.    [provider]  ferrous sulfate (EQL SLOW RELEASE IRON) 160 (50 Fe) MG TBCR SR tablet Take 1 tablet (160 mg total) by mouth daily. 01/27/22   Paseda, Dewaine Conger, FNP  gabapentin (NEURONTIN) 100 MG capsule Take 1 capsule (100 mg total) by mouth 3 (three) times daily. 11/02/22   Alvira Monday, FNP  HYDROcodone-acetaminophen (NORCO/VICODIN) 5-325 MG tablet One tablet every four hours as needed for acute pain.  Limit of five days per [place]. 09/05/22   Sanjuana Kava, MD  ibuprofen (ADVIL,MOTRIN) 200 MG tablet Take 200 mg by mouth as  needed for headache, moderate pain or mild pain.    [provider]  lisinopril (ZESTRIL) 20 MG tablet Take 1 tablet (20 mg total) by mouth daily. 01/27/22   Renee Rival, FNP  meclizine (ANTIVERT) 25 MG tablet Take 1 tablet (25 mg total) by mouth 3 (three) times daily as needed for dizziness. 07/19/20   Suzzanne Cloud, NP  metFORMIN (GLUCOPHAGE) 500 MG tablet Take 1 tablet (500 mg total) by mouth 2 (two) times daily with a meal.  01/27/22 01/27/23  Donell Beers, FNP  Multiple Vitamin (MULTIVITAMIN) capsule Take 1 capsule by mouth daily.    [provider]  ocrelizumab (OCREVUS) 300 MG/10ML injection Inject 300 mg into the vein every 6 (six) months.    [provider]  oxyCODONE-acetaminophen (PERCOCET/ROXICET) 5-325 MG tablet Take 1 tablet by mouth every 12 (twelve) hours. 07/03/22   Leath-Warren, Sadie Haber, NP  polyethylene glycol powder (GLYCOLAX/MIRALAX) 17 GM/SCOOP powder Take 1 capful (17 g) in water by mouth daily. Patient taking differently: Take 17 g by mouth daily as needed for moderate constipation. 12/30/21   Ghimire, Werner Lean, MD  polyethylene glycol-electrolytes (NULYTELY) 420 g solution As directed 04/28/22   Rourk, Gerrit Friends, MD  PRESCRIPTION MEDICATION Place 1 drop into both eyes at bedtime.    [provider]  rizatriptan (MAXALT) 10 MG tablet Take 1 tablet (10 mg total) by mouth as needed for migraine. May repeat in 2 hours if needed. Max dose: 2/24 hr or 15/30 days. 09/26/22   Glean Salvo, NP  topiramate (TOPAMAX) 100 MG tablet Take 1 tablet (100 mg total) by mouth at bedtime. 09/26/22   Glean Salvo, NP  triamcinolone cream (KENALOG) 0.1 % Apply 1 Application topically 2 (two) times daily. 09/12/22   Gilmore Laroche, FNP  Vitamin D, Ergocalciferol, (DRISDOL) 1.25 MG (50000 UNIT) CAPS capsule Take 1 capsule (50,000 Units total) by mouth every 7 (seven) days. 09/13/22   Gilmore Laroche, FNP    Family History Family History  Problem  Relation Age of Onset   Cancer Mother    Diabetes Mother    Hearing loss Mother    Thyroid nodules Mother    Anemia Mother    Kidney disease Mother    Hypertension Mother    Obesity Mother    Breast cancer Mother    Hyperlipidemia Father    High blood pressure Father    Sleep apnea Father    Pancreatic cancer Father    Anemia Sister    Anemia Sister    Arthritis Maternal Grandmother    Arthritis Maternal Grandfather    Arthritis Paternal Grandmother    Kidney disease Paternal Grandmother    Hypertension Paternal Grandmother    Arthritis Paternal Grandfather    Vision loss Paternal Grandfather    Stroke Paternal Grandfather    Hyperlipidemia Paternal Grandfather    Pancreatic cancer Maternal Uncle    Kidney disease Paternal Uncle    Colon cancer Neg Hx    Colon polyps Neg Hx     Social History Social History   Tobacco Use   Smoking status: Never   Smokeless tobacco: Never  Vaping Use   Vaping Use: Never used  Substance Use Topics   Alcohol use: No   Drug use: No     Allergies   Patient has no known allergies.   Review of Systems Review of Systems Per HPI  Physical Exam Triage Vital Signs ED Triage Vitals  Enc Vitals Group     BP 01/02/23 1556 128/79     Pulse Rate 01/02/23 1556 (!) 114     Resp 01/02/23 1556 20     Temp 01/02/23 1556 (!) 100.5 F (38.1 C)     Temp Source 01/02/23 1556 Oral     SpO2 01/02/23 1556 98 %     Weight --      Height --      Head Circumference --      Peak Flow --  Pain Score 01/02/23 1600 10     Pain Loc --      Pain Edu? --      Excl. in [place]? --    No data found.  Updated Vital Signs BP 119/82 (BP Location: Right Arm)   Pulse (!) 112   Temp 99.3 F (37.4 C) (Oral)   Resp 18   LMP 12/31/2022 (Exact Date)   SpO2 98%   Visual Acuity Right Eye Distance:   Left Eye Distance:   Bilateral Distance:    Right Eye Near:   Left Eye Near:    Bilateral Near:     Physical Exam Vitals and nursing note  reviewed.  Constitutional:      General: She is not in acute distress.    Appearance: Normal appearance. She is not toxic-appearing.  HENT:     Mouth/Throat:     Mouth: Mucous membranes are moist.     Pharynx: Oropharynx is clear.  Pulmonary:     Effort: Pulmonary effort is normal. No respiratory distress.  Musculoskeletal:       Back:     Comments: Approximately 3 cm x 3.5 cm indurated, hyperpigmented abscess to mid back and approximately area marked.  There is purulent drainage from the abscess.  It is firm to touch and tender to touch.  It is warm and slightly erythematous.  Skin:    General: Skin is warm and dry.     Capillary Refill: Capillary refill takes less than 2 seconds.     Findings: Abscess present.  Neurological:     Mental Status: She is alert and oriented to person, place, and time.  Psychiatric:        Behavior: Behavior is cooperative.      UC Treatments / Results  Labs (all labs ordered are listed, but only abnormal results are displayed) Labs Reviewed - No data to display  EKG   Radiology No results found.  Procedures Procedures (including critical care time)  Medications Ordered in UC Medications  acetaminophen (TYLENOL) tablet 1,000 mg (1,000 mg Oral Given 01/02/23 1615)    Initial Impression / Assessment and Plan / UC Course  I have reviewed the triage vital signs and the nursing notes.  Pertinent labs & imaging results that were available during my care of the patient were reviewed by me and considered in my medical decision making (see chart for details).   Patient is well-appearing, normotensive,  not tachypneic, oxygenating well on room air.  Initially in triage, patient has a low-grade fever and is tachycardic.  Tylenol 1000 mg given reduction in fever.  Patient remained mildly tachycardic.  Abscess of back Treat as outpatient with Keflex 4 times daily for 5 days and doxycycline twice daily for 7 days Recommended close follow-up with [place]  or primary care fighter in the next 48 hours for recheck Wound care discussed Continue ibuprofen and Tylenol as needed for pain Strict ER precautions discussed with patient  The patient was given the opportunity to ask questions.  All questions answered to their satisfaction.  The patient is in agreement to this plan.    Final Clinical Impressions(s) / UC Diagnoses   Final diagnoses:  Abscess of back     Discharge Instructions      I am concerned about the abscess in your back.  Please start both of the antibiotics (Keflex and doxycycline) and take them exactly as prescribed.  Continue changing the bandage on your abscess twice daily.  Please follow-up with [place]  or your primary care provider within the next 48 hours to have a reevaluation of the abscess.  If your symptoms worsen in the meantime, go to the hospital or call 911.     ED Prescriptions     Medication Sig Dispense Auth. Provider   cephALEXin (KEFLEX) 500 MG capsule Take 1 capsule (500 mg total) by mouth 4 (four) times daily. 20 capsule Cathlean Marseilles A, NP   doxycycline (VIBRAMYCIN) 100 MG capsule Take 1 capsule (100 mg total) by mouth 2 (two) times daily for 7 days. 14 capsule Valentino Nose, NP      PDMP not reviewed this encounter.   Valentino Nose, NP 01/02/23 [phone]

## 2023-01-15 ENCOUNTER — Ambulatory Visit: Payer: Medicare Other | Admitting: Family Medicine

## 2023-01-24 ENCOUNTER — Encounter: Payer: Self-pay | Admitting: Family Medicine

## 2023-01-24 ENCOUNTER — Ambulatory Visit (INDEPENDENT_AMBULATORY_CARE_PROVIDER_SITE_OTHER): Payer: Medicare Other | Admitting: Family Medicine

## 2023-01-24 VITALS — BP 125/85 | HR 97 | Ht 66.5 in | Wt 261.0 lb

## 2023-01-24 DIAGNOSIS — E038 Other specified hypothyroidism: Secondary | ICD-10-CM

## 2023-01-24 DIAGNOSIS — R7301 Impaired fasting glucose: Secondary | ICD-10-CM

## 2023-01-24 DIAGNOSIS — M5432 Sciatica, left side: Secondary | ICD-10-CM | POA: Diagnosis not present

## 2023-01-24 DIAGNOSIS — R5383 Other fatigue: Secondary | ICD-10-CM

## 2023-01-24 DIAGNOSIS — E782 Mixed hyperlipidemia: Secondary | ICD-10-CM

## 2023-01-24 DIAGNOSIS — G629 Polyneuropathy, unspecified: Secondary | ICD-10-CM

## 2023-01-24 DIAGNOSIS — L02212 Cutaneous abscess of back [any part, except buttock]: Secondary | ICD-10-CM | POA: Diagnosis not present

## 2023-01-24 DIAGNOSIS — E1165 Type 2 diabetes mellitus with hyperglycemia: Secondary | ICD-10-CM

## 2023-01-24 DIAGNOSIS — G35 Multiple sclerosis: Secondary | ICD-10-CM

## 2023-01-24 DIAGNOSIS — E559 Vitamin D deficiency, unspecified: Secondary | ICD-10-CM | POA: Diagnosis not present

## 2023-01-24 MED ORDER — MUPIROCIN 2 % EX OINT: 1.0000 | TOPICAL_OINTMENT | Freq: Two times a day (BID) | CUTANEOUS | 1 refills | Status: AC

## 2023-01-24 NOTE — Assessment & Plan Note (Signed)
She complains of sciatica from the buttocks to the left lower extremity She takes gabapentin 100 mg 3 times daily Encourage patient to take gabapentin 200 mg 3 times daily

## 2023-01-24 NOTE — Assessment & Plan Note (Signed)
She complains of numbness and tingling of her extremities She takes gabapentin at 100 mg 3 times daily Encouraged to take gabapentin 200 mg 3 times daily

## 2023-01-24 NOTE — Progress Notes (Signed)
Established Patient Office Visit  Subjective:  Patient ID: Katrina Montes, female    DOB: [birth year]  Age: 52 y.o. MRN: [id number]  CC:  Chief Complaint  Patient presents with   Follow-up    4 months f/u, has an abscess on her back that she needs to have looked at has been on her back for 3 weeks now (01/03/23). Pt c/o back pain numbeness from her hip down flare up going on for 2 weeks (01/10/23).     HPI Katrina Montes is a 52 y.o. female with past medical history of obstructive sleep apnea, essential hypertension, poorly controlled type 2 diabetes, and vaginal candidiasis presents for f/u of  chronic medical conditions. For the details of today's visit, please refer to the assessment and plan.     Past Medical History:  Diagnosis Date   Anemia    Anxiety    Back pain    Carpal tunnel syndrome    Constipation    Depression    Diabetes mellitus    Diabetes mellitus, type II (HCC)    Hyperlipidemia    Hypertension    Joint pain    Lactose intolerance    Multiple sclerosis ([place]) 2016   Optic neuritis    Shortness of breath    Sleep apnea    Swallowing difficulty    Swelling of both lower extremities    Vision, loss, sudden    Vitamin D deficiency     Past Surgical History:  Procedure Laterality Date   BREAST REDUCTION SURGERY     COLONOSCOPY WITH PROPOFOL N/A 06/02/2022   Procedure: COLONOSCOPY WITH PROPOFOL;  Surgeon: Daneil Dolin, MD;  Location: [facility];  Service: Endoscopy;  Laterality: N/A;  8:45AM   CYST EXCISION     INCISION AND DRAINAGE ABSCESS Left 12/23/2021   Procedure: INCISION AND DRAINAGE BUTTOCK ABSCESS;  Surgeon: Stark Klein, MD;  Location: [facility];  Service: General;  Laterality: Left;   ovarian cyst removed     TUBAL LIGATION     WISDOM TOOTH EXTRACTION      Family History  Problem Relation Age of Onset   Cancer Mother    Diabetes Mother    Hearing loss Mother    Thyroid nodules Mother    Anemia Mother    Kidney disease  Mother    Hypertension Mother    Obesity Mother    Breast cancer Mother    Hyperlipidemia Father    High blood pressure Father    Sleep apnea Father    Pancreatic cancer Father    Anemia Sister    Anemia Sister    Arthritis Maternal Grandmother    Arthritis Maternal Grandfather    Arthritis Paternal Grandmother    Kidney disease Paternal Grandmother    Hypertension Paternal Grandmother    Arthritis Paternal Grandfather    Vision loss Paternal Grandfather    Stroke Paternal Grandfather    Hyperlipidemia Paternal Grandfather    Pancreatic cancer Maternal Uncle    Kidney disease Paternal Uncle    Colon cancer Neg Hx    Colon polyps Neg Hx     Social History   Socioeconomic History   Marital status: Single    Spouse name: Not on file   Number of children: 0   Years of education: College   Highest education level: Not on file  Occupational History   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   Right handed.   Social Determinants of Health   Financial Resource Strain: Low Risk  (02/21/2022)   Overall Financial Resource Strain (CARDIA)    Difficulty of Paying Living Expenses: Not very hard  Food Insecurity: No Food Insecurity (02/21/2022)   Hunger Vital Sign    Worried About Running Out of Food in the Last Year: Never true    Ran Out of Food in the Last Year: Never true  Transportation Needs: No Transportation Needs (02/21/2022)   PRAPARE - Hydrologist (Medical): No    Lack of Transportation (Non-Medical): No  Physical Activity: Insufficiently Active (02/21/2022)   Exercise Vital Sign    Days of Exercise per Week: 3 days    Minutes of Exercise per Session: 30 min   Stress: No Stress Concern Present (02/21/2022)   [place]    Feeling of Stress : Not at all  Social Connections: Moderately Integrated (02/21/2022)   Social Connection and Isolation Panel [NHANES]    Frequency of Communication with Friends and Family: Twice a week    Frequency of Social Gatherings with Friends and Family: Twice a week    Attends Religious Services: 1 to 4 times per year    Active Member of [employer] or Organizations: Yes    Attends Archivist Meetings: Never    Marital Status: Never married  Intimate Partner Violence: Not At Risk (02/21/2022)   Humiliation, Afraid, Rape, and Kick questionnaire    Fear of Current or Ex-Partner: No    Emotionally Abused: No    Physically Abused: No    Sexually Abused: No    Outpatient Medications Prior to Visit  Medication Sig Dispense Refill   APPLE CIDER VINEGAR PO Take 1 tablet by mouth daily.     atorvastatin (LIPITOR) 40 MG tablet Take 1 tablet (40 mg total) by mouth daily. 90 tablet 3   cephALEXin (KEFLEX) 500 MG capsule Take 1 capsule (500 mg total) by mouth 4 (four) times daily. 20 capsule 0   cyclobenzaprine (FLEXERIL) 10 MG tablet Take 1 tablet (10 mg total) by mouth 2 (two) times daily as needed for muscle spasms. 30 tablet 3   diphenhydrAMINE (BENADRYL) 25 MG tablet Take 25 mg by mouth every 6 (six) hours as needed for allergies.     docusate sodium (COLACE) 50 MG capsule Take 1 capsule (50 mg total) by mouth 2 (two) times daily. (Patient taking differently: Take 50 mg by mouth daily as needed for moderate constipation.) 60 capsule 11   Dulaglutide (TRULICITY) 3 MH/9.6QI SOPN Inject 3 mg as directed once a week. 2 mL 2   DULoxetine (CYMBALTA) 60 MG capsule Take 1 capsule (60 mg total) by mouth daily. 90 capsule 3   ELDERBERRY PO Take 1,150 mg by mouth daily.     ferrous sulfate (EQL SLOW RELEASE IRON) 160 (50 Fe) MG TBCR SR tablet Take 1 tablet (160 mg  total) by mouth daily. 30 tablet 3   gabapentin (NEURONTIN) 100 MG capsule Take 1 capsule (100 mg total) by mouth 3 (three) times daily. 90 capsule 3   HYDROcodone-acetaminophen (NORCO/VICODIN) 5-325 MG tablet One tablet every four  hours as needed for acute pain.  Limit of five days per [place] statue. 30 tablet 0   ibuprofen (ADVIL,MOTRIN) 200 MG tablet Take 200 mg by mouth as needed for headache, moderate pain or mild pain.     lisinopril (ZESTRIL) 20 MG tablet Take 1 tablet (20 mg total) by mouth daily. 30 tablet 11   meclizine (ANTIVERT) 25 MG tablet Take 1 tablet (25 mg total) by mouth 3 (three) times daily as needed for dizziness. 30 tablet 6   metFORMIN (GLUCOPHAGE) 500 MG tablet Take 1 tablet (500 mg total) by mouth 2 (two) times daily with a meal. 60 tablet 11   Multiple Vitamin (MULTIVITAMIN) capsule Take 1 capsule by mouth daily.     ocrelizumab (OCREVUS) 300 MG/10ML injection Inject 300 mg into the vein every 6 (six) months.     oxyCODONE-acetaminophen (PERCOCET/ROXICET) 5-325 MG tablet Take 1 tablet by mouth every 12 (twelve) hours. 10 tablet 0   polyethylene glycol powder (GLYCOLAX/MIRALAX) 17 GM/SCOOP powder Take 1 capful (17 g) in water by mouth daily. (Patient taking differently: Take 17 g by mouth daily as needed for moderate constipation.) 238 g 0   polyethylene glycol-electrolytes (NULYTELY) 420 g solution As directed 4000 mL 0   PRESCRIPTION MEDICATION Place 1 drop into both eyes at bedtime.     rizatriptan (MAXALT) 10 MG tablet Take 1 tablet (10 mg total) by mouth as needed for migraine. May repeat in 2 hours if needed. Max dose: 2/24 hr or 15/30 days. 45 tablet 3   topiramate (TOPAMAX) 100 MG tablet Take 1 tablet (100 mg total) by mouth at bedtime. 90 tablet 4   triamcinolone cream (KENALOG) 0.1 % Apply 1 Application topically 2 (two) times daily. 30 g 0   Vitamin D, Ergocalciferol, (DRISDOL) 1.25 MG (50000 UNIT) CAPS capsule Take 1 capsule (50,000 Units total) by mouth every  7 (seven) days. 5 capsule 2   No facility-administered medications prior to visit.    No Known Allergies  ROS Review of Systems  Constitutional:  Negative for chills and fever.  Eyes:  Negative for visual disturbance.  Respiratory:  Negative for chest tightness and shortness of breath.   Skin:  Positive for wound.  Neurological:  Negative for dizziness and headaches.      Objective:    Physical Exam HENT:     Head: Normocephalic.     Mouth/Throat:     Mouth: Mucous membranes are moist.  Cardiovascular:     Rate and Rhythm: Normal rate.     Heart sounds: Normal heart sounds.  Pulmonary:     Effort: Pulmonary effort is normal.     Breath sounds: Normal breath sounds.  Skin:    General: Skin is warm.     Findings: Lesion present.     Comments: 3 cm abscess to mid back No purulent drainage from the abscess No tenderness with palpation No odor noted  Neurological:     Mental Status: She is alert.     BP 125/85   Pulse 97   Ht 5' 6.5" (1.689 m)   Wt 261 lb (118.4 kg)   LMP 12/31/2022 (Exact Date)   SpO2 97%   BMI 41.50 kg/m  Wt Readings from Last 3 Encounters:  01/24/23 261 lb (118.4 kg)  09/26/22 266 lb (120.7 kg)  09/12/22 266 lb (120.7 kg)    Lab Results  Component Value Date   TSH 1.580 09/12/2022   Lab Results  Component Value Date   WBC  5.7 09/12/2022   HGB 11.2 09/12/2022   HCT 34.9 09/12/2022   MCV 81 09/12/2022   PLT 398 09/12/2022   Lab Results  Component Value Date   NA 139 09/12/2022   K 4.9 09/12/2022   CO2 17 (L) 09/12/2022   GLUCOSE 148 (H) 09/12/2022   BUN 14 09/12/2022   CREATININE 0.79 09/12/2022   BILITOT 0.3 09/12/2022   ALKPHOS 83 09/12/2022   AST 10 09/12/2022   ALT 9 09/12/2022   PROT 7.2 09/12/2022   ALBUMIN 4.1 09/12/2022   CALCIUM 9.4 09/12/2022   ANIONGAP 7 12/26/2021   EGFR 92 09/12/2022   Lab Results  Component Value Date   CHOL 222 (H) 09/12/2022   Lab Results  Component Value Date   HDL 80 09/12/2022    Lab Results  Component Value Date   LDLCALC 124 (H) 09/12/2022   Lab Results  Component Value Date   TRIG 103 09/12/2022   Lab Results  Component Value Date   CHOLHDL 2.8 09/12/2022   Lab Results  Component Value Date   HGBA1C 7.3 (A) 07/20/2022      Assessment & Plan:  Sciatica of left side Assessment & Plan: She complains of sciatica from the buttocks to the left lower extremity She takes gabapentin 100 mg 3 times daily Encourage patient to take gabapentin 200 mg 3 times daily   Abscess of back Assessment & Plan: She was treated with Keflex and doxycycline and reports completing treatment No evidence of infection noted on examination Encourage meticulous wound care   Orders: -     Mupirocin; Apply 1 Application topically 2 (two) times daily.  Dispense: 30 g; Refill: 1  Multiple sclerosis (HCC) Assessment & Plan: Diagnosed since 2006 She is currently on Ocrevus 300 mg every six months She follows up with Dr. Terrace Arabia at [facility] Last treatment on 09/14/22 She is scheduled for treatment in March 2024    Neuropathy Assessment & Plan: She complains of numbness and tingling of her extremities She takes gabapentin at 100 mg 3 times daily Encouraged to take gabapentin 200 mg 3 times daily   Mixed hyperlipidemia -     Lipid panel -     CMP14+EGFR -     CBC with Differential/Platelet  Vitamin D deficiency -     VITAMIN D 25 Hydroxy (Vit-D Deficiency, Fractures)  Uncontrolled type 2 diabetes mellitus with hyperglycemia (HCC)  Other fatigue  IFG (impaired fasting glucose) -     Hemoglobin A1c  Other specified hypothyroidism -     TSH + free T4    Follow-up: Return in about 3 months (around 04/24/2023).   Gilmore Laroche, FNP

## 2023-01-24 NOTE — Patient Instructions (Signed)
I appreciate the opportunity to provide care to you today!    Follow up:  3 months  Labs: please stop by the lab today to get your blood drawn (CBC, CMP, TSH, Lipid profile, HgA1c, Vit D)   Please pick up your medications at the pharamcy    Please continue to a heart-healthy diet and increase your physical activities. Try to exercise for 36mins at least five times a week.      It was a pleasure to see you and I look forward to continuing to work together on your health and well-being. Please do not hesitate to call the office if you need care or have questions about your care.   Have a wonderful day and week. With Gratitude, Alvira Monday MSN, FNP-BC

## 2023-01-24 NOTE — Assessment & Plan Note (Signed)
Diagnosed since 2006 She is currently on Ocrevus 300 mg every six months She follows up with Dr. Krista Blue at [facility] Last treatment on 09/14/22 She is scheduled for treatment in March 2024

## 2023-01-24 NOTE — Assessment & Plan Note (Signed)
She was treated with Keflex and doxycycline and reports completing treatment No evidence of infection noted on examination Encourage meticulous wound care

## 2023-01-25 LAB — CMP14+EGFR
ALT: 12 IU/L (ref 0–32)
AST: 10 IU/L (ref 0–40)
Albumin/Globulin Ratio: 1.3 (ref 1.2–2.2)
Albumin: 4.1 g/dL (ref 3.9–4.9)
Alkaline Phosphatase: 80 IU/L (ref 44–121)
BUN/Creatinine Ratio: 11 (ref 9–23)
BUN: 8 mg/dL (ref 6–24)
Bilirubin Total: 0.5 mg/dL (ref 0.0–1.2)
CO2: 20 mmol/L (ref 20–29)
Calcium: 9.2 mg/dL (ref 8.7–10.2)
Chloride: 105 mmol/L (ref 96–106)
Creatinine, Ser: 0.73 mg/dL (ref 0.57–1.00)
Globulin, Total: 3.1 g/dL (ref 1.5–4.5)
Glucose: 138 mg/dL — ABNORMAL HIGH (ref 70–99)
Potassium: 4.4 mmol/L (ref 3.5–5.2)
Sodium: 138 mmol/L (ref 134–144)
Total Protein: 7.2 g/dL (ref 6.0–8.5)
eGFR: 100 mL/min/{1.73_m2} (ref >59)

## 2023-01-25 LAB — CBC WITH DIFFERENTIAL/PLATELET
Basophils Absolute: 0 10*3/uL (ref 0.0–0.2)
Basos: 1 %
EOS (ABSOLUTE): 0.1 10*3/uL (ref 0.0–0.4)
Eos: 1 %
Hematocrit: 37.7 % (ref 34.0–46.6)
Hemoglobin: 11.8 g/dL (ref 11.1–15.9)
Immature Grans (Abs): 0 10*3/uL (ref 0.0–0.1)
Immature Granulocytes: 0 %
Lymphocytes Absolute: 2 10*3/uL (ref 0.7–3.1)
Lymphs: 35 %
MCH: 25.5 pg — ABNORMAL LOW (ref 26.6–33.0)
MCHC: 31.3 g/dL — ABNORMAL LOW (ref 31.5–35.7)
MCV: 82 fL (ref 79–97)
Monocytes Absolute: 0.5 10*3/uL (ref 0.1–0.9)
Monocytes: 9 %
Neutrophils Absolute: 3.2 10*3/uL (ref 1.4–7.0)
Neutrophils: 54 %
Platelets: 462 10*3/uL — ABNORMAL HIGH (ref 150–450)
RBC: 4.62 x10E6/uL (ref 3.77–5.28)
RDW: 15.7 % — ABNORMAL HIGH (ref 11.7–15.4)
WBC: 5.8 10*3/uL (ref 3.4–10.8)

## 2023-01-25 LAB — LIPID PANEL
Chol/HDL Ratio: 2.7 ratio (ref 0.0–4.4)
Cholesterol, Total: 206 mg/dL — ABNORMAL HIGH (ref 100–199)
HDL: 77 mg/dL (ref >39)
LDL Chol Calc (NIH): 114 mg/dL — ABNORMAL HIGH (ref 0–99)
Triglycerides: 87 mg/dL (ref 0–149)
VLDL Cholesterol Cal: 15 mg/dL (ref 5–40)

## 2023-01-25 LAB — TSH+FREE T4
Free T4: 0.88 ng/dL (ref 0.82–1.77)
TSH: 1.27 u[IU]/mL (ref 0.450–4.500)

## 2023-01-25 LAB — VITAMIN D 25 HYDROXY (VIT D DEFICIENCY, FRACTURES): Vit D, 25-Hydroxy: 15.1 ng/mL — ABNORMAL LOW (ref 30.0–100.0)

## 2023-01-25 LAB — HEMOGLOBIN A1C
Est. average glucose Bld gHb Est-mCnc: 183 mg/dL
Hgb A1c MFr Bld: 8 % — ABNORMAL HIGH (ref 4.8–5.6)

## 2023-01-29 ENCOUNTER — Other Ambulatory Visit: Payer: Self-pay | Admitting: Family Medicine

## 2023-01-29 DIAGNOSIS — E785 Hyperlipidemia, unspecified: Secondary | ICD-10-CM

## 2023-01-29 DIAGNOSIS — E1165 Type 2 diabetes mellitus with hyperglycemia: Secondary | ICD-10-CM

## 2023-01-29 DIAGNOSIS — E559 Vitamin D deficiency, unspecified: Secondary | ICD-10-CM

## 2023-01-29 MED ORDER — VITAMIN D (ERGOCALCIFEROL) 1.25 MG (50000 UNIT) PO CAPS: 50000.0000 [IU] | ORAL_CAPSULE | ORAL | 1 refills | Status: DC

## 2023-01-29 MED ORDER — EZETIMIBE 10 MG PO TABS: 10.0000 mg | ORAL_TABLET | Freq: Every day | ORAL | 3 refills | Status: DC

## 2023-01-29 MED ORDER — METFORMIN HCL 500 MG PO TABS: 500.0000 mg | ORAL_TABLET | Freq: Two times a day (BID) | ORAL | 2 refills | Status: DC

## 2023-02-15 ENCOUNTER — Encounter (INDEPENDENT_AMBULATORY_CARE_PROVIDER_SITE_OTHER): Payer: Medicare Other | Admitting: Neurology

## 2023-02-15 ENCOUNTER — Encounter: Payer: Self-pay | Admitting: Radiology

## 2023-02-15 DIAGNOSIS — G629 Polyneuropathy, unspecified: Secondary | ICD-10-CM

## 2023-02-15 DIAGNOSIS — G35 Multiple sclerosis: Secondary | ICD-10-CM

## 2023-02-20 MED ORDER — GABAPENTIN 300 MG PO CAPS: 300.0000 mg | ORAL_CAPSULE | Freq: Three times a day (TID) | ORAL | 11 refills | Status: DC

## 2023-02-20 NOTE — Telephone Encounter (Signed)
Please see the MyChart message reply(ies) for my assessment and plan.    This patient gave consent for this Medical Advice Message and is aware that it may result in a bill to [employer], as well as the possibility of receiving a bill for a co-payment or deductible. They are an established patient, but are not seeking medical advice exclusively about a problem treated during an in person or video visit in the last seven days. I did not recommend an in person or video visit within seven days of my reply.    I spent a total of 6 minutes cumulative time within 7 days through [employer].  Marcial Pacas, MD

## 2023-03-05 ENCOUNTER — Ambulatory Visit (INDEPENDENT_AMBULATORY_CARE_PROVIDER_SITE_OTHER): Payer: Medicare Other

## 2023-03-05 DIAGNOSIS — Z0000 Encounter for general adult medical examination without abnormal findings: Secondary | ICD-10-CM | POA: Diagnosis not present

## 2023-03-05 NOTE — Progress Notes (Signed)
Subjective:   Katrina Montes is a 52 y.o. female who presents for Medicare Annual (Subsequent) preventive examination.  Review of Systems    I connected with  Keven Raiford Noble on 03/05/23 by a audio enabled telemedicine application and verified that I am speaking with the correct person using two identifiers.  Patient Location: Home  Provider Location: Office/Clinic  I discussed the limitations of evaluation and management by telemedicine. The patient expressed understanding and agreed to proceed.        Objective:    There were no vitals filed for this visit. There is no height or weight on file to calculate BMI.     06/02/2022    8:05 AM 12/23/2021    4:12 PM 09/17/2019   10:44 AM 01/01/2015    5:35 PM  Advanced Directives  Does Patient Have a Medical Advance Directive? No No No No  Would patient like information on creating a medical advance directive? No - Patient declined No - Patient declined Yes (MAU/Ambulatory/Procedural Areas - Information given) No - patient declined information    Current Medications (verified) Outpatient Encounter Medications as of 03/05/2023  Medication Sig   APPLE CIDER VINEGAR PO Take 1 tablet by mouth daily.   atorvastatin (LIPITOR) 40 MG tablet Take 1 tablet (40 mg total) by mouth daily.   cephALEXin (KEFLEX) 500 MG capsule Take 1 capsule (500 mg total) by mouth 4 (four) times daily.   cyclobenzaprine (FLEXERIL) 10 MG tablet Take 1 tablet (10 mg total) by mouth 2 (two) times daily as needed for muscle spasms.   diphenhydrAMINE (BENADRYL) 25 MG tablet Take 25 mg by mouth every 6 (six) hours as needed for allergies.   docusate sodium (COLACE) 50 MG capsule Take 1 capsule (50 mg total) by mouth 2 (two) times daily. (Patient taking differently: Take 50 mg by mouth daily as needed for moderate constipation.)   Dulaglutide (TRULICITY) 3 0000000 SOPN Inject 3 mg as directed once a week.   DULoxetine (CYMBALTA) 60 MG capsule Take 1 capsule  (60 mg total) by mouth daily.   ELDERBERRY PO Take 1,150 mg by mouth daily.   ezetimibe (ZETIA) 10 MG tablet Take 1 tablet (10 mg total) by mouth daily.   ferrous sulfate (EQL SLOW RELEASE IRON) 160 (50 Fe) MG TBCR SR tablet Take 1 tablet (160 mg total) by mouth daily.   gabapentin (NEURONTIN) 300 MG capsule Take 1 capsule (300 mg total) by mouth 3 (three) times daily.   HYDROcodone-acetaminophen (NORCO/VICODIN) 5-325 MG tablet One tablet every four hours as needed for acute pain.  Limit of five days per [place].   ibuprofen (ADVIL,MOTRIN) 200 MG tablet Take 200 mg by mouth as needed for headache, moderate pain or mild pain.   lisinopril (ZESTRIL) 20 MG tablet Take 1 tablet (20 mg total) by mouth daily.   meclizine (ANTIVERT) 25 MG tablet Take 1 tablet (25 mg total) by mouth 3 (three) times daily as needed for dizziness.   metFORMIN (GLUCOPHAGE) 500 MG tablet Take 1 tablet (500 mg total) by mouth 2 (two) times daily with a meal.   Multiple Vitamin (MULTIVITAMIN) capsule Take 1 capsule by mouth daily.   mupirocin ointment (BACTROBAN) 2 % Apply 1 Application topically 2 (two) times daily.   ocrelizumab (OCREVUS) 300 MG/10ML injection Inject 300 mg into the vein every 6 (six) months.   oxyCODONE-acetaminophen (PERCOCET/ROXICET) 5-325 MG tablet Take 1 tablet by mouth every 12 (twelve) hours.   polyethylene glycol powder (GLYCOLAX/MIRALAX) 17  GM/SCOOP powder Take 1 capful (17 g) in water by mouth daily. (Patient taking differently: Take 17 g by mouth daily as needed for moderate constipation.)   polyethylene glycol-electrolytes (NULYTELY) 420 g solution As directed   PRESCRIPTION MEDICATION Place 1 drop into both eyes at bedtime.   rizatriptan (MAXALT) 10 MG tablet Take 1 tablet (10 mg total) by mouth as needed for migraine. May repeat in 2 hours if needed. Max dose: 2/24 hr or 15/30 days.   topiramate (TOPAMAX) 100 MG tablet Take 1 tablet (100 mg total) by mouth at bedtime.   triamcinolone  cream (KENALOG) 0.1 % Apply 1 Application topically 2 (two) times daily.   Vitamin D, Ergocalciferol, (DRISDOL) 1.25 MG (50000 UNIT) CAPS capsule Take 1 capsule (50,000 Units total) by mouth every 7 (seven) days.   No facility-administered encounter medications on file as of 03/05/2023.    Allergies (verified) Patient has no known allergies.   History: Past Medical History:  Diagnosis Date   Anemia    Anxiety    Back pain    Carpal tunnel syndrome    Constipation    Depression    Diabetes mellitus    Diabetes mellitus, type II (HCC)    Hyperlipidemia    Hypertension    Joint pain    Lactose intolerance    Multiple sclerosis ([place]) 2016   Optic neuritis    Shortness of breath    Sleep apnea    Swallowing difficulty    Swelling of both lower extremities    Vision, loss, sudden    Vitamin D deficiency    Past Surgical History:  Procedure Laterality Date   BREAST REDUCTION SURGERY     COLONOSCOPY WITH PROPOFOL N/A 06/02/2022   Procedure: COLONOSCOPY WITH PROPOFOL;  Surgeon: Daneil Dolin, MD;  Location[facility];  Service: Endoscopy;  Laterality: N/A;  8:45AM   CYST EXCISION     INCISION AND DRAINAGE ABSCESS Left 12/23/2021   Procedure: INCISION AND DRAINAGE BUTTOCK ABSCESS;  Surgeon: Stark Klein, MD;  Location: [facility];  Service: General;  Laterality: Left;   ovarian cyst removed     TUBAL LIGATION     WISDOM TOOTH EXTRACTION     Family History  Problem Relation Age of Onset   Cancer Mother    Diabetes Mother    Hearing loss Mother    Thyroid nodules Mother    Anemia Mother    Kidney disease Mother    Hypertension Mother    Obesity Mother    Breast cancer Mother    Hyperlipidemia Father    High blood pressure Father    Sleep apnea Father    Pancreatic cancer Father    Anemia Sister    Anemia Sister    Arthritis Maternal Grandmother    Arthritis Maternal Grandfather    Arthritis Paternal Grandmother    Kidney disease Paternal Grandmother    Hypertension  Paternal Grandmother    Arthritis Paternal Grandfather    Vision loss Paternal Grandfather    Stroke Paternal Grandfather    Hyperlipidemia Paternal Grandfather    Pancreatic cancer Maternal Uncle    Kidney disease Paternal Uncle    Colon cancer Neg Hx    Colon polyps Neg Hx    Social History   Socioeconomic History   Marital status: Single    Spouse name: Not on file   Number of children: 0   Years of education: College   Highest education level: Not on file  Occupational History  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   Right handed.   Social Determinants of Health   Financial Resource Strain: Low Risk  (02/21/2022)   Overall Financial Resource Strain (CARDIA)    Difficulty of Paying Living Expenses: Not very hard  Food Insecurity: No Food Insecurity (02/21/2022)   Hunger Vital Sign    Worried About Running Out of Food in the Last Year: Never true    Ran Out of Food in the Last Year: Never true  Transportation Needs: No Transportation Needs (02/21/2022)   PRAPARE - Hydrologist (Medical): No    Lack of Transportation (Non-Medical): No  Physical Activity: Insufficiently Active (02/21/2022)   Exercise Vital Sign    Days of Exercise per Week: 3 days    Minutes of Exercise per Session: 30 min  Stress: No Stress Concern Present (02/21/2022)   [place]    Feeling of Stress : Not at all  Social Connections: Moderately Integrated (02/21/2022)   Social Connection and Isolation Panel [NHANES]    Frequency of Communication with Friends and Family: Twice a week    Frequency of Social Gatherings with Friends and  Family: Twice a week    Attends Religious Services: 1 to 4 times per year    Active Member of [employer] or Organizations: Yes    Attends Archivist Meetings: Never    Marital Status: Never married    Tobacco Counseling Counseling given: Not Answered   Clinical Intake:              How often do you need to have someone help you when you read instructions, pamphlets, or other written materials from your doctor or pharmacy?: (P) 1 - Never  Diabetic?yes Nutrition Risk Assessment:  Has the patient had any N/V/D within the last 2 months?  Yes  Does the patient have any non-healing wounds?  No  Has the patient had any unintentional weight loss or weight gain?  Yes   Diabetes:  Is the patient diabetic?  Yes  If diabetic, was a CBG obtained today?  No  Did the patient bring in their glucometer from home?  No  How often do you monitor your CBG's? Once daily .   Financial Strains and Diabetes Management:  Are you having any financial strains with the device, your supplies or your medication? No .  Does the patient want to be seen by Chronic Care Management for management of their diabetes?  No  Would the patient like to be referred to a Nutritionist or for Diabetic Management?  No   Diabetic Exams:  Diabetic Eye Exam: Completed 07/19/22 Diabetic Foot Exam: Completed 01/27/22           Activities of Daily Living    03/02/2023    3:12 PM 05/31/2022    9:35 AM  In your present state of health, do you have any difficulty performing the following activities:  Hearing? 0   Vision? 0   Difficulty concentrating or making decisions? 1   Walking or climbing stairs? 1   Dressing or bathing? 0  Doing errands, shopping? 0 0  Preparing Food and eating ? N   Using the Toilet? N   In the past six months, have you accidently leaked urine? Y   Do you have problems with loss of bowel control? N   Managing your Medications? N   Managing your Finances? N   Housekeeping or  managing your Housekeeping? Y     Patient Care Team: Alvira Monday, FNP as PCP - General (Family Medicine)  Indicate any recent Medical Services you may have received from other than [facility] providers in the past year (date may be approximate).     Assessment:   This is a routine wellness examination for [place].  Hearing/Vision screen No results found.  Dietary issues and exercise activities discussed:     Goals Addressed   None   Depression Screen    01/24/2023    3:15 PM 09/12/2022    8:34 AM 09/12/2022    8:10 AM 05/09/2022    1:39 PM 03/28/2022    1:17 PM 02/23/2022    8:10 AM 02/21/2022    8:55 AM  PHQ 2/9 Scores  PHQ - 2 Score 3 1 0 0 0 0 2  PHQ- 9 Score 9 6  0 0  2    Fall Risk    03/02/2023    3:12 PM 01/24/2023    3:15 PM 09/12/2022    8:09 AM 05/09/2022    1:39 PM 03/28/2022    1:17 PM  [place] in the past year? 1 0 1 0 0  Number falls in past yr: 1 0 0 0 0  Injury with Fall? 1 0 1 0 0  Comment   left foot in july of 2023    Risk for fall due to :  No Fall Risks Other (Comment) No Fall Risks No Fall Risks  Follow up  Falls evaluation completed  Falls evaluation completed Falls evaluation completed    [place]:  Any stairs in or around the home? Yes  If so, are there any without handrails? No  Home free of loose throw rugs in walkways, pet beds, electrical cords, etc? Yes  Adequate lighting in your home to reduce risk of falls? Yes   ASSISTIVE DEVICES UTILIZED TO PREVENT FALLS:  Life alert? No  Use of a cane, walker or w/c? Yes  Grab bars in the bathroom? Yes  Shower chair or bench in shower? Yes  Elevated toilet seat or a handicapped toilet? Yes           02/21/2022    9:06 AM  6CIT Screen  What Year? 0 points  What month? 0 points  What time? 0 points  Count back from 20 0 points  Months in reverse 0 points  Repeat phrase 4 points  Total Score 4 points    Immunizations Immunization History   Administered Date(s) Administered   Influenza Whole 10/24/2010   Influenza,inj,Quad PF,6+ Mos 09/01/2014, 03/14/2019, 09/17/2019, 12/02/2020, 01/27/2022, 10/11/2022   Moderna Covid-19 Vaccine Bivalent Booster 2yrs & up 07/21/2021   Moderna SARS-COV2 Booster Vaccination 11/16/2020   Moderna Sars-Covid-2 Vaccination 02/09/2020, 03/09/2020   Pneumococcal Polysaccharide-23 08/04/2014   Tdap 09/17/2019    TDAP status: Up to date  Flu Vaccine status: Up to date  Covid-19 vaccine status: Completed vaccines  Qualifies for Shingles Vaccine? Yes   Zostavax completed No   Shingrix Completed?: No.    Education has been provided regarding the importance of  this vaccine. Patient has been advised to call insurance company to determine out of pocket expense if they have not yet received this vaccine. Advised may also receive vaccine at local pharmacy or Health Dept. Verbalized acceptance and understanding.  Screening Tests Health Maintenance  Topic Date Due   FOOT EXAM  01/27/2023   Zoster Vaccines- Shingrix (1 of 2) 04/24/2023 (Originally 10/16/2022)   OPHTHALMOLOGY EXAM  07/20/2023   Diabetic kidney evaluation - Urine ACR  07/21/2023   HEMOGLOBIN A1C  07/25/2023   Diabetic kidney evaluation - eGFR measurement  01/25/2024   MAMMOGRAM  02/16/2024   Medicare Annual Wellness (AWV)  03/04/2024   PAP SMEAR-Modifier  03/28/2025   DTaP/Tdap/Td (2 - Td or Tdap) 09/16/2029   COLONOSCOPY (Pts 45-41yrs Insurance coverage will need to be confirmed)  06/02/2032   Hepatitis C Screening  Completed   HIV Screening  Completed   HPV VACCINES  Aged Out   INFLUENZA VACCINE  Discontinued   COVID-19 Vaccine  Discontinued    Health Maintenance  Health Maintenance Due  Topic Date Due   FOOT EXAM  01/27/2023    Colorectal cancer screening: Type of screening: Colonoscopy. Completed 06/02/22. Repeat every 10 years  Mammogram status: Completed 02/15/22. Repeat every year    Lung Cancer Screening: (Low  Dose CT Chest recommended if Age 1-80 years, 30 pack-year currently smoking OR have quit w/in 15years.) does not qualify.   Lung Cancer Screening Referral: no  Additional Screening:  Hepatitis C Screening: does qualify; Completed 01/14/15  Vision Screening: Recommended annual ophthalmology exams for early detection of glaucoma and other disorders of the eye. Is the patient up to date with their annual eye exam?  Yes  Who is the provider or what is the name of the office in which the patient attends annual eye exams? N/a If pt is not established with a provider, would they like to be referred to a provider to establish care? No .   Dental Screening: Recommended annual dental exams for proper oral hygiene  Community Resource Referral / Chronic Care Management: CRR required this visit?  No   CCM required this visit?  No      Plan:     I have personally reviewed and noted the following in the patient's chart:   Medical and social history Use of alcohol, tobacco or illicit drugs  Current medications and supplements including opioid prescriptions. Patient is currently taking opioid prescriptions. Information provided to patient regarding non-opioid alternatives. Patient advised to discuss non-opioid treatment plan with their provider. Functional ability and status Nutritional status Physical activity Advanced directives List of other physicians Hospitalizations, surgeries, and ER visits in previous 12 months Vitals Screenings to include cognitive, depression, and falls Referrals and appointments  In addition, I have reviewed and discussed with patient certain preventive protocols, quality metrics, and best practice recommendations. A written personalized care plan for preventive services as well as general preventive health recommendations were provided to patient.     Quentin Angst, [place]   03/05/2023

## 2023-03-15 DIAGNOSIS — G35 Multiple sclerosis: Secondary | ICD-10-CM | POA: Diagnosis not present

## 2023-04-03 ENCOUNTER — Encounter: Payer: Self-pay | Admitting: Neurology

## 2023-04-03 ENCOUNTER — Telehealth: Payer: Self-pay | Admitting: Neurology

## 2023-04-03 ENCOUNTER — Ambulatory Visit: Payer: Medicare Other | Admitting: Neurology

## 2023-04-03 VITALS — BP 133/84 | HR 85 | Ht 67.0 in | Wt 268.0 lb

## 2023-04-03 DIAGNOSIS — R5383 Other fatigue: Secondary | ICD-10-CM

## 2023-04-03 DIAGNOSIS — G35 Multiple sclerosis: Secondary | ICD-10-CM | POA: Diagnosis not present

## 2023-04-03 DIAGNOSIS — G4733 Obstructive sleep apnea (adult) (pediatric): Secondary | ICD-10-CM

## 2023-04-03 DIAGNOSIS — G43709 Chronic migraine without aura, not intractable, without status migrainosus: Secondary | ICD-10-CM | POA: Diagnosis not present

## 2023-04-03 DIAGNOSIS — G629 Polyneuropathy, unspecified: Secondary | ICD-10-CM | POA: Diagnosis not present

## 2023-04-03 MED ORDER — CELECOXIB 100 MG PO CAPS: 100.0000 mg | ORAL_CAPSULE | ORAL | 6 refills | Status: DC

## 2023-04-03 MED ORDER — CELECOXIB 100 MG PO CAPS: 100.0000 mg | ORAL_CAPSULE | Freq: Two times a day (BID) | ORAL | 6 refills | Status: AC | PRN

## 2023-04-03 MED ORDER — AIMOVIG 70 MG/ML ~~LOC~~ SOAJ: 70.0000 mg | SUBCUTANEOUS | 11 refills | Status: AC

## 2023-04-03 MED ORDER — DICLOFENAC SODIUM 1 % EX GEL: CUTANEOUS | 11 refills | Status: AC

## 2023-04-03 MED ORDER — TRAZODONE HCL 50 MG PO TABS: 100.0000 mg | ORAL_TABLET | Freq: Every day | ORAL | 11 refills | Status: AC

## 2023-04-03 NOTE — Progress Notes (Signed)
ASSESSMENT AND PLAN 52 y.o. year old female   Relapsing remitting multiple sclerosis  Overall stable on ocrelizumab   Repeat MRI of the brain, cervical spine Worsening neck pain, left hand paresthesia,  Likely left carpal tunnel syndromes, EMG nerve conduction study     Depression, chronic insomnia Cymbalta 60 mg daily   Migraine headaches  Over under good control, only couple times each month, will taper off Topamax, replace with trazodone for better sleep at nighttime,  Maxalt as needed    DIAGNOSTIC DATA (LABS, IMAGING, TESTING) - I reviewed patient records, labs, notes, testing and imaging myself where available.    HISTORY   Katrina Montes is 35 right-handed African-American female, accompanied by her mother, referred by her primary care physician Dr. Jeanice Lim for evaluation of right optic neuritis, abnormal MRI of the brain, suspicious for relapsing remitting multiple sclerosis, initial evaluation was on Jan 14 2015.   She had a past medical history of hypertension, diabetes, A1c was 11.9 in July 2015, most recent A1c was still high at 8.9, used to work as a Counselling psychologist job, which was out sourced recently,   In May of 2015, she had transient right eye blurry vision, was considered due to her high glucose, recovered within a few weeks   Since January 2016, she began to experience intermittent vertigo, dizziness, also had worsening right eye blurry vision, color washing out, could only see her rim at her right peripheral visual field.   Relapsing Remitting Multiple Sclerosis was diagnosed since January 2016: Based on her abnormal MRI of the brain, there was also cervical spinal cord involvement   MRI of the brain without contrast at [facility] imaging January 2016 showed multiple periventricular white matter disease, significant involvement of corpus callosum, perpendicular to ventricle, consistent with multiple sclerosis, now was also suspicious for right optic nerve edema, with  hyperintensity signal at T2, FLAIR, no contrast enhancement, MRA of brain was normal.   MRI of the cervical spine with and without contrast in Jan 2016: showing hyperintense foci within the spinal cord posteriorly at C4 and posteriolaterally at C7-T1 consistent with multiple sclerosis plaques. There were no enhancing foci.   Spinal fluid testing January 13 2015, total protein was 708, RBC was 1600, RBC was 7,, cloudy reddish-looking, elevated IgG 17.7, IgG index 0.97, IgG synthetic rate 238, 0 oligoclonal bands,   Visual evoked potential showed prolongation on the right side.   She was treated with IV Solu-Medrol for 3 days in Jan 2016, which only mild improved her right vision, has significant worsening of her blood glucose level.She was treated with Achtar injection in Feb 2016, but still having significant residual visual difficulty.   JC virus was positive with titer of 1.1 3 (Jan 14 2015),  laboratory evaluation showed normal or negative CPK, HIV, protein electrophoresis, positive varicella-zoster virus antibody, mild elevated ESR, C-reactive protein, hepatitis panel, Lyme titer, showed anemia, hemoglobin 11.8, otherwise normal CBC, normal CMP, with exception of elevated glucose, normal TSH,   She was treated with Tysarbril since March 14th, 2016, was switched to ocrelizumab since February 2020   MRI of the brain with and without contrast in April 2019 showed multiple supratentorium lesions, no contrast enhancement, no significant change compared to previous MRI of the brain   Vision: History of right optic neuritis, with partial recovery, now complains of blurry vision, difficult to read anything without bleeders or magnified glasses, difficult to read through computer screen or watch TV, visual acuity, OS 20/40,  OD 20/50-1   Cognitive impairment: has to take frequent notes, difficulty to concentrate, to express herself.   Gait abnormality: Treated easily, generalized weakness, also  complicated by her low back pain, left sided radicular pain,   Spasticity: Bilateral lower extremity spasticity, is taking gabapentin 300 mg 3 times a day previously tried and failed baclofen   Fatigue: Severe fatigue, also complicated by her depression anxiety chronic insomnia, body achy pain, limit her daily activity more than 50%, she has heavy menstruation-like cycle, chronic anemia, recent hemoglobin was 10.6.   Chronic migraine: She also has history of chronic migraine headaches, retro-orbital area severe pounding headache with associated light noise sensitivity, and happened frequently, up to 3 to four times each week, Maxalt as needed was helpful,   Severe low back pain, radiating pain to left lower extremity,  MRI of lumbar in July 201 there is minimum anterolisthesis of L4 upon L5, due to severe facet hypertrophy, associated with left ligamentum flavum hypertrophy and disc protrusion, there is moderate  Left foraminal and lateral recess stenosis, potential for left L4-5 nerve root compression.   Diabetic peripheral neuropathy, Progressive worsening bilateral feet paresthesia, now taking insulin   Depression anxiety, chronic insomnia, polypharmacy treatment:  Trazodone 50 mg every night, gabapentin 300 mg 3 times a day, Topamax 100 mg twice a day, Flexeril as needed   Weakness in hands: EMG nerve conduction study from outside clinic confirmed the diagnosis of severe right median neuropathy,   Urinary frequency: stable Hearing Loss-she reports occasional ringing in her ears, notices her hearing is not as good as it used to be, having to read lips, this is embarrassing to her   Mild obstructive sleep apnea, using CPAP machine,   Vitamin D deficiency: Vitamin D level was 23, on supplement     Update 15 2022 She is tolerating ocrelizumab, today alone at visit, constellation of complaints, has been out of her insulin due to financial concerns, last A1c December 2021 was 12.3, continue  complains of fatigue, depression, gait abnormality, frequent migraine headaches, mild improvement in her low back pain, there is no flareup of her MS symptoms, urinary urgency, mild gait abnormalities   We personally reviewed most recent MRI of the brain with without contrast in February 2021: Multiple T2/FLAIR hyperintensity in the hemisphere, left thalamus, brainstem, none of this is acute.  No contrast-enhancement      UPDATE April 03 2023 She tolerate ocrelizumab, seems to notice benefit only last 4 months, feeling fatigue, body achy pain 2 months prior to next infusion.  6 months history of  left arm issues, left hand numbness, pain, difficulty to use left hand,   EMG/NCS by Dr. Naaman Plummer in June 2020 showed severe left carpal tunnel syndrome.  She has no significant neck pain, no right arm pain, more balance issues, fell twice  She has noticed some speech change,   We personally reviewed MRI of the brain with without contrast March 2023, multiple supra and inferior tentorium chronic demyelinating plaque, no contrast-enhancement, no change from 2021  REVIEW OF SYSTEMS: Out of a complete 14 system review of symptoms, the patient complains only of the following symptoms, and all other reviewed systems are negative.  See HPI  PHYSICAL EXAM  Vitals:   04/03/23 1141  BP: 133/84  Pulse: 85  Weight: 268 lb (121.6 kg)  Height:  (1.702 m)   Body mass index is 41.97 kg/m.   PHYSICAL EXAMNIATION:  Gen: NAD, conversant, well nourised, well groomed  Cardiovascular: Regular rate rhythm, no peripheral edema, warm, nontender. Eyes: Conjunctivae clear without exudates or hemorrhage Neck: Supple, no carotid bruits. Pulmonary: Clear to auscultation bilaterally   NEUROLOGICAL EXAM:  MENTAL STATUS: Speech/cognition: Awake, alert oriented to history taking and casual conversation  CRANIAL NERVES: CN II: Visual fields are full to confrontation.  Pupils are  round equal and briskly reactive to light. CN III, IV, VI: extraocular movement are normal. No ptosis. CN V: Facial sensation is intact to pinprick in all 3 divisions bilaterally. Corneal responses are intact.  CN VII: Face is symmetric with normal eye closure and smile. CN VIII: Hearing is normal to casual conversation CN IX, X: Palate elevates symmetrically. Phonation is normal. CN XI: Head turning and shoulder shrug are intact   MOTOR: There is no pronator drift of out-stretched arms. Muscle bulk and tone are normal. Muscle strength is normal.  REFLEXES: Reflexes are 2+ and symmetric at the biceps, triceps, knees, and ankles. Plantar responses are flexor.  SENSORY: Decreased to pinprick at the left first 3 finger tip, left wrist Tinel signs,  COORDINATION: Rapid alternating movements and fine finger movements are intact. There is no dysmetria on finger-to-nose and heel-knee-shin.    GAIT/STANCE: Need push-up to get up from seated position, mildly antalgic,   ALLERGIES: No Known Allergies  HOME MEDICATIONS: Outpatient Medications Prior to Visit  Medication Sig Dispense Refill   APPLE CIDER VINEGAR PO Take 1 tablet by mouth daily.     atorvastatin (LIPITOR) 40 MG tablet Take 1 tablet (40 mg total) by mouth daily. 90 tablet 3   cephALEXin (KEFLEX) 500 MG capsule Take 1 capsule (500 mg total) by mouth 4 (four) times daily. 20 capsule 0   cyclobenzaprine (FLEXERIL) 10 MG tablet Take 1 tablet (10 mg total) by mouth 2 (two) times daily as needed for muscle spasms. 30 tablet 3   diphenhydrAMINE (BENADRYL) 25 MG tablet Take 25 mg by mouth every 6 (six) hours as needed for allergies.     docusate sodium (COLACE) 50 MG capsule Take 1 capsule (50 mg total) by mouth 2 (two) times daily. (Patient taking differently: Take 50 mg by mouth daily as needed for moderate constipation.) 60 capsule 11   Dulaglutide (TRULICITY) 3 MG/0.5ML SOPN Inject 3 mg as directed once a week. 2 mL 2   DULoxetine  (CYMBALTA) 60 MG capsule Take 1 capsule (60 mg total) by mouth daily. 90 capsule 3   ELDERBERRY PO Take 1,150 mg by mouth daily.     ezetimibe (ZETIA) 10 MG tablet Take 1 tablet (10 mg total) by mouth daily. 90 tablet 3   ferrous sulfate (EQL SLOW RELEASE IRON) 160 (50 Fe) MG TBCR SR tablet Take 1 tablet (160 mg total) by mouth daily. 30 tablet 3   gabapentin (NEURONTIN) 300 MG capsule Take 1 capsule (300 mg total) by mouth 3 (three) times daily. 90 capsule 11   HYDROcodone-acetaminophen (NORCO/VICODIN) 5-325 MG tablet One tablet every four hours as needed for acute pain.  Limit of five days per [place]. 30 tablet 0   ibuprofen (ADVIL,MOTRIN) 200 MG tablet Take 200 mg by mouth as needed for headache, moderate pain or mild pain.     lisinopril (ZESTRIL) 20 MG tablet Take 1 tablet (20 mg total) by mouth daily. 30 tablet 11   meclizine (ANTIVERT) 25 MG tablet Take 1 tablet (25 mg total) by mouth 3 (three) times daily as needed for dizziness. 30 tablet 6   metFORMIN (GLUCOPHAGE) 500 MG tablet  Take 1 tablet (500 mg total) by mouth 2 (two) times daily with a meal. 60 tablet 2   Multiple Vitamin (MULTIVITAMIN) capsule Take 1 capsule by mouth daily.     mupirocin ointment (BACTROBAN) 2 % Apply 1 Application topically 2 (two) times daily. 30 g 1   ocrelizumab (OCREVUS) 300 MG/10ML injection Inject 300 mg into the vein every 6 (six) months.     oxyCODONE-acetaminophen (PERCOCET/ROXICET) 5-325 MG tablet Take 1 tablet by mouth every 12 (twelve) hours. 10 tablet 0   polyethylene glycol powder (GLYCOLAX/MIRALAX) 17 GM/SCOOP powder Take 1 capful (17 g) in water by mouth daily. (Patient taking differently: Take 17 g by mouth daily as needed for moderate constipation.) 238 g 0   polyethylene glycol-electrolytes (NULYTELY) 420 g solution As directed 4000 mL 0   PRESCRIPTION MEDICATION Place 1 drop into both eyes at bedtime.     rizatriptan (MAXALT) 10 MG tablet Take 1 tablet (10 mg total) by mouth as needed  for migraine. May repeat in 2 hours if needed. Max dose: 2/24 hr or 15/30 days. 45 tablet 3   topiramate (TOPAMAX) 100 MG tablet Take 1 tablet (100 mg total) by mouth at bedtime. 90 tablet 4   triamcinolone cream (KENALOG) 0.1 % Apply 1 Application topically 2 (two) times daily. 30 g 0   Vitamin D, Ergocalciferol, (DRISDOL) 1.25 MG (50000 UNIT) CAPS capsule Take 1 capsule (50,000 Units total) by mouth every 7 (seven) days. 10 capsule 1   No facility-administered medications prior to visit.    PAST MEDICAL HISTORY: Past Medical History:  Diagnosis Date   Anemia    Anxiety    Back pain    Carpal tunnel syndrome    Constipation    Depression    Diabetes mellitus    Diabetes mellitus, type II    Hyperlipidemia    Hypertension    Joint pain    Lactose intolerance    Multiple sclerosis 2016   Optic neuritis    Shortness of breath    Sleep apnea    Swallowing difficulty    Swelling of both lower extremities    Vision, loss, sudden    Vitamin D deficiency     PAST SURGICAL HISTORY: Past Surgical History:  Procedure Laterality Date   BREAST REDUCTION SURGERY     COLONOSCOPY WITH PROPOFOL N/A 06/02/2022   Procedure: COLONOSCOPY WITH PROPOFOL;  Surgeon: Corbin Ade, MD;  Location: [facility] SUITE;  Service: Endoscopy;  Laterality: N/A;  8:45AM   CYST EXCISION     INCISION AND DRAINAGE ABSCESS Left 12/23/2021   Procedure: INCISION AND DRAINAGE BUTTOCK ABSCESS;  Surgeon: Almond Lint, MD;  Location: [facility] OR;  Service: General;  Laterality: Left;   ovarian cyst removed     TUBAL LIGATION     WISDOM TOOTH EXTRACTION      FAMILY HISTORY: Family History  Problem Relation Age of Onset   Cancer Mother    Diabetes Mother    Hearing loss Mother    Thyroid nodules Mother    Anemia Mother    Kidney disease Mother    Hypertension Mother    Obesity Mother    Breast cancer Mother    Hyperlipidemia Father    High blood pressure Father    Sleep apnea Father    Pancreatic cancer Father     Anemia Sister    Anemia Sister    Arthritis Maternal Grandmother    Arthritis Maternal Grandfather    Arthritis Paternal Grandmother  Kidney disease Paternal Grandmother    Hypertension Paternal Grandmother    Arthritis Paternal Grandfather    Vision loss Paternal Grandfather    Stroke Paternal Grandfather    Hyperlipidemia Paternal Grandfather    Pancreatic cancer Maternal Uncle    Kidney disease Paternal Uncle    Colon cancer Neg Hx    Colon polyps Neg Hx     SOCIAL HISTORY: Social History   Socioeconomic History   Marital status: Single    Spouse name: Not on file   Number of children: 0   Years of education: College   Highest education level: Not on file  Occupational History   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   Right handed.   Social Determinants of Health   Financial Resource Strain: Medium Risk (03/02/2023)   Overall Financial Resource Strain (CARDIA)    Difficulty of Paying Living Expenses: Somewhat hard  Food Insecurity: No Food Insecurity (03/05/2023)   Hunger Vital Sign    Worried About Running Out of Food in the Last Year: Never true    Ran Out of Food in the Last Year: Never true  Recent Concern: Food Insecurity - Food Insecurity Present (03/02/2023)   Hunger Vital Sign    Worried About Running Out of Food in the Last Year: Sometimes true    Ran Out of Food in the Last Year: Sometimes true  Transportation Needs: No Transportation Needs (03/05/2023)   PRAPARE - Administrator, Civil Service (Medical): No    Lack of Transportation (Non-Medical): No  Physical Activity: Unknown (03/02/2023)   Exercise Vital Sign    Days of Exercise per Week: Not on file     Minutes of Exercise per Session: 20 min  Stress: Stress Concern Present (03/02/2023)   [employer] of Occupational Health - Occupational Stress Questionnaire    Feeling of Stress : Very much  Social Connections: Unknown (03/02/2023)   Social Connection and Isolation Panel [NHANES]    Frequency of Communication with Friends and Family: Twice a week    Frequency of Social Gatherings with Friends and Family: Patient declined    Attends Religious Services: Not on Insurance claims handler of Clubs or Organizations: Yes    Attends Banker Meetings: More than 4 times per year    Marital Status: Never married  Intimate Partner Violence: Not At Risk (03/05/2023)   Humiliation, Afraid, Rape, and Kick questionnaire    Fear of Current or Ex-Partner: No    Emotionally Abused: No    Physically Abused: No    Sexually Abused: No   Levert Feinstein, M.D. Ph.D.  [facility] [address] [place], [place] [address] Phone: [phone] Fax:      [phone]

## 2023-04-03 NOTE — Telephone Encounter (Signed)
Meds ordered this encounter  Medications   celecoxib (CELEBREX) 100 MG capsule    Sig: Take 1 capsule (100 mg total) by mouth 2 (two) times daily as needed for up to 420 doses.    Dispense:  60 capsule    Refill:  6

## 2023-04-03 NOTE — Telephone Encounter (Signed)
[employer] medicare NPR sent to GI [phone] 

## 2023-04-03 NOTE — Telephone Encounter (Signed)
Katrina Montes called from [employer]. Stated he needs to clarify prescription on celecoxib (CELEBREX) 100 MG capsule.

## 2023-04-03 NOTE — Addendum Note (Signed)
Addended by: Levert Feinstein on: 04/03/2023 05:22 PM   Modules accepted: Orders

## 2023-04-03 NOTE — Patient Instructions (Addendum)
Meds ordered this encounter  Medications  celecoxib (CELEBREX) 100 MG capsule--as needed for pain   Sig: Take 1 capsule (100 mg total) by mouth 60 (sixty) minutes before procedure for 1 dose.   Dispense:  60 capsule   Refill:  6  Erenumab-aooe (AIMOVIG) 70 MG/ML SOAJ--as migraine prevention   Sig: Inject 70 mg into the skin every 30 (thirty) days.   Dispense:  1.12 mL   Refill:  11  traZODone (DESYREL) 50 MG tablet---for depression and sleep   Sig: Take 2 tablets (100 mg total) by mouth at bedtime.   Dispense:  60 tablet   Refill:  11  diclofenac Sodium (VOLTAREN ARTHRITIS PAIN) 1 % GEL   Sig: 2 gram qid prn   Dispense:  100 g   Refill:  11      Stop Topamax.

## 2023-04-24 ENCOUNTER — Encounter: Payer: Self-pay | Admitting: Pharmacist

## 2023-04-24 DIAGNOSIS — Z79899 Other long term (current) drug therapy: Secondary | ICD-10-CM

## 2023-04-24 NOTE — Progress Notes (Signed)
[facility]) [facility] Quality Pharmacy Team Statin Quality Measure Assessment  04/24/2023  Katrina Montes May 10, 1971 [id number]  Per review of chart and payor information, patient has a diagnosis of diabetes but is not currently filling a statin prescription.  This places patient into the Statin Use In Patients with Diabetes (SUPD) measure for CMS.    Patient has Atorvastatin on her medication list but it has not been filled since 09/23.  She is also on Ezetimibe.   She has an upcoming follow up appointment on 04/25/23.    If deemed therapeutically appropriate, a statin assessment could be completed at the appointment.  If patient has experienced statin intolerance, a statin exclusion code could be associated with the visit to remove the patient from the measure.   The 10-year ASCVD risk score (Arnett DK, et al., 2019) is: 11.8%   Values used to calculate the score:     Age: 52 years     Sex: Female     Is Non-Hispanic African American: Yes     Diabetic: Yes     Tobacco smoker: Yes     Systolic Blood Pressure: 133 mmHg     Is BP treated: Yes     HDL Cholesterol: 77 mg/dL     Total Cholesterol: 206 mg/dL 4/0/9811     Component Value Date/Time   CHOL 206 (H) 01/24/2023 1552   TRIG 87 01/24/2023 1552   HDL 77 01/24/2023 1552   CHOLHDL 2.7 01/24/2023 1552   CHOLHDL 2.5 11/19/2020 0931   VLDL 23 11/02/2014 0944   LDLCALC 114 (H) 01/24/2023 1552   LDLCALC 87 11/19/2020 0931    Please consider ONE of the following recommendations:  Initiate high intensity statin Atorvastatin 40 mg once daily, #90, 3 refills   Rosuvastatin 20 mg once daily, #90, 3 refills    Initiate moderate intensity          statin with reduced frequency if prior          statin intolerance 1x weekly, #13, 3 refills   2x weekly, #26, 3 refills   3x weekly, #39, 3 refills    Code for past statin intolerance or  other exclusions (required annually)  Provider Requirements: Associate  code during an office visit or telehealth encounter  Drug Induced Myopathy G72.0   Myopathy, unspecified G72.9   Myositis, unspecified M60.9   Rhabdomyolysis M62.82   Cirrhosis of liver K74.69   Prediabetes R73.03   PCOS E28.2    Plan:  Route note to Provider prior to upcoming appointment.  Beecher Mcardle, PharmD, BCACP [facility] Clinical Pharmacist [phone]

## 2023-04-25 ENCOUNTER — Ambulatory Visit (INDEPENDENT_AMBULATORY_CARE_PROVIDER_SITE_OTHER): Payer: Medicare Other | Admitting: Family Medicine

## 2023-04-25 ENCOUNTER — Encounter: Payer: Self-pay | Admitting: Family Medicine

## 2023-04-25 VITALS — BP 121/85 | HR 107 | Ht 66.5 in | Wt 273.0 lb

## 2023-04-25 DIAGNOSIS — R7301 Impaired fasting glucose: Secondary | ICD-10-CM

## 2023-04-25 DIAGNOSIS — E1169 Type 2 diabetes mellitus with other specified complication: Secondary | ICD-10-CM | POA: Diagnosis not present

## 2023-04-25 DIAGNOSIS — E1165 Type 2 diabetes mellitus with hyperglycemia: Secondary | ICD-10-CM

## 2023-04-25 DIAGNOSIS — E782 Mixed hyperlipidemia: Secondary | ICD-10-CM

## 2023-04-25 DIAGNOSIS — Z7984 Long term (current) use of oral hypoglycemic drugs: Secondary | ICD-10-CM

## 2023-04-25 DIAGNOSIS — I10 Essential (primary) hypertension: Secondary | ICD-10-CM | POA: Diagnosis not present

## 2023-04-25 DIAGNOSIS — E559 Vitamin D deficiency, unspecified: Secondary | ICD-10-CM | POA: Diagnosis not present

## 2023-04-25 DIAGNOSIS — E038 Other specified hypothyroidism: Secondary | ICD-10-CM | POA: Diagnosis not present

## 2023-04-25 DIAGNOSIS — E785 Hyperlipidemia, unspecified: Secondary | ICD-10-CM

## 2023-04-25 NOTE — Assessment & Plan Note (Signed)
Controlled She denies headaches, dizziness, blurred vision, and chest pain She is stable on lisinopril 20 mg daily Encouraged to continue therapy BP Readings from Last 3 Encounters:  04/25/23 121/85  04/03/23 133/84  01/24/23 125/85

## 2023-04-25 NOTE — Patient Instructions (Addendum)
I appreciate the opportunity to provide care to you today!    Follow up:  3 months  Labs: please stop by the lab today to get your blood drawn (CBC, CMP, TSH, Lipid profile, HgA1c, Vit D)     Please continue to a heart-healthy diet and increase your physical activities. Try to exercise for 30mins at least five days a week.      It was a pleasure to see you and I look forward to continuing to work together on your health and well-being. Please do not hesitate to call the office if you need care or have questions about your care.   Have a wonderful day and week. With Gratitude, Kiegan Macaraeg MSN, FNP-BC  

## 2023-04-25 NOTE — Progress Notes (Signed)
Established Patient Office Visit  Subjective:  Patient ID: Katrina Montes, female    DOB: 27-Nov-1971  Age: 52 y.o. MRN: [id number]  CC:  Chief Complaint  Patient presents with   Chronic Care Management    3 month f/u, pt is fasting in case labs are needed. Not sure if she should still be taking iron.    Skin Problem    Pt reports spot on her back from a boil. Has discomfort there in the same area.     HPI Katrina Montes is a 52 y.o. female with past medical history of hypertension, type 2 diabetes,and  hyperlipidemia presents for f/u of  chronic medical conditions. For the details of today's visit, please refer to the assessment and plan.     Past Medical History:  Diagnosis Date   Anemia    Anxiety    Back pain    Carpal tunnel syndrome    Constipation    Depression    Diabetes mellitus    Diabetes mellitus, type II (HCC)    Hyperlipidemia    Hypertension    Joint pain    Lactose intolerance    Multiple sclerosis (HCC) 2016   Optic neuritis    Shortness of breath    Sleep apnea    Swallowing difficulty    Swelling of both lower extremities    Vision, loss, sudden    Vitamin D deficiency     Past Surgical History:  Procedure Laterality Date   BREAST REDUCTION SURGERY     COLONOSCOPY WITH PROPOFOL N/A 06/02/2022   Procedure: COLONOSCOPY WITH PROPOFOL;  Surgeon: Corbin Ade, MD;  Location: [facility];  Service: Endoscopy;  Laterality: N/A;  8:45AM   CYST EXCISION     INCISION AND DRAINAGE ABSCESS Left 12/23/2021   Procedure: INCISION AND DRAINAGE BUTTOCK ABSCESS;  Surgeon: Almond Lint, MD;  Location: [facility] OR;  Service: General;  Laterality: Left;   ovarian cyst removed     TUBAL LIGATION     WISDOM TOOTH EXTRACTION      Family History  Problem Relation Age of Onset   Cancer Mother    Diabetes Mother    Hearing loss Mother    Thyroid nodules Mother    Anemia Mother    Kidney disease Mother    Hypertension Mother    Obesity Mother     Breast cancer Mother    Hyperlipidemia Father    High blood pressure Father    Sleep apnea Father    Pancreatic cancer Father    Anemia Sister    Anemia Sister    Arthritis Maternal Grandmother    Arthritis Maternal Grandfather    Arthritis Paternal Grandmother    Kidney disease Paternal Grandmother    Hypertension Paternal Grandmother    Arthritis Paternal Grandfather    Vision loss Paternal Grandfather    Stroke Paternal Grandfather    Hyperlipidemia Paternal Grandfather    Pancreatic cancer Maternal Uncle    Kidney disease Paternal Uncle    Colon cancer Neg Hx    Colon polyps Neg Hx     Social History   Socioeconomic History   Marital status: Single    Spouse name: Not on file   Number of children: 0   Years of education: College   Highest education level: Bachelor's degree (e.g., BA, AB, BS)  Occupational History   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   Right handed.   Social Determinants of Health   Financial Resource Strain: Medium Risk (03/02/2023)   Overall Financial Resource Strain (CARDIA)    Difficulty of Paying Living Expenses: Somewhat hard  Food Insecurity: Food Insecurity Present (04/24/2023)   Hunger Vital Sign    Worried About Running Out of Food in the Last Year: Sometimes true    Ran Out of Food in the Last Year: Sometimes true  Transportation Needs: No Transportation Needs (04/24/2023)   PRAPARE - Administrator, Civil Service (Medical): No    Lack of Transportation (Non-Medical): No  Physical Activity: Insufficiently Active (04/24/2023)   Exercise Vital Sign    Days of Exercise per Week: 3 days    Minutes of Exercise per Session: 20 min  Stress: Stress  Concern Present (04/24/2023)   [employer] of Occupational Health - Occupational Stress Questionnaire    Feeling of Stress : Very much  Social Connections: Moderately Integrated (04/24/2023)   Social Connection and Isolation Panel [NHANES]    Frequency of Communication with Friends and Family: More than three times a week    Frequency of Social Gatherings with Friends and Family: Once a week    Attends Religious Services: More than 4 times per year    Active Member of [employer] or Organizations: Yes    Attends Banker Meetings: More than 4 times per year    Marital Status: Never married  Intimate Partner Violence: Not At Risk (03/05/2023)   Humiliation, Afraid, Rape, and Kick questionnaire    Fear of Current or Ex-Partner: No    Emotionally Abused: No    Physically Abused: No    Sexually Abused: No    Outpatient Medications Prior to Visit  Medication Sig Dispense Refill   APPLE CIDER VINEGAR PO Take 1 tablet by mouth daily.     atorvastatin (LIPITOR) 40 MG tablet Take 1 tablet (40 mg total) by mouth daily. 90 tablet 3   celecoxib (CELEBREX) 100 MG capsule Take 1 capsule (100 mg total) by mouth 2 (two) times daily as needed for up to 420 doses. 60 capsule 6   cephALEXin (KEFLEX) 500 MG capsule Take 1 capsule (500 mg total) by mouth 4 (four) times daily. 20 capsule 0   cyclobenzaprine (FLEXERIL) 10 MG tablet Take 1 tablet (10 mg total) by mouth 2 (two) times daily as needed for muscle spasms. 30 tablet 3   diclofenac Sodium (VOLTAREN ARTHRITIS PAIN) 1 % GEL 2 gram qid prn 100 g 11   diphenhydrAMINE (BENADRYL) 25 MG tablet Take 25 mg by mouth every 6 (six) hours as needed for allergies.     docusate sodium (COLACE) 50 MG capsule Take 1 capsule (50 mg total) by mouth 2 (two) times daily. (Patient taking differently: Take 50 mg by mouth daily as needed for moderate constipation.) 60 capsule 11   Dulaglutide (TRULICITY) 3 MG/0.5ML SOPN Inject 3 mg as directed once a week. 2 mL 2    DULoxetine (CYMBALTA) 60 MG capsule Take 1 capsule (60 mg total) by mouth daily. 90 capsule 3   ELDERBERRY PO Take 1,150 mg by mouth daily.     Erenumab-aooe (AIMOVIG) 70 MG/ML SOAJ Inject 70 mg into  the skin every 30 (thirty) days. 1.12 mL 11   ezetimibe (ZETIA) 10 MG tablet Take 1 tablet (10 mg total) by mouth daily. 90 tablet 3   gabapentin (NEURONTIN) 300 MG capsule Take 1 capsule (300 mg total) by mouth 3 (three) times daily. 90 capsule 11   ibuprofen (ADVIL,MOTRIN) 200 MG tablet Take 200 mg by mouth as needed for headache, moderate pain or mild pain.     lisinopril (ZESTRIL) 20 MG tablet Take 1 tablet (20 mg total) by mouth daily. 30 tablet 11   meclizine (ANTIVERT) 25 MG tablet Take 1 tablet (25 mg total) by mouth 3 (three) times daily as needed for dizziness. 30 tablet 6   metFORMIN (GLUCOPHAGE) 500 MG tablet Take 1 tablet (500 mg total) by mouth 2 (two) times daily with a meal. 60 tablet 2   Multiple Vitamin (MULTIVITAMIN) capsule Take 1 capsule by mouth daily.     mupirocin ointment (BACTROBAN) 2 % Apply 1 Application topically 2 (two) times daily. 30 g 1   ocrelizumab (OCREVUS) 300 MG/10ML injection Inject 300 mg into the vein every 6 (six) months.     polyethylene glycol powder (GLYCOLAX/MIRALAX) 17 GM/SCOOP powder Take 1 capful (17 g) in water by mouth daily. (Patient taking differently: Take 17 g by mouth daily as needed for moderate constipation.) 238 g 0   polyethylene glycol-electrolytes (NULYTELY) 420 g solution As directed 4000 mL 0   PRESCRIPTION MEDICATION Place 1 drop into both eyes at bedtime.     rizatriptan (MAXALT) 10 MG tablet Take 1 tablet (10 mg total) by mouth as needed for migraine. May repeat in 2 hours if needed. Max dose: 2/24 hr or 15/30 days. 45 tablet 3   traZODone (DESYREL) 50 MG tablet Take 2 tablets (100 mg total) by mouth at bedtime. 60 tablet 11   triamcinolone cream (KENALOG) 0.1 % Apply 1 Application topically 2 (two) times daily. 30 g 0   Vitamin D,  Ergocalciferol, (DRISDOL) 1.25 MG (50000 UNIT) CAPS capsule Take 1 capsule (50,000 Units total) by mouth every 7 (seven) days. 10 capsule 1   ferrous sulfate (EQL SLOW RELEASE IRON) 160 (50 Fe) MG TBCR SR tablet Take 1 tablet (160 mg total) by mouth daily. (Patient not taking: Reported on 04/25/2023) 30 tablet 3   No facility-administered medications prior to visit.    No Known Allergies  ROS Review of Systems  Constitutional:  Negative for chills and fever.  Eyes:  Negative for visual disturbance.  Respiratory:  Negative for chest tightness and shortness of breath.   Skin:  Positive for rash. Negative for color change.  Neurological:  Negative for dizziness and headaches.      Objective:    Physical Exam HENT:     Head: Normocephalic.     Mouth/Throat:     Mouth: Mucous membranes are moist.  Cardiovascular:     Rate and Rhythm: Normal rate.     Heart sounds: Normal heart sounds.  Pulmonary:     Effort: Pulmonary effort is normal.     Breath sounds: Normal breath sounds.  Skin:    Findings: Lesion (a scab woound noted on the upper mid back) present.  Neurological:     Mental Status: She is alert.     BP 121/85   Pulse (!) 107   Ht 5' 6.5" (1.689 m)   Wt 273 lb 0.6 oz (123.9 kg)   SpO2 98%   BMI 43.41 kg/m  Wt Readings from Last 3 Encounters:  04/25/23 273 lb 0.6 oz (123.9 kg)  04/03/23 268 lb (121.6 kg)  01/24/23 261 lb (118.4 kg)    Lab Results  Component Value Date   TSH 1.270 01/24/2023   Lab Results  Component Value Date   WBC 5.8 01/24/2023   HGB 11.8 01/24/2023   HCT 37.7 01/24/2023   MCV 82 01/24/2023   PLT 462 (H) 01/24/2023   Lab Results  Component Value Date   NA 138 01/24/2023   K 4.4 01/24/2023   CO2 20 01/24/2023   GLUCOSE 138 (H) 01/24/2023   BUN 8 01/24/2023   CREATININE 0.73 01/24/2023   BILITOT 0.5 01/24/2023   ALKPHOS 80 01/24/2023   AST 10 01/24/2023   ALT 12 01/24/2023   PROT 7.2 01/24/2023   ALBUMIN 4.1 01/24/2023    CALCIUM 9.2 01/24/2023   ANIONGAP 7 12/26/2021   EGFR 100 01/24/2023   Lab Results  Component Value Date   CHOL 206 (H) 01/24/2023   Lab Results  Component Value Date   HDL 77 01/24/2023   Lab Results  Component Value Date   LDLCALC 114 (H) 01/24/2023   Lab Results  Component Value Date   TRIG 87 01/24/2023   Lab Results  Component Value Date   CHOLHDL 2.7 01/24/2023   Lab Results  Component Value Date   HGBA1C 8.0 (H) 01/24/2023      Assessment & Plan:  Essential hypertension, benign Assessment & Plan: Controlled She denies headaches, dizziness, blurred vision, and chest pain She is stable on lisinopril 20 mg daily Encouraged to continue therapy BP Readings from Last 3 Encounters:  04/25/23 121/85  04/03/23 133/84  01/24/23 125/85      Uncontrolled type 2 diabetes mellitus with hyperglycemia (HCC) Assessment & Plan: Patient is taking metformin 500 mg twice daily and Trulicity 3 mg weekly No reports of polyuria, polyphagia, polydipsia Pending hemoglobin A1c Lab Results  Component Value Date   HGBA1C 8.0 (H) 01/24/2023      Mixed hyperlipidemia Assessment & Plan: Patient is taking Lipitor 40 mg daily and ezetimibe 10 mg daily No complaints or concerns voiced Pending lipid panel Lab Results  Component Value Date   CHOL 206 (H) 01/24/2023   HDL 77 01/24/2023   LDLCALC 114 (H) 01/24/2023   TRIG 87 01/24/2023   CHOLHDL 2.7 01/24/2023      Hyperlipidemia associated with type 2 diabetes mellitus (HCC) -     Lipid panel -     CMP14+EGFR -     CBC with Differential/Platelet  IFG (impaired fasting glucose) -     [facility] Diabetes Foot Exam -     Hemoglobin A1c  Vitamin D deficiency -     VITAMIN D 25 Hydroxy (Vit-D Deficiency, Fractures)  Other specified hypothyroidism -     TSH + free T4    Follow-up: Return in about 3 months (around 07/26/2023).   Gilmore Laroche, FNP

## 2023-04-25 NOTE — Assessment & Plan Note (Signed)
Patient is taking Lipitor 40 mg daily and ezetimibe 10 mg daily No complaints or concerns voiced Pending lipid panel Lab Results  Component Value Date   CHOL 206 (H) 01/24/2023   HDL 77 01/24/2023   LDLCALC 114 (H) 01/24/2023   TRIG 87 01/24/2023   CHOLHDL 2.7 01/24/2023

## 2023-04-25 NOTE — Assessment & Plan Note (Signed)
Patient is taking metformin 500 mg twice daily and Trulicity 3 mg weekly No reports of polyuria, polyphagia, polydipsia Pending hemoglobin A1c Lab Results  Component Value Date   HGBA1C 8.0 (H) 01/24/2023

## 2023-04-26 LAB — CBC WITH DIFFERENTIAL/PLATELET
Basophils Absolute: 0 10*3/uL (ref 0.0–0.2)
Basos: 1 %
EOS (ABSOLUTE): 0.1 10*3/uL (ref 0.0–0.4)
Eos: 1 %
Hematocrit: 35.2 % (ref 34.0–46.6)
Hemoglobin: 10.9 g/dL — ABNORMAL LOW (ref 11.1–15.9)
Immature Grans (Abs): 0 10*3/uL (ref 0.0–0.1)
Immature Granulocytes: 0 %
Lymphocytes Absolute: 1.9 10*3/uL (ref 0.7–3.1)
Lymphs: 28 %
MCH: 25.8 pg — ABNORMAL LOW (ref 26.6–33.0)
MCHC: 31 g/dL — ABNORMAL LOW (ref 31.5–35.7)
MCV: 83 fL (ref 79–97)
Monocytes Absolute: 0.7 10*3/uL (ref 0.1–0.9)
Monocytes: 11 %
Neutrophils Absolute: 3.9 10*3/uL (ref 1.4–7.0)
Neutrophils: 59 %
Platelets: 407 10*3/uL (ref 150–450)
RBC: 4.22 x10E6/uL (ref 3.77–5.28)
RDW: 15.1 % (ref 11.7–15.4)
WBC: 6.6 10*3/uL (ref 3.4–10.8)

## 2023-04-26 LAB — LIPID PANEL
Chol/HDL Ratio: 2.6 ratio (ref 0.0–4.4)
Cholesterol, Total: 222 mg/dL — ABNORMAL HIGH (ref 100–199)
HDL: 84 mg/dL (ref >39)
LDL Chol Calc (NIH): 117 mg/dL — ABNORMAL HIGH (ref 0–99)
Triglycerides: 122 mg/dL (ref 0–149)
VLDL Cholesterol Cal: 21 mg/dL (ref 5–40)

## 2023-04-26 LAB — CMP14+EGFR
ALT: 10 IU/L (ref 0–32)
AST: 12 IU/L (ref 0–40)
Albumin/Globulin Ratio: 1.4 (ref 1.2–2.2)
Albumin: 4.2 g/dL (ref 3.9–4.9)
Alkaline Phosphatase: 107 IU/L (ref 44–121)
BUN/Creatinine Ratio: 18 (ref 9–23)
BUN: 12 mg/dL (ref 6–24)
Bilirubin Total: 0.3 mg/dL (ref 0.0–1.2)
CO2: 21 mmol/L (ref 20–29)
Calcium: 9 mg/dL (ref 8.7–10.2)
Chloride: 106 mmol/L (ref 96–106)
Creatinine, Ser: 0.65 mg/dL (ref 0.57–1.00)
Globulin, Total: 2.9 g/dL (ref 1.5–4.5)
Glucose: 178 mg/dL — ABNORMAL HIGH (ref 70–99)
Potassium: 4.5 mmol/L (ref 3.5–5.2)
Sodium: 140 mmol/L (ref 134–144)
Total Protein: 7.1 g/dL (ref 6.0–8.5)
eGFR: 107 mL/min/{1.73_m2} (ref >59)

## 2023-04-26 LAB — HEMOGLOBIN A1C
Est. average glucose Bld gHb Est-mCnc: 203 mg/dL
Hgb A1c MFr Bld: 8.7 % — ABNORMAL HIGH (ref 4.8–5.6)

## 2023-04-26 LAB — TSH+FREE T4
Free T4: 0.8 ng/dL — ABNORMAL LOW (ref 0.82–1.77)
TSH: 1.08 u[IU]/mL (ref 0.450–4.500)

## 2023-04-26 LAB — VITAMIN D 25 HYDROXY (VIT D DEFICIENCY, FRACTURES): Vit D, 25-Hydroxy: 20.1 ng/mL — ABNORMAL LOW (ref 30.0–100.0)

## 2023-04-29 ENCOUNTER — Other Ambulatory Visit: Payer: Self-pay | Admitting: Family Medicine

## 2023-04-29 DIAGNOSIS — E1165 Type 2 diabetes mellitus with hyperglycemia: Secondary | ICD-10-CM

## 2023-04-29 DIAGNOSIS — D509 Iron deficiency anemia, unspecified: Secondary | ICD-10-CM

## 2023-04-29 DIAGNOSIS — E038 Other specified hypothyroidism: Secondary | ICD-10-CM

## 2023-04-29 DIAGNOSIS — E785 Hyperlipidemia, unspecified: Secondary | ICD-10-CM

## 2023-04-29 DIAGNOSIS — E559 Vitamin D deficiency, unspecified: Secondary | ICD-10-CM

## 2023-04-29 DIAGNOSIS — E782 Mixed hyperlipidemia: Secondary | ICD-10-CM

## 2023-04-29 MED ORDER — EZETIMIBE 10 MG PO TABS
10.0000 mg | ORAL_TABLET | Freq: Every day | ORAL | 3 refills | Status: AC
Start: 2023-04-29 — End: ?

## 2023-04-29 MED ORDER — TRULICITY 3 MG/0.5ML ~~LOC~~ SOAJ: 3.0000 mg | SUBCUTANEOUS | 2 refills | Status: DC

## 2023-04-29 MED ORDER — VITAMIN D (ERGOCALCIFEROL) 1.25 MG (50000 UNIT) PO CAPS
50000.0000 [IU] | ORAL_CAPSULE | ORAL | 0 refills | Status: DC
Start: 2023-04-29 — End: 2023-05-17

## 2023-04-29 MED ORDER — EQL SLOW RELEASE IRON 160 (50 FE) MG PO TBCR: 160.0000 mg | EXTENDED_RELEASE_TABLET | Freq: Every day | ORAL | 3 refills | Status: DC

## 2023-04-29 MED ORDER — ATORVASTATIN CALCIUM 40 MG PO TABS
40.0000 mg | ORAL_TABLET | Freq: Every day | ORAL | 3 refills | Status: AC
Start: 2023-04-29 — End: ?

## 2023-05-08 ENCOUNTER — Telehealth: Payer: Self-pay | Admitting: Pharmacy Technician

## 2023-05-08 NOTE — Telephone Encounter (Signed)
Patient Advocate Encounter   Received notification that prior authorization for Aimovig 70MG /ML auto-injectors is required.   PA submitted on 05/08/2023 Key [id number] Insurance [employer] Medicare Part D Electronic Prior Authorization Form Status is pending

## 2023-05-10 ENCOUNTER — Ambulatory Visit
Admission: RE | Admit: 2023-05-10 | Discharge: 2023-05-10 | Disposition: A | Discharge status: Home / Self Care | Payer: Medicare Other | Source: Ambulatory Visit | Attending: Neurology | Admitting: Neurology

## 2023-05-10 DIAGNOSIS — G43709 Chronic migraine without aura, not intractable, without status migrainosus: Secondary | ICD-10-CM

## 2023-05-10 DIAGNOSIS — R5383 Other fatigue: Secondary | ICD-10-CM

## 2023-05-10 DIAGNOSIS — G4733 Obstructive sleep apnea (adult) (pediatric): Secondary | ICD-10-CM

## 2023-05-10 DIAGNOSIS — G35 Multiple sclerosis: Secondary | ICD-10-CM | POA: Diagnosis not present

## 2023-05-10 MED ORDER — GADOPICLENOL 0.5 MMOL/ML IV SOLN
10.0000 mL | Freq: Once | INTRAVENOUS | Status: AC | PRN
  Administered 2023-05-10: 10 mL via INTRAVENOUS

## 2023-05-10 NOTE — Telephone Encounter (Signed)
Patient Advocate Encounter  Prior Authorization for Aimovig 70MG /ML auto-injectors has been approved.    PA# [id number] Insurance [employer] Medicare Part D Electronic Prior Authorization Form Effective dates: 05/08/2023 through 12/18/2023

## 2023-05-16 NOTE — Progress Notes (Signed)
Please inform patient that MRI Brain showed evidence of Multiple sclerosis but there are no active disease and no new lesion when compared to last year MRI

## 2023-05-16 NOTE — Progress Notes (Signed)
Please inform patient that MRI cervical spine do not show any new lesions

## 2023-05-17 ENCOUNTER — Other Ambulatory Visit: Payer: Self-pay | Admitting: Family Medicine

## 2023-05-17 ENCOUNTER — Telehealth: Payer: Self-pay

## 2023-05-17 DIAGNOSIS — E559 Vitamin D deficiency, unspecified: Secondary | ICD-10-CM

## 2023-05-17 NOTE — Telephone Encounter (Signed)
-----   Message from Williemae Muriel Karsten Ro, RN sent at 05/17/2023 12:01 PM EDT -----  ----- Message ----- From: Windell Norfolk, MD Sent: 05/17/2023  11:09 AM EDT To: Denaja Verhoeven Karsten Ro, RN  If she worry about carpal tunnel, she needs to be evaluated, may be try to schedule her with the NP

## 2023-05-17 NOTE — Telephone Encounter (Signed)
call back to patient to offer wait list. No answer, left message to return call. Will advise  on seeing PCP or urgent care due to pain/numbness in arm if intolerable.

## 2023-05-17 NOTE — Progress Notes (Signed)
If she worry about carpal tunnel, she needs to be evaluated, may be try to schedule her with the NP

## 2023-06-05 ENCOUNTER — Encounter: Payer: Self-pay | Admitting: Neurology

## 2023-06-07 ENCOUNTER — Ambulatory Visit (INDEPENDENT_AMBULATORY_CARE_PROVIDER_SITE_OTHER): Payer: Medicare Other | Admitting: Neurology

## 2023-06-07 ENCOUNTER — Encounter: Payer: Self-pay | Admitting: Neurology

## 2023-06-07 VITALS — BP 148/83 | HR 85 | Ht 67.0 in | Wt 283.6 lb

## 2023-06-07 DIAGNOSIS — G35 Multiple sclerosis: Secondary | ICD-10-CM

## 2023-06-07 NOTE — Progress Notes (Signed)
ASSESSMENT AND PLAN 52 y.o. year old female   Relapsing remitting multiple sclerosis  Overall stable on ocrelizumab   MRI brain, cervical spine were stable, showed stable MS lesions, C3-C4 and C4-C5 and borderline spinal stenosis at C5-C6. There is no nerve root compression   2.  Worsening neck pain, left hand paresthesia  Likely left carpal tunnel syndromes, EMG nerve conduction study scheduled 07/18/23, will try to move up   Increase gabapentin 300 mg twice daily, start taking 600 mg at bedtime, also on trazodone at night   Also has Celebrex as needed    3.  Depression, chronic insomnia Cymbalta 60 mg daily   4.  Migraine headaches  Over under good control, only couple times each month   DIAGNOSTIC DATA (LABS, IMAGING, TESTING) - I reviewed patient records, labs, notes, testing and imaging myself where available.    HISTORY   Katrina Montes is 65 right-handed African-American female, accompanied by her mother, referred by her primary care physician Dr. Jeanice Lim for evaluation of right optic neuritis, abnormal MRI of the brain, suspicious for relapsing remitting multiple sclerosis, initial evaluation was on Jan 14 2015.   She had a past medical history of hypertension, diabetes, A1c was 11.9 in July 2015, most recent A1c was still high at 8.9, used to work as a Counselling psychologist job, which was out sourced recently,   In May of 2015, she had transient right eye blurry vision, was considered due to her high glucose, recovered within a few weeks   Since January 2016, she began to experience intermittent vertigo, dizziness, also had worsening right eye blurry vision, color washing out, could only see her rim at her right peripheral visual field.   Relapsing Remitting Multiple Sclerosis was diagnosed since January 2016: Based on her abnormal MRI of the brain, there was also cervical spinal cord involvement   MRI of the brain without contrast at [facility] imaging January 2016 showed multiple  periventricular white matter disease, significant involvement of corpus callosum, perpendicular to ventricle, consistent with multiple sclerosis, now was also suspicious for right optic nerve edema, with hyperintensity signal at T2, FLAIR, no contrast enhancement, MRA of brain was normal.   MRI of the cervical spine with and without contrast in Jan 2016: showing hyperintense foci within the spinal cord posteriorly at C4 and posteriolaterally at C7-T1 consistent with multiple sclerosis plaques. There were no enhancing foci.   Spinal fluid testing January 13 2015, total protein was 708, RBC was 1600, RBC was 7,, cloudy reddish-looking, elevated IgG 17.7, IgG index 0.97, IgG synthetic rate 238, 0 oligoclonal bands,   Visual evoked potential showed prolongation on the right side.   She was treated with IV Solu-Medrol for 3 days in Jan 2016, which only mild improved her right vision, has significant worsening of her blood glucose level.She was treated with Achtar injection in Feb 2016, but still having significant residual visual difficulty.   JC virus was positive with titer of 1.1 3 (Jan 14 2015),  laboratory evaluation showed normal or negative CPK, HIV, protein electrophoresis, positive varicella-zoster virus antibody, mild elevated ESR, C-reactive protein, hepatitis panel, Lyme titer, showed anemia, hemoglobin 11.8, otherwise normal CBC, normal CMP, with exception of elevated glucose, normal TSH,   She was treated with Tysarbril since March 14th, 2016, was switched to ocrelizumab since February 2020   MRI of the brain with and without contrast in April 2019 showed multiple supratentorium lesions, no contrast enhancement, no significant change compared to previous MRI  of the brain   Vision: History of right optic neuritis, with partial recovery, now complains of blurry vision, difficult to read anything without bleeders or magnified glasses, difficult to read through computer screen or watch TV,  visual acuity, OS 20/40, OD 20/50-1   Cognitive impairment: has to take frequent notes, difficulty to concentrate, to express herself.   Gait abnormality: Treated easily, generalized weakness, also complicated by her low back pain, left sided radicular pain,   Spasticity: Bilateral lower extremity spasticity, is taking gabapentin 300 mg 3 times a day previously tried and failed baclofen   Fatigue: Severe fatigue, also complicated by her depression anxiety chronic insomnia, body achy pain, limit her daily activity more than 50%, she has heavy menstruation-like cycle, chronic anemia, recent hemoglobin was 10.6.   Chronic migraine: She also has history of chronic migraine headaches, retro-orbital area severe pounding headache with associated light noise sensitivity, and happened frequently, up to 3 to four times each week, Maxalt as needed was helpful,   Severe low back pain, radiating pain to left lower extremity,  MRI of lumbar in July 201 there is minimum anterolisthesis of L4 upon L5, due to severe facet hypertrophy, associated with left ligamentum flavum hypertrophy and disc protrusion, there is moderate  Left foraminal and lateral recess stenosis, potential for left L4-5 nerve root compression.   Diabetic peripheral neuropathy, Progressive worsening bilateral feet paresthesia, now taking insulin   Depression anxiety, chronic insomnia, polypharmacy treatment:  Trazodone 50 mg every night, gabapentin 300 mg 3 times a day, Topamax 100 mg twice a day, Flexeril as needed   Weakness in hands: EMG nerve conduction study from outside clinic confirmed the diagnosis of severe right median neuropathy,   Urinary frequency: stable Hearing Loss-she reports occasional ringing in her ears, notices her hearing is not as good as it used to be, having to read lips, this is embarrassing to her   Mild obstructive sleep apnea, using CPAP machine,   Vitamin D deficiency: Vitamin D level was 23, on  supplement     Update 15 2022 She is tolerating ocrelizumab, today alone at visit, constellation of complaints, has been out of her insulin due to financial concerns, last A1c December 2021 was 12.3, continue complains of fatigue, depression, gait abnormality, frequent migraine headaches, mild improvement in her low back pain, there is no flareup of her MS symptoms, urinary urgency, mild gait abnormalities   We personally reviewed most recent MRI of the brain with without contrast in February 2021: Multiple T2/FLAIR hyperintensity in the hemisphere, left thalamus, brainstem, none of this is acute.  No contrast-enhancement     UPDATE April 03 2023 She tolerate ocrelizumab, seems to notice benefit only last 4 months, feeling fatigue, body achy pain 2 months prior to next infusion.  6 months history of  left arm issues, left hand numbness, pain, difficulty to use left hand,   EMG/NCS by Dr. Naaman Plummer in June 2020 showed severe left carpal tunnel syndrome.  She has no significant neck pain, no right arm pain, more balance issues, fell twice  She has noticed some speech change,   We personally reviewed MRI of the brain with without contrast March 2023, multiple supra and inferior tentorium chronic demyelinating plaque, no contrast-enhancement, no change from 2021  Update June 07, 2023 SS: May 2024 MRI of the brain showed stable MS lesions, no new lesions compared to March 2023 MRI.  MRI cervical spine showed stable MS lesion at C4 and C6/T1.  Mild spinal  stenosis at C3-4 and C4-5 no nerve root compression.  Scheduled for EMG/NCV 07/18/23. Here to be seen sooner, feels numbness to left arm, constant pain, on gabapentin 300 mg TID, ibuprofen, celebrex didn't help. Mainly left elbow down, all fingers, dropping things. Keeps her up at night. Last Ocrevus was 2 months ago.   REVIEW OF SYSTEMS: Out of a complete 14 system review of symptoms, the patient complains only of the following symptoms, and all  other reviewed systems are negative.  See HPI  PHYSICAL EXAM  Vitals:   04/03/23 1141  BP: 133/84  Pulse: 85  Weight: 268 lb (121.6 kg)  Height: 5\' 7"  (1.702 m)   Body mass index is 41.97 kg/m.   PHYSICAL EXAMNIATION:  Gen: NAD, conversant, well nourised, well groomed                      NEUROLOGICAL EXAM:  MENTAL STATUS: Speech/cognition: Awake, alert oriented to history taking and casual conversation  CRANIAL NERVES: CN II: Visual fields are full to confrontation.  Pupils are round equal and briskly reactive to light. CN III, IV, VI: extraocular movement are normal. No ptosis. CN V: Facial sensation is intact to soft touch  CN VII: Face is symmetric with normal eye closure and smile. CN VIII: Hearing is normal to casual conversation CN XI: Head turning and shoulder shrug are intact  MOTOR: Muscle strength is normal, weakness to left hand grip, hard to make fist due to stiffness hard to bend fingers. Noted to be using left hand normally to handle objects.  REFLEXES: Reflexes are 2+ and symmetric   SENSORY:  left wrist Tinel signs, intact to light touch to face, arms, legs  COORDINATION: Rapid alternating movements and fine finger movements are intact. There is no dysmetria on finger-to-nose and heel-knee-shin.    GAIT/STANCE: Need push-up to get up from seated position, mildly antalgic,   ALLERGIES: No Known Allergies  HOME MEDICATIONS: Outpatient Medications Prior to Visit  Medication Sig Dispense Refill   APPLE CIDER VINEGAR PO Take 1 tablet by mouth daily.     atorvastatin (LIPITOR) 40 MG tablet Take 1 tablet (40 mg total) by mouth daily. 90 tablet 3   celecoxib (CELEBREX) 100 MG capsule Take 1 capsule (100 mg total) by mouth 2 (two) times daily as needed for up to 420 doses. 60 capsule 6   cephALEXin (KEFLEX) 500 MG capsule Take 1 capsule (500 mg total) by mouth 4 (four) times daily. 20 capsule 0   cyclobenzaprine (FLEXERIL) 10 MG tablet Take 1 tablet  (10 mg total) by mouth 2 (two) times daily as needed for muscle spasms. 30 tablet 3   diclofenac Sodium (VOLTAREN ARTHRITIS PAIN) 1 % GEL 2 gram qid prn 100 g 11   diphenhydrAMINE (BENADRYL) 25 MG tablet Take 25 mg by mouth every 6 (six) hours as needed for allergies.     docusate sodium (COLACE) 50 MG capsule Take 1 capsule (50 mg total) by mouth 2 (two) times daily. (Patient taking differently: Take 50 mg by mouth daily as needed for moderate constipation.) 60 capsule 11   Dulaglutide (TRULICITY) 3 MG/0.5ML SOPN Inject 3 mg as directed once a week. 2 mL 2   DULoxetine (CYMBALTA) 60 MG capsule Take 1 capsule (60 mg total) by mouth daily. 90 capsule 3   ELDERBERRY PO Take 1,150 mg by mouth daily.     Erenumab-aooe (AIMOVIG) 70 MG/ML SOAJ Inject 70 mg into the skin every 30 (thirty) days.  1.12 mL 11   ezetimibe (ZETIA) 10 MG tablet Take 1 tablet (10 mg total) by mouth daily. 90 tablet 3   ferrous sulfate (EQL SLOW RELEASE IRON) 160 (50 Fe) MG TBCR SR tablet Take 1 tablet (160 mg total) by mouth daily. 30 tablet 3   gabapentin (NEURONTIN) 300 MG capsule Take 1 capsule (300 mg total) by mouth 3 (three) times daily. 90 capsule 11   ibuprofen (ADVIL,MOTRIN) 200 MG tablet Take 200 mg by mouth as needed for headache, moderate pain or mild pain.     lisinopril (ZESTRIL) 20 MG tablet Take 1 tablet (20 mg total) by mouth daily. 30 tablet 11   meclizine (ANTIVERT) 25 MG tablet Take 1 tablet (25 mg total) by mouth 3 (three) times daily as needed for dizziness. 30 tablet 6   metFORMIN (GLUCOPHAGE) 500 MG tablet Take 1 tablet (500 mg total) by mouth 2 (two) times daily with a meal. 60 tablet 2   Multiple Vitamin (MULTIVITAMIN) capsule Take 1 capsule by mouth daily.     mupirocin ointment (BACTROBAN) 2 % Apply 1 Application topically 2 (two) times daily. 30 g 1   ocrelizumab (OCREVUS) 300 MG/10ML injection Inject 300 mg into the vein every 6 (six) months.     polyethylene glycol powder (GLYCOLAX/MIRALAX) 17  GM/SCOOP powder Take 1 capful (17 g) in water by mouth daily. (Patient taking differently: Take 17 g by mouth daily as needed for moderate constipation.) 238 g 0   polyethylene glycol-electrolytes (NULYTELY) 420 g solution As directed 4000 mL 0   PRESCRIPTION MEDICATION Place 1 drop into both eyes at bedtime.     rizatriptan (MAXALT) 10 MG tablet Take 1 tablet (10 mg total) by mouth as needed for migraine. May repeat in 2 hours if needed. Max dose: 2/24 hr or 15/30 days. 45 tablet 3   traZODone (DESYREL) 50 MG tablet Take 2 tablets (100 mg total) by mouth at bedtime. 60 tablet 11   triamcinolone cream (KENALOG) 0.1 % Apply 1 Application topically 2 (two) times daily. 30 g 0   Vitamin D, Ergocalciferol, (DRISDOL) 1.25 MG (50000 UNIT) CAPS capsule TAKE 1 CAPSULE BY MOUTH EVERY 7 DAYS 5 capsule 0   No facility-administered medications prior to visit.    PAST MEDICAL HISTORY: Past Medical History:  Diagnosis Date   Anemia    Anxiety    Back pain    Carpal tunnel syndrome    Constipation    Depression    Diabetes mellitus    Diabetes mellitus, type II (HCC)    Hyperlipidemia    Hypertension    Joint pain    Lactose intolerance    Multiple sclerosis (HCC) 2016   Optic neuritis    Shortness of breath    Sleep apnea    Swallowing difficulty    Swelling of both lower extremities    Vision, loss, sudden    Vitamin D deficiency     PAST SURGICAL HISTORY: Past Surgical History:  Procedure Laterality Date   BREAST REDUCTION SURGERY     COLONOSCOPY WITH PROPOFOL N/A 06/02/2022   Procedure: COLONOSCOPY WITH PROPOFOL;  Surgeon: Corbin Ade, MD;  Location: [facility];  Service: Endoscopy;  Laterality: N/A;  8:45AM   CYST EXCISION     INCISION AND DRAINAGE ABSCESS Left 12/23/2021   Procedure: INCISION AND DRAINAGE BUTTOCK ABSCESS;  Surgeon: Almond Lint, MD;  Location: [facility] OR;  Service: General;  Laterality: Left;   ovarian cyst removed     TUBAL  LIGATION     WISDOM TOOTH EXTRACTION       FAMILY HISTORY: Family History  Problem Relation Age of Onset   Cancer Mother    Diabetes Mother    Hearing loss Mother    Thyroid nodules Mother    Anemia Mother    Kidney disease Mother    Hypertension Mother    Obesity Mother    Breast cancer Mother    Hyperlipidemia Father    High blood pressure Father    Sleep apnea Father    Pancreatic cancer Father    Anemia Sister    Anemia Sister    Arthritis Maternal Grandmother    Arthritis Maternal Grandfather    Arthritis Paternal Grandmother    Kidney disease Paternal Grandmother    Hypertension Paternal Grandmother    Arthritis Paternal Grandfather    Vision loss Paternal Grandfather    Stroke Paternal Grandfather    Hyperlipidemia Paternal Grandfather    Pancreatic cancer Maternal Uncle    Kidney disease Paternal Uncle    Colon cancer Neg Hx    Colon polyps Neg Hx     SOCIAL HISTORY: Social History   Socioeconomic History   Marital status: Single    Spouse name: Not on file   Number of children: 0   Years of education: College   Highest education level: Bachelor's degree (e.g., BA, AB, BS)  Occupational History   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   Right handed.   Social Determinants of Health   Financial Resource Strain: Medium Risk (03/02/2023)   Overall Financial Resource Strain (CARDIA)    Difficulty of Paying Living Expenses: Somewhat hard  Food Insecurity: Food Insecurity Present (04/24/2023)   Hunger Vital Sign    Worried About Running Out of Food in the Last Year: Sometimes true    Ran Out of Food in the Last Year: Sometimes true  Transportation Needs: No Transportation Needs (04/24/2023)   PRAPARE -  Administrator, Civil Service (Medical): No    Lack of Transportation (Non-Medical): No  Physical Activity: Insufficiently Active (04/24/2023)   Exercise Vital Sign    Days of Exercise per Week: 3 days    Minutes of Exercise per Session: 20 min  Stress: Stress Concern Present (04/24/2023)   [employer] of Occupational Health - Occupational Stress Questionnaire    Feeling of Stress : Very much  Social Connections: Moderately Integrated (04/24/2023)   Social Connection and Isolation Panel [NHANES]    Frequency of Communication with Friends and Family: More than three times a week    Frequency of Social Gatherings with Friends and Family: Once a week    Attends Religious Services: More than 4 times per year    Active Member of [employer] or Organizations: Yes    Attends Banker Meetings: More than 4 times per year    Marital Status: Never married  Intimate Partner Violence: Not At Risk (03/05/2023)   Humiliation, Afraid, Rape, and Kick questionnaire    Fear of Current or Ex-Partner: No    Emotionally Abused: No    Physically Abused: No    Sexually Abused:  No   Otila Kluver, DNP  [facility] [address] [place], [place] [address] [phone]

## 2023-06-11 ENCOUNTER — Other Ambulatory Visit: Payer: Self-pay | Admitting: Nurse Practitioner

## 2023-06-15 ENCOUNTER — Other Ambulatory Visit: Payer: Self-pay | Admitting: Family Medicine

## 2023-06-15 DIAGNOSIS — E559 Vitamin D deficiency, unspecified: Secondary | ICD-10-CM

## 2023-06-15 MED ORDER — VITAMIN D (ERGOCALCIFEROL) 1.25 MG (50000 UNIT) PO CAPS: ORAL_CAPSULE | ORAL | 0 refills | Status: DC

## 2023-06-27 ENCOUNTER — Ambulatory Visit (INDEPENDENT_AMBULATORY_CARE_PROVIDER_SITE_OTHER): Payer: Medicare Other | Admitting: Neurology

## 2023-06-27 ENCOUNTER — Telehealth: Payer: Self-pay | Admitting: Neurology

## 2023-06-27 ENCOUNTER — Ambulatory Visit: Payer: Self-pay | Admitting: Neurology

## 2023-06-27 DIAGNOSIS — G35 Multiple sclerosis: Secondary | ICD-10-CM | POA: Diagnosis not present

## 2023-06-27 DIAGNOSIS — G4733 Obstructive sleep apnea (adult) (pediatric): Secondary | ICD-10-CM

## 2023-06-27 DIAGNOSIS — R5383 Other fatigue: Secondary | ICD-10-CM

## 2023-06-27 DIAGNOSIS — G43709 Chronic migraine without aura, not intractable, without status migrainosus: Secondary | ICD-10-CM

## 2023-06-27 DIAGNOSIS — G5603 Carpal tunnel syndrome, bilateral upper limbs: Secondary | ICD-10-CM

## 2023-06-27 DIAGNOSIS — G629 Polyneuropathy, unspecified: Secondary | ICD-10-CM

## 2023-06-27 DIAGNOSIS — Z0289 Encounter for other administrative examinations: Secondary | ICD-10-CM

## 2023-06-27 NOTE — Telephone Encounter (Addendum)
Referral for hand surgery sent to [facility]. Phone: [phone], Fax: [phone]

## 2023-06-27 NOTE — Progress Notes (Signed)
EMG nerve/nerve conduction study showed evidence of bilateral carpal tunnel syndromes, right side is moderate, left side is severe, demyelinating in nature, but symptoms is very bothersome for her, will refer to hand surgeon

## 2023-06-28 NOTE — Procedures (Signed)
Full Name: Katrina Montes Gender: Female MRN #: [id number] Date of Birth: 07-Dec-1971    Visit Date: 06/27/2023 07:37 Age: 52 Years Examining Physician: Dr. Levert Feinstein Referring Physician: Margie Ege, NP Height: 5 feet 7 inch History: 52 year old female, presenting with worsening bilateral hands paresthesia  Summary of the test: Nerve conduction study: Left sural, superficial peroneal, bilateral median sensory responses were absent.  Right ulnar sensory response was absent.  Left ulnar sensory response showed mildly decreased snap amplitude.  Left radial sensory responses were within normal limit.  Left tibial, peroneal to EDB motor response showed mild to moderately decreased CMAP amplitude.  Bilateral ulnar motor responses showed slightly decreased CMAP amplitude, right ulnar motor response showed 14 mm/s amplitude drop across right elbow  Right median motor responses showed moderately prolonged distal latency with preserved CMAP amplitude  Left median motor responses showed significantly decreased CMAP amplitude  Electromyography:  Selected needle examinations of bilateral upper extremity muscles and cervical paraspinal muscles were performed.  There was mild increased insertional activity with mildly decreased recruitment at left abductor pollicis brevis, there was no evidence of active denervation.   Conclusion: This is an abnormal study.  There is electrodiagnostic evidence of bilateral median neuropathy across the wrist, right side is moderate, left side is severe, demyelinating in nature, no evidence of axonal loss.  This happened in the setting of length-dependent mild to moderate sensorimotor polyneuropathy.    ------------------------------- Levert Feinstein, M.D.PH.D.  [facility] [address], [address] [place], [place] [address] Tel: [phone] Fax: [phone]  Verbal informed consent was obtained from the patient, patient was  informed of potential risk of procedure, including bruising, bleeding, hematoma formation, infection, muscle weakness, muscle pain, numbness, among others.        MNC    Nerve / Sites Muscle Latency Ref. Amplitude Ref. Rel Amp Segments Distance Velocity Ref. Area    ms ms mV mV %  cm m/s m/s mVms  L Median - APB     Wrist APB 11.6 ?4.4 0.3 ?4.0 100 Wrist - APB 7   0.3     Upper arm APB 14.5  1.5  434 Upper arm - Wrist 28.6 97 ?49 9.8  R Median - APB     Wrist APB 6.5 ?4.4 4.5 ?4.0 100 Wrist - APB 7   20.8     Upper arm APB 12.6  3.9  87.1 Upper arm - Wrist 27 44 ?49 20.1  L Ulnar - ADM     Wrist ADM 3.1 ?3.3 5.5 ?6.0 100 Wrist - ADM 7   21.4     B.Elbow ADM 6.0  4.9  90.6 B.Elbow - Wrist 14 49 ?49 20.3     A.Elbow ADM 9.5  4.4  88.3 A.Elbow - B.Elbow 16 45 ?49 19.9  R Ulnar - ADM     Wrist ADM 3.1 ?3.3 5.0 ?6.0 100 Wrist - ADM 7   19.0     B.Elbow ADM 5.8  4.3  86.5 B.Elbow - Wrist 13.6 51 ?49 17.8     A.Elbow ADM 10.5  3.5  82.2 A.Elbow - B.Elbow 17 37 ?49 14.5  L Peroneal - EDB     Ankle EDB 5.4 ?6.5 1.7 ?2.0 100 Ankle - EDB 9   5.7     Fib head EDB 12.9  1.3  76.9 Fib head - Ankle 28 38 ?44 4.8     Pop fossa EDB 15.9  1.2  86.9 Pop  fossa - Fib head 10 33 ?44 4.4         Pop fossa - Ankle      L Tibial - AH     Ankle AH 7.9 ?5.8 1.4 ?4.0 100 Ankle - AH 9   2.6     Pop fossa AH 17.0  1.4  103 Pop fossa - Ankle 37 41 ?41 2.6                 SNC    Nerve / Sites Rec. Site Peak Lat Ref.  Amp Ref. Segments Distance    ms ms V V  cm  L Radial - Anatomical snuff box (Forearm)     Forearm Wrist 2.4 ?2.9 21 ?15 Forearm - Wrist 10  R Radial - Anatomical snuff box (Forearm)     Forearm Wrist 2.5 ?2.9 21 ?15 Forearm - Wrist 10  L Sural - Ankle (Calf)     Calf Ankle NR ?4.4 NR ?6 Calf - Ankle 14  L Superficial peroneal - Ankle     Lat leg Ankle NR ?4.4 NR ?6 Lat leg - Ankle 14  L Median - Orthodromic (Dig II, Mid palm)     Dig II Wrist NR ?3.4 NR ?10 Dig II - Wrist 13  R Median -  Orthodromic (Dig II, Mid palm)     Dig II Wrist NR ?3.4 NR ?10 Dig II - Wrist 13  L Ulnar - Orthodromic, (Dig V, Mid palm)     Dig V Wrist 3.1 ?3.1 4 ?5 Dig V - Wrist 11  R Ulnar - Orthodromic, (Dig V, Mid palm)     Dig V Wrist NR ?3.1 NR ?5 Dig V - Wrist 44                     F  Wave    Nerve F Lat Ref.   ms ms  L Ulnar - ADM 34.4 ?32.0  R Ulnar - ADM 34.8 ?32.0  L Tibial - AH NR ?56.0           EMG Summary Table    Spontaneous MUAP Recruitment  Muscle IA Fib PSW Fasc Other Amp Dur. Poly Pattern  L. Abductor pollicis brevis Increased None None None _______ Normal Normal Normal Reduced  L. First dorsal interosseous Normal None None None _______ Normal Normal Normal Normal  L. Pronator teres Normal None None None _______ Normal Normal Normal Normal  L. Biceps brachii Normal None None None _______ Normal Normal Normal Normal  L. Deltoid Normal None None None _______ Normal Normal Normal Normal  L. Triceps brachii Normal None None None _______ Normal Normal Normal Normal  L. Extensor digitorum communis Normal None None None _______ Normal Normal Normal Normal  L. Cervical paraspinals Normal None None None _______ Normal Normal Normal Normal  R. First dorsal interosseous Normal None None None _______ Normal Normal Normal Normal  R. Pronator teres Normal None None None _______ Normal Normal Normal Normal  R. Deltoid Normal None None None _______ Normal Normal Normal Normal  R. Biceps brachii Normal None None None _______ Normal Normal Normal Normal  R. Brachioradialis Normal None None None _______ Normal Normal Normal Normal  R. Abductor pollicis brevis Normal None None None _______ Normal Normal Normal Normal

## 2023-07-09 ENCOUNTER — Encounter: Payer: Self-pay | Admitting: Neurology

## 2023-07-09 ENCOUNTER — Encounter: Payer: Self-pay | Admitting: Orthopaedic Surgery

## 2023-07-11 ENCOUNTER — Other Ambulatory Visit: Payer: Self-pay | Admitting: "Endocrinology

## 2023-07-18 ENCOUNTER — Encounter: Payer: Medicare Other | Admitting: Neurology

## 2023-07-26 ENCOUNTER — Ambulatory Visit (INDEPENDENT_AMBULATORY_CARE_PROVIDER_SITE_OTHER): Payer: Medicare Other | Admitting: Family Medicine

## 2023-07-26 ENCOUNTER — Encounter: Payer: Self-pay | Admitting: Family Medicine

## 2023-07-26 ENCOUNTER — Other Ambulatory Visit: Payer: Self-pay

## 2023-07-26 VITALS — BP 137/82 | HR 83 | Ht 66.5 in | Wt 289.1 lb

## 2023-07-26 DIAGNOSIS — I10 Essential (primary) hypertension: Secondary | ICD-10-CM | POA: Diagnosis not present

## 2023-07-26 DIAGNOSIS — E7849 Other hyperlipidemia: Secondary | ICD-10-CM

## 2023-07-26 DIAGNOSIS — E559 Vitamin D deficiency, unspecified: Secondary | ICD-10-CM

## 2023-07-26 DIAGNOSIS — E782 Mixed hyperlipidemia: Secondary | ICD-10-CM | POA: Diagnosis not present

## 2023-07-26 DIAGNOSIS — E1165 Type 2 diabetes mellitus with hyperglycemia: Secondary | ICD-10-CM

## 2023-07-26 DIAGNOSIS — E038 Other specified hypothyroidism: Secondary | ICD-10-CM | POA: Diagnosis not present

## 2023-07-26 DIAGNOSIS — Z7984 Long term (current) use of oral hypoglycemic drugs: Secondary | ICD-10-CM

## 2023-07-26 DIAGNOSIS — R7301 Impaired fasting glucose: Secondary | ICD-10-CM

## 2023-07-26 MED ORDER — TRULICITY 3 MG/0.5ML ~~LOC~~ SOAJ: 3.0000 mg | SUBCUTANEOUS | 2 refills | Status: DC

## 2023-07-26 MED ORDER — TRULICITY 3 MG/0.5ML ~~LOC~~ SOAJ
3.0000 mg | SUBCUTANEOUS | 2 refills | Status: DC
Start: 2023-07-26 — End: 2023-07-29

## 2023-07-26 NOTE — Assessment & Plan Note (Signed)
Patient is taking Lipitor 40 mg daily and ezetimibe 10 mg daily Encouraged Increasing her intake of foods rich in fruits, vegetables, whole grains Lean proteins: chicken, fish, beans, legumes Low Fat dairy products Reduced intake of saturated fats, trans fatty acids, cholesterol Aim to be active at least 5 days a week for 30 minutes each day ( walking briskly)   Lab Results  Component Value Date   CHOL 222 (H) 04/25/2023   HDL 84 04/25/2023   LDLCALC 117 (H) 04/25/2023   TRIG 122 04/25/2023   CHOLHDL 2.6 04/25/2023

## 2023-07-26 NOTE — Progress Notes (Signed)
Established Patient Office Visit  Subjective:  Patient ID: Katrina Montes, female    DOB: May 04, 1971  Age: 52 y.o. MRN: [id number]  CC:  Chief Complaint  Patient presents with   Care Management    3 month f/u    HPI Katrina Montes is a 52 y.o. female with past medical history of  presents for f/u of HTN, HLP, and T2DM chronic medical conditions. For the details of today's visit, please refer to the assessment and plan.     Past Medical History:  Diagnosis Date   Anemia    Anxiety    Back pain    Carpal tunnel syndrome    Constipation    Depression    Diabetes mellitus    Diabetes mellitus, type II (HCC)    Hyperlipidemia    Hypertension    Joint pain    Lactose intolerance    Multiple sclerosis (HCC) 2016   Optic neuritis    Shortness of breath    Sleep apnea    Swallowing difficulty    Swelling of both lower extremities    Vision, loss, sudden    Vitamin D deficiency     Past Surgical History:  Procedure Laterality Date   BREAST REDUCTION SURGERY     COLONOSCOPY WITH PROPOFOL N/A 06/02/2022   Procedure: COLONOSCOPY WITH PROPOFOL;  Surgeon: Corbin Ade, MD;  Location: [facility];  Service: Endoscopy;  Laterality: N/A;  8:45AM   CYST EXCISION     INCISION AND DRAINAGE ABSCESS Left 12/23/2021   Procedure: INCISION AND DRAINAGE BUTTOCK ABSCESS;  Surgeon: Almond Lint, MD;  Location: [facility] OR;  Service: General;  Laterality: Left;   ovarian cyst removed     TUBAL LIGATION     WISDOM TOOTH EXTRACTION      Family History  Problem Relation Age of Onset   Cancer Mother    Diabetes Mother    Hearing loss Mother    Thyroid nodules Mother    Anemia Mother    Kidney disease Mother    Hypertension Mother    Obesity Mother    Breast cancer Mother    Hyperlipidemia Father    High blood pressure Father    Sleep apnea Father    Pancreatic cancer Father    Anemia Sister    Anemia Sister    Arthritis Maternal Grandmother    Arthritis Maternal  Grandfather    Arthritis Paternal Grandmother    Kidney disease Paternal Grandmother    Hypertension Paternal Grandmother    Arthritis Paternal Grandfather    Vision loss Paternal Grandfather    Stroke Paternal Grandfather    Hyperlipidemia Paternal Grandfather    Pancreatic cancer Maternal Uncle    Kidney disease Paternal Uncle    Colon cancer Neg Hx    Colon polyps Neg Hx     Social History   Socioeconomic History   Marital status: Single    Spouse name: Not on file   Number of children: 0   Years of education: College   Highest education level: Bachelor's degree (e.g., BA, AB, BS)  Occupational History   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   Right handed.   Social Determinants of Health   Financial Resource Strain: Medium Risk (03/02/2023)   Overall Financial Resource Strain (CARDIA)    Difficulty of Paying Living Expenses: Somewhat hard  Food Insecurity: Food Insecurity Present (04/24/2023)   Hunger Vital Sign    Worried About Running Out of Food in the Last Year: Sometimes true    Ran Out of Food in the Last Year: Sometimes true  Transportation Needs: No Transportation Needs (04/24/2023)   PRAPARE - Administrator, Civil Service (Medical): No    Lack of Transportation (Non-Medical): No  Physical Activity: Insufficiently Active (04/24/2023)   Exercise Vital Sign    Days of Exercise per Week: 3 days    Minutes of Exercise per Session: 20 min  Stress: Stress Concern Present (04/24/2023)   [employer] of Occupational Health - Occupational Stress Questionnaire    Feeling of Stress : Very much  Social Connections: Moderately Integrated (04/24/2023)   Social Connection and  Isolation Panel [NHANES]    Frequency of Communication with Friends and Family: More than three times a week    Frequency of Social Gatherings with Friends and Family: Once a week    Attends Religious Services: More than 4 times per year    Active Member of [employer] or Organizations: Yes    Attends Banker Meetings: More than 4 times per year    Marital Status: Never married  Intimate Partner Violence: Not At Risk (03/05/2023)   Humiliation, Afraid, Rape, and Kick questionnaire    Fear of Current or Ex-Partner: No    Emotionally Abused: No    Physically Abused: No    Sexually Abused: No    Outpatient Medications Prior to Visit  Medication Sig Dispense Refill   APPLE CIDER VINEGAR PO Take 1 tablet by mouth daily.     atorvastatin (LIPITOR) 40 MG tablet Take 1 tablet (40 mg total) by mouth daily. 90 tablet 3   celecoxib (CELEBREX) 100 MG capsule Take 1 capsule (100 mg total) by mouth 2 (two) times daily as needed for up to 420 doses. 60 capsule 6   cephALEXin (KEFLEX) 500 MG capsule Take 1 capsule (500 mg total) by mouth 4 (four) times daily. 20 capsule 0   cyclobenzaprine (FLEXERIL) 10 MG tablet Take 1 tablet (10 mg total) by mouth 2 (two) times daily as needed for muscle spasms. 30 tablet 3   diclofenac Sodium (VOLTAREN ARTHRITIS PAIN) 1 % GEL 2 gram qid prn 100 g 11   diphenhydrAMINE (BENADRYL) 25 MG tablet Take 25 mg by mouth every 6 (six) hours as needed for allergies.     docusate sodium (COLACE) 50 MG capsule Take 1 capsule (50 mg total) by mouth 2 (two) times daily. (Patient taking differently: Take 50 mg by mouth daily as needed for moderate constipation.) 60 capsule 11   DULoxetine (CYMBALTA) 60 MG capsule Take 1 capsule (60 mg total) by mouth daily. 90 capsule 3   ELDERBERRY PO Take 1,150 mg by mouth daily.     Erenumab-aooe (AIMOVIG) 70 MG/ML SOAJ Inject 70 mg into the skin every 30 (thirty) days. 1.12 mL 11   ezetimibe (ZETIA) 10 MG tablet Take 1 tablet (10 mg total)  by mouth daily. 90 tablet 3   ferrous sulfate (EQL SLOW RELEASE IRON) 160 (50 Fe) MG TBCR SR tablet Take 1 tablet (160 mg total) by mouth daily. 30 tablet 3   gabapentin (NEURONTIN) 300 MG capsule Take  1 capsule (300 mg total) by mouth 3 (three) times daily. 90 capsule 11   ibuprofen (ADVIL,MOTRIN) 200 MG tablet Take 200 mg by mouth as needed for headache, moderate pain or mild pain.     lisinopril (ZESTRIL) 20 MG tablet TAKE 1 TABLET(20 MG) BY MOUTH DAILY 30 tablet 11   meclizine (ANTIVERT) 25 MG tablet Take 1 tablet (25 mg total) by mouth 3 (three) times daily as needed for dizziness. 30 tablet 6   metFORMIN (GLUCOPHAGE) 500 MG tablet Take 1 tablet (500 mg total) by mouth 2 (two) times daily with a meal. 60 tablet 2   Multiple Vitamin (MULTIVITAMIN) capsule Take 1 capsule by mouth daily.     mupirocin ointment (BACTROBAN) 2 % Apply 1 Application topically 2 (two) times daily. 30 g 1   ocrelizumab (OCREVUS) 300 MG/10ML injection Inject 300 mg into the vein every 6 (six) months.     polyethylene glycol powder (GLYCOLAX/MIRALAX) 17 GM/SCOOP powder Take 1 capful (17 g) in water by mouth daily. (Patient taking differently: Take 17 g by mouth daily as needed for moderate constipation.) 238 g 0   polyethylene glycol-electrolytes (NULYTELY) 420 g solution As directed 4000 mL 0   PRESCRIPTION MEDICATION Place 1 drop into both eyes at bedtime.     rizatriptan (MAXALT) 10 MG tablet Take 1 tablet (10 mg total) by mouth as needed for migraine. May repeat in 2 hours if needed. Max dose: 2/24 hr or 15/30 days. 45 tablet 3   traZODone (DESYREL) 50 MG tablet Take 2 tablets (100 mg total) by mouth at bedtime. 60 tablet 11   triamcinolone cream (KENALOG) 0.1 % Apply 1 Application topically 2 (two) times daily. 30 g 0   Vitamin D, Ergocalciferol, (DRISDOL) 1.25 MG (50000 UNIT) CAPS capsule TAKE ONE CAPSULE BY MOUTH EVERY 7 DAYS 20 capsule 0   Dulaglutide (TRULICITY) 3 MG/0.5ML SOPN Inject 3 mg as directed once a  week. 2 mL 2   No facility-administered medications prior to visit.    No Known Allergies  ROS Review of Systems  Constitutional:  Negative for chills and fever.  Eyes:  Negative for visual disturbance.  Respiratory:  Negative for chest tightness and shortness of breath.   Neurological:  Negative for dizziness and headaches.      Objective:    Physical Exam HENT:     Head: Normocephalic.     Mouth/Throat:     Mouth: Mucous membranes are moist.  Cardiovascular:     Rate and Rhythm: Normal rate.     Heart sounds: Normal heart sounds.  Pulmonary:     Effort: Pulmonary effort is normal.     Breath sounds: Normal breath sounds.  Neurological:     Mental Status: She is alert.     BP 137/82   Pulse 83   Ht 5' 6.5" (1.689 m)   Wt 289 lb 1.3 oz (131.1 kg)   SpO2 96%   BMI 45.96 kg/m  Wt Readings from Last 3 Encounters:  07/26/23 289 lb 1.3 oz (131.1 kg)  06/27/23 283 lb (128.4 kg)  06/07/23 283 lb 9.6 oz (128.6 kg)    Lab Results  Component Value Date   TSH 1.080 04/25/2023   Lab Results  Component Value Date   WBC 6.6 04/25/2023   HGB 10.9 (L) 04/25/2023   HCT 35.2 04/25/2023   MCV 83 04/25/2023   PLT 407 04/25/2023   Lab Results  Component Value Date   NA 140 04/25/2023   K  4.5 04/25/2023   CO2 21 04/25/2023   GLUCOSE 178 (H) 04/25/2023   BUN 12 04/25/2023   CREATININE 0.65 04/25/2023   BILITOT 0.3 04/25/2023   ALKPHOS 107 04/25/2023   AST 12 04/25/2023   ALT 10 04/25/2023   PROT 7.1 04/25/2023   ALBUMIN 4.2 04/25/2023   CALCIUM 9.0 04/25/2023   ANIONGAP 7 12/26/2021   EGFR 107 04/25/2023   Lab Results  Component Value Date   CHOL 222 (H) 04/25/2023   Lab Results  Component Value Date   HDL 84 04/25/2023   Lab Results  Component Value Date   LDLCALC 117 (H) 04/25/2023   Lab Results  Component Value Date   TRIG 122 04/25/2023   Lab Results  Component Value Date   CHOLHDL 2.6 04/25/2023   Lab Results  Component Value Date    HGBA1C 8.7 (H) 04/25/2023      Assessment & Plan:  Uncontrolled type 2 diabetes mellitus with hyperglycemia (HCC) Assessment & Plan:  Reports that Trulicity has been on backorder since her last visit, and she has not been taking the medication. She has been compliant with metformin 500 mg BID. She reports her highest blood sugar levels in the 200s and the lowest in the 170s.  Trulicity has been refilled, and she was advised to inform me if the medication is on backorder so the prescription can be sent to [facility]. Encouraged her to reduce her intake of high-sugar foods and beverages and to increase physical activity.  Lab Results  Component Value Date   HGBA1C 8.7 (H) 04/25/2023        Orders: -     Trulicity; Inject 3 mg as directed once a week.  Dispense: 2 mL; Refill: 2  Essential hypertension, benign Assessment & Plan: Controlled She denies headaches, dizziness, blurred vision, and chest pain She is stable on lisinopril 20 mg daily Encouraged low sodium diet with increased physical activity BP Readings from Last 3 Encounters:  07/26/23 137/82  06/27/23 139/89  06/07/23 (!) 148/83      Mixed hyperlipidemia Assessment & Plan: Patient is taking Lipitor 40 mg daily and ezetimibe 10 mg daily Encouraged Increasing her intake of foods rich in fruits, vegetables, whole grains Lean proteins: chicken, fish, beans, legumes Low Fat dairy products Reduced intake of saturated fats, trans fatty acids, cholesterol Aim to be active at least 5 days a week for 30 minutes each day ( walking briskly)   Lab Results  Component Value Date   CHOL 222 (H) 04/25/2023   HDL 84 04/25/2023   LDLCALC 117 (H) 04/25/2023   TRIG 122 04/25/2023   CHOLHDL 2.6 04/25/2023      IFG (impaired fasting glucose) -     Microalbumin / creatinine urine ratio -     Hemoglobin A1c  Vitamin D deficiency -     VITAMIN D 25 Hydroxy (Vit-D Deficiency, Fractures)  Other specified hypothyroidism -      TSH + free T4  Other hyperlipidemia -     Lipid panel -     CMP14+EGFR -     CBC with Differential/Platelet  Note: This chart has been completed using Engineer, civil (consulting) software, and while attempts have been made to ensure accuracy, certain words and phrases may not be transcribed as intended.    Follow-up: Return in about 4 months (around 11/25/2023).   Gilmore Laroche, FNP

## 2023-07-26 NOTE — Assessment & Plan Note (Signed)
Controlled She denies headaches, dizziness, blurred vision, and chest pain She is stable on lisinopril 20 mg daily Encouraged low sodium diet with increased physical activity BP Readings from Last 3 Encounters:  07/26/23 137/82  06/27/23 139/89  06/07/23 (!) 148/83

## 2023-07-26 NOTE — Assessment & Plan Note (Addendum)
  Reports that Trulicity has been on backorder since her last visit, and she has not been taking the medication. She has been compliant with metformin 500 mg BID. She reports her highest blood sugar levels in the 200s and the lowest in the 170s.  Trulicity has been refilled, and she was advised to inform me if the medication is on backorder so the prescription can be sent to [facility]. Encouraged her to reduce her intake of high-sugar foods and beverages and to increase physical activity.  Lab Results  Component Value Date   HGBA1C 8.7 (H) 04/25/2023

## 2023-07-26 NOTE — Patient Instructions (Addendum)
I appreciate the opportunity to provide care to you today!    Follow up:  4 months  Fasting Labs: please stop by next month  to get your blood drawn (CBC, CMP, TSH, Lipid profile, HgA1c, Vit D)  Goal blood sugars:  Fasting blood sugars: 80-130 Before lunch and dinner: less than 140 2 hours after meals: less than 180   Attached with your AVS, you will find valuable resources for self-education. I highly recommend dedicating some time to thoroughly examine them.   Please continue to a heart-healthy diet and increase your physical activities. Try to exercise for at least five days a week.    It was a pleasure to see you and I look forward to continuing to work together on your health and well-being. Please do not hesitate to call the office if you need care or have questions about your care.  In case of emergency, please visit the Emergency Department for urgent care, or contact our clinic at [phone] to schedule an appointment. We're here to help you!   Have a wonderful day and week. With Gratitude, Gilmore Laroche MSN, FNP-BC

## 2023-07-29 ENCOUNTER — Other Ambulatory Visit: Payer: Self-pay | Admitting: Family Medicine

## 2023-07-29 ENCOUNTER — Other Ambulatory Visit: Payer: Self-pay

## 2023-07-29 DIAGNOSIS — E1165 Type 2 diabetes mellitus with hyperglycemia: Secondary | ICD-10-CM

## 2023-07-29 MED ORDER — TRULICITY 3 MG/0.5ML ~~LOC~~ SOAJ
3.0000 mg | SUBCUTANEOUS | 2 refills | Status: DC
Start: 2023-07-29 — End: 2023-10-02
  Filled 2023-07-29: qty 2, 28d supply, fill #0

## 2023-07-30 ENCOUNTER — Other Ambulatory Visit: Payer: Self-pay

## 2023-08-09 DIAGNOSIS — R200 Anesthesia of skin: Secondary | ICD-10-CM | POA: Diagnosis not present

## 2023-08-24 DIAGNOSIS — E559 Vitamin D deficiency, unspecified: Secondary | ICD-10-CM | POA: Diagnosis not present

## 2023-08-24 DIAGNOSIS — E782 Mixed hyperlipidemia: Secondary | ICD-10-CM | POA: Diagnosis not present

## 2023-08-24 DIAGNOSIS — I10 Essential (primary) hypertension: Secondary | ICD-10-CM | POA: Diagnosis not present

## 2023-08-25 LAB — COMPREHENSIVE METABOLIC PANEL
Globulin, Total: 3 g/dL (ref 1.5–4.5)
eGFR: 81 mL/min/{1.73_m2} (ref >59)

## 2023-08-25 LAB — T4, FREE: Free T4: 1.05 ng/dL (ref 0.82–1.77)

## 2023-08-25 LAB — VITAMIN D 25 HYDROXY (VIT D DEFICIENCY, FRACTURES): Vit D, 25-Hydroxy: 42.9 ng/mL (ref 30.0–100.0)

## 2023-08-28 ENCOUNTER — Ambulatory Visit (INDEPENDENT_AMBULATORY_CARE_PROVIDER_SITE_OTHER): Payer: Medicare Other | Admitting: "Endocrinology

## 2023-08-28 ENCOUNTER — Other Ambulatory Visit: Payer: Self-pay

## 2023-08-28 ENCOUNTER — Encounter: Payer: Self-pay | Admitting: "Endocrinology

## 2023-08-28 VITALS — BP 132/80 | HR 84 | Ht 66.5 in | Wt 279.6 lb

## 2023-08-28 DIAGNOSIS — E1165 Type 2 diabetes mellitus with hyperglycemia: Secondary | ICD-10-CM | POA: Diagnosis not present

## 2023-08-28 DIAGNOSIS — Z7984 Long term (current) use of oral hypoglycemic drugs: Secondary | ICD-10-CM | POA: Diagnosis not present

## 2023-08-28 DIAGNOSIS — Z794 Long term (current) use of insulin: Secondary | ICD-10-CM | POA: Diagnosis not present

## 2023-08-28 DIAGNOSIS — I10 Essential (primary) hypertension: Secondary | ICD-10-CM | POA: Diagnosis not present

## 2023-08-28 DIAGNOSIS — E782 Mixed hyperlipidemia: Secondary | ICD-10-CM | POA: Diagnosis not present

## 2023-08-28 DIAGNOSIS — Z7985 Long-term (current) use of injectable non-insulin antidiabetic drugs: Secondary | ICD-10-CM | POA: Diagnosis not present

## 2023-08-28 LAB — POCT GLYCOSYLATED HEMOGLOBIN (HGB A1C): HbA1c, POC (controlled diabetic range): 9.8 % — AB (ref 0.0–7.0)

## 2023-08-28 MED ORDER — TRESIBA FLEXTOUCH 200 UNIT/ML ~~LOC~~ SOPN: 20.0000 [IU] | PEN_INJECTOR | Freq: Every day | SUBCUTANEOUS | 2 refills | Status: DC

## 2023-08-28 MED ORDER — OCRELIZUMAB 300 MG/10ML IV SOLN: 300.0000 mg | INTRAVENOUS | 0 refills | Status: DC

## 2023-08-28 MED ORDER — FREESTYLE LIBRE 3 READER DEVI: 1.0000 | Freq: Once | 0 refills | Status: DC | PRN

## 2023-08-28 MED ORDER — BD PEN NEEDLE NANO U/F 32G X 4 MM MISC: 1.0000 | Freq: Four times a day (QID) | 2 refills | Status: AC

## 2023-08-28 MED ORDER — FREESTYLE LIBRE 3 SENSOR MISC: 1.0000 | 2 refills | Status: DC

## 2023-08-28 NOTE — Progress Notes (Signed)
Endocrinology Consult Note       08/28/2023, 2:23 PM   Subjective:    Patient ID: Katrina Montes, female    DOB: [birth year].  Katrina Montes is being seen in consultation for management of currently uncontrolled symptomatic diabetes requested by  Gilmore Laroche, FNP.   Past Medical History:  Diagnosis Date   Anemia    Anxiety    Back pain    Carpal tunnel syndrome    Constipation    Depression    Diabetes mellitus    Diabetes mellitus, type II (HCC)    Hyperlipidemia    Hypertension    Joint pain    Lactose intolerance    Multiple sclerosis (HCC) 2016   Optic neuritis    Shortness of breath    Sleep apnea    Swallowing difficulty    Swelling of both lower extremities    Vision, loss, sudden    Vitamin D deficiency     Past Surgical History:  Procedure Laterality Date   BREAST REDUCTION SURGERY     COLONOSCOPY WITH PROPOFOL N/A 06/02/2022   Procedure: COLONOSCOPY WITH PROPOFOL;  Surgeon: Corbin Ade, MD;  Location: [facility];  Service: Endoscopy;  Laterality: N/A;  8:45AM   CYST EXCISION     INCISION AND DRAINAGE ABSCESS Left 12/23/2021   Procedure: INCISION AND DRAINAGE BUTTOCK ABSCESS;  Surgeon: Almond Lint, MD;  Location: [facility];  Service: General;  Laterality: Left;   ovarian cyst removed     TUBAL LIGATION     WISDOM TOOTH EXTRACTION      Social History   Socioeconomic History   Marital status: Single    Spouse name: Not on file   Number of children: 0   Years of education: College   Highest education level: Bachelor's degree (e.g., BA, AB, BS)  Occupational History   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   Right handed.   Social Determinants of Health   Financial Resource Strain: Medium Risk (03/02/2023)   Overall Financial Resource Strain (CARDIA)    Difficulty of Paying Living Expenses: Somewhat hard  Food Insecurity: Food Insecurity Present (04/24/2023)   Hunger Vital Sign    Worried About Running Out of Food in the Last Year: Sometimes true    Ran Out of Food in the Last Year: Sometimes true  Transportation Needs: No Transportation Needs (04/24/2023)   PRAPARE - Administrator, Civil Service (Medical): No    Lack of Transportation (Non-Medical): No  Physical Activity: Insufficiently Active (04/24/2023)   Exercise Vital Sign    Days of Exercise per Week: 3 days    Minutes of Exercise per Session: 20 min  Stress: Stress Concern Present (04/24/2023)   [employer] of Occupational Health - Occupational Stress  Questionnaire    Feeling of Stress : Very much  Social Connections: Moderately Integrated (04/24/2023)   Social Connection and Isolation Panel [NHANES]    Frequency of Communication with Friends and Family: More than three times a week    Frequency of Social Gatherings with Friends and Family: Once a week    Attends Religious Services: More than 4 times per year    Active Member of [employer] or Organizations: Yes    Attends Engineer, structural: More than 4 times per year    Marital Status: Never married    Family History  Problem Relation Age of Onset   Cancer Mother    Diabetes Mother    Hearing loss Mother    Thyroid nodules Mother    Anemia Mother    Kidney disease Mother    Hypertension Mother    Obesity Mother    Breast cancer Mother    Hyperlipidemia Father    High blood pressure Father    Sleep apnea Father    Pancreatic cancer Father    Anemia Sister    Anemia Sister    Arthritis Maternal Grandmother    Arthritis Maternal Grandfather    Arthritis Paternal Grandmother    Kidney disease Paternal  Grandmother    Hypertension Paternal Grandmother    Arthritis Paternal Grandfather    Vision loss Paternal Grandfather    Stroke Paternal Grandfather    Hyperlipidemia Paternal Grandfather    Pancreatic cancer Maternal Uncle    Kidney disease Paternal Uncle    Colon cancer Neg Hx    Colon polyps Neg Hx     Outpatient Encounter Medications as of 08/28/2023  Medication Sig   insulin degludec (TRESIBA FLEXTOUCH) 200 UNIT/ML FlexTouch Pen Inject 20 Units into the skin at bedtime.   Insulin Pen Needle (BD PEN NEEDLE NANO U/F) 32G X 4 MM MISC 1 each by Does not apply route 4 (four) times daily.   APPLE CIDER VINEGAR PO Take 1 tablet by mouth daily.   atorvastatin (LIPITOR) 40 MG tablet Take 1 tablet (40 mg total) by mouth daily.   celecoxib (CELEBREX) 100 MG capsule Take 1 capsule (100 mg total) by mouth 2 (two) times daily as needed for up to 420 doses.   cephALEXin (KEFLEX) 500 MG capsule Take 1 capsule (500 mg total) by mouth 4 (four) times daily.   cyclobenzaprine (FLEXERIL) 10 MG tablet Take 1 tablet (10 mg total) by mouth 2 (two) times daily as needed for muscle spasms.   diclofenac Sodium (VOLTAREN ARTHRITIS PAIN) 1 % GEL 2 gram qid prn   diphenhydrAMINE (BENADRYL) 25 MG tablet Take 25 mg by mouth every 6 (six) hours as needed for allergies.   docusate sodium (COLACE) 50 MG capsule Take 1 capsule (50 mg total) by mouth 2 (two) times daily. (Patient taking differently: Take 50 mg by mouth daily as needed for moderate constipation.)   Dulaglutide (TRULICITY) 3 MG/0.5ML SOPN Inject 3 mg as directed once a week.   DULoxetine (CYMBALTA) 60 MG capsule Take 1 capsule (60 mg total) by mouth daily.   ELDERBERRY PO Take 1,150 mg by mouth daily.   Erenumab-aooe (AIMOVIG) 70 MG/ML SOAJ Inject 70 mg into the skin every 30 (thirty) days.   ezetimibe (ZETIA) 10 MG tablet Take 1 tablet (10 mg total) by mouth daily.   ferrous sulfate (EQL SLOW RELEASE IRON) 160 (50 Fe) MG TBCR SR tablet Take 1 tablet  (160 mg total) by mouth daily.   gabapentin (NEURONTIN)  300 MG capsule Take 1 capsule (300 mg total) by mouth 3 (three) times daily.   ibuprofen (ADVIL,MOTRIN) 200 MG tablet Take 200 mg by mouth as needed for headache, moderate pain or mild pain.   lisinopril (ZESTRIL) 20 MG tablet TAKE 1 TABLET(20 MG) BY MOUTH DAILY   meclizine (ANTIVERT) 25 MG tablet Take 1 tablet (25 mg total) by mouth 3 (three) times daily as needed for dizziness.   metFORMIN (GLUCOPHAGE) 500 MG tablet Take 1 tablet (500 mg total) by mouth 2 (two) times daily with a meal.   Multiple Vitamin (MULTIVITAMIN) capsule Take 1 capsule by mouth daily.   mupirocin ointment (BACTROBAN) 2 % Apply 1 Application topically 2 (two) times daily.   ocrelizumab (OCREVUS) 300 MG/10ML injection Inject 10 mLs (300 mg total) into the vein every 6 (six) months.   polyethylene glycol powder (GLYCOLAX/MIRALAX) 17 GM/SCOOP powder Take 1 capful (17 g) in water by mouth daily. (Patient taking differently: Take 17 g by mouth daily as needed for moderate constipation.)   polyethylene glycol-electrolytes (NULYTELY) 420 g solution As directed   PRESCRIPTION MEDICATION Place 1 drop into both eyes at bedtime.   rizatriptan (MAXALT) 10 MG tablet Take 1 tablet (10 mg total) by mouth as needed for migraine. May repeat in 2 hours if needed. Max dose: 2/24 hr or 15/30 days.   traZODone (DESYREL) 50 MG tablet Take 2 tablets (100 mg total) by mouth at bedtime.   triamcinolone cream (KENALOG) 0.1 % Apply 1 Application topically 2 (two) times daily.   Vitamin D, Ergocalciferol, (DRISDOL) 1.25 MG (50000 UNIT) CAPS capsule TAKE ONE CAPSULE BY MOUTH EVERY 7 DAYS   No facility-administered encounter medications on file as of 08/28/2023.    ALLERGIES: No Known Allergies  VACCINATION STATUS: Immunization History  Administered Date(s) Administered   Influenza Whole 10/24/2010   Influenza,inj,Quad PF,6+ Mos 09/01/2014, 03/14/2019, 09/17/2019, 12/02/2020, 01/27/2022,  10/11/2022   Moderna Covid-19 Vaccine Bivalent Booster 25yrs & up 07/21/2021   Moderna SARS-COV2 Booster Vaccination 11/16/2020   Moderna Sars-Covid-2 Vaccination 02/09/2020, 03/09/2020   Pneumococcal Polysaccharide-23 08/04/2014   Tdap 09/17/2019    Diabetes She presents for her follow-up diabetic visit. She has type 2 diabetes mellitus. Onset time: She was diagnosed at approximate age of 67 years. Her disease course has been worsening. There are no hypoglycemic associated symptoms. Pertinent negatives for hypoglycemia include no confusion, headaches, pallor or seizures. Associated symptoms include fatigue, polydipsia and polyuria. Pertinent negatives for diabetes include no chest pain and no polyphagia. There are no hypoglycemic complications. Symptoms are worsening (More recently she has shown near target glycemic profile on insulin treatment.  She presents with a meter showing average blood glucose of 123 mg per DL). There are no diabetic complications. Risk factors for coronary artery disease include diabetes mellitus, dyslipidemia, hypertension, obesity and sedentary lifestyle. Current diabetic treatment includes insulin injections (She is currently on Tresiba 20 units nightly, and metformin 500 mg p.o. twice daily.). Her weight is fluctuating minimally. She is following a generally unhealthy diet. When asked about meal planning, she reported none. She has not had a previous visit with a dietitian. She rarely participates in exercise. Her home blood glucose trend is increasing steadily. Her overall blood glucose range is >200 mg/dl. (Ms. Ortmann missed her appointment since December 2023.  She returns with loss of control of glycemia with average blood glucose of 229 for the last 90 days, point-of-care A1c of 9.8% increasing from 7.3% during her last visit.    She denies  hypoglycemia.  She tolerated her Trulicity at 3 mg subcutaneously weekly along with low-dose metformin 500 mg p.o. twice daily.     ) An ACE inhibitor/angiotensin II receptor blocker is being taken.  Hypertension This is a chronic problem. The current episode started more than 1 year ago. Pertinent negatives include no chest pain, headaches, palpitations or shortness of breath. Risk factors for coronary artery disease include dyslipidemia, diabetes mellitus, obesity, sedentary lifestyle and smoking/tobacco exposure. Past treatments include ACE inhibitors.  Hyperlipidemia This is a chronic problem. The current episode started more than 1 year ago. Exacerbating diseases include diabetes and obesity. Pertinent negatives include no chest pain, myalgias or shortness of breath. Risk factors for coronary artery disease include diabetes mellitus, dyslipidemia, family history, hypertension, obesity and a sedentary lifestyle.     Review of Systems  Constitutional:  Positive for fatigue. Negative for chills, fever and unexpected weight change.  HENT:  Negative for trouble swallowing and voice change.   Eyes:  Negative for visual disturbance.  Respiratory:  Negative for cough, shortness of breath and wheezing.   Cardiovascular:  Negative for chest pain, palpitations and leg swelling.  Gastrointestinal:  Negative for diarrhea, nausea and vomiting.  Endocrine: Positive for polydipsia and polyuria. Negative for cold intolerance, heat intolerance and polyphagia.  Musculoskeletal:  Negative for arthralgias and myalgias.  Skin:  Negative for color change, pallor, rash and wound.  Neurological:  Negative for seizures and headaches.  Psychiatric/Behavioral:  Negative for confusion and suicidal ideas.     Objective:       08/28/2023    1:51 PM 07/26/2023    9:54 AM 06/27/2023    7:29 AM  Vitals with BMI  Height 5' 6.5" 5' 6.5" 5\' 7"   Weight 279 lbs 10 oz 289 lbs 1 oz 283 lbs  BMI 44.46 45.97 44.31  Systolic 132 137 295  Diastolic 80 82 89  Pulse 84 83 81    BP 132/80   Pulse 84   Ht 5' 6.5" (1.689 m)   Wt 279 lb 9.6 oz (126.8  kg)   BMI 44.45 kg/m   Wt Readings from Last 3 Encounters:  08/28/23 279 lb 9.6 oz (126.8 kg)  07/26/23 289 lb 1.3 oz (131.1 kg)  06/27/23 283 lb (128.4 kg)      CMP ( most recent) CMP     Component Value Date/Time   NA 137 08/24/2023 1046   K 4.9 08/24/2023 1046   CL 101 08/24/2023 1046   CO2 23 08/24/2023 1046   GLUCOSE 247 (H) 08/24/2023 1046   GLUCOSE 127 (H) 12/26/2021 0212   BUN 14 08/24/2023 1046   CREATININE 0.87 08/24/2023 1046   CREATININE 0.67 11/19/2020 0931   CALCIUM 9.3 08/24/2023 1046   PROT 7.3 08/24/2023 1046   ALBUMIN 4.3 08/24/2023 1046   AST 12 08/24/2023 1046   ALT 14 08/24/2023 1046   ALKPHOS 105 08/24/2023 1046   BILITOT 0.5 08/24/2023 1046   GFRNONAA >60 12/26/2021 0212   GFRAA 120 07/19/2020 0813     Diabetic Labs (most recent): Lab Results  Component Value Date   HGBA1C 9.8 (A) 08/28/2023   HGBA1C 8.7 (H) 04/25/2023   HGBA1C 8.0 (H) 01/24/2023   MICROALBUR 80 07/20/2022   MICROALBUR 1.1 11/19/2020   MICROALBUR 0.9 05/14/2019     Lipid Panel ( most recent) Lipid Panel     Component Value Date/Time   CHOL 207 (H) 08/24/2023 1046   TRIG 60 08/24/2023 1046   HDL 84 08/24/2023  1046   CHOLHDL 2.5 08/24/2023 1046   CHOLHDL 2.5 11/19/2020 0931   VLDL 23 11/02/2014 0944   LDLCALC 112 (H) 08/24/2023 1046   LDLCALC 87 11/19/2020 0931   LABVLDL 11 08/24/2023 1046      Lab Results  Component Value Date   TSH 1.490 08/24/2023   TSH 1.080 04/25/2023   TSH 1.270 01/24/2023   TSH 1.580 09/12/2022   TSH 0.900 01/27/2022   TSH 1.050 10/21/2018   TSH 1.250 02/26/2018   TSH 1.281 07/28/2014   TSH 1.217 01/10/2011   TSH 1.644 09/22/2010   FREET4 1.05 08/24/2023   FREET4 0.80 (L) 04/25/2023   FREET4 0.88 01/24/2023   FREET4 0.94 09/12/2022   FREET4 0.84 01/27/2022       Assessment & Plan:   1. Poorly controlled type 2 diabetes mellitus (HCC)  - Jilliane Judaea Malmquist has currently uncontrolled symptomatic type 2 DM since  52  years of age.  Ms. Masi missed her appointment since December 2023.  She returns with loss of control of glycemia with average blood glucose of 229 for the last 90 days, point-of-care A1c of 9.8% increasing from 7.3% during her last visit.    She denies hypoglycemia.  She tolerated her Trulicity at 3 mg subcutaneously weekly along with low-dose metformin 500 mg p.o. twice daily.     Recent labs reviewed. - I had a long discussion with her about the progressive nature of diabetes and the pathology behind its complications. -her diabetes is complicated by obesity/sedentary life and she remains at a high risk for more acute and chronic complications which include CAD, CVA, CKD, retinopathy, and neuropathy. These are all discussed in detail with her.  - I discussed all available options of managing her diabetes including de-escalation of medications. I have counseled her on diet  and weight management  by adopting a Whole Food , Plant Predominant  ( WFPP) nutrition as recommended by [employer] of Lifestyle Medicine. Patient is encouraged to switch to  unprocessed or minimally processed  complex starch, adequate protein intake (mainly plant source), minimal liquid fat ( mainly vegetable oils), plenty of fruits, and vegetables. -  she is advised to stick to a routine mealtimes to eat 3 complete meals a day and snack only when necessary ( to snack only to correct hypoglycemia BG <70 day time or <100 at night).   - she acknowledges that there is a room for improvement in her food and drink choices. - Suggestion is made for her to avoid simple carbohydrates  from her diet including Cakes, Sweet Desserts, Ice Cream, Soda (diet and regular), Sweet Tea, Candies, Chips, Cookies, Store Bought Juices, Alcohol , Artificial Sweeteners,  Coffee Creamer, and "Sugar-free" Products, Lemonade. This will help patient to have more stable blood glucose profile and potentially avoid unintended weight gain.  The  following Lifestyle Medicine recommendations according to American College of Lifestyle Medicine  [facility]) were discussed and and offered to patient and she  agrees to start the journey:  A. Whole Foods, Plant-Based Nutrition comprising of fruits and vegetables, plant-based proteins, whole-grain carbohydrates was discussed in detail with the patient.   A list for source of those nutrients were also provided to the patient.  Patient will use only water or unsweetened tea for hydration. B.  The need to stay away from risky substances including alcohol, smoking; obtaining 7 to 9 hours of restorative sleep, at least 150 minutes of moderate intensity exercise weekly, the importance of healthy social  connections,  and stress management techniques were discussed. C.  A full color page of  Calorie density of various food groups per pound showing examples of each food groups was provided to the patient.     - she has been  scheduled with Norm Salt, RDN, CDE for individualized diabetes education.  - I have approached her with the following plan to manage  her diabetes and patient agrees:   --In light of her presentation with loss of control of glycemia, she will need at least basal insulin and she agrees.   -I discussed and initiated Tresiba 20 units nightly, associated with monitoring of blood glucose 4 times a day-before meals and at bedtime.   -In the meantime, she is advised to continue Trulicity 3 mg subcutaneously weekly and metformin 500 mg p.o. daily twice daily.    - she is encouraged to call clinic for blood glucose levels less than 70 or above 200 mg /dl. -She will  benefit from a CGM.  I discussed and prescribed a freestyle libre device for her. - Specific targets for  A1c;  LDL, HDL,  and Triglycerides were discussed with the patient.  2) Blood Pressure /Hypertension:     Her blood pressure is controlled to target.  she is advised to continue her current medications including lisinopril 10  mg p.o. daily with breakfast .   3) Lipids/Hyperlipidemia:   Review of her recent lipid panel showed   improved LDL to 85 from 128.  Is mainly due to her whole food plant-based diet transformation.  She is advised to continue atorvastatin 10 mg p.o. nightly.    Side effects and precautions discussed with her.  4)  Weight/Diet:  Body mass index is 44.45 kg/m.  -She was not weighed today due to the heavy boots she is wearing.  Clearly complicating her diabetes care.   she is  a candidate for weight loss. I discussed with her the fact that loss of 5 - 10% of her  current body weight will have the most impact on her diabetes management.  The above detailed  ACLM recommendations for nutrition, exercise, sleep, social life, avoidance of risky substances, the need for restorative sleep   information will also detailed on discharge instructions.  5) vitamin D deficiency: She is advised to continue vitamin D3 5000 units daily.  6) Chronic Care/Health Maintenance:  -she  is on ACEI/ARB and Statin medications and  is encouraged to initiate and continue to follow up with Ophthalmology, Dentist,  Podiatrist at least yearly or according to recommendations, and advised to   stay away from smoking. I have recommended yearly flu vaccine and pneumonia vaccine at least every 5 years; moderate intensity exercise for up to 150 minutes weekly; and  sleep for 7- 9 hours a day.  - she is  advised to maintain close follow up with Gilmore Laroche, FNP for primary care needs, as well as her other providers for optimal and coordinated care.  I spent  26  minutes in the care of the patient today including review of labs from CMP, Lipids, Thyroid Function, Hematology (current and previous including abstractions from other facilities); face-to-face time discussing  her blood glucose readings/logs, discussing hypoglycemia and hyperglycemia episodes and symptoms, medications doses, her options of short and long term treatment based  on the latest standards of care / guidelines;  discussion about incorporating lifestyle medicine;  and documenting the encounter. Risk reduction counseling performed per USPSTF guidelines to reduce  obesity and cardiovascular  risk factors.     Please refer to Patient Instructions for Blood Glucose Monitoring and Insulin/Medications Dosing Guide"  in media tab for additional information. Please  also refer to " Patient Self Inventory" in the Media  tab for reviewed elements of pertinent patient history.  Takeesha Raeanne Barry participated in the discussions, expressed understanding, and voiced agreement with the above plans.  All questions were answered to her satisfaction. she is encouraged to contact clinic should she have any questions or concerns prior to her return visit.    Follow up plan: - Return in about 5 weeks (around 10/02/2023) for F/U with Meter/CGM Megan Salon Only - no Labs.  Marquis Lunch, MD [facility] [address] [place], [place] [address] Phone: [phone]  Fax: [phone]    08/28/2023, 2:23 PM  This note was partially dictated with voice recognition software. Similar sounding words can be transcribed inadequately or may not  be corrected upon review.

## 2023-08-28 NOTE — Patient Instructions (Signed)

## 2023-09-03 ENCOUNTER — Other Ambulatory Visit: Payer: Self-pay

## 2023-09-03 ENCOUNTER — Telehealth: Payer: Self-pay | Admitting: Neurology

## 2023-09-03 ENCOUNTER — Telehealth: Payer: Self-pay

## 2023-09-03 NOTE — Telephone Encounter (Signed)
Yes Ocrelizumb 600mg  every 6 months (not monthly)

## 2023-09-03 NOTE — Progress Notes (Unsigned)
Call back to pharmacy, spoke to [place]. Clarified ocrevus dosing per Dr. Terrace Arabia. Medication list updated

## 2023-09-03 NOTE — Telephone Encounter (Signed)
Sherry @ [facility] is asking for a call as they are needing to double check the dose for pt's Ocrevus order [id number] please call [phone]

## 2023-09-03 NOTE — Telephone Encounter (Signed)
Call to pharmacy and spoke with Judeth Cornfield, she need ing clarification of ocrevus order. Patient has been on the 600 mg ocrevus monthly and not 300 mg and needing to clarify dose. Advised I will send for to Dr. Terrace Arabia for clarification.

## 2023-09-04 ENCOUNTER — Other Ambulatory Visit (INDEPENDENT_AMBULATORY_CARE_PROVIDER_SITE_OTHER): Payer: Self-pay

## 2023-09-04 DIAGNOSIS — Z0289 Encounter for other administrative examinations: Secondary | ICD-10-CM

## 2023-09-04 DIAGNOSIS — G35 Multiple sclerosis: Secondary | ICD-10-CM | POA: Diagnosis not present

## 2023-09-04 MED ORDER — SODIUM CHLORIDE 0.9 % IV SOLN: 600.0000 mg | Freq: Once | INTRAVENOUS | 3 refills | Status: AC

## 2023-09-04 NOTE — Progress Notes (Signed)
Meds ordered this encounter  Medications   ocrelizumab 600 mg in sodium chloride 0.9 % 500 mL    Sig: Inject 600 mg into the vein once for 1 dose.    Dispense:  1 each    Refill:  3

## 2023-09-13 DIAGNOSIS — G35 Multiple sclerosis: Secondary | ICD-10-CM | POA: Diagnosis not present

## 2023-09-18 HISTORY — PX: MOUTH SURGERY: SHX715

## 2023-10-02 ENCOUNTER — Ambulatory Visit: Payer: Medicare Other | Admitting: "Endocrinology

## 2023-10-02 ENCOUNTER — Encounter: Payer: Self-pay | Admitting: "Endocrinology

## 2023-10-02 VITALS — BP 138/88 | HR 88 | Ht 66.5 in | Wt 285.4 lb

## 2023-10-02 DIAGNOSIS — E782 Mixed hyperlipidemia: Secondary | ICD-10-CM

## 2023-10-02 DIAGNOSIS — I10 Essential (primary) hypertension: Secondary | ICD-10-CM

## 2023-10-02 DIAGNOSIS — E1165 Type 2 diabetes mellitus with hyperglycemia: Secondary | ICD-10-CM | POA: Diagnosis not present

## 2023-10-02 DIAGNOSIS — E559 Vitamin D deficiency, unspecified: Secondary | ICD-10-CM

## 2023-10-02 NOTE — Patient Instructions (Signed)
                                     Advice for Weight Management  -For most of us the best way to lose weight is by diet management. Generally speaking, diet management means consuming less calories intentionally which over time brings about progressive weight loss.  This can be achieved more effectively by avoiding ultra processed carbohydrates, processed meats, unhealthy fats.    It is critically important to know your numbers: how much calorie you are consuming and how much calorie you need. More importantly, our carbohydrates sources should be unprocessed naturally occurring  complex starch food items.  It is always important to balance nutrition also by  appropriate intake of proteins (mainly plant-based), healthy fats/oils, plenty of fruits and vegetables.   -The American College of Lifestyle Medicine (ACL M) recommends nutrition derived mostly from Whole Food, Plant Predominant Sources example an apple instead of applesauce or apple pie. Eat Plenty of vegetables, Mushrooms, fruits, Legumes, Whole Grains, Nuts, seeds in lieu of processed meats, processed snacks/pastries red meat, poultry, eggs.  Use only water or unsweetened tea for hydration.  The College also recommends the need to stay away from risky substances including alcohol, smoking; obtaining 7-9 hours of restorative sleep, at least 150 minutes of moderate intensity exercise weekly, importance of healthy social connections, and being mindful of stress and seek help when it is overwhelming.    -Sticking to a routine mealtime to eat 3 meals a day and avoiding unnecessary snacks is shown to have a big role in weight control. Under normal circumstances, the only time we burn stored energy is when we are hungry, so allow  some hunger to take place- hunger means no food between appropriate meal times, only water.  It is not advisable to starve.   -It is better to avoid simple carbohydrates including:  Cakes, Sweet Desserts, Ice Cream, Soda (diet and regular), Sweet Tea, Candies, Chips, Cookies, Store Bought Juices, Alcohol in Excess of  1-2 drinks a day, Lemonade,  Artificial Sweeteners, Doughnuts, Coffee Creamers, "Sugar-free" Products, etc, etc.  This is not a complete list.....    -Consulting with certified diabetes educators is proven to provide you with the most accurate and current information on diet.  Also, you may be  interested in discussing diet options/exchanges , we can schedule a visit with Katrina Montes, RDN, CDE for individualized nutrition education.  -Exercise: If you are able: 30 -60 minutes a day ,4 days a week, or 150 minutes of moderate intensity exercise weekly.    The longer the better if tolerated.  Combine stretch, strength, and aerobic activities.  If you were told in the past that you have high risk for cardiovascular diseases, or if you are currently symptomatic, you may seek evaluation by your heart doctor prior to initiating moderate to intense exercise programs.                                  Additional Care Considerations for Diabetes/Prediabetes   -Diabetes  is a chronic disease.  The most important care consideration is regular follow-up with your diabetes care provider with the goal being avoiding or delaying its complications and to take advantage of advances in medications and technology.  If appropriate actions are taken early enough, type 2 diabetes can even be   reversed.  Seek information from the right source.  - Whole Food, Plant Predominant Nutrition is highly recommended: Eat Plenty of vegetables, Mushrooms, fruits, Legumes, Whole Grains, Nuts, seeds in lieu of processed meats, processed snacks/pastries red meat, poultry, eggs as recommended by American College of  Lifestyle Medicine (ACLM).  -Type 2 diabetes is known to coexist with other important comorbidities such as high blood pressure and high cholesterol.  It is critical to control not only the  diabetes but also the high blood pressure and high cholesterol to minimize and delay the risk of complications including coronary artery disease, stroke, amputations, blindness, etc.  The good news is that this diet recommendation for type 2 diabetes is also very helpful for managing high cholesterol and high blood blood pressure.  - Studies showed that people with diabetes will benefit from a class of medications known as ACE inhibitors and statins.  Unless there are specific reasons not to be on these medications, the standard of care is to consider getting one from these groups of medications at an optimal doses.  These medications are generally considered safe and proven to help protect the heart and the kidneys.    - People with diabetes are encouraged to initiate and maintain regular follow-up with eye doctors, foot doctors, dentists , and if necessary heart and kidney doctors.     - It is highly recommended that people with diabetes quit smoking or stay away from smoking, and get yearly  flu vaccine and pneumonia vaccine at least every 5 years.  See above for additional recommendations on exercise, sleep, stress management , and healthy social connections.      

## 2023-10-02 NOTE — Progress Notes (Signed)
10/02/2023, 11:42 AM   Endocrinology follow-up note  Subjective:    Patient ID: Katrina Montes, female    DOB: March 08, 1971.  Katrina Montes is being seen in consultation for management of currently uncontrolled symptomatic diabetes requested by  Gilmore Laroche, FNP.   Past Medical History:  Diagnosis Date   Anemia    Anxiety    Back pain    Carpal tunnel syndrome    Constipation    Depression    Diabetes mellitus    Diabetes mellitus, type II (HCC)    Hyperlipidemia    Hypertension    Joint pain    Lactose intolerance    Multiple sclerosis (HCC) 2016   Optic neuritis    Shortness of breath    Sleep apnea    Swallowing difficulty    Swelling of both lower extremities    Vision, loss, sudden    Vitamin D deficiency     Past Surgical History:  Procedure Laterality Date   BREAST REDUCTION SURGERY     COLONOSCOPY WITH PROPOFOL N/A 06/02/2022   Procedure: COLONOSCOPY WITH PROPOFOL;  Surgeon: Corbin Ade, MD;  Location: [facility];  Service: Endoscopy;  Laterality: N/A;  8:45AM   CYST EXCISION     INCISION AND DRAINAGE ABSCESS Left 12/23/2021   Procedure: INCISION AND DRAINAGE BUTTOCK ABSCESS;  Surgeon: Almond Lint, MD;  Location: [facility] OR;  Service: General;  Laterality: Left;   MOUTH SURGERY  09/2023   ovarian cyst removed     TUBAL LIGATION     WISDOM TOOTH EXTRACTION      Social History   Socioeconomic History   Marital status: Single    Spouse name: Not on file   Number of children: 0   Years of education: College   Highest education level: Bachelor's degree (e.g., BA, AB, BS)  Occupational History   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   Right handed.   Social Determinants of Health   Financial Resource Strain: Medium Risk (03/02/2023)   Overall Financial Resource Strain (CARDIA)    Difficulty of Paying Living Expenses: Somewhat hard  Food Insecurity: Food Insecurity Present (04/24/2023)   Hunger Vital Sign    Worried About Running Out of Food in the Last Year: Sometimes true    Ran Out of Food in the Last Year: Sometimes true  Transportation Needs: No Transportation Needs (04/24/2023)   PRAPARE - Administrator, Civil Service (Medical): No    Lack of Transportation (Non-Medical): No  Physical Activity: Insufficiently Active (04/24/2023)   Exercise Vital Sign    Days of Exercise per Week: 3 days    Minutes of Exercise per Session: 20 min  Stress: Stress Concern Present (04/24/2023)   [employer] of Occupational  Health - Occupational Stress Questionnaire    Feeling of Stress : Very much  Social Connections: Moderately Integrated (04/24/2023)   Social Connection and Isolation Panel [NHANES]    Frequency of Communication with Friends and Family: More than three times a week    Frequency of Social Gatherings with Friends and Family: Once a week    Attends Religious Services: More than 4 times per year    Active Member of [employer] or Organizations: Yes    Attends Engineer, structural: More than 4 times per year    Marital Status: Never married    Family History  Problem Relation Age of Onset   Cancer Mother    Diabetes Mother    Hearing loss Mother    Thyroid nodules Mother    Anemia Mother    Kidney disease Mother    Hypertension Mother    Obesity Mother    Breast cancer Mother    Hyperlipidemia Father    High blood pressure Father    Sleep apnea Father    Pancreatic cancer Father    Anemia Sister    Anemia Sister    Arthritis Maternal Grandmother    Arthritis Maternal Grandfather    Arthritis Paternal  Grandmother    Kidney disease Paternal Grandmother    Hypertension Paternal Grandmother    Arthritis Paternal Grandfather    Vision loss Paternal Grandfather    Stroke Paternal Grandfather    Hyperlipidemia Paternal Grandfather    Pancreatic cancer Maternal Uncle    Kidney disease Paternal Uncle    Colon cancer Neg Hx    Colon polyps Neg Hx     Outpatient Encounter Medications as of 10/02/2023  Medication Sig   acetaminophen-codeine (TYLENOL #3) 300-30 MG tablet Take 1 tablet by mouth every 6 (six) hours as needed.   amoxicillin (AMOXIL) 500 MG capsule Take 500 mg by mouth every 8 (eight) hours.   APPLE CIDER VINEGAR PO Take 1 tablet by mouth daily.   atorvastatin (LIPITOR) 40 MG tablet Take 1 tablet (40 mg total) by mouth daily.   celecoxib (CELEBREX) 100 MG capsule Take 1 capsule (100 mg total) by mouth 2 (two) times daily as needed for up to 420 doses.   cephALEXin (KEFLEX) 500 MG capsule Take 1 capsule (500 mg total) by mouth 4 (four) times daily.   Continuous Glucose Receiver (FREESTYLE LIBRE 3 READER) DEVI 1 Piece by Does not apply route once as needed for up to 1 dose.   Continuous Glucose Sensor (FREESTYLE LIBRE 3 SENSOR) MISC 1 Piece by Does not apply route every 14 (fourteen) days. Place 1 sensor on the skin every 14 days. Use to check glucose continuously   cyclobenzaprine (FLEXERIL) 10 MG tablet Take 1 tablet (10 mg total) by mouth 2 (two) times daily as needed for muscle spasms.   diclofenac Sodium (VOLTAREN ARTHRITIS PAIN) 1 % GEL 2 gram qid prn   diphenhydrAMINE (BENADRYL) 25 MG tablet Take 25 mg by mouth every 6 (six) hours as needed for allergies.   docusate sodium (COLACE) 50 MG capsule Take 1 capsule (50 mg total) by mouth 2 (two) times daily. (Patient taking differently: Take 50 mg by mouth daily as needed for moderate constipation.)   DULoxetine (CYMBALTA) 60 MG capsule Take 1 capsule (60 mg total) by mouth daily.   ELDERBERRY PO Take 1,150 mg by mouth daily.    Erenumab-aooe (AIMOVIG) 70 MG/ML SOAJ Inject 70 mg into the skin every 30 (thirty) days.   ezetimibe (ZETIA)  10 MG tablet Take 1 tablet (10 mg total) by mouth daily.   ferrous sulfate (EQL SLOW RELEASE IRON) 160 (50 Fe) MG TBCR SR tablet Take 1 tablet (160 mg total) by mouth daily.   gabapentin (NEURONTIN) 300 MG capsule Take 1 capsule (300 mg total) by mouth 3 (three) times daily.   ibuprofen (ADVIL,MOTRIN) 200 MG tablet Take 200 mg by mouth as needed for headache, moderate pain or mild pain.   insulin degludec (TRESIBA FLEXTOUCH) 200 UNIT/ML FlexTouch Pen Inject 20 Units into the skin at bedtime.   Insulin Pen Needle (BD PEN NEEDLE NANO U/F) 32G X 4 MM MISC 1 each by Does not apply route 4 (four) times daily.   lisinopril (ZESTRIL) 20 MG tablet TAKE 1 TABLET(20 MG) BY MOUTH DAILY   meclizine (ANTIVERT) 25 MG tablet Take 1 tablet (25 mg total) by mouth 3 (three) times daily as needed for dizziness.   metFORMIN (GLUCOPHAGE) 500 MG tablet Take 1 tablet (500 mg total) by mouth 2 (two) times daily with a meal.   Multiple Vitamin (MULTIVITAMIN) capsule Take 1 capsule by mouth daily.   mupirocin ointment (BACTROBAN) 2 % Apply 1 Application topically 2 (two) times daily.   polyethylene glycol powder (GLYCOLAX/MIRALAX) 17 GM/SCOOP powder Take 1 capful (17 g) in water by mouth daily. (Patient taking differently: Take 17 g by mouth daily as needed for moderate constipation.)   polyethylene glycol-electrolytes (NULYTELY) 420 g solution As directed   PRESCRIPTION MEDICATION Place 1 drop into both eyes at bedtime.   rizatriptan (MAXALT) 10 MG tablet Take 1 tablet (10 mg total) by mouth as needed for migraine. May repeat in 2 hours if needed. Max dose: 2/24 hr or 15/30 days.   traZODone (DESYREL) 50 MG tablet Take 2 tablets (100 mg total) by mouth at bedtime.   triamcinolone cream (KENALOG) 0.1 % Apply 1 Application topically 2 (two) times daily.   Vitamin D, Ergocalciferol, (DRISDOL) 1.25 MG (50000 UNIT) CAPS  capsule TAKE ONE CAPSULE BY MOUTH EVERY 7 DAYS   [DISCONTINUED] Dulaglutide (TRULICITY) 3 MG/0.5ML SOPN Inject 3 mg as directed once a week.   No facility-administered encounter medications on file as of 10/02/2023.    ALLERGIES: No Known Allergies  VACCINATION STATUS: Immunization History  Administered Date(s) Administered   Influenza Whole 10/24/2010   Influenza,inj,Quad PF,6+ Mos 09/01/2014, 03/14/2019, 09/17/2019, 12/02/2020, 01/27/2022, 10/11/2022   Moderna Covid-19 Vaccine Bivalent Booster 22yrs & up 07/21/2021   Moderna SARS-COV2 Booster Vaccination 11/16/2020   Moderna Sars-Covid-2 Vaccination 02/09/2020, 03/09/2020   Pneumococcal Polysaccharide-23 08/04/2014   Tdap 09/17/2019    Diabetes She presents for her follow-up diabetic visit. She has type 2 diabetes mellitus. Onset time: She was diagnosed at approximate age of 58 years. Her disease course has been improving. There are no hypoglycemic associated symptoms. Pertinent negatives for hypoglycemia include no confusion, headaches, pallor or seizures. Pertinent negatives for diabetes include no chest pain, no fatigue, no polydipsia, no polyphagia and no polyuria. There are no hypoglycemic complications. Symptoms are improving (More recently she has shown near target glycemic profile on insulin treatment.  She presents with a meter showing average blood glucose of 123 mg per DL). There are no diabetic complications. Risk factors for coronary artery disease include diabetes mellitus, dyslipidemia, hypertension, obesity and sedentary lifestyle. Current diabetic treatment includes insulin injections. Her weight is increasing steadily. She is following a generally unhealthy diet. When asked about meal planning, she reported none. She has not had a previous visit with a dietitian. She  rarely participates in exercise. Her home blood glucose trend is decreasing steadily. Her breakfast blood glucose range is generally 140-180 mg/dl. Her lunch  blood glucose range is generally 140-180 mg/dl. Her dinner blood glucose range is generally 140-180 mg/dl. Her bedtime blood glucose range is generally 140-180 mg/dl. Her overall blood glucose range is 140-180 mg/dl. (Katrina Montes presents with significant improvement in her glycemic profile averaging 150 mg/dl.  This is despite her recent follow-up with A1c of 9.8%.  Her CGM shows 72% time range, 24% in 1 hyperglycemia, 2% able to hyperglycemia.  She did not document significant hypoglycemia .   ) An ACE inhibitor/angiotensin II receptor blocker is being taken.  Hypertension This is a chronic problem. The current episode started more than 1 year ago. Pertinent negatives include no chest pain, headaches, palpitations or shortness of breath. Risk factors for coronary artery disease include dyslipidemia, diabetes mellitus, obesity, sedentary lifestyle and smoking/tobacco exposure. Past treatments include ACE inhibitors.  Hyperlipidemia This is a chronic problem. The current episode started more than 1 year ago. Exacerbating diseases include diabetes and obesity. Pertinent negatives include no chest pain, myalgias or shortness of breath. Risk factors for coronary artery disease include diabetes mellitus, dyslipidemia, family history, hypertension, obesity and a sedentary lifestyle.    Review of Systems  Constitutional:  Negative for chills, fatigue, fever and unexpected weight change.  HENT:  Negative for trouble swallowing and voice change.   Eyes:  Negative for visual disturbance.  Respiratory:  Negative for cough, shortness of breath and wheezing.   Cardiovascular:  Negative for chest pain, palpitations and leg swelling.  Gastrointestinal:  Negative for diarrhea, nausea and vomiting.  Endocrine: Negative for cold intolerance, heat intolerance, polydipsia, polyphagia and polyuria.  Musculoskeletal:  Negative for arthralgias and myalgias.  Skin:  Negative for color change, pallor, rash and wound.   Neurological:  Negative for seizures and headaches.  Psychiatric/Behavioral:  Negative for confusion and suicidal ideas.     Objective:       10/02/2023    8:55 AM 08/28/2023    1:51 PM 07/26/2023    9:54 AM  Vitals with BMI  Height 5' 6.5" 5' 6.5" 5' 6.5"  Weight 285 lbs 6 oz 279 lbs 10 oz 289 lbs 1 oz  BMI 45.38 44.46 45.97  Systolic 138 132 147  Diastolic 88 80 82  Pulse 88 84 83    BP 138/88   Pulse 88   Ht 5' 6.5" (1.689 m)   Wt 285 lb 6.4 oz (129.5 kg)   BMI 45.37 kg/m   Wt Readings from Last 3 Encounters:  10/02/23 285 lb 6.4 oz (129.5 kg)  08/28/23 279 lb 9.6 oz (126.8 kg)  07/26/23 289 lb 1.3 oz (131.1 kg)      CMP ( most recent) CMP     Component Value Date/Time   NA 137 08/24/2023 1046   K 4.9 08/24/2023 1046   CL 101 08/24/2023 1046   CO2 23 08/24/2023 1046   GLUCOSE 247 (H) 08/24/2023 1046   GLUCOSE 127 (H) 12/26/2021 0212   BUN 14 08/24/2023 1046   CREATININE 0.87 08/24/2023 1046   CREATININE 0.67 11/19/2020 0931   CALCIUM 9.3 08/24/2023 1046   PROT 7.3 08/24/2023 1046   ALBUMIN 4.3 08/24/2023 1046   AST 12 08/24/2023 1046   ALT 14 08/24/2023 1046   ALKPHOS 105 08/24/2023 1046   BILITOT 0.5 08/24/2023 1046   GFRNONAA >60 12/26/2021 0212   GFRAA 120 07/19/2020 0813  Diabetic Labs (most recent): Lab Results  Component Value Date   HGBA1C 9.8 (A) 08/28/2023   HGBA1C 8.7 (H) 04/25/2023   HGBA1C 8.0 (H) 01/24/2023   MICROALBUR 80 07/20/2022   MICROALBUR 1.1 11/19/2020   MICROALBUR 0.9 05/14/2019     Lipid Panel ( most recent) Lipid Panel     Component Value Date/Time   CHOL 207 (H) 08/24/2023 1046   TRIG 60 08/24/2023 1046   HDL 84 08/24/2023 1046   CHOLHDL 2.5 08/24/2023 1046   CHOLHDL 2.5 11/19/2020 0931   VLDL 23 11/02/2014 0944   LDLCALC 112 (H) 08/24/2023 1046   LDLCALC 87 11/19/2020 0931   LABVLDL 11 08/24/2023 1046      Lab Results  Component Value Date   TSH 1.490 08/24/2023   TSH 1.080 04/25/2023   TSH 1.270  01/24/2023   TSH 1.580 09/12/2022   TSH 0.900 01/27/2022   TSH 1.050 10/21/2018   TSH 1.250 02/26/2018   TSH 1.281 07/28/2014   TSH 1.217 01/10/2011   TSH 1.644 09/22/2010   FREET4 1.05 08/24/2023   FREET4 0.80 (L) 04/25/2023   FREET4 0.88 01/24/2023   FREET4 0.94 09/12/2022   FREET4 0.84 01/27/2022       Assessment & Plan:   1. Poorly controlled type 2 diabetes mellitus (HCC)  - Katrina Montes has currently uncontrolled symptomatic type 2 DM since  52 years of age.  Katrina Montes presents with significant improvement in her glycemic profile averaging 150 mg/dl.  This is despite her recent follow-up with A1c of 9.8%.  Her CGM shows 72% time range, 24% in 1 hyperglycemia, 2% able to hyperglycemia.  She did not document significant hypoglycemia .   Recent labs reviewed. - I had a long discussion with her about the progressive nature of diabetes and the pathology behind its complications. -her diabetes is complicated by obesity/sedentary life and she remains at a high risk for more acute and chronic complications which include CAD, CVA, CKD, retinopathy, and neuropathy. These are all discussed in detail with her.  - I discussed all available options of managing her diabetes including de-escalation of medications. I have counseled her on diet  and weight management  by adopting a Whole Food , Plant Predominant  ( WFPP) nutrition as recommended by [employer] of Lifestyle Medicine. Patient is encouraged to switch to  unprocessed or minimally processed  complex starch, adequate protein intake (mainly plant source), minimal liquid fat ( mainly vegetable oils), plenty of fruits, and vegetables. -  she is advised to stick to a routine mealtimes to eat 3 complete meals a day and snack only when necessary ( to snack only to correct hypoglycemia BG <70 day time or <100 at night).   - she acknowledges that there is a room for improvement in her food and drink choices. - Suggestion is  made for her to avoid simple carbohydrates  from her diet including Cakes, Sweet Desserts, Ice Cream, Soda (diet and regular), Sweet Tea, Candies, Chips, Cookies, Store Bought Juices, Alcohol , Artificial Sweeteners,  Coffee Creamer, and "Sugar-free" Products, Lemonade. This will help patient to have more stable blood glucose profile and potentially avoid unintended weight gain.  The following Lifestyle Medicine recommendations according to American College of Lifestyle Medicine  [facility]) were discussed and and offered to patient and she  agrees to start the journey:  A. Whole Foods, Plant-Based Nutrition comprising of fruits and vegetables, plant-based proteins, whole-grain carbohydrates was discussed in detail with the patient.  A list for source of those nutrients were also provided to the patient.  Patient will use only water or unsweetened tea for hydration. B.  The need to stay away from risky substances including alcohol, smoking; obtaining 7 to 9 hours of restorative sleep, at least 150 minutes of moderate intensity exercise weekly, the importance of healthy social connections,  and stress management techniques were discussed. C.  A full color page of  Calorie density of various food groups per pound showing examples of each food groups was provided to the patient.    - she has been  scheduled with Norm Salt, RDN, CDE for individualized diabetes education.  - I have approached her with the following plan to manage  her diabetes and patient agrees:   -She will continue to need insulin treatment in order for her to maintain control of diabetes to target.  She is advised to continue Tresiba 20 units nightly, continue metformin 500 g p.o. twice daily.    -After she finishes her current supply of Trulicity, 3 mg subcutaneously weekly, she will switch to [facility] starting from 2.5 mg subcutaneously weekly and advance as tolerated. -She is encouraged to continue to use her CGM.  - she is  encouraged to call clinic for blood glucose levels less than 70 or above 200 mg /dl.  - Specific targets for  A1c;  LDL, HDL,  and Triglycerides were discussed with the patient.  2) Blood Pressure /Hypertension:    Her blood pressure is controlled to target.  she is advised to continue her current medications including lisinopril 10 mg p.o. daily with breakfast .   3) Lipids/Hyperlipidemia:   Review of her recent lipid panel showed   improved LDL to 85 from 128.  Is mainly due to her whole food plant-based diet transformation.  She is advised to continue atorvastatin 10 mg p.o. nightly.      Side effects and precautions discussed with her.  4)  Weight/Diet:  Body mass index is 45.37 kg/m.  -She is gaining weight.  Her higher BMI is clearly complicating her diabetes care.   she is  a candidate for weight loss. I discussed with her the fact that loss of 5 - 10% of her  current body weight will have the most impact on her diabetes management.  The above detailed  ACLM recommendations for nutrition, exercise, sleep, social life, avoidance of risky substances, the need for restorative sleep   information will also detailed on discharge instructions.  5) vitamin D deficiency: She is advised to continue vitamin D3 5000 units daily.  6) Chronic Care/Health Maintenance:  -she  is on ACEI/ARB and Statin medications and  is encouraged to initiate and continue to follow up with Ophthalmology, Dentist,  Podiatrist at least yearly or according to recommendations, and advised to   stay away from smoking. I have recommended yearly flu vaccine and pneumonia vaccine at least every 5 years; moderate intensity exercise for up to 150 minutes weekly; and  sleep for 7- 9 hours a day.  - she is  advised to maintain close follow up with Gilmore Laroche, FNP for primary care needs, as well as her other providers for optimal and coordinated care.    I spent  40  minutes in the care of the patient today including review  of labs from CMP, Lipids, Thyroid Function, Hematology (current and previous including abstractions from other facilities); face-to-face time discussing  her blood glucose readings/logs, discussing hypoglycemia and hyperglycemia episodes and symptoms,  medications doses, her options of short and long term treatment based on the latest standards of care / guidelines;  discussion about incorporating lifestyle medicine;  and documenting the encounter. Risk reduction counseling performed per USPSTF guidelines to reduce  obesity and cardiovascular risk factors.     Please refer to Patient Instructions for Blood Glucose Monitoring and Insulin/Medications Dosing Guide"  in media tab for additional information. Please  also refer to " Patient Self Inventory" in the Media  tab for reviewed elements of pertinent patient history.  Katrina Montes participated in the discussions, expressed understanding, and voiced agreement with the above plans.  All questions were answered to her satisfaction. she is encouraged to contact clinic should she have any questions or concerns prior to her return visit.     Follow up plan: - Return in about 4 months (around 02/02/2024) for F/U with Pre-visit Labs, Meter/CGM/Logs, A1c here.  Marquis Lunch, MD [facility] [address] [place], [place] [address] Phone: [phone]  Fax: [phone]    10/02/2023, 11:42 AM  This note was partially dictated with voice recognition software. Similar sounding words can be transcribed inadequately or may not  be corrected upon review.

## 2023-10-07 ENCOUNTER — Other Ambulatory Visit: Payer: Self-pay | Admitting: Family Medicine

## 2023-10-07 DIAGNOSIS — E1165 Type 2 diabetes mellitus with hyperglycemia: Secondary | ICD-10-CM

## 2023-11-27 ENCOUNTER — Other Ambulatory Visit: Payer: Self-pay

## 2023-11-27 ENCOUNTER — Ambulatory Visit (INDEPENDENT_AMBULATORY_CARE_PROVIDER_SITE_OTHER): Payer: Medicare Other

## 2023-11-27 ENCOUNTER — Ambulatory Visit (INDEPENDENT_AMBULATORY_CARE_PROVIDER_SITE_OTHER): Payer: Medicare Other | Admitting: Family Medicine

## 2023-11-27 VITALS — BP 138/86 | HR 98 | Ht 66.5 in | Wt 289.0 lb

## 2023-11-27 DIAGNOSIS — E559 Vitamin D deficiency, unspecified: Secondary | ICD-10-CM

## 2023-11-27 DIAGNOSIS — E038 Other specified hypothyroidism: Secondary | ICD-10-CM | POA: Diagnosis not present

## 2023-11-27 DIAGNOSIS — Z23 Encounter for immunization: Secondary | ICD-10-CM

## 2023-11-27 DIAGNOSIS — E782 Mixed hyperlipidemia: Secondary | ICD-10-CM

## 2023-11-27 DIAGNOSIS — I10 Essential (primary) hypertension: Secondary | ICD-10-CM | POA: Diagnosis not present

## 2023-11-27 DIAGNOSIS — Z7984 Long term (current) use of oral hypoglycemic drugs: Secondary | ICD-10-CM | POA: Diagnosis not present

## 2023-11-27 DIAGNOSIS — E7849 Other hyperlipidemia: Secondary | ICD-10-CM | POA: Diagnosis not present

## 2023-11-27 DIAGNOSIS — E1165 Type 2 diabetes mellitus with hyperglycemia: Secondary | ICD-10-CM

## 2023-11-27 MED ORDER — FREESTYLE LIBRE 3 READER DEVI: 0 refills | Status: DC

## 2023-11-27 NOTE — Assessment & Plan Note (Signed)
Controlled She denies headaches, dizziness, blurred vision, and chest pain She is stable on lisinopril 20 mg daily Encouraged low sodium diet with increased physical activity BP Readings from Last 3 Encounters:  11/27/23 138/86  10/02/23 138/88  08/28/23 132/80

## 2023-11-27 NOTE — Assessment & Plan Note (Signed)
LDL is not at goal of less than 70 She has been compliant with Lipitor 40 mg daily and Zetia 10 mg daily. We will likely increase Lipitor to 80 mg daily if her cholesterol is not at goal. She is encouraged to decrease her intake of greasy, fatty, and starchy foods and increase physical activity for better cholesterol control. The patient verbalized understanding and is aware of the plan of care. Lab Results  Component Value Date   CHOL 207 (H) 08/24/2023   HDL 84 08/24/2023   LDLCALC 112 (H) 08/24/2023   TRIG 60 08/24/2023   CHOLHDL 2.5 08/24/2023

## 2023-11-27 NOTE — Assessment & Plan Note (Signed)
The patient is currently under the care of endocrinology. She reports taking Trulicity 3 mg weekly, metformin 500 mg twice daily, and is compliant with her bedtime insulin, Degludec Evaristo Bury) 20 units. She denies polyuria, polyphagia, and polydipsia. She has an appointment with her endocrinologist in a few weeks. We will obtain a hemoglobin A1c today and inform her provider of the results. The patient has decreased her intake of high-sugar foods and beverages and has increased her physical activity. The patient verbalized understanding and is aware of the plan of care. Lab Results  Component Value Date   HGBA1C 9.8 (A) 08/28/2023

## 2023-11-27 NOTE — Progress Notes (Signed)
Established Patient Office Visit  Subjective:  Patient ID: Katrina Montes, female    DOB: 07-01-1971  Age: 52 y.o. MRN: [id number]  CC:  Chief Complaint  Patient presents with   Care Management    4 month f/u    HPI Katrina Montes is a 52 y.o. female with past medical history of hypertension, type 2 diabetes, hyperlipidemia presents for f/u of  chronic medical conditions. For the details of today's visit, please refer to the assessment and plan.     Past Medical History:  Diagnosis Date   Anemia    Anxiety    Back pain    Carpal tunnel syndrome    Constipation    Depression    Diabetes mellitus    Diabetes mellitus, type II (HCC)    Hyperlipidemia    Hypertension    Joint pain    Lactose intolerance    Multiple sclerosis (HCC) 2016   Optic neuritis    Shortness of breath    Sleep apnea    Swallowing difficulty    Swelling of both lower extremities    Vision, loss, sudden    Vitamin D deficiency     Past Surgical History:  Procedure Laterality Date   BREAST REDUCTION SURGERY     COLONOSCOPY WITH PROPOFOL N/A 06/02/2022   Procedure: COLONOSCOPY WITH PROPOFOL;  Surgeon: Corbin Ade, MD;  Location: [facility];  Service: Endoscopy;  Laterality: N/A;  8:45AM   CYST EXCISION     INCISION AND DRAINAGE ABSCESS Left 12/23/2021   Procedure: INCISION AND DRAINAGE BUTTOCK ABSCESS;  Surgeon: Almond Lint, MD;  Location: [facility] OR;  Service: General;  Laterality: Left;   MOUTH SURGERY  09/2023   ovarian cyst removed     TUBAL LIGATION     WISDOM TOOTH EXTRACTION      Family History  Problem Relation Age of Onset   Cancer Mother    Diabetes Mother    Hearing loss Mother    Thyroid nodules Mother    Anemia Mother    Kidney disease Mother    Hypertension Mother    Obesity Mother    Breast cancer Mother    Hyperlipidemia Father    High blood pressure Father    Sleep apnea Father    Pancreatic cancer Father    Anemia Sister    Anemia Sister     Arthritis Maternal Grandmother    Arthritis Maternal Grandfather    Arthritis Paternal Grandmother    Kidney disease Paternal Grandmother    Hypertension Paternal Grandmother    Arthritis Paternal Grandfather    Vision loss Paternal Grandfather    Stroke Paternal Grandfather    Hyperlipidemia Paternal Grandfather    Pancreatic cancer Maternal Uncle    Kidney disease Paternal Uncle    Colon cancer Neg Hx    Colon polyps Neg Hx     Social History   Socioeconomic History   Marital status: Single    Spouse name: Not on file   Number of children: 0   Years of education: College   Highest education level: Bachelor's degree (e.g., BA, AB, BS)  Occupational History   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   Right handed.   Social Determinants of Health   Financial Resource Strain: Medium Risk (11/27/2023)   Overall Financial Resource Strain (CARDIA)    Difficulty of Paying Living Expenses: Somewhat hard  Food Insecurity: Food Insecurity Present (11/27/2023)   Hunger Vital Sign    Worried About Running Out of Food in the Last Year: Sometimes true    Ran Out of Food in the Last Year: Sometimes true  Transportation Needs: No Transportation Needs (11/27/2023)   PRAPARE - Administrator, Civil Service (Medical): No    Lack of Transportation (Non-Medical): No  Physical Activity: Insufficiently Active (11/27/2023)   Exercise Vital Sign    Days of Exercise per Week: 3 days    Minutes of Exercise per Session: 30 min  Stress: Stress Concern Present (11/27/2023)   [employer] of Occupational Health - Occupational Stress Questionnaire    Feeling of Stress : To some extent  Social  Connections: Moderately Integrated (11/27/2023)   Social Connection and Isolation Panel [NHANES]    Frequency of Communication with Friends and Family: More than three times a week    Frequency of Social Gatherings with Friends and Family: Once a week    Attends Religious Services: More than 4 times per year    Active Member of [employer] or Organizations: Yes    Attends Banker Meetings: More than 4 times per year    Marital Status: Never married  Intimate Partner Violence: Not At Risk (03/05/2023)   Humiliation, Afraid, Rape, and Kick questionnaire    Fear of Current or Ex-Partner: No    Emotionally Abused: No    Physically Abused: No    Sexually Abused: No    Outpatient Medications Prior to Visit  Medication Sig Dispense Refill   acetaminophen-codeine (TYLENOL #3) 300-30 MG tablet Take 1 tablet by mouth every 6 (six) hours as needed.     APPLE CIDER VINEGAR PO Take 1 tablet by mouth daily.     atorvastatin (LIPITOR) 40 MG tablet Take 1 tablet (40 mg total) by mouth daily. 90 tablet 3   celecoxib (CELEBREX) 100 MG capsule Take 1 capsule (100 mg total) by mouth 2 (two) times daily as needed for up to 420 doses. 60 capsule 6   Continuous Glucose Sensor (FREESTYLE LIBRE 3 SENSOR) MISC 1 Piece by Does not apply route every 14 (fourteen) days. Place 1 sensor on the skin every 14 days. Use to check glucose continuously 2 each 2   cyclobenzaprine (FLEXERIL) 10 MG tablet Take 1 tablet (10 mg total) by mouth 2 (two) times daily as needed for muscle spasms. 30 tablet 3   diclofenac Sodium (VOLTAREN ARTHRITIS PAIN) 1 % GEL 2 gram qid prn 100 g 11   diphenhydrAMINE (BENADRYL) 25 MG tablet Take 25 mg by mouth every 6 (six) hours as needed for allergies.     docusate sodium (COLACE) 50 MG capsule Take 1 capsule (50 mg total) by mouth 2 (two) times daily. (Patient taking differently: Take 50 mg by mouth daily as needed for moderate constipation.) 60 capsule 11   DULoxetine (CYMBALTA) 60 MG  capsule Take 1 capsule (60 mg total) by mouth daily. 90 capsule 3   ELDERBERRY PO Take 1,150 mg by mouth daily.     Erenumab-aooe (AIMOVIG) 70 MG/ML SOAJ Inject 70 mg into the skin every 30 (thirty) days. 1.12 mL 11   ezetimibe (ZETIA) 10 MG tablet Take 1  tablet (10 mg total) by mouth daily. 90 tablet 3   ferrous sulfate (EQL SLOW RELEASE IRON) 160 (50 Fe) MG TBCR SR tablet Take 1 tablet (160 mg total) by mouth daily. 30 tablet 3   gabapentin (NEURONTIN) 300 MG capsule Take 1 capsule (300 mg total) by mouth 3 (three) times daily. 90 capsule 11   ibuprofen (ADVIL,MOTRIN) 200 MG tablet Take 200 mg by mouth as needed for headache, moderate pain or mild pain.     insulin degludec (TRESIBA FLEXTOUCH) 200 UNIT/ML FlexTouch Pen Inject 20 Units into the skin at bedtime. 12 mL 2   Insulin Pen Needle (BD PEN NEEDLE NANO U/F) 32G X 4 MM MISC 1 each by Does not apply route 4 (four) times daily. 100 each 2   lisinopril (ZESTRIL) 20 MG tablet TAKE 1 TABLET(20 MG) BY MOUTH DAILY 30 tablet 11   meclizine (ANTIVERT) 25 MG tablet Take 1 tablet (25 mg total) by mouth 3 (three) times daily as needed for dizziness. 30 tablet 6   metFORMIN (GLUCOPHAGE) 500 MG tablet TAKE 1 TABLET(500 MG) BY MOUTH TWICE DAILY WITH A MEAL 60 tablet 2   Multiple Vitamin (MULTIVITAMIN) capsule Take 1 capsule by mouth daily.     mupirocin ointment (BACTROBAN) 2 % Apply 1 Application topically 2 (two) times daily. 30 g 1   polyethylene glycol powder (GLYCOLAX/MIRALAX) 17 GM/SCOOP powder Take 1 capful (17 g) in water by mouth daily. (Patient taking differently: Take 17 g by mouth daily as needed for moderate constipation.) 238 g 0   polyethylene glycol-electrolytes (NULYTELY) 420 g solution As directed 4000 mL 0   PRESCRIPTION MEDICATION Place 1 drop into both eyes at bedtime.     rizatriptan (MAXALT) 10 MG tablet Take 1 tablet (10 mg total) by mouth as needed for migraine. May repeat in 2 hours if needed. Max dose: 2/24 hr or 15/30 days. 45  tablet 3   traZODone (DESYREL) 50 MG tablet Take 2 tablets (100 mg total) by mouth at bedtime. 60 tablet 11   triamcinolone cream (KENALOG) 0.1 % Apply 1 Application topically 2 (two) times daily. 30 g 0   Vitamin D, Ergocalciferol, (DRISDOL) 1.25 MG (50000 UNIT) CAPS capsule TAKE ONE CAPSULE BY MOUTH EVERY 7 DAYS 20 capsule 0   amoxicillin (AMOXIL) 500 MG capsule Take 500 mg by mouth every 8 (eight) hours.     cephALEXin (KEFLEX) 500 MG capsule Take 1 capsule (500 mg total) by mouth 4 (four) times daily. 20 capsule 0   Continuous Glucose Receiver (FREESTYLE LIBRE 3 READER) DEVI 1 Piece by Does not apply route once as needed for up to 1 dose. 1 each 0   No facility-administered medications prior to visit.    No Known Allergies  ROS Review of Systems  Constitutional:  Negative for chills and fever.  Eyes:  Negative for visual disturbance.  Respiratory:  Negative for chest tightness and shortness of breath.   Neurological:  Negative for dizziness and headaches.      Objective:    Physical Exam  BP 138/86 (BP Location: Left Arm)   Pulse 98   Ht 5' 6.5" (1.689 m)   Wt 289 lb 0.6 oz (131.1 kg)   SpO2 98%   BMI 45.95 kg/m  Wt Readings from Last 3 Encounters:  11/27/23 289 lb 0.6 oz (131.1 kg)  10/02/23 285 lb 6.4 oz (129.5 kg)  08/28/23 279 lb 9.6 oz (126.8 kg)    Lab Results  Component Value Date  TSH 1.490 08/24/2023   Lab Results  Component Value Date   WBC 6.6 04/25/2023   HGB 10.9 (L) 04/25/2023   HCT 35.2 04/25/2023   MCV 83 04/25/2023   PLT 407 04/25/2023   Lab Results  Component Value Date   NA 137 08/24/2023   K 4.9 08/24/2023   CO2 23 08/24/2023   GLUCOSE 247 (H) 08/24/2023   BUN 14 08/24/2023   CREATININE 0.87 08/24/2023   BILITOT 0.5 08/24/2023   ALKPHOS 105 08/24/2023   AST 12 08/24/2023   ALT 14 08/24/2023   PROT 7.3 08/24/2023   ALBUMIN 4.3 08/24/2023   CALCIUM 9.3 08/24/2023   ANIONGAP 7 12/26/2021   EGFR 81 08/24/2023   Lab Results   Component Value Date   CHOL 207 (H) 08/24/2023   Lab Results  Component Value Date   HDL 84 08/24/2023   Lab Results  Component Value Date   LDLCALC 112 (H) 08/24/2023   Lab Results  Component Value Date   TRIG 60 08/24/2023   Lab Results  Component Value Date   CHOLHDL 2.5 08/24/2023   Lab Results  Component Value Date   HGBA1C 9.8 (A) 08/28/2023      Assessment & Plan:  Essential hypertension, benign Assessment & Plan: Controlled She denies headaches, dizziness, blurred vision, and chest pain She is stable on lisinopril 20 mg daily Encouraged low sodium diet with increased physical activity BP Readings from Last 3 Encounters:  11/27/23 138/86  10/02/23 138/88  08/28/23 132/80      Mixed hyperlipidemia Assessment & Plan: LDL is not at goal of less than 70 She has been compliant with Lipitor 40 mg daily and Zetia 10 mg daily. We will likely increase Lipitor to 80 mg daily if her cholesterol is not at goal. She is encouraged to decrease her intake of greasy, fatty, and starchy foods and increase physical activity for better cholesterol control. The patient verbalized understanding and is aware of the plan of care. Lab Results  Component Value Date   CHOL 207 (H) 08/24/2023   HDL 84 08/24/2023   LDLCALC 112 (H) 08/24/2023   TRIG 60 08/24/2023   CHOLHDL 2.5 08/24/2023     Orders: -     Lipid panel -     CMP14+EGFR -     CBC with Differential/Platelet  Uncontrolled type 2 diabetes mellitus with hyperglycemia [facility]) Assessment & Plan: The patient is currently under the care of endocrinology. She reports taking Trulicity 3 mg weekly, metformin 500 mg twice daily, and is compliant with her bedtime insulin, Degludec Evaristo Bury) 20 units. She denies polyuria, polyphagia, and polydipsia. She has an appointment with her endocrinologist in a few weeks. We will obtain a hemoglobin A1c today and inform her provider of the results. The patient has decreased her intake of  high-sugar foods and beverages and has increased her physical activity. The patient verbalized understanding and is aware of the plan of care. Lab Results  Component Value Date   HGBA1C 9.8 (A) 08/28/2023     Orders: -     Hemoglobin A1c  Vitamin D deficiency -     VITAMIN D 25 Hydroxy (Vit-D Deficiency, Fractures)  TSH (thyroid-stimulating hormone deficiency) -     TSH + free T4   Note: This chart has been completed using Engineer, civil (consulting) software, and while attempts have been made to ensure accuracy, certain words and phrases may not be transcribed as intended.   Follow-up: No follow-ups on file.  Gilmore Laroche, FNP

## 2023-11-27 NOTE — Patient Instructions (Addendum)
I appreciate the opportunity to provide care to you today!    Follow up:  4 months  Labs: please stop by the lab today to get your blood drawn (CBC, CMP, TSH, hga1c, Lipid profile, Vit D)   Here are some foods to avoid or reduce in your diet to help manage cholesterol levels:  Fried Foods:Deep-fried items such as french fries, fried chicken, and fried snacks are high in unhealthy fats and can raise LDL (bad) cholesterol levels. Processed Meats:Foods like bacon, sausage, hot dogs, and deli meats are often high in saturated fat and cholesterol. Full-Fat Dairy Products:Whole milk, full-fat yogurt, butter, cream, and cheese are rich in saturated fats, which can increase cholesterol levels. Baked Goods and Sweets:Pastries, cakes, cookies, and donuts often contain trans fats and added sugars, which can raise LDL cholesterol and lower HDL (good) cholesterol. Red Meat:Beef, lamb, and pork are high in saturated fat. Lean cuts or plant-based protein alternatives are better options. Lard and Shortening:Used in some baked goods, lard and shortening are high in trans fats and should be avoided. Fast Food:Many fast food items are cooked with unhealthy oils and contain high amounts of saturated and trans fats. Processed Snacks:Chips, crackers, and certain microwave popcorns can contain trans fats and high levels of unhealthy oils. Shellfish:While nutritious in other ways, some shellfish like shrimp, lobster, and crab are high in cholesterol. They should be consumed in moderation. Coconut and Palm Oils:these oils are high in saturated fat and can raise cholesterol levels when used in cooking or baking.      Attached with your AVS, you will find valuable resources for self-education. I highly recommend dedicating some time to thoroughly examine them.   Please continue to a heart-healthy diet and increase your physical activities. Try to exercise for at least five days a week.    It was a pleasure  to see you and I look forward to continuing to work together on your health and well-being. Please do not hesitate to call the office if you need care or have questions about your care.  In case of emergency, please visit the Emergency Department for urgent care, or contact our clinic at [phone] to schedule an appointment. We're here to help you!   Have a wonderful day and week. With Gratitude, Gilmore Laroche MSN, FNP-BC

## 2023-11-28 ENCOUNTER — Encounter: Payer: Self-pay | Admitting: "Endocrinology

## 2023-11-28 LAB — CBC WITH DIFFERENTIAL/PLATELET
Basophils Absolute: 0 10*3/uL (ref 0.0–0.2)
Basos: 0 %
EOS (ABSOLUTE): 0.1 10*3/uL (ref 0.0–0.4)
Eos: 1 %
Hematocrit: 37.2 % (ref 34.0–46.6)
Hemoglobin: 11.3 g/dL (ref 11.1–15.9)
Immature Grans (Abs): 0 10*3/uL (ref 0.0–0.1)
Immature Granulocytes: 0 %
Lymphocytes Absolute: 1.3 10*3/uL (ref 0.7–3.1)
Lymphs: 24 %
MCH: 26 pg — ABNORMAL LOW (ref 26.6–33.0)
MCHC: 30.4 g/dL — ABNORMAL LOW (ref 31.5–35.7)
MCV: 86 fL (ref 79–97)
Monocytes Absolute: 0.5 10*3/uL (ref 0.1–0.9)
Monocytes: 8 %
Neutrophils Absolute: 3.8 10*3/uL (ref 1.4–7.0)
Neutrophils: 67 %
Platelets: 360 10*3/uL (ref 150–450)
RBC: 4.35 x10E6/uL (ref 3.77–5.28)
RDW: 13.7 % (ref 11.7–15.4)
WBC: 5.6 10*3/uL (ref 3.4–10.8)

## 2023-11-28 LAB — CMP14+EGFR
ALT: 13 [IU]/L (ref 0–32)
AST: 14 [IU]/L (ref 0–40)
Albumin: 4.1 g/dL (ref 3.8–4.9)
Alkaline Phosphatase: 97 [IU]/L (ref 44–121)
BUN/Creatinine Ratio: 13 (ref 9–23)
BUN: 9 mg/dL (ref 6–24)
Bilirubin Total: 0.5 mg/dL (ref 0.0–1.2)
CO2: 23 mmol/L (ref 20–29)
Calcium: 9 mg/dL (ref 8.7–10.2)
Chloride: 102 mmol/L (ref 96–106)
Creatinine, Ser: 0.68 mg/dL (ref 0.57–1.00)
Globulin, Total: 2.9 g/dL (ref 1.5–4.5)
Glucose: 243 mg/dL — ABNORMAL HIGH (ref 70–99)
Potassium: 4.1 mmol/L (ref 3.5–5.2)
Sodium: 140 mmol/L (ref 134–144)
Total Protein: 7 g/dL (ref 6.0–8.5)
eGFR: 105 mL/min/{1.73_m2} (ref >59)

## 2023-11-28 LAB — LIPID PANEL
Chol/HDL Ratio: 2.8 {ratio} (ref 0.0–4.4)
Cholesterol, Total: 233 mg/dL — ABNORMAL HIGH (ref 100–199)
HDL: 83 mg/dL (ref >39)
LDL Chol Calc (NIH): 131 mg/dL — ABNORMAL HIGH (ref 0–99)
Triglycerides: 112 mg/dL (ref 0–149)
VLDL Cholesterol Cal: 19 mg/dL (ref 5–40)

## 2023-11-28 LAB — TSH+FREE T4
Free T4: 0.96 ng/dL (ref 0.82–1.77)
TSH: 1.18 u[IU]/mL (ref 0.450–4.500)

## 2023-11-28 LAB — VITAMIN D 25 HYDROXY (VIT D DEFICIENCY, FRACTURES): Vit D, 25-Hydroxy: 18.2 ng/mL — ABNORMAL LOW (ref 30.0–100.0)

## 2023-11-28 LAB — HEMOGLOBIN A1C
Est. average glucose Bld gHb Est-mCnc: 223 mg/dL
Hgb A1c MFr Bld: 9.4 % — ABNORMAL HIGH (ref 4.8–5.6)

## 2023-11-29 ENCOUNTER — Other Ambulatory Visit: Payer: Self-pay | Admitting: "Endocrinology

## 2023-11-29 DIAGNOSIS — E113593 Type 2 diabetes mellitus with proliferative diabetic retinopathy without macular edema, bilateral: Secondary | ICD-10-CM | POA: Diagnosis not present

## 2023-11-29 MED ORDER — TIRZEPATIDE 2.5 MG/0.5ML ~~LOC~~ SOAJ: 2.5000 mg | SUBCUTANEOUS | 0 refills | Status: DC

## 2023-12-20 ENCOUNTER — Other Ambulatory Visit: Payer: Self-pay | Admitting: Family Medicine

## 2023-12-20 DIAGNOSIS — E559 Vitamin D deficiency, unspecified: Secondary | ICD-10-CM

## 2023-12-20 MED ORDER — VITAMIN D (ERGOCALCIFEROL) 1.25 MG (50000 UNIT) PO CAPS: ORAL_CAPSULE | ORAL | 1 refills | Status: DC

## 2024-01-03 ENCOUNTER — Other Ambulatory Visit: Payer: Self-pay | Admitting: Family Medicine

## 2024-01-03 DIAGNOSIS — E1165 Type 2 diabetes mellitus with hyperglycemia: Secondary | ICD-10-CM

## 2024-01-04 ENCOUNTER — Telehealth: Payer: Self-pay

## 2024-01-04 NOTE — Progress Notes (Signed)
Care Guide Pharmacy Note  01/04/2024 Name: [name] MRN: [id number] DOB: 02/03/1971  Referred By: Gilmore Laroche, FNP Reason for referral: Care Coordination (TNM Diabetes. )   Katrina Montes is a 53 y.o. year old female who is a primary care patient of Gilmore Laroche, FNP.  [name] was referred to the pharmacist for assistance related to: DMII  An unsuccessful telephone outreach was attempted today to contact the patient who was referred to the pharmacy team for assistance with diabetes. Additional attempts will be made to contact the patient.  [facility]  [facility] Health Care Management Assistant Direct Dial: [phone]  Fax: [phone]

## 2024-01-08 ENCOUNTER — Telehealth: Payer: Self-pay

## 2024-01-08 NOTE — Progress Notes (Signed)
Care Guide Pharmacy Note  01/08/2024 Name: [name] MRN: [id number] DOB: 01-21-1971  Referred By: Gilmore Laroche, FNP Reason for referral: Care Coordination (TNM Diabetes. )   Katrina Montes is a 53 y.o. year old female who is a primary care patient of Gilmore Laroche, FNP.  [name] was referred to the pharmacist for assistance related to: DMII  A second unsuccessful telephone outreach was attempted today to contact the patient who was referred to the pharmacy team for assistance with diabetes. Additional attempts will be made to contact the patient.  [facility]  [facility], [facility] Health Care Management Assistant Direct Dial: [phone]  Fax: [phone]

## 2024-01-10 ENCOUNTER — Telehealth: Payer: Self-pay

## 2024-01-10 NOTE — Progress Notes (Signed)
Care Guide Pharmacy Note  01/10/2024 Name: Katrina Montes MRN: [id number] DOB: 06/08/1971  Referred By: Gilmore Laroche, FNP Reason for referral: Care Coordination (TNM Diabetes. )   Katrina Montes is a 53 y.o. year old female who is a primary care patient of Gilmore Laroche, FNP.  Katrina Montes was referred to the pharmacist for assistance related to: DMII  Successful contact was made with the patient to discuss pharmacy services including being ready for the pharmacist to call at least 5 minutes before the scheduled appointment time and to have medication bottles and any blood pressure readings ready for review. The patient agreed to meet with the pharmacist via telephone visit on (date/time). 02/13/24 at 9:00 a.m.   [facility]  [facility], [facility] Health Care Management Assistant Direct Dial: [phone]  Fax: [phone]

## 2024-01-20 ENCOUNTER — Ambulatory Visit
Admission: EM | Admit: 2024-01-20 | Discharge: 2024-01-20 | Disposition: A | Discharge status: Home / Self Care | Payer: Medicare Other | Attending: Nurse Practitioner | Admitting: Nurse Practitioner

## 2024-01-20 DIAGNOSIS — J0100 Acute maxillary sinusitis, unspecified: Secondary | ICD-10-CM

## 2024-01-20 MED ORDER — AMOXICILLIN-POT CLAVULANATE 875-125 MG PO TABS: 1.0000 | ORAL_TABLET | Freq: Two times a day (BID) | ORAL | 0 refills | Status: DC

## 2024-01-20 MED ORDER — PROMETHAZINE-DM 6.25-15 MG/5ML PO SYRP: 5.0000 mL | ORAL_SOLUTION | Freq: Four times a day (QID) | ORAL | 0 refills | Status: DC | PRN

## 2024-01-20 MED ORDER — FLUTICASONE PROPIONATE 50 MCG/ACT NA SUSP: 2.0000 | Freq: Every day | NASAL | 0 refills | Status: AC

## 2024-01-20 NOTE — ED Provider Notes (Signed)
[facility]    CSN: [id number] Arrival date & time: 01/20/24  1050      History   Chief Complaint No chief complaint on file.   HPI Meron Suhani Stillion is a 53 y.o. female.   The history is provided by the patient.   Patient presents with a 3-week history of nasal congestion, sinus pressure, bilateral ear pain pressure, sore throat, and cough.  Patient states initially when symptoms started, she did have a fever, that has since improved.  Denies headache, ear drainage, wheezing, difficulty breathing, chest pain, abdominal pain, nausea, vomiting, diarrhea, or rash.  Patient states that she has been using over-the-counter cough and cold medications, and that she had to amoxicillin left from the prior prescription that she took.  She states her symptoms improved for about 1 day and a half.   Past Medical History:  Diagnosis Date   Anemia    Anxiety    Back pain    Carpal tunnel syndrome    Constipation    Depression    Diabetes mellitus    Diabetes mellitus, type II (HCC)    Hyperlipidemia    Hypertension    Joint pain    Lactose intolerance    Multiple sclerosis (HCC) 2016   Optic neuritis    Shortness of breath    Sleep apnea    Swallowing difficulty    Swelling of both lower extremities    Vision, loss, sudden    Vitamin D deficiency     Patient Active Problem List   Diagnosis Date Noted   Abscess of back 01/24/2023   Sciatica of left side 01/24/2023   Acne vulgaris 09/12/2022   Fracture of fourth metatarsal bone of left foot with delayed healing 09/12/2022   Mild episode of recurrent major depressive disorder (HCC) 05/09/2022   Annual physical exam 03/28/2022   Diabetic ulcer of back (HCC) 03/28/2022   Mixed hyperlipidemia 02/13/2022   Uncontrolled type 2 diabetes mellitus with hyperglycemia (HCC) 01/27/2022   Screening for colon cancer 01/27/2022   Need for immunization against influenza 01/27/2022   Neuropathy 01/27/2022   Prolonged QT  interval 12/23/2021   Emphysematous cystitis 12/23/2021   Hypokalemia 12/23/2021   Vaginal candidiasis 12/23/2021   Left buttock abscess 12/22/2021   Chronic migraine w/o aura w/o status migrainosus, not intractable 08/01/2021   Fatigue 08/01/2021   Type 2 diabetes mellitus with hyperglycemia (HCC) 08/01/2021   Other fatigue 01/19/2020   Bilateral hearing loss 01/19/2020   Bilateral carpal tunnel syndrome 09/17/2019   OSA (obstructive sleep apnea) 06/04/2019   Tommi Rumps Quervain's disease (radial styloid tenosynovitis) 11/06/2018   Gait abnormality 04/04/2018   Depression 01/29/2017   Migraine without aura and without status migrainosus, not intractable 03/30/2015   Dizziness, nonspecific 03/30/2015   Left lumbar radiculopathy 03/09/2015   Right optic neuritis 01/14/2015   Multiple sclerosis (HCC) 01/14/2015   OVARIAN CYST 02/09/2011   Morbid obesity (HCC) 10/24/2010   Poorly controlled type 2 diabetes mellitus (HCC) 09/22/2010   Vitamin D deficiency 09/22/2010   ANEMIA-IRON DEFICIENCY 09/22/2010   Essential hypertension, benign 09/22/2010    Past Surgical History:  Procedure Laterality Date   BREAST REDUCTION SURGERY     COLONOSCOPY WITH PROPOFOL N/A 06/02/2022   Procedure: COLONOSCOPY WITH PROPOFOL;  Surgeon: Corbin Ade, MD;  [facility]: [facility];  Service: Endoscopy;  Laterality: N/A;  8:45AM   CYST EXCISION     INCISION AND DRAINAGE ABSCESS Left 12/23/2021   Procedure: INCISION AND DRAINAGE BUTTOCK  ABSCESS;  Surgeon: Almond Lint, MD;  Location: [facility] OR;  Service: General;  Laterality: Left;   MOUTH SURGERY  09/2023   ovarian cyst removed     TUBAL LIGATION     WISDOM TOOTH EXTRACTION      OB History     Gravida  0   Para  0   Term  0   Preterm  0   AB  0   Living  0      SAB  0   IAB  0   Ectopic  0   Multiple  0   Live Births  0            Home Medications    Prior to Admission medications   Medication Sig Start Date End Date Taking?  Authorizing Provider  acetaminophen-codeine (TYLENOL #3) 300-30 MG tablet Take 1 tablet by mouth every 6 (six) hours as needed. 09/26/23   [provider]  APPLE CIDER VINEGAR PO Take 1 tablet by mouth daily.    [provider]  atorvastatin (LIPITOR) 40 MG tablet Take 1 tablet (40 mg total) by mouth daily. 04/29/23   Gilmore Laroche, FNP  celecoxib (CELEBREX) 100 MG capsule Take 1 capsule (100 mg total) by mouth 2 (two) times daily as needed for up to 420 doses. 04/03/23   Levert Feinstein, MD  Continuous Glucose Receiver (FREESTYLE LIBRE 3 READER) DEVI Use to test BG 4+ times daily. E11.65 11/27/23   Roma Kayser, MD  Continuous Glucose Sensor (FREESTYLE LIBRE 3 SENSOR) MISC CHANGE SENSOR EVERY 14 DAYS 11/29/23   Roma Kayser, MD  cyclobenzaprine (FLEXERIL) 10 MG tablet Take 1 tablet (10 mg total) by mouth 2 (two) times daily as needed for muscle spasms. 02/02/22   Glean Salvo, NP  diclofenac Sodium (VOLTAREN ARTHRITIS PAIN) 1 % GEL 2 gram qid prn 04/03/23   Levert Feinstein, MD  diphenhydrAMINE (BENADRYL) 25 MG tablet Take 25 mg by mouth every 6 (six) hours as needed for allergies.    [provider]  docusate sodium (COLACE) 50 MG capsule Take 1 capsule (50 mg total) by mouth 2 (two) times daily. Patient taking differently: Take 50 mg by mouth daily as needed for moderate constipation. 03/09/15   Levert Feinstein, MD  DULoxetine (CYMBALTA) 60 MG capsule Take 1 capsule (60 mg total) by mouth daily. 09/26/22   Glean Salvo, NP  ELDERBERRY PO Take 1,150 mg by mouth daily.    [provider]  Erenumab-aooe (AIMOVIG) 70 MG/ML SOAJ Inject 70 mg into the skin every 30 (thirty) days. 04/03/23   Levert Feinstein, MD  ezetimibe (ZETIA) 10 MG tablet Take 1 tablet (10 mg total) by mouth daily. 04/29/23   Gilmore Laroche, FNP  ferrous sulfate (EQL SLOW RELEASE IRON) 160 (50 Fe) MG TBCR SR tablet Take 1 tablet (160 mg total) by mouth daily. 04/29/23   Gilmore Laroche, FNP  gabapentin  (NEURONTIN) 300 MG capsule Take 1 capsule (300 mg total) by mouth 3 (three) times daily. 02/20/23   Levert Feinstein, MD  ibuprofen (ADVIL,MOTRIN) 200 MG tablet Take 200 mg by mouth as needed for headache, moderate pain or mild pain.    [provider]  insulin degludec (TRESIBA FLEXTOUCH) 200 UNIT/ML FlexTouch Pen Inject 20 Units into the skin at bedtime. 08/28/23   Roma Kayser, MD  Insulin Pen Needle (BD PEN NEEDLE NANO U/F) 32G X 4 MM MISC 1 each by Does not apply route 4 (four) times daily.  08/28/23   Roma Kayser, MD  lisinopril (ZESTRIL) 20 MG tablet TAKE 1 TABLET(20 MG) BY MOUTH DAILY 06/11/23   Gilmore Laroche, FNP  meclizine (ANTIVERT) 25 MG tablet Take 1 tablet (25 mg total) by mouth 3 (three) times daily as needed for dizziness. 07/19/20   Glean Salvo, NP  metFORMIN (GLUCOPHAGE) 500 MG tablet TAKE 1 TABLET(500 MG) BY MOUTH TWICE DAILY WITH A MEAL 01/03/24   Gilmore Laroche, FNP  Multiple Vitamin (MULTIVITAMIN) capsule Take 1 capsule by mouth daily.    [provider]  mupirocin ointment (BACTROBAN) 2 % Apply 1 Application topically 2 (two) times daily. 01/24/23   Gilmore Laroche, FNP  polyethylene glycol powder (GLYCOLAX/MIRALAX) 17 GM/SCOOP powder Take 1 capful (17 g) in water by mouth daily. Patient taking differently: Take 17 g by mouth daily as needed for moderate constipation. 12/30/21   Ghimire, Werner Lean, MD  polyethylene glycol-electrolytes (NULYTELY) 420 g solution As directed 04/28/22   Rourk, Gerrit Friends, MD  PRESCRIPTION MEDICATION Place 1 drop into both eyes at bedtime.    [provider]  rizatriptan (MAXALT) 10 MG tablet Take 1 tablet (10 mg total) by mouth as needed for migraine. May repeat in 2 hours if needed. Max dose: 2/24 hr or 15/30 days. 09/26/22   Glean Salvo, NP  tirzepatide [facility]) 2.5 MG/0.5ML Pen Inject 2.5 mg into the skin once a week. 11/29/23   Roma Kayser, MD  traZODone (DESYREL) 50 MG tablet Take 2 tablets (100 mg  total) by mouth at bedtime. 04/03/23   Levert Feinstein, MD  triamcinolone cream (KENALOG) 0.1 % Apply 1 Application topically 2 (two) times daily. 09/12/22   Gilmore Laroche, FNP  Vitamin D, Ergocalciferol, (DRISDOL) 1.25 MG (50000 UNIT) CAPS capsule TAKE ONE CAPSULE BY MOUTH EVERY 7 DAYS 12/20/23   Gilmore Laroche, FNP    Family History Family History  Problem Relation Age of Onset   Cancer Mother    Diabetes Mother    Hearing loss Mother    Thyroid nodules Mother    Anemia Mother    Kidney disease Mother    Hypertension Mother    Obesity Mother    Breast cancer Mother    Hyperlipidemia Father    High blood pressure Father    Sleep apnea Father    Pancreatic cancer Father    Anemia Sister    Anemia Sister    Arthritis Maternal Grandmother    Arthritis Maternal Grandfather    Arthritis Paternal Grandmother    Kidney disease Paternal Grandmother    Hypertension Paternal Grandmother    Arthritis Paternal Grandfather    Vision loss Paternal Grandfather    Stroke Paternal Grandfather    Hyperlipidemia Paternal Grandfather    Pancreatic cancer Maternal Uncle    Kidney disease Paternal Uncle    Colon cancer Neg Hx    Colon polyps Neg Hx     Social History Social History   Tobacco Use   Smoking status: Never   Smokeless tobacco: Never  Vaping Use   Vaping status: Never Used  Substance Use Topics   Alcohol use: No   Drug use: No     Allergies   Patient has no known allergies.   Review of Systems Review of Systems Per HPI  Physical Exam Triage Vital Signs ED Triage Vitals  Encounter Vitals Group     BP 01/20/24 1253 (!) 146/83     Systolic BP Percentile --      Diastolic BP Percentile --  Pulse Rate 01/20/24 1253 (!) 104     Resp 01/20/24 1253 20     Temp 01/20/24 1253 97.6 F (36.4 C)     Temp Source 01/20/24 1253 Oral     SpO2 01/20/24 1253 98 %     Weight --      Height --      Head Circumference --      Peak Flow --      Pain Score 01/20/24 1257 6      Pain Loc --      Pain Education --      Exclude from Growth Chart --    No data found.  Updated Vital Signs BP (!) 146/83 (BP Location: Right Arm)   Pulse (!) 104   Temp 97.6 F (36.4 C) (Oral)   Resp 20   LMP 12/24/2023 (Within Weeks)   SpO2 98%   Visual Acuity Right Eye Distance:   Left Eye Distance:   Bilateral Distance:    Right Eye Near:   Left Eye Near:    Bilateral Near:     Physical Exam Vitals and nursing note reviewed.  Constitutional:      General: She is not in acute distress.    Appearance: Normal appearance.  HENT:     Head: Normocephalic.     Right Ear: Tympanic membrane, ear canal and external ear normal.     Left Ear: Tympanic membrane, ear canal and external ear normal.     Nose:     Right Turbinates: Enlarged and swollen.     Left Turbinates: Enlarged and swollen.     Right Sinus: Maxillary sinus tenderness present. No frontal sinus tenderness.     Left Sinus: Maxillary sinus tenderness present. No frontal sinus tenderness.     Mouth/Throat:     Lips: Pink.     Mouth: Mucous membranes are moist.     Pharynx: Uvula midline. Posterior oropharyngeal erythema and postnasal drip present. No pharyngeal swelling, oropharyngeal exudate or uvula swelling.  Eyes:     Extraocular Movements: Extraocular movements intact.     Pupils: Pupils are equal, round, and reactive to light.  Cardiovascular:     Rate and Rhythm: Regular rhythm. Tachycardia present.     Pulses: Normal pulses.     Heart sounds: Normal heart sounds.  Pulmonary:     Effort: Pulmonary effort is normal.     Breath sounds: Normal breath sounds.  Abdominal:     General: Bowel sounds are normal.     Palpations: Abdomen is soft.     Tenderness: There is no abdominal tenderness.  Musculoskeletal:     Cervical back: Normal range of motion.  Skin:    General: Skin is warm and dry.  Neurological:     General: No focal deficit present.     Mental Status: She is alert and oriented to  person, place, and time.  Psychiatric:        Mood and Affect: Mood normal.        Behavior: Behavior normal.      UC Treatments / Results  Labs (all labs ordered are listed, but only abnormal results are displayed) Labs Reviewed - No data to display  EKG   Radiology No results found.  Procedures Procedures (including critical care time)  Medications Ordered in UC Medications - No data to display  Initial Impression / Assessment and Plan / UC Course  I have reviewed the triage vital signs and the nursing notes.  Pertinent  labs & imaging results that were available during my care of the patient were reviewed by me and considered in my medical decision making (see chart for details).  On exam, lung sounds are clear throughout, room air sats at 98%.  Symptoms consistent with acute maxillary sinusitis.  Will treat with Augmentin 875/125 mg tablets twice daily for the next 7 days, fluticasone 50 mcg nasal spray for nasal congestion and runny nose, and Promethazine DM for her cough.  Supportive care recommendations were provided and discussed with the patient to include fluids, rest, normal saline nasal spray, and use of a humidifier during sleep.  Discussed indications with the patient regarding follow-up.  Patient was in agreement with this plan of care and verbalized understanding.  All questions were answered.  Patient stable for discharge.  Final Clinical Impressions(s) / UC Diagnoses   Final diagnoses:  None   Discharge Instructions   None    ED Prescriptions   None    PDMP not reviewed this encounter.   Abran Cantor, NP 01/20/24 1329

## 2024-01-20 NOTE — ED Triage Notes (Signed)
Pt reports cough, congestion, sore throat  x 3 weeks, states she took two amoxicillin that seemed to help with her sx's for about 1.5 days

## 2024-01-20 NOTE — Discharge Instructions (Addendum)
Take medication as directed. Continue your current allergy medication regimen. Increase fluids and get plenty of rest. May take over-the-counter Ibuprofen or Tylenol as needed for pain, fever, or general discomfort. Recommend normal saline nasal spray to help with nasal congestion throughout the day. For your throat pain, recommend over-the-counter Chloraseptic throat spray and throat lozenges while symptoms persist. For your cough, it may be helpful to use a humidifier during sleep and to sleep elevated on pillows while symptoms persist. If symptoms do not improve with this treatment, you may follow-up in this clinic or with your primary care physician for further evaluation. Follow-up as needed.

## 2024-02-04 ENCOUNTER — Ambulatory Visit: Payer: Medicare Other | Admitting: "Endocrinology

## 2024-02-05 ENCOUNTER — Encounter: Payer: Self-pay | Admitting: "Endocrinology

## 2024-02-05 ENCOUNTER — Encounter: Payer: Self-pay | Admitting: Neurology

## 2024-02-07 ENCOUNTER — Telehealth: Payer: Self-pay

## 2024-02-07 NOTE — Telephone Encounter (Signed)
 Patient was identified as falling into the [place] Measure - Diabetes.   Patient was: Appointment scheduled for lab or office visit for A1c.

## 2024-02-13 ENCOUNTER — Other Ambulatory Visit: Payer: Self-pay | Admitting: Pharmacist

## 2024-02-13 NOTE — Progress Notes (Signed)
 02/13/2024 Name: Katrina Montes MRN: [id number] DOB: 04-20-1971  Chief Complaint  Patient presents with   Diabetes    Katrina Montes is a 53 y.o. year old female who presented for a telephone visit.   They were referred to the pharmacist by a quality report for assistance in managing diabetes.  Patient was identified as falling into the [place] Measure - Diabetes.   Patient was: Referred to pharmacy for chronic disease management.    Subjective:  Care Team: Primary Care Provider: Gilmore Laroche, FNP ; Next Scheduled Visit: 04/01/24 Endocrinologist Dr. Fransico Him; Next Scheduled Visit: 04/02/24  Medication Access/Adherence  Current Pharmacy:  [id number] - [place], [place] - [address] [place] [address] Phone: [phone] Fax: [phone]  [facility] [address] [place] - [address] [place] [address] [place] [address] [place] [address] Phone: [phone] Fax: [phone]  [facility] [address] [place] [address] Phone: [phone] Fax: [phone]  [facility] [address] [place] [address] Phone: [phone] Fax: [phone]  [facility] - [place], [place] - [address] [place] [address] Phone: [phone] Fax: [phone]  [facility] - [place], [place] - [address] [place] [address] Phone: [phone] Fax: [phone]   Patient reports affordability concerns with their medications: Yes --Greggory Keen is $386 due to deductible Patient reports access/transportation concerns to their pharmacy: No  Patient reports adherence concerns with their medications:   cost     Diabetes:  Current medications: tresiba, metformin Medications tried in the past: trulicity, mounjaro  Using FSL 3  CGM  Current physical activity: encouraged as able  Current medication access support: commercial   Objective:  Lab Results  Component Value Date   HGBA1C 9.4 (H) 11/27/2023    Lab Results  Component Value Date   CREATININE 0.68 11/27/2023   BUN 9 11/27/2023   NA 140 11/27/2023   K 4.1 11/27/2023   CL 102 11/27/2023   CO2 23 11/27/2023    Lab Results  Component Value Date   CHOL 233 (H) 11/27/2023   HDL 83 11/27/2023   LDLCALC 131 (H) 11/27/2023   TRIG 112 11/27/2023   CHOLHDL 2.8 11/27/2023    Medications Reviewed Today     Reviewed by Danella Maiers, [facility] (Pharmacist) on 02/13/24 at 1127  Med List Status: <None>   Medication Order Taking? Sig Documenting Provider Last Dose Status Informant  acetaminophen-codeine (TYLENOL #3) 300-30 MG tablet [id number]  Take 1 tablet by mouth every 6 (six) hours as needed. [provider]  Active   amoxicillin-clavulanate (AUGMENTIN) 875-125 MG tablet [id number]  Take 1 tablet by mouth every 12 (twelve) hours. Leath-WarrenSadie Haber, NP  Active   APPLE CIDER VINEGAR PO [id number]  Take 1 tablet by mouth daily. [provider]  Active Self  atorvastatin (LIPITOR) 40 MG tablet [id number]  Take 1 tablet (40 mg total) by mouth daily. Gilmore Laroche, FNP  Active   celecoxib (CELEBREX) 100 MG capsule [id number]  Take 1 capsule (100 mg total) by mouth 2 (two) times daily as needed for up to 420 doses. Levert Feinstein, MD  Active   Continuous Glucose Receiver (FREESTYLE LIBRE 3 READER) DEVI [id number]  Use to test BG 4+ times daily. E11.65 Nida,  Denman George, MD  Active   Continuous Glucose Sensor (FREESTYLE [place] 3 SENSOR) [place] [id number]  CHANGE SENSOR EVERY 14 DAYS Nida, Denman George, MD  Active   cyclobenzaprine (FLEXERIL) 10 MG tablet [id number]  Take 1 tablet (10 mg total) by mouth 2 (two) times daily as needed for muscle spasms. Glean Salvo, NP  Active Self  diclofenac Sodium (VOLTAREN ARTHRITIS PAIN) 1 % GEL [id number]   2 gram qid prn Levert Feinstein, MD  Active   diphenhydrAMINE (BENADRYL) 25 MG tablet [id number]  Take 25 mg by mouth every 6 (six) hours as needed for allergies. [provider]  Active Self           Med Note Hart Rochester, Lafe Garin   Fri May 26, 2022  9:27 AM)    docusate sodium (COLACE) 50 MG capsule [id number]  Take 1 capsule (50 mg total) by mouth 2 (two) times daily.  Patient taking differently: Take 50 mg by mouth daily as needed for moderate constipation.   Levert Feinstein, MD  Active Self  DULoxetine (CYMBALTA) 60 MG capsule [id number]  Take 1 capsule (60 mg total) by mouth daily. Glean Salvo, NP  Active   ELDERBERRY PO [id number]  Take 1,150 mg by mouth daily. [provider]  Active Self  Erenumab-aooe (AIMOVIG) 70 MG/ML SOAJ [id number]  Inject 70 mg into the skin every 30 (thirty) days. Levert Feinstein, MD  Active   ezetimibe (ZETIA) 10 MG tablet [id number]  Take 1 tablet (10 mg total) by mouth daily. Gilmore Laroche, FNP  Active   ferrous sulfate (EQL SLOW RELEASE IRON) 160 (50 Fe) MG TBCR SR tablet [id number]  Take 1 tablet (160 mg total) by mouth daily. Gilmore Laroche, FNP  Active   fluticasone [facility]) 50 MCG/ACT nasal spray [id number]  Place 2 sprays into both nostrils daily. Leath-Warren, Sadie Haber, NP  Active   gabapentin (NEURONTIN) 300 MG capsule [id number]  Take 1 capsule (300 mg total) by mouth 3 (three) times daily. Levert Feinstein, MD  Active   ibuprofen (ADVIL,MOTRIN) 200 MG tablet [id number]  Take 200 mg by mouth as needed for headache, moderate pain or mild pain. [provider]  Active Self  insulin degludec (TRESIBA FLEXTOUCH) 200 UNIT/ML FlexTouch Pen [id number]  Inject 20 Units into the skin at bedtime. Roma Kayser, MD  Active   Insulin Pen Needle (BD PEN NEEDLE NANO U/F) 32G X 4 MM MISC [id number]  1 each by Does not apply route 4 (four) times daily. Roma Kayser, MD  Active   lisinopril (ZESTRIL) 20 MG tablet [id number]  TAKE 1 TABLET(20 MG) BY MOUTH  DAILY Gilmore Laroche, FNP  Active   meclizine (ANTIVERT) 25 MG tablet [id number]  Take 1 tablet (25 mg total) by mouth 3 (three) times daily as needed for dizziness. Glean Salvo, NP  Active Self  metFORMIN (GLUCOPHAGE) 500 MG tablet [id number]  TAKE 1 TABLET(500 MG) BY MOUTH TWICE DAILY WITH A MEAL Gilmore Laroche, FNP  Active   Multiple Vitamin (MULTIVITAMIN) capsule [id number]  Take 1 capsule by mouth daily. [provider]  Active Self  mupirocin ointment (BACTROBAN) 2 % [id number]  Apply 1 Application topically 2 (two) times daily. Gilmore Laroche, FNP  Active   polyethylene glycol powder (GLYCOLAX/MIRALAX) 17 GM/SCOOP powder [id number]  Take 1 capful (17 g) in water by mouth daily.  Patient taking differently: Take 17 g by mouth daily as needed for moderate constipation.   Maretta Bees, MD  Active Self  polyethylene glycol-electrolytes (NULYTELY) 420 g solution [id number]  As directed Corbin Ade, MD  Active Self  PRESCRIPTION MEDICATION [id number]  Place 1 drop into both eyes at bedtime. [provider]  Active Self           Med Note Talmadge Coventry, Bernardo Heater   Fri Dec 23, 2021  3:55 PM) Pt states she uses a prescription eye drop, no record of such in chart   promethazine-dextromethorphan (PROMETHAZINE-DM) 6.25-15 MG/5ML syrup [id number]  Take 5 mLs by mouth 4 (four) times daily as needed. Leath-Warren, Sadie Haber, NP  Active   rizatriptan (MAXALT) 10 MG tablet [id number]  Take 1 tablet (10 mg total) by mouth as needed for migraine. May repeat in 2 hours if needed. Max dose: 2/24 hr or 15/30 days. Glean Salvo, NP  Active   tirzepatide [facility]) 2.5 MG/0.5ML Pen [id number] No Inject 2.5 mg into the skin once a week.  Patient not taking: Reported on 02/13/2024   Roma Kayser, MD Not Taking Active   traZODone (DESYREL) 50 MG tablet [id number]  Take 2 tablets (100 mg total) by mouth at bedtime. Levert Feinstein, MD  Active   triamcinolone cream (KENALOG) 0.1 % [id number]   Apply 1 Application topically 2 (two) times daily. Gilmore Laroche, FNP  Active   Vitamin D, Ergocalciferol, (DRISDOL) 1.25 MG (50000 UNIT) CAPS capsule [id number]  TAKE ONE CAPSULE BY MOUTH EVERY 7 DAYS Gilmore Laroche, FNP  Active             Assessment/Plan:   Diabetes: - Currently uncontrolled - Reviewed long term cardiovascular and renal outcomes of uncontrolled blood sugar - Reviewed goal A1c, goal fasting, and goal 2 hour post prandial glucose - Recommend to  Obtain Mounjaro voucher--lilly rep to bring next week and I will deliver to patient Reach out to endo (patient sees Dr. Fransico Him for endo)  patient has deductible Continue meds as prescribed by endo - Patient denies personal or family history of multiple endocrine neoplasia type 2, medullary thyroid cancer; personal history of pancreatitis or gallbladder disease. - Recommend to check glucose using FSL3    Follow Up Plan: 1 month  [name], PharmD, BCACP, CPP Clinical Pharmacist, [facility]

## 2024-02-28 ENCOUNTER — Other Ambulatory Visit: Payer: Self-pay

## 2024-02-28 MED ORDER — OCRELIZUMAB 300 MG/10ML IV SOLN: 300.0000 mg | INTRAVENOUS | 0 refills | Status: DC

## 2024-03-03 ENCOUNTER — Telehealth: Payer: Self-pay | Admitting: Neurology

## 2024-03-03 NOTE — Telephone Encounter (Signed)
 Returned call to [place] an [facility] reviewed ocrevus infusion dispense 20 ml for total of 600 mg ocrevus. (300mg /97ml)

## 2024-03-03 NOTE — Telephone Encounter (Signed)
 [facility] (Jennifer)Checking on directions for ocrelizumab (OCREVUS) 300 MG/10ML injection. Does not match the patients history. Reference no: [id number]

## 2024-03-05 ENCOUNTER — Ambulatory Visit: Payer: Medicare Other

## 2024-03-05 VITALS — BP 146/83 | Ht 66.5 in | Wt 289.0 lb

## 2024-03-05 DIAGNOSIS — Z0000 Encounter for general adult medical examination without abnormal findings: Secondary | ICD-10-CM | POA: Diagnosis not present

## 2024-03-05 DIAGNOSIS — Z2821 Immunization not carried out because of patient refusal: Secondary | ICD-10-CM

## 2024-03-05 DIAGNOSIS — Z5320 Procedure and treatment not carried out because of patient's decision for unspecified reasons: Secondary | ICD-10-CM

## 2024-03-05 NOTE — Patient Instructions (Signed)
 Ms. Thackston , Thank you for taking time to come for your Medicare Wellness Visit. I appreciate your ongoing commitment to your health goals. Please review the following plan we discussed and let me know if I can assist you in the future.   Referrals/Orders/Follow-Ups/Clinician Recommendations:Patient was advised of the vaccinations that were due. Patient states she will get her Mammogram done.  This is a list of the screening recommended for you and due dates:  Health Maintenance  Topic Date Due   Zoster (Shingles) Vaccine (1 of 2) Never done   Pneumococcal Vaccination (2 of 2 - PCV) 08/05/2015   Mammogram  02/16/2023   Yearly kidney health urinalysis for diabetes  07/21/2023   Complete foot exam   04/24/2024   Hemoglobin A1C  05/27/2024   Eye exam for diabetics  07/11/2024   Yearly kidney function blood test for diabetes  11/26/2024   Medicare Annual Wellness Visit  03/05/2025   Pap with HPV screening  03/29/2027   DTaP/Tdap/Td vaccine (2 - Td or Tdap) 09/16/2029   Colon Cancer Screening  06/02/2032   Hepatitis C Screening  Completed   HIV Screening  Completed   HPV Vaccine  Aged Out   Flu Shot  Discontinued   COVID-19 Vaccine  Discontinued    Advanced directives: (Copy Requested) Please bring a copy of your health care power of attorney and living will to the office to be added to your chart at your convenience. You can mail to [facility] [address] [place], [place] [address] or email to [email or web address]  Next Medicare Annual Wellness Visit scheduled for next year: Yes

## 2024-03-05 NOTE — Progress Notes (Signed)
 Because this visit was a virtual/telehealth visit,  certain criteria was not obtained, such a blood pressure, CBG if applicable, and timed get up and go. Any medications not marked as "taking" were not mentioned during the medication reconciliation part of the visit. Any vitals not documented were not able to be obtained due to this being a telehealth visit or patient was unable to self-report a recent blood pressure reading due to a lack of equipment at home via telehealth. Vitals that have been documented are verbally provided by the patient.    Subjective:   Katrina Montes is a 53 y.o. who presents for a Medicare Wellness preventive visit.  Visit Complete: Virtual I connected with  Katrina Montes on 03/05/24 by a audio enabled telemedicine application and verified that I am speaking with the correct person using two identifiers.  Patient Location: Home  Provider Location: Home Office  I discussed the limitations of evaluation and management by telemedicine. The patient expressed understanding and agreed to proceed.  Vital Signs: Because this visit was a virtual/telehealth visit, some criteria may be missing or patient reported. Any vitals not documented were not able to be obtained and vitals that have been documented are patient reported.  VideoDeclined- This patient declined Librarian, academic. Therefore the visit was completed with audio only.  Persons Participating in Visit: Patient.  AWV Questionnaire: No: Patient Medicare AWV questionnaire was not completed prior to this visit.        Objective:    Today's Vitals   03/05/24 0809  BP: (S) (!) 146/83  Weight: 289 lb (131.1 kg)  Height: 5' 6.5" (1.689 m)   Body mass index is 45.95 kg/m.     03/05/2024    8:07 AM 03/05/2023    8:19 AM 06/02/2022    8:05 AM 12/23/2021    4:12 PM 09/17/2019   10:44 AM 01/01/2015    5:35 PM  Advanced Directives  Does Patient Have a Medical Advance  Directive? No No No No No No  Would patient like information on creating a medical advance directive? No - Patient declined No - Patient declined No - Patient declined No - Patient declined Yes (MAU/Ambulatory/Procedural Areas - Information given) No - patient declined information    Current Medications (verified) Outpatient Encounter Medications as of 03/05/2024  Medication Sig   acetaminophen-codeine (TYLENOL #3) 300-30 MG tablet Take 1 tablet by mouth every 6 (six) hours as needed.   amoxicillin-clavulanate (AUGMENTIN) 875-125 MG tablet Take 1 tablet by mouth every 12 (twelve) hours.   APPLE CIDER VINEGAR PO Take 1 tablet by mouth daily.   atorvastatin (LIPITOR) 40 MG tablet Take 1 tablet (40 mg total) by mouth daily.   celecoxib (CELEBREX) 100 MG capsule Take 1 capsule (100 mg total) by mouth 2 (two) times daily as needed for up to 420 doses.   Continuous Glucose Receiver (FREESTYLE LIBRE 3 READER) DEVI Use to test BG 4+ times daily. E11.65   Continuous Glucose Sensor (FREESTYLE LIBRE 3 SENSOR) MISC CHANGE SENSOR EVERY 14 DAYS   cyclobenzaprine (FLEXERIL) 10 MG tablet Take 1 tablet (10 mg total) by mouth 2 (two) times daily as needed for muscle spasms.   diclofenac Sodium (VOLTAREN ARTHRITIS PAIN) 1 % GEL 2 gram qid prn   diphenhydrAMINE (BENADRYL) 25 MG tablet Take 25 mg by mouth every 6 (six) hours as needed for allergies.   docusate sodium (COLACE) 50 MG capsule Take 1 capsule (50 mg total) by mouth 2 (two)  times daily. (Patient taking differently: Take 50 mg by mouth daily as needed for moderate constipation.)   DULoxetine (CYMBALTA) 60 MG capsule Take 1 capsule (60 mg total) by mouth daily.   ELDERBERRY PO Take 1,150 mg by mouth daily.   Erenumab-aooe (AIMOVIG) 70 MG/ML SOAJ Inject 70 mg into the skin every 30 (thirty) days.   ezetimibe (ZETIA) 10 MG tablet Take 1 tablet (10 mg total) by mouth daily.   ferrous sulfate (EQL SLOW RELEASE IRON) 160 (50 Fe) MG TBCR SR tablet Take 1 tablet  (160 mg total) by mouth daily.   fluticasone (FLONASE) 50 MCG/ACT nasal spray Place 2 sprays into both nostrils daily.   gabapentin (NEURONTIN) 300 MG capsule Take 1 capsule (300 mg total) by mouth 3 (three) times daily.   ibuprofen (ADVIL,MOTRIN) 200 MG tablet Take 200 mg by mouth as needed for headache, moderate pain or mild pain.   insulin degludec (TRESIBA FLEXTOUCH) 200 UNIT/ML FlexTouch Pen Inject 20 Units into the skin at bedtime.   Insulin Pen Needle (BD PEN NEEDLE NANO U/F) 32G X 4 MM MISC 1 each by Does not apply route 4 (four) times daily.   lisinopril (ZESTRIL) 20 MG tablet TAKE 1 TABLET(20 MG) BY MOUTH DAILY   meclizine (ANTIVERT) 25 MG tablet Take 1 tablet (25 mg total) by mouth 3 (three) times daily as needed for dizziness.   metFORMIN (GLUCOPHAGE) 500 MG tablet TAKE 1 TABLET(500 MG) BY MOUTH TWICE DAILY WITH A MEAL   Multiple Vitamin (MULTIVITAMIN) capsule Take 1 capsule by mouth daily.   mupirocin ointment (BACTROBAN) 2 % Apply 1 Application topically 2 (two) times daily.   ocrelizumab (OCREVUS) 300 MG/10ML injection Inject 10 mLs (300 mg total) into the vein every 6 (six) months.   polyethylene glycol powder (GLYCOLAX/MIRALAX) 17 GM/SCOOP powder Take 1 capful (17 g) in water by mouth daily. (Patient taking differently: Take 17 g by mouth daily as needed for moderate constipation.)   polyethylene glycol-electrolytes (NULYTELY) 420 g solution As directed   PRESCRIPTION MEDICATION Place 1 drop into both eyes at bedtime.   promethazine-dextromethorphan (PROMETHAZINE-DM) 6.25-15 MG/5ML syrup Take 5 mLs by mouth 4 (four) times daily as needed.   rizatriptan (MAXALT) 10 MG tablet Take 1 tablet (10 mg total) by mouth as needed for migraine. May repeat in 2 hours if needed. Max dose: 2/24 hr or 15/30 days.   tirzepatide [facility]) 2.5 MG/0.5ML Pen Inject 2.5 mg into the skin once a week.   traZODone (DESYREL) 50 MG tablet Take 2 tablets (100 mg total) by mouth at bedtime.   triamcinolone  cream (KENALOG) 0.1 % Apply 1 Application topically 2 (two) times daily.   Vitamin D, Ergocalciferol, (DRISDOL) 1.25 MG (50000 UNIT) CAPS capsule TAKE ONE CAPSULE BY MOUTH EVERY 7 DAYS   No facility-administered encounter medications on file as of 03/05/2024.    Allergies (verified) Patient has no known allergies.   History: Past Medical History:  Diagnosis Date   Anemia    Anxiety    Back pain    Carpal tunnel syndrome    Constipation    Depression    Diabetes mellitus    Diabetes mellitus, type II (HCC)    Hyperlipidemia    Hypertension    Joint pain    Lactose intolerance    Multiple sclerosis (HCC) 2016   Optic neuritis    Shortness of breath    Sleep apnea    Swallowing difficulty    Swelling of both lower extremities  Vision, loss, sudden    Vitamin D deficiency    Past Surgical History:  Procedure Laterality Date   BREAST REDUCTION SURGERY     COLONOSCOPY WITH PROPOFOL N/A 06/02/2022   Procedure: COLONOSCOPY WITH PROPOFOL;  Surgeon: Corbin Ade, MD;  Location: [facility];  Service: Endoscopy;  Laterality: N/A;  8:45AM   CYST EXCISION     INCISION AND DRAINAGE ABSCESS Left 12/23/2021   Procedure: INCISION AND DRAINAGE BUTTOCK ABSCESS;  Surgeon: Almond Lint, MD;  Location: [facility] OR;  Service: General;  Laterality: Left;   MOUTH SURGERY  09/2023   ovarian cyst removed     TUBAL LIGATION     WISDOM TOOTH EXTRACTION     Family History  Problem Relation Age of Onset   Cancer Mother    Diabetes Mother    Hearing loss Mother    Thyroid nodules Mother    Anemia Mother    Kidney disease Mother    Hypertension Mother    Obesity Mother    Breast cancer Mother    Hyperlipidemia Father    High blood pressure Father    Sleep apnea Father    Pancreatic cancer Father    Anemia Sister    Anemia Sister    Arthritis Maternal Grandmother    Arthritis Maternal Grandfather    Arthritis Paternal Grandmother    Kidney disease Paternal Grandmother     Hypertension Paternal Grandmother    Arthritis Paternal Grandfather    Vision loss Paternal Grandfather    Stroke Paternal Grandfather    Hyperlipidemia Paternal Grandfather    Pancreatic cancer Maternal Uncle    Kidney disease Paternal Uncle    Colon cancer Neg Hx    Colon polyps Neg Hx    Social History   Socioeconomic History   Marital status: Single    Spouse name: Not on file   Number of children: 0   Years of education: College   Highest education level: Bachelor's degree (e.g., BA, AB, BS)  Occupational History   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   Right handed.   Social Drivers of Health   Financial Resource Strain: Medium Risk (03/05/2024)   Overall Financial Resource Strain (CARDIA)    Difficulty of Paying Living Expenses: Somewhat hard  Food Insecurity: Food Insecurity Present (03/05/2024)   Hunger Vital Sign    Worried About Running Out of Food in the Last Year: Sometimes true    Ran Out of Food in the Last Year: Sometimes true  Transportation Needs: No Transportation Needs (03/05/2024)   PRAPARE - Administrator, Civil Service (Medical): No    Lack of Transportation (Non-Medical): No  Physical Activity: Insufficiently Active (03/05/2024)   Exercise Vital Sign    Days of Exercise per Week: 2 days    Minutes of Exercise per Session: 20 min  Stress: No Stress Concern Present (03/05/2024)   [employer] of Occupational Health - Occupational Stress Questionnaire    Feeling of Stress : Only a little  Social Connections: Moderately Integrated (03/05/2024)   Social Connection and Isolation Panel [NHANES]    Frequency of Communication with Friends and  Family: More  than three times a week    Frequency of Social Gatherings with Friends and Family: Three times a week    Attends Religious Services: More than 4 times per year    Active Member of Clubs or Organizations: Yes    Attends Engineer, structural: More than 4 times per year    Marital Status: Never married    Tobacco Counseling Counseling given: Not Answered    Clinical Intake:  Pre-visit preparation completed: Yes  Pain : No/denies pain     BMI - recorded: 45.95 Nutritional Status: BMI > 30  Obese Nutritional Risks: None Diabetes: Yes CBG done?: No Did pt. bring in CBG monitor from home?: No  Lab Results  Component Value Date   HGBA1C 9.4 (H) 11/27/2023   HGBA1C 9.8 (A) 08/28/2023   HGBA1C 8.7 (H) 04/25/2023     How often do you need to have someone help you when you read instructions, pamphlets, or other written materials from your doctor or pharmacy?: 1 - Never  Interpreter Needed?: No  Information entered by :: Dorsie Sethi,cma   Activities of Daily Living     03/04/2024    8:45 AM  In your present state of health, do you have any difficulty performing the following activities:  Hearing? 1  Vision? 1  Difficulty concentrating or making decisions? 1  Walking or climbing stairs? 1  Dressing or bathing? 0  Doing errands, shopping? 0  Preparing Food and eating ? N  Using the Toilet? N  In the past six months, have you accidently leaked urine? Y  Do you have problems with loss of bowel control? Y  Managing your Medications? N  Managing your Finances? N  Housekeeping or managing your Housekeeping? Y    Patient Care Team: Gilmore Laroche, FNP as PCP - General (Family Medicine) Levert Feinstein, MD as Consulting Physician (Neurology) Roma Kayser, MD as Consulting Physician (Endocrinology) Daisy Lazar, DO (Optometry) Glean Salvo, NP as Nurse Practitioner (Neurology) Loretta Plume, MD as Consulting Physician (Plastic  Surgery)  Indicate any recent Medical Services you may have received from other than [facility] providers in the past year (date may be approximate).     Assessment:   This is a routine wellness examination for Dale.  Hearing/Vision screen Hearing Screening - Comments:: Patient has some hearing loss Dr Kerry Hough in [place] [facility] [address] Vision Screening - Comments:: Patient see Dr care at [facility] in [place]   Goals Addressed             This Visit's Progress    Patient Stated   On track    Weight loss,resolve arm pain.       Depression Screen     03/05/2024    8:20 AM 07/26/2023    9:56 AM 04/25/2023    3:25 PM 03/05/2023    8:19 AM 03/05/2023    8:18 AM 01/24/2023    3:15 PM 09/12/2022    8:34 AM  PHQ 2/9 Scores  PHQ - 2 Score 2 0 0 1 1 3 1   PHQ- 9 Score 7 0 0 1 1 9 6     Fall Risk     03/04/2024    8:45 AM 07/26/2023    9:55 AM 04/25/2023    3:25 PM 04/25/2023    3:24 PM 03/05/2023    8:19 AM  Fall Risk   Falls in the past year? 1 0 1 0 1  Number falls in past yr:  1 0 1 0 0  Injury with Fall? 0 0 0 0 1  Risk for fall due to : History of fall(s) No Fall Risks History of fall(s);No Fall Risks No Fall Risks Impaired balance/gait  Follow up Falls evaluation completed Falls evaluation completed Falls evaluation completed Falls evaluation completed Falls evaluation completed    MEDICARE RISK AT HOME:  Medicare Risk at Home Any stairs in or around the home?: (Patient-Rptd) Yes If so, are there any without handrails?: (Patient-Rptd) No Home free of loose throw rugs in walkways, pet beds, electrical cords, etc?: (Patient-Rptd) Yes Adequate lighting in your home to reduce risk of falls?: (Patient-Rptd) Yes Life alert?: (Patient-Rptd) No Use of a cane, walker or w/c?: (Patient-Rptd) Yes Grab bars in the bathroom?: (Patient-Rptd) Yes Shower chair or bench in shower?: (Patient-Rptd) No Elevated toilet seat or a handicapped toilet?: (Patient-Rptd) No  TIMED UP AND GO:  Was the  test performed?  No  Cognitive Function: 6CIT completed    03/05/2023    8:20 AM  MMSE - Mini Mental State Exam  Not completed: Unable to complete        03/05/2024    8:14 AM 03/05/2023    8:23 AM 02/21/2022    9:06 AM  6CIT Screen  What Year? 0 points 0 points 0 points  What month? 0 points 0 points 0 points  What time? 0 points 0 points 0 points  Count back from 20 0 points 0 points 0 points  Months in reverse 0 points 0 points 0 points  Repeat phrase 0 points 0 points 4 points  Total Score 0 points 0 points 4 points    Immunizations Immunization History  Administered Date(s) Administered   Influenza Whole 10/24/2010   Influenza, Seasonal, Injecte, Preservative Fre 11/27/2023   Influenza,inj,Quad PF,6+ Mos 09/01/2014, 03/14/2019, 09/17/2019, 12/02/2020, 01/27/2022, 10/11/2022   Moderna Covid-19 Vaccine Bivalent Booster 46yrs & up 07/21/2021   Moderna SARS-COV2 Booster Vaccination 11/16/2020   Moderna Sars-Covid-2 Vaccination 02/09/2020, 03/09/2020   Pneumococcal Polysaccharide-23 08/04/2014   Tdap 09/17/2019    Screening Tests Health Maintenance  Topic Date Due   Zoster Vaccines- Shingrix (1 of 2) Never done   Pneumococcal Vaccine 73-71 Years old (2 of 2 - PCV) 08/05/2015   MAMMOGRAM  02/16/2023   Diabetic kidney evaluation - Urine ACR  07/21/2023   FOOT EXAM  04/24/2024   HEMOGLOBIN A1C  05/27/2024   OPHTHALMOLOGY EXAM  07/11/2024   Diabetic kidney evaluation - eGFR measurement  11/26/2024   Medicare Annual Wellness (AWV)  03/05/2025   Cervical Cancer Screening (HPV/Pap Cotest)  03/29/2027   DTaP/Tdap/Td (2 - Td or Tdap) 09/16/2029   Colonoscopy  06/02/2032   Hepatitis C Screening  Completed   HIV Screening  Completed   HPV VACCINES  Aged Out   INFLUENZA VACCINE  Discontinued   COVID-19 Vaccine  Discontinued    Health Maintenance  Health Maintenance Due  Topic Date Due   Zoster Vaccines- Shingrix (1 of 2) Never done   Pneumococcal Vaccine 19-52 Years  old (2 of 2 - PCV) 08/05/2015   MAMMOGRAM  02/16/2023   Diabetic kidney evaluation - Urine ACR  07/21/2023   Health Maintenance Items Addressed:patient was advised to get a Mammogram, pneumonia vaccine and diabetic kidney evaluation  Additional Screening:  Vision Screening: Recommended annual ophthalmology exams for early detection of glaucoma and other disorders of the eye.  Dental Screening: Recommended annual dental exams for proper oral hygiene  Community Resource Referral / Chronic Care  Management: CRR required this visit?  No   CCM required this visit?  No     Plan:     I have personally reviewed and noted the following in the patient's chart:   Medical and social history Use of alcohol, tobacco or illicit drugs  Current medications and supplements including opioid prescriptions. Patient is not currently taking opioid prescriptions. Functional ability and status Nutritional status Physical activity Advanced directives List of other physicians Hospitalizations, surgeries, and ER visits in previous 12 months Vitals Screenings to include cognitive, depression, and falls Referrals and appointments  In addition, I have reviewed and discussed with patient certain preventive protocols, quality metrics, and best practice recommendations. A written personalized care plan for preventive services as well as general preventive health recommendations were provided to patient.     Rudi Heap, [place]   03/05/2024   After Visit Summary: (MyChart) Due to this being a telephonic visit, the after visit summary with patients personalized plan was offered to patient via MyChart   Notes: Nothing significant to report at this time.

## 2024-04-01 ENCOUNTER — Ambulatory Visit: Payer: Medicare Other | Admitting: Family Medicine

## 2024-04-02 ENCOUNTER — Ambulatory Visit: Payer: Medicare Other | Admitting: "Endocrinology

## 2024-04-23 ENCOUNTER — Telehealth: Payer: Self-pay | Admitting: Neurology

## 2024-04-23 DIAGNOSIS — G629 Polyneuropathy, unspecified: Secondary | ICD-10-CM

## 2024-04-23 NOTE — Telephone Encounter (Signed)
 I refilled gabapentin  for 3 months at 300 mg TID.  She needs an office visit. Please schedule her at my next available, or she can see Dr. Gracie Lav if she prefers. Thanks  Meds ordered this encounter  Medications   gabapentin  (NEURONTIN ) 300 MG capsule    Sig: TAKE 1 CAPSULE(300 MG) BY MOUTH THREE TIMES DAILY    Dispense:  90 capsule    Refill:  3

## 2024-04-23 NOTE — Telephone Encounter (Signed)
 Left message for patient to call to relay the below and I held a slot on 05/14/24 @ 9:45 with Katrina Montes for patient to be seen.

## 2024-04-23 NOTE — Telephone Encounter (Signed)
 I think the script needs changing to your last note:  Increase gabapentin  300 mg twice daily, start taking 600 mg at bedtime

## 2024-04-25 LAB — LIPID PANEL
Chol/HDL Ratio: 2.5 ratio (ref 0.0–4.4)
Cholesterol, Total: 197 mg/dL (ref 100–199)
HDL: 79 mg/dL (ref >39)
LDL Chol Calc (NIH): 106 mg/dL — ABNORMAL HIGH (ref 0–99)
Triglycerides: 67 mg/dL (ref 0–149)
VLDL Cholesterol Cal: 12 mg/dL (ref 5–40)

## 2024-04-25 LAB — CBC WITH DIFFERENTIAL/PLATELET
Basophils Absolute: 0 10*3/uL (ref 0.0–0.2)
Basos: 0 %
EOS (ABSOLUTE): 0 10*3/uL (ref 0.0–0.4)
Eos: 1 %
Hematocrit: 34.2 % (ref 34.0–46.6)
Hemoglobin: 11 g/dL — ABNORMAL LOW (ref 11.1–15.9)
Immature Grans (Abs): 0 10*3/uL (ref 0.0–0.1)
Immature Granulocytes: 0 %
Lymphocytes Absolute: 1.4 10*3/uL (ref 0.7–3.1)
Lymphs: 23 %
MCH: 26 pg — ABNORMAL LOW (ref 26.6–33.0)
MCHC: 32.2 g/dL (ref 31.5–35.7)
MCV: 81 fL (ref 79–97)
Monocytes Absolute: 0.7 10*3/uL (ref 0.1–0.9)
Monocytes: 11 %
Neutrophils Absolute: 3.9 10*3/uL (ref 1.4–7.0)
Neutrophils: 65 %
Platelets: 318 10*3/uL (ref 150–450)
RBC: 4.23 x10E6/uL (ref 3.77–5.28)
RDW: 13.5 % (ref 11.7–15.4)
WBC: 6 10*3/uL (ref 3.4–10.8)

## 2024-04-25 LAB — HEMOGLOBIN A1C
Est. average glucose Bld gHb Est-mCnc: 255 mg/dL
Hgb A1c MFr Bld: 10.5 % — ABNORMAL HIGH (ref 4.8–5.6)

## 2024-04-25 LAB — TSH+FREE T4
Free T4: 0.79 ng/dL — ABNORMAL LOW (ref 0.82–1.77)
TSH: 1.86 u[IU]/mL (ref 0.450–4.500)

## 2024-04-25 LAB — CMP14+EGFR
ALT: 15 IU/L (ref 0–32)
AST: 11 IU/L (ref 0–40)
Albumin: 4 g/dL (ref 3.8–4.9)
Alkaline Phosphatase: 102 IU/L (ref 44–121)
BUN/Creatinine Ratio: 10 (ref 9–23)
BUN: 7 mg/dL (ref 6–24)
Bilirubin Total: 0.2 mg/dL (ref 0.0–1.2)
CO2: 21 mmol/L (ref 20–29)
Calcium: 8.8 mg/dL (ref 8.7–10.2)
Chloride: 102 mmol/L (ref 96–106)
Creatinine, Ser: 0.71 mg/dL (ref 0.57–1.00)
Globulin, Total: 2.9 g/dL (ref 1.5–4.5)
Glucose: 281 mg/dL — ABNORMAL HIGH (ref 70–99)
Potassium: 5 mmol/L (ref 3.5–5.2)
Sodium: 137 mmol/L (ref 134–144)
Total Protein: 6.9 g/dL (ref 6.0–8.5)
eGFR: 103 mL/min/{1.73_m2} (ref >59)

## 2024-04-25 LAB — VITAMIN D 25 HYDROXY (VIT D DEFICIENCY, FRACTURES): Vit D, 25-Hydroxy: 15.1 ng/mL — ABNORMAL LOW (ref 30.0–100.0)

## 2024-04-25 LAB — MICROALBUMIN / CREATININE URINE RATIO
Creatinine, Urine: 184.4 mg/dL
Microalb/Creat Ratio: 5 mg/g{creat} (ref 0–29)
Microalbumin, Urine: 8.5 ug/mL

## 2024-04-29 ENCOUNTER — Ambulatory Visit: Admitting: "Endocrinology

## 2024-04-30 ENCOUNTER — Ambulatory Visit: Admitting: "Endocrinology

## 2024-04-30 ENCOUNTER — Encounter: Payer: Self-pay | Admitting: "Endocrinology

## 2024-04-30 ENCOUNTER — Other Ambulatory Visit: Payer: Self-pay

## 2024-04-30 VITALS — BP 136/84 | HR 88 | Ht 66.5 in | Wt 298.0 lb

## 2024-04-30 DIAGNOSIS — E1165 Type 2 diabetes mellitus with hyperglycemia: Secondary | ICD-10-CM | POA: Diagnosis not present

## 2024-04-30 DIAGNOSIS — Z6841 Body Mass Index (BMI) 40.0 and over, adult: Secondary | ICD-10-CM

## 2024-04-30 DIAGNOSIS — Z7984 Long term (current) use of oral hypoglycemic drugs: Secondary | ICD-10-CM | POA: Diagnosis not present

## 2024-04-30 DIAGNOSIS — E559 Vitamin D deficiency, unspecified: Secondary | ICD-10-CM

## 2024-04-30 DIAGNOSIS — I10 Essential (primary) hypertension: Secondary | ICD-10-CM | POA: Diagnosis not present

## 2024-04-30 DIAGNOSIS — E039 Hypothyroidism, unspecified: Secondary | ICD-10-CM | POA: Diagnosis not present

## 2024-04-30 DIAGNOSIS — E782 Mixed hyperlipidemia: Secondary | ICD-10-CM

## 2024-04-30 DIAGNOSIS — Z794 Long term (current) use of insulin: Secondary | ICD-10-CM | POA: Diagnosis not present

## 2024-04-30 DIAGNOSIS — Z7985 Long-term (current) use of injectable non-insulin antidiabetic drugs: Secondary | ICD-10-CM | POA: Diagnosis not present

## 2024-04-30 MED ORDER — FREESTYLE LIBRE 3 PLUS SENSOR MISC: 2 refills | Status: DC

## 2024-04-30 MED ORDER — TRESIBA FLEXTOUCH 200 UNIT/ML ~~LOC~~ SOPN: 40.0000 [IU] | PEN_INJECTOR | Freq: Every day | SUBCUTANEOUS | 2 refills | Status: DC

## 2024-04-30 MED ORDER — LEVOTHYROXINE SODIUM 50 MCG PO TABS
50.0000 ug | ORAL_TABLET | Freq: Every day | ORAL | 1 refills | Status: DC
Start: 2024-04-30 — End: 2024-08-05

## 2024-04-30 MED ORDER — TRULICITY 1.5 MG/0.5ML ~~LOC~~ SOAJ: 1.5000 mg | SUBCUTANEOUS | 1 refills | Status: DC

## 2024-04-30 MED ORDER — VITAMIN D3 125 MCG (5000 UT) PO CAPS: 5000.0000 [IU] | ORAL_CAPSULE | Freq: Every day | ORAL | 3 refills | Status: AC

## 2024-04-30 NOTE — Progress Notes (Signed)
 04/30/2024, 2:15 PM   Endocrinology follow-up note  Subjective:    Patient ID: Katrina Montes, female    DOB: 01/31/1971.  Cristine Rickey Postle is being seen in consultation for management of currently uncontrolled symptomatic diabetes requested by  Zarwolo, Gloria, FNP.   Past Medical History:  Diagnosis Date   Anemia    Anxiety    Back pain    Carpal tunnel syndrome    Constipation    Depression    Diabetes mellitus    Diabetes mellitus, type II (HCC)    Hyperlipidemia    Hypertension    Joint pain    Lactose intolerance    Multiple sclerosis (HCC) 2016   Optic neuritis    Shortness of breath    Sleep apnea    Swallowing difficulty    Swelling of both lower extremities    Vision, loss, sudden    Vitamin D  deficiency     Past Surgical History:  Procedure Laterality Date   BREAST REDUCTION SURGERY     COLONOSCOPY WITH PROPOFOL  N/A 06/02/2022   Procedure: COLONOSCOPY WITH PROPOFOL ;  Surgeon: Suzette Espy, MD;  Location: [facility];  Service: Endoscopy;  Laterality: N/A;  8:45AM   CYST EXCISION     INCISION AND DRAINAGE ABSCESS Left 12/23/2021   Procedure: INCISION AND DRAINAGE BUTTOCK ABSCESS;  Surgeon: Lockie Rima, MD;  Location: [facility] OR;  Service: General;  Laterality: Left;   MOUTH SURGERY  09/2023   ovarian cyst removed     TUBAL LIGATION     WISDOM TOOTH EXTRACTION      Social History   Socioeconomic History   Marital status: Single    Spouse name: Not on file   Number of children: 0   Years of education: College   Highest education level: Bachelor's degree (e.g., BA, AB, BS)  Occupational History   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   Right handed.   Social Drivers of Health   Financial Resource Strain: Medium Risk (03/05/2024)   Overall Financial Resource Strain (CARDIA)    Difficulty of Paying Living Expenses: Somewhat hard  Food Insecurity: Food Insecurity Present (03/05/2024)   Hunger Vital Sign    Worried About Running Out of Food in the Last Year: Sometimes true    Ran Out of Food in the Last Year: Sometimes true  Transportation Needs: No Transportation Needs (03/05/2024)   PRAPARE - Administrator, Civil Service (Medical): No    Lack of Transportation (Non-Medical): No  Physical Activity: Insufficiently Active (03/05/2024)   Exercise Vital Sign    Days of Exercise per Week: 2 days    Minutes of Exercise per Session: 20 min  Stress: No Stress Concern Present (03/05/2024)   [employer] of  Occupational Health - Occupational Stress Questionnaire    Feeling of Stress : Only a little  Social Connections: Moderately Integrated (03/05/2024)   Social Connection and Isolation Panel [NHANES]    Frequency of Communication with Friends and Family: More than three times a week    Frequency of Social Gatherings with Friends and Family: Three times a week    Attends Religious Services: More than 4 times per year    Active Member of Clubs or Organizations: Yes    Attends Engineer, structural: More than 4 times per year    Marital Status: Never married    Family History  Problem Relation Age of Onset   Cancer Mother    Diabetes Mother    Hearing loss Mother    Thyroid  nodules Mother    Anemia Mother    Kidney disease Mother    Hypertension Mother    Obesity Mother    Breast cancer Mother    Hyperlipidemia Father    High blood pressure Father    Sleep apnea Father    Pancreatic cancer Father    Anemia Sister    Anemia Sister    Arthritis Maternal Grandmother    Arthritis Maternal Grandfather    Arthritis Paternal  Grandmother    Kidney disease Paternal Grandmother    Hypertension Paternal Grandmother    Arthritis Paternal Grandfather    Vision loss Paternal Grandfather    Stroke Paternal Grandfather    Hyperlipidemia Paternal Grandfather    Pancreatic cancer Maternal Uncle    Kidney disease Paternal Uncle    Colon cancer Neg Hx    Colon polyps Neg Hx     Outpatient Encounter Medications as of 04/30/2024  Medication Sig   Cholecalciferol (VITAMIN D3) 125 MCG (5000 UT) CAPS Take 1 capsule (5,000 Units total) by mouth daily with lunch.   levothyroxine (SYNTHROID) 50 MCG tablet Take 1 tablet (50 mcg total) by mouth daily before breakfast.   acetaminophen -codeine (TYLENOL  #3) 300-30 MG tablet Take 1 tablet by mouth every 6 (six) hours as needed.   APPLE CIDER VINEGAR PO Take 1 tablet by mouth daily.   atorvastatin  (LIPITOR) 40 MG tablet Take 1 tablet (40 mg total) by mouth daily.   celecoxib  (CELEBREX ) 100 MG capsule Take 1 capsule (100 mg total) by mouth 2 (two) times daily as needed for up to 420 doses.   Continuous Glucose Receiver (FREESTYLE LIBRE 3 READER) DEVI Use to test BG 4+ times daily. E11.65   Continuous Glucose Sensor (FREESTYLE LIBRE 3 SENSOR) MISC CHANGE SENSOR EVERY 14 DAYS   cyclobenzaprine  (FLEXERIL ) 10 MG tablet Take 1 tablet (10 mg total) by mouth 2 (two) times daily as needed for muscle spasms.   diclofenac  Sodium (VOLTAREN  ARTHRITIS PAIN) 1 % GEL 2 gram qid prn   diphenhydrAMINE  (BENADRYL ) 25 MG tablet Take 25 mg by mouth every 6 (six) hours as needed for allergies.   docusate sodium  (COLACE) 50 MG capsule Take 1 capsule (50 mg total) by mouth 2 (two) times daily. (Patient taking differently: Take 50 mg by mouth daily as needed for moderate constipation.)   DULoxetine  (CYMBALTA ) 60 MG capsule Take 1 capsule (60 mg total) by mouth daily.   ELDERBERRY PO Take 1,150 mg by mouth daily.   Erenumab -aooe (AIMOVIG ) 70 MG/ML SOAJ Inject 70 mg into the skin every 30 (thirty) days.    ezetimibe  (ZETIA ) 10 MG tablet Take 1 tablet (10 mg total) by mouth daily.   ferrous sulfate  (EQL SLOW RELEASE IRON )  160 (50 Fe) MG TBCR SR tablet Take 1 tablet (160 mg total) by mouth daily.   fluticasone  (FLONASE ) 50 MCG/ACT nasal spray Place 2 sprays into both nostrils daily.   gabapentin  (NEURONTIN ) 300 MG capsule TAKE 1 CAPSULE(300 MG) BY MOUTH THREE TIMES DAILY   ibuprofen (ADVIL,MOTRIN) 200 MG tablet Take 200 mg by mouth as needed for headache, moderate pain or mild pain.   insulin  degludec (TRESIBA  FLEXTOUCH) 200 UNIT/ML FlexTouch Pen Inject 40 Units into the skin at bedtime.   Insulin  Pen Needle (BD PEN NEEDLE NANO U/F) 32G X 4 MM MISC 1 each by Does not apply route 4 (four) times daily.   lisinopril  (ZESTRIL ) 20 MG tablet TAKE 1 TABLET(20 MG) BY MOUTH DAILY   meclizine  (ANTIVERT ) 25 MG tablet Take 1 tablet (25 mg total) by mouth 3 (three) times daily as needed for dizziness.   metFORMIN  (GLUCOPHAGE ) 500 MG tablet TAKE 1 TABLET(500 MG) BY MOUTH TWICE DAILY WITH A MEAL   Multiple Vitamin (MULTIVITAMIN) capsule Take 1 capsule by mouth daily.   mupirocin  ointment (BACTROBAN ) 2 % Apply 1 Application topically 2 (two) times daily.   ocrelizumab  (OCREVUS ) 300 MG/10ML injection Inject 10 mLs (300 mg total) into the vein every 6 (six) months.   polyethylene glycol powder (GLYCOLAX /MIRALAX ) 17 GM/SCOOP powder Take 1 capful (17 g) in water by mouth daily. (Patient taking differently: Take 17 g by mouth daily as needed for moderate constipation.)   polyethylene glycol-electrolytes (NULYTELY ) 420 g solution As directed   PRESCRIPTION MEDICATION Place 1 drop into both eyes at bedtime.   promethazine -dextromethorphan (PROMETHAZINE -DM) 6.25-15 MG/5ML syrup Take 5 mLs by mouth 4 (four) times daily as needed.   rizatriptan  (MAXALT ) 10 MG tablet Take 1 tablet (10 mg total) by mouth as needed for migraine. May repeat in 2 hours if needed. Max dose: 2/24 hr or 15/30 days.   tirzepatide (MOUNJARO) 2.5 MG/0.5ML  Pen Inject 2.5 mg into the skin once a week.   traZODone  (DESYREL ) 50 MG tablet Take 2 tablets (100 mg total) by mouth at bedtime.   triamcinolone  cream (KENALOG ) 0.1 % Apply 1 Application topically 2 (two) times daily.   Vitamin D , Ergocalciferol , (DRISDOL ) 1.25 MG (50000 UNIT) CAPS capsule TAKE ONE CAPSULE BY MOUTH EVERY 7 DAYS   [DISCONTINUED] amoxicillin -clavulanate (AUGMENTIN ) 875-125 MG tablet Take 1 tablet by mouth every 12 (twelve) hours.   [DISCONTINUED] insulin  degludec (TRESIBA  FLEXTOUCH) 200 UNIT/ML FlexTouch Pen Inject 20 Units into the skin at bedtime.   No facility-administered encounter medications on file as of 04/30/2024.    ALLERGIES: No Known Allergies  VACCINATION STATUS: Immunization History  Administered Date(s) Administered   Influenza Whole 10/24/2010   Influenza, Seasonal, Injecte, Preservative Fre 11/27/2023   Influenza,inj,Quad PF,6+ Mos 09/01/2014, 03/14/2019, 09/17/2019, 12/02/2020, 01/27/2022, 10/11/2022   Moderna Covid-19 Vaccine Bivalent Booster 44yrs & up 07/21/2021   Moderna SARS-COV2 Booster Vaccination 11/16/2020   Moderna Sars-Covid-2 Vaccination 02/09/2020, 03/09/2020   Pneumococcal Polysaccharide-23 08/04/2014   Tdap 09/17/2019    Diabetes She presents for her follow-up diabetic visit. She has type 2 diabetes mellitus. Onset time: She was diagnosed at approximate age of 83 years. Her disease course has been worsening. There are no hypoglycemic associated symptoms. Pertinent negatives for hypoglycemia include no confusion, headaches, pallor or seizures. Pertinent negatives for diabetes include no chest pain, no fatigue, no polydipsia, no polyphagia and no polyuria. There are no hypoglycemic complications. Symptoms are worsening (More recently she has shown near target glycemic profile on insulin  treatment.  She  presents with a meter showing average blood glucose of 123 mg per DL). There are no diabetic complications. Risk factors for coronary artery  disease include diabetes mellitus, dyslipidemia, hypertension, obesity and sedentary lifestyle. Current diabetic treatment includes insulin  injections. Her weight is fluctuating minimally. She is following a generally unhealthy diet. When asked about meal planning, she reported none. She has not had a previous visit with a dietitian. She rarely participates in exercise. Her home blood glucose trend is increasing steadily. Her breakfast blood glucose range is generally >200 mg/dl. Her lunch blood glucose range is generally >200 mg/dl. Her dinner blood glucose range is generally >200 mg/dl. Her bedtime blood glucose range is generally >200 mg/dl. Her overall blood glucose range is >200 mg/dl. (Ms. Selva presents with worsening glycemic profile and A1c of 10.5%, increasing from 9.8% during her last presentation.  Her CGM shows average blood glucose of 265, 57% level 1 hyperglycemia 43% level 2 hyperglycemia.  She has no hypoglycemia.  Patient did not call clinic for hyperglycemia despite instructions to do so.      ) An ACE inhibitor/angiotensin II receptor blocker is being taken.  Hypertension This is a chronic problem. The current episode started more than 1 year ago. Pertinent negatives include no chest pain, headaches, palpitations or shortness of breath. Risk factors for coronary artery disease include dyslipidemia, diabetes mellitus, obesity, sedentary lifestyle and smoking/tobacco exposure. Past treatments include ACE inhibitors.  Hyperlipidemia This is a chronic problem. The current episode started more than 1 year ago. Exacerbating diseases include diabetes and obesity. Pertinent negatives include no chest pain, myalgias or shortness of breath. Risk factors for coronary artery disease include diabetes mellitus, dyslipidemia, family history, hypertension, obesity and a sedentary lifestyle.      Objective:       04/30/2024   10:34 AM 03/05/2024    8:09 AM 01/20/2024   12:53 PM  Vitals with BMI   Height 5' 6.5" 5' 6.5"   Weight 298 lbs 289 lbs   BMI 47.38 45.95   Systolic 136 146  146  Diastolic 84 83  83  Pulse 88  161     Significant value    BP 136/84   Pulse 88   Ht 5' 6.5" (1.689 m)   Wt 298 lb (135.2 kg)   BMI 47.38 kg/m   Wt Readings from Last 3 Encounters:  04/30/24 298 lb (135.2 kg)  03/05/24 289 lb (131.1 kg)  11/27/23 289 lb 0.6 oz (131.1 kg)      CMP ( most recent) CMP     Component Value Date/Time   NA 137 04/23/2024 0853   K 5.0 04/23/2024 0853   CL 102 04/23/2024 0853   CO2 21 04/23/2024 0853   GLUCOSE 281 (H) 04/23/2024 0853   GLUCOSE 127 (H) 12/26/2021 0212   BUN 7 04/23/2024 0853   CREATININE 0.71 04/23/2024 0853   CREATININE 0.67 11/19/2020 0931   CALCIUM  8.8 04/23/2024 0853   PROT 6.9 04/23/2024 0853   ALBUMIN 4.0 04/23/2024 0853   AST 11 04/23/2024 0853   ALT 15 04/23/2024 0853   ALKPHOS 102 04/23/2024 0853   BILITOT 0.2 04/23/2024 0853   GFRNONAA >60 12/26/2021 0212   GFRAA 120 07/19/2020 0813     Diabetic Labs (most recent): Lab Results  Component Value Date   HGBA1C 10.5 (H) 04/23/2024   HGBA1C 9.4 (H) 11/27/2023   HGBA1C 9.8 (A) 08/28/2023   MICROALBUR 80 07/20/2022   MICROALBUR 1.1 11/19/2020   MICROALBUR 0.9 05/14/2019  Lipid Panel ( most recent) Lipid Panel     Component Value Date/Time   CHOL 197 04/23/2024 0853   TRIG 67 04/23/2024 0853   HDL 79 04/23/2024 0853   CHOLHDL 2.5 04/23/2024 0853   CHOLHDL 2.5 11/19/2020 0931   VLDL 23 11/02/2014 0944   LDLCALC 106 (H) 04/23/2024 0853   LDLCALC 87 11/19/2020 0931   LABVLDL 12 04/23/2024 0853      Lab Results  Component Value Date   TSH 1.860 04/23/2024   TSH 1.180 11/27/2023   TSH 1.490 08/24/2023   TSH 1.080 04/25/2023   TSH 1.270 01/24/2023   TSH 1.580 09/12/2022   TSH 0.900 01/27/2022   TSH 1.050 10/21/2018   TSH 1.250 02/26/2018   TSH 1.281 07/28/2014   FREET4 0.79 (L) 04/23/2024   FREET4 0.96 11/27/2023   FREET4 1.05 08/24/2023   FREET4  0.80 (L) 04/25/2023   FREET4 0.88 01/24/2023   FREET4 0.94 09/12/2022   FREET4 0.84 01/27/2022       Assessment & Plan:   1. Poorly controlled type 2 diabetes mellitus (HCC)  - Holleigh Sanyla Lamberty has currently uncontrolled symptomatic type 2 DM since  53 years of age.  Ms. Mathies presents with worsening glycemic profile and A1c of 10.5%, increasing from 9.8% during her last presentation.  Her CGM shows average blood glucose of 265, 57% level 1 hyperglycemia 43% level 2 hyperglycemia.  She has no hypoglycemia.  Patient did not call clinic for hyperglycemia despite instructions to do so.     Recent labs reviewed. - I had a long discussion with her about the progressive nature of diabetes and the pathology behind its complications. -her diabetes is complicated by obesity/sedentary life and she remains at a high risk for more acute and chronic complications which include CAD, CVA, CKD, retinopathy, and neuropathy. These are all discussed in detail with her.  - I discussed all available options of managing her diabetes including de-escalation of medications. I have counseled her on diet  and weight management  by adopting a Whole Food , Plant Predominant  ( WFPP) nutrition as recommended by [employer] of Lifestyle Medicine. Patient is encouraged to switch to  unprocessed or minimally processed  complex starch, adequate protein intake (mainly plant source), minimal liquid fat ( mainly vegetable oils), plenty of fruits, and vegetables. -  she is advised to stick to a routine mealtimes to eat 3 complete meals a day and snack only when necessary ( to snack only to correct hypoglycemia BG <70 day time or <100 at night).   - she acknowledges that there is a room for improvement in her food and drink choices. - Suggestion is made for her to avoid simple carbohydrates  from her diet including Cakes, Sweet Desserts, Ice Cream, Soda (diet and regular), Sweet Tea, Candies, Chips, Cookies, Store  Bought Juices, Alcohol , Artificial Sweeteners,  Coffee Creamer, and "Sugar-free" Products, Lemonade. This will help patient to have more stable blood glucose profile and potentially avoid unintended weight gain.  The following Lifestyle Medicine recommendations according to American College of Lifestyle Medicine  [facility]) were discussed and and offered to patient and she  agrees to start the journey:  A. Whole Foods, Plant-Based Nutrition comprising of fruits and vegetables, plant-based proteins, whole-grain carbohydrates was discussed in detail with the patient.   A list for source of those nutrients were also provided to the patient.  Patient will use only water or unsweetened tea for hydration. B.  The need  to stay away from risky substances including alcohol, smoking; obtaining 7 to 9 hours of restorative sleep, at least 150 minutes of moderate intensity exercise weekly, the importance of healthy social connections,  and stress management techniques were discussed. C.  A full color page of  Calorie density of various food groups per pound showing examples of each food groups was provided to the patient.   - she has been  scheduled with Penny Crumpton, RDN, CDE for individualized diabetes education.  - I have approached her with the following plan to manage  her diabetes and patient agrees:   -She will continue to need insulin  treatment in order for her to maintain control of diabetes to target.  Before considering multiple daily injections of insulin , she is advised to optimize her basal insulin .  Accordingly, I advised her to increase her Tresiba  to 40 units nightly, continue to utilize her CGM.   - she is encouraged to call clinic for blood glucose levels less than 70 or above 200 mg /dl weekly average. -She is advised to continue metformin  500 mg p.o. twice daily.  I discussed and resume her Trulicity  at 1.5 mg subcutaneously weekly.  Side effects and precautions discussed with her.  Her  insurance did not provide coverage for Mounjaro.   - Specific targets for  A1c;  LDL, HDL,  and Triglycerides were discussed with the patient.  2) Blood Pressure /Hypertension:    Her blood pressure is controlled to target.  she is advised to continue her current medications including lisinopril  10 mg p.o. daily with breakfast .   3) Lipids/Hyperlipidemia:   Review of her recent lipid panel showed   worsening at LDL of 106.  She did not engage with whole food plant-based diet optimally.  She is advised to continue atorvastatin  10 mg p.o. nightly.     Side effects and precautions discussed with her.  4)  Weight/Diet:  Body mass index is 47.38 kg/m.  -She is gaining weight.  Her higher BMI is clearly complicating her diabetes care.   she is  a candidate for weight loss. I discussed with her the fact that loss of 5 - 10% of her  current body weight will have the most impact on her diabetes management.  The above detailed  ACLM recommendations for nutrition, exercise, sleep, social life, avoidance of risky substances, the need for restorative sleep   information will also detailed on discharge instructions.  5) vitamin D  deficiency: She is advised to continue vitamin D2 50,000 units weekly.    I discussed and added vitamin D3 5000 units daily.  6) hypothyroidism: New diagnosis. She would benefit from early intervention with levothyroxine.  I discussed and prescribed levothyroxine 50 mcg p.o. daily before breakfast.  7) Chronic Care/Health Maintenance:  -she  is on ACEI/ARB and Statin medications and  is encouraged to initiate and continue to follow up with Ophthalmology, Dentist,  Podiatrist at least yearly or according to recommendations, and advised to   stay away from smoking. I have recommended yearly flu vaccine and pneumonia vaccine at least every 5 years; moderate intensity exercise for up to 150 minutes weekly; and  sleep for 7- 9 hours a day.  Her diabetes foot exam is normal today.  -  she is  advised to maintain close follow up with Zarwolo, Gloria, FNP for primary care needs, as well as her other providers for optimal and coordinated care.    I spent  42  minutes in the care of  the patient today including review of labs from CMP, Lipids, Thyroid  Function, Hematology (current and previous including abstractions from other facilities); face-to-face time discussing  her blood glucose readings/logs, discussing hypoglycemia and hyperglycemia episodes and symptoms, medications doses, her options of short and long term treatment based on the latest standards of care / guidelines;  discussion about incorporating lifestyle medicine;  and documenting the encounter. Risk reduction counseling performed per USPSTF guidelines to reduce  obesity and cardiovascular risk factors.     Please refer to Patient Instructions for Blood Glucose Monitoring and Insulin /Medications Dosing Guide"  in media tab for additional information. Please  also refer to " Patient Self Inventory" in the Media  tab for reviewed elements of pertinent patient history.  Mckinnley Annis Kinder participated in the discussions, expressed understanding, and voiced agreement with the above plans.  All questions were answered to her satisfaction. she is encouraged to contact clinic should she have any questions or concerns prior to her return visit.    Follow up plan: - Return in about 3 months (around 07/31/2024) for F/U with Pre-visit Labs, Meter/CGM/Logs, A1c here.  Kalvin Orf, MD [facility] [address] [place], [place] [address] Phone: [phone]  Fax: [phone]    04/30/2024, 2:15 PM  This note was partially dictated with voice recognition software. Similar sounding words can be transcribed inadequately or may not  be corrected upon review.

## 2024-04-30 NOTE — Patient Instructions (Signed)

## 2024-05-09 ENCOUNTER — Ambulatory Visit: Payer: Self-pay | Admitting: Family Medicine

## 2024-05-09 DIAGNOSIS — E559 Vitamin D deficiency, unspecified: Secondary | ICD-10-CM

## 2024-05-09 MED ORDER — VITAMIN D (ERGOCALCIFEROL) 1.25 MG (50000 UNIT) PO CAPS: ORAL_CAPSULE | ORAL | 1 refills | Status: AC

## 2024-05-22 ENCOUNTER — Ambulatory Visit (INDEPENDENT_AMBULATORY_CARE_PROVIDER_SITE_OTHER): Admitting: Family Medicine

## 2024-05-22 VITALS — BP 139/84 | HR 82 | Resp 17 | Ht 66.5 in | Wt 302.4 lb

## 2024-05-22 DIAGNOSIS — I10 Essential (primary) hypertension: Secondary | ICD-10-CM

## 2024-05-22 DIAGNOSIS — E1165 Type 2 diabetes mellitus with hyperglycemia: Secondary | ICD-10-CM

## 2024-05-22 DIAGNOSIS — E038 Other specified hypothyroidism: Secondary | ICD-10-CM

## 2024-05-22 DIAGNOSIS — M5416 Radiculopathy, lumbar region: Secondary | ICD-10-CM

## 2024-05-22 NOTE — Progress Notes (Signed)
 Established Patient Office Visit  Subjective:  Patient ID: Katrina Montes, female    DOB: Jun 29, 1971  Age: 53 y.o. MRN: [id number]  CC: No chief complaint on file.   HPI Katrina Montes is a 53 y.o. female presents for labs review. For the details of today's visit, please refer to the assessment and plan.    Past Medical History:  Diagnosis Date   Anemia    Anxiety    Back pain    Carpal tunnel syndrome    Constipation    Depression    Diabetes mellitus    Diabetes mellitus, type II (HCC)    Hyperlipidemia    Hypertension    Joint pain    Lactose intolerance    Multiple sclerosis (HCC) 2016   Optic neuritis    Shortness of breath    Sleep apnea    Swallowing difficulty    Swelling of both lower extremities    Vision, loss, sudden    Vitamin D  deficiency     Past Surgical History:  Procedure Laterality Date   BREAST REDUCTION SURGERY     COLONOSCOPY WITH PROPOFOL  N/A 06/02/2022   Procedure: COLONOSCOPY WITH PROPOFOL ;  Surgeon: Suzette Espy, MD;  Location: [facility];  Service: Endoscopy;  Laterality: N/A;  8:45AM   CYST EXCISION     INCISION AND DRAINAGE ABSCESS Left 12/23/2021   Procedure: INCISION AND DRAINAGE BUTTOCK ABSCESS;  Surgeon: Lockie Rima, MD;  Location: [facility];  Service: General;  Laterality: Left;   MOUTH SURGERY  09/2023   ovarian cyst removed     TUBAL LIGATION     WISDOM TOOTH EXTRACTION      Family History  Problem Relation Age of Onset   Cancer Mother    Diabetes Mother    Hearing loss Mother    Thyroid  nodules Mother    Anemia Mother    Kidney disease Mother    Hypertension Mother    Obesity Mother    Breast cancer Mother    Hyperlipidemia Father    High blood pressure Father    Sleep apnea Father    Pancreatic cancer Father    Anemia Sister    Anemia Sister    Arthritis Maternal Grandmother    Arthritis Maternal Grandfather    Arthritis Paternal Grandmother    Kidney disease Paternal Grandmother     Hypertension Paternal Grandmother    Arthritis Paternal Grandfather    Vision loss Paternal Grandfather    Stroke Paternal Grandfather    Hyperlipidemia Paternal Grandfather    Pancreatic cancer Maternal Uncle    Kidney disease Paternal Uncle    Colon cancer Neg Hx    Colon polyps Neg Hx     Social History   Socioeconomic History   Marital status: Single    Spouse name: Not on file   Number of children: 0   Years of education: College   Highest education level: Bachelor's degree (e.g., BA, AB, BS)  Occupational History   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   Right handed.   Social Drivers of Health   Financial Resource Strain: Medium Risk (05/22/2024)  Overall Financial Resource Strain (CARDIA)    Difficulty of Paying Living Expenses: Somewhat hard  Food Insecurity: No Food Insecurity (05/22/2024)   Hunger Vital Sign    Worried About Running Out of Food in the Last Year: Never true    Ran Out of Food in the Last Year: Never true  Recent Concern: Food Insecurity - Food Insecurity Present (03/05/2024)   Hunger Vital Sign    Worried About Running Out of Food in the Last Year: Sometimes true    Ran Out of Food in the Last Year: Sometimes true  Transportation Needs: No Transportation Needs (05/22/2024)   PRAPARE - Administrator, Civil Service (Medical): No    Lack of Transportation (Non-Medical): No  Physical Activity: Insufficiently Active (05/22/2024)   Exercise Vital Sign    Days of Exercise per Week: 3 days    Minutes of Exercise per Session: 40 min  Stress: Stress Concern Present (05/22/2024)   [employer] of Occupational Health - Occupational Stress Questionnaire     Feeling of Stress : Very much  Social Connections: Moderately Integrated (05/22/2024)   Social Connection and Isolation Panel [NHANES]    Frequency of Communication with Friends and Family: Three times a week    Frequency of Social Gatherings with Friends and Family: Patient declined    Attends Religious Services: More than 4 times per year    Active Member of [employer] or Organizations: Yes    Attends Engineer, structural: More than 4 times per year    Marital Status: Never married  Intimate Partner Violence: Not At Risk (03/05/2024)   Humiliation, Afraid, Rape, and Kick questionnaire    Fear of Current or Ex-Partner: No    Emotionally Abused: No    Physically Abused: No    Sexually Abused: No    Outpatient Medications Prior to Visit  Medication Sig Dispense Refill   acetaminophen -codeine (TYLENOL  #3) 300-30 MG tablet Take 1 tablet by mouth every 6 (six) hours as needed.     APPLE CIDER VINEGAR PO Take 1 tablet by mouth daily.     atorvastatin  (LIPITOR) 40 MG tablet Take 1 tablet (40 mg total) by mouth daily. 90 tablet 3   celecoxib  (CELEBREX ) 100 MG capsule Take 1 capsule (100 mg total) by mouth 2 (two) times daily as needed for up to 420 doses. 60 capsule 6   Cholecalciferol (VITAMIN D3) 125 MCG (5000 UT) CAPS Take 1 capsule (5,000 Units total) by mouth daily with lunch. 90 capsule 3   Continuous Glucose Receiver (FREESTYLE LIBRE 3 READER) DEVI Use to test BG 4+ times daily. E11.65 1 each 0   Continuous Glucose Sensor (FREESTYLE LIBRE 3 PLUS SENSOR) MISC Use to monitor glucose continuously as directed. Change sensor every 15 days. 2 each 2   cyclobenzaprine  (FLEXERIL ) 10 MG tablet Take 1 tablet (10 mg total) by mouth 2 (two) times daily as needed for muscle spasms. 30 tablet 3   diclofenac  Sodium (VOLTAREN  ARTHRITIS PAIN) 1 % GEL 2 gram qid prn 100 g 11   diphenhydrAMINE  (BENADRYL ) 25 MG tablet Take 25 mg by mouth every 6 (six) hours as needed for allergies.     docusate sodium   (COLACE) 50 MG capsule Take 1 capsule (50 mg total) by mouth 2 (two) times daily. (Patient taking differently: Take 50 mg by mouth daily as needed for moderate constipation.) 60 capsule 11   DULoxetine  (CYMBALTA ) 60 MG capsule Take 1 capsule (60 mg total) by mouth daily. 90 capsule  3   ELDERBERRY PO Take 1,150 mg by mouth daily.     Erenumab -aooe (AIMOVIG ) 70 MG/ML SOAJ Inject 70 mg into the skin every 30 (thirty) days. 1.12 mL 11   ezetimibe  (ZETIA ) 10 MG tablet Take 1 tablet (10 mg total) by mouth daily. 90 tablet 3   ferrous sulfate  (EQL SLOW RELEASE IRON ) 160 (50 Fe) MG TBCR SR tablet Take 1 tablet (160 mg total) by mouth daily. 30 tablet 3   fluticasone  (FLONASE ) 50 MCG/ACT nasal spray Place 2 sprays into both nostrils daily. 16 g 0   gabapentin  (NEURONTIN ) 300 MG capsule TAKE 1 CAPSULE(300 MG) BY MOUTH THREE TIMES DAILY 90 capsule 3   ibuprofen (ADVIL,MOTRIN) 200 MG tablet Take 200 mg by mouth as needed for headache, moderate pain or mild pain.     insulin  degludec (TRESIBA  FLEXTOUCH) 200 UNIT/ML FlexTouch Pen Inject 40 Units into the skin at bedtime. 12 mL 2   Insulin  Pen Needle (BD PEN NEEDLE NANO U/F) 32G X 4 MM MISC 1 each by Does not apply route 4 (four) times daily. 100 each 2   levothyroxine  (SYNTHROID ) 50 MCG tablet Take 1 tablet (50 mcg total) by mouth daily before breakfast. 90 tablet 1   lisinopril  (ZESTRIL ) 20 MG tablet TAKE 1 TABLET(20 MG) BY MOUTH DAILY 30 tablet 11   meclizine  (ANTIVERT ) 25 MG tablet Take 1 tablet (25 mg total) by mouth 3 (three) times daily as needed for dizziness. 30 tablet 6   metFORMIN  (GLUCOPHAGE ) 500 MG tablet TAKE 1 TABLET(500 MG) BY MOUTH TWICE DAILY WITH A MEAL 60 tablet 2   Multiple Vitamin (MULTIVITAMIN) capsule Take 1 capsule by mouth daily.     mupirocin  ointment (BACTROBAN ) 2 % Apply 1 Application topically 2 (two) times daily. 30 g 1   ocrelizumab  (OCREVUS ) 300 MG/10ML injection Inject 10 mLs (300 mg total) into the vein every 6 (six) months. 20  mL 0   polyethylene glycol powder (GLYCOLAX /MIRALAX ) 17 GM/SCOOP powder Take 1 capful (17 g) in water by mouth daily. (Patient taking differently: Take 17 g by mouth daily as needed for moderate constipation.) 238 g 0   polyethylene glycol-electrolytes (NULYTELY ) 420 g solution As directed 4000 mL 0   PRESCRIPTION MEDICATION Place 1 drop into both eyes at bedtime.     promethazine -dextromethorphan (PROMETHAZINE -DM) 6.25-15 MG/5ML syrup Take 5 mLs by mouth 4 (four) times daily as needed. 118 mL 0   rizatriptan  (MAXALT ) 10 MG tablet Take 1 tablet (10 mg total) by mouth as needed for migraine. May repeat in 2 hours if needed. Max dose: 2/24 hr or 15/30 days. 45 tablet 3   traZODone  (DESYREL ) 50 MG tablet Take 2 tablets (100 mg total) by mouth at bedtime. 60 tablet 11   triamcinolone  cream (KENALOG ) 0.1 % Apply 1 Application topically 2 (two) times daily. 30 g 0   Vitamin D , Ergocalciferol , (DRISDOL ) 1.25 MG (50000 UNIT) CAPS capsule TAKE ONE CAPSULE BY MOUTH EVERY 7 DAYS 20 capsule 1   Dulaglutide  (TRULICITY ) 1.5 MG/0.5ML SOAJ Inject 1.5 mg into the skin once a week. (Patient not taking: Reported on 05/22/2024) 6 mL 1   No facility-administered medications prior to visit.    No Known Allergies  ROS Review of Systems  Constitutional:  Negative for chills and fever.  Eyes:  Negative for visual disturbance.  Respiratory:  Negative for chest tightness and shortness of breath.   Neurological:  Negative for dizziness and headaches.      Objective:  Physical Exam Constitutional:      Appearance: She is obese.  HENT:     Head: Normocephalic.     Mouth/Throat:     Mouth: Mucous membranes are moist.  Cardiovascular:     Rate and Rhythm: Normal rate.     Heart sounds: Normal heart sounds.  Pulmonary:     Effort: Pulmonary effort is normal.     Breath sounds: Normal breath sounds.  Neurological:     Mental Status: She is alert.     BP 139/84   Pulse 82   Resp 17   Ht 5' 6.5" (1.689  m)   Wt (!) 302 lb 6.4 oz (137.2 kg)   SpO2 96%   BMI 48.08 kg/m  Wt Readings from Last 3 Encounters:  05/22/24 (!) 302 lb 6.4 oz (137.2 kg)  04/30/24 298 lb (135.2 kg)  03/05/24 289 lb (131.1 kg)    Lab Results  Component Value Date   TSH 1.860 04/23/2024   Lab Results  Component Value Date   WBC 6.0 04/23/2024   HGB 11.0 (L) 04/23/2024   HCT 34.2 04/23/2024   MCV 81 04/23/2024   PLT 318 04/23/2024   Lab Results  Component Value Date   NA 137 04/23/2024   K 5.0 04/23/2024   CO2 21 04/23/2024   GLUCOSE 281 (H) 04/23/2024   BUN 7 04/23/2024   CREATININE 0.71 04/23/2024   BILITOT 0.2 04/23/2024   ALKPHOS 102 04/23/2024   AST 11 04/23/2024   ALT 15 04/23/2024   PROT 6.9 04/23/2024   ALBUMIN 4.0 04/23/2024   CALCIUM  8.8 04/23/2024   ANIONGAP 7 12/26/2021   EGFR 103 04/23/2024   Lab Results  Component Value Date   CHOL 197 04/23/2024   Lab Results  Component Value Date   HDL 79 04/23/2024   Lab Results  Component Value Date   LDLCALC 106 (H) 04/23/2024   Lab Results  Component Value Date   TRIG 67 04/23/2024   Lab Results  Component Value Date   CHOLHDL 2.5 04/23/2024   Lab Results  Component Value Date   HGBA1C 10.5 (H) 04/23/2024      Assessment & Plan:  Type 2 diabetes mellitus with hyperglycemia, without long-term current use of insulin  (HCC) Assessment & Plan: The patient reports that she is unable to afford Mounjaro due to the medication costing her $300. A referral to the pharmacy will be placed today for medication assistance.   Orders: -     Amb Referral To Provider Referral Exercise Program (P.R.E.P)  Morbid obesity (HCC) Assessment & Plan: The patient is tearful in the clinic as she describes her continued efforts to lose weight, which have been unsuccessful despite her attempts. A referral will be placed for PREP (Provider Referral Exercise Program) to support her in achieving her weight loss goals.   Orders: -     Amb Referral  To Provider Referral Exercise Program (P.R.E.P)  Uncontrolled type 2 diabetes mellitus with hyperglycemia (HCC) -     AMB Referral VBCI Care Management -     Amb Referral To Provider Referral Exercise Program (P.R.E.P)  TSH (thyroid -stimulating hormone deficiency) -     TSH + free T4  Essential hypertension, benign -     Amb Referral To Provider Referral Exercise Program (P.R.E.P)  Left lumbar radiculopathy -     Amb Referral To Provider Referral Exercise Program (P.R.E.P)  Note: This chart has been completed using Engineer, civil (consulting) software, and while attempts have been  made to ensure accuracy, certain words and phrases may not be transcribed as intended.    Follow-up: No follow-ups on file.   Aftin Lye, FNP

## 2024-05-22 NOTE — Patient Instructions (Addendum)
 I appreciate the opportunity to provide care to you today!  Labs: please stop by the lab today to get your blood drawn ( TSH )  For a Healthier YOU, I Recommend: Reducing your intake of sugar, sodium, carbohydrates, and saturated fats. Increasing your fiber intake by incorporating more whole grains, fruits, and vegetables into your meals. Setting healthy goals with a focus on lowering your consumption of carbs, sugar, and unhealthy fats. Adding variety to your diet by including a wide range of fruits and vegetables. Cutting back on soda and limiting processed foods as much as possible. Staying active: In addition to taking your weight loss medication, aim for at least 150 minutes of moderate-intensity physical activity each week for optimal results.   Please follow up if your symptoms worsen or fail to improve.   Referrals today- pharmacy and provider referral exercise program   Please continue to a heart-healthy diet and increase your physical activities. Try to exercise for at least five days a week.    It was a pleasure to see you and I look forward to continuing to work together on your health and well-being. Please do not hesitate to call the office if you need care or have questions about your care.  In case of emergency, please visit the Emergency Department for urgent care, or contact our clinic at [phone] to schedule an appointment. We're here to help you!   Have a wonderful day and week. With Gratitude, Hayven Croy MSN, FNP-BC

## 2024-05-22 NOTE — Assessment & Plan Note (Signed)
 The patient is tearful in the clinic as she describes her continued efforts to lose weight, which have been unsuccessful despite her attempts. A referral will be placed for PREP (Provider Referral Exercise Program) to support her in achieving her weight loss goals.

## 2024-05-22 NOTE — Assessment & Plan Note (Signed)
 The patient reports that she is unable to afford Mounjaro due to the medication costing her $300. A referral to the pharmacy will be placed today for medication assistance.

## 2024-05-23 ENCOUNTER — Telehealth: Payer: Self-pay

## 2024-05-23 NOTE — Progress Notes (Signed)
 Care Guide Pharmacy Note  05/23/2024 Name: Katrina Montes MRN: [id number] DOB: 1971-01-29  Referred By: Zarwolo, Gloria, FNP Reason for referral: Complex Care Management (Outreach to schedule with Pharm d )   Katrina Montes is a 53 y.o. year old female who is a primary care patient of Zarwolo, Gloria, FNP.  Katrina Montes was referred to the pharmacist for assistance related to: DMII  Successful contact was made with the patient to discuss pharmacy services including being ready for the pharmacist to call at least 5 minutes before the scheduled appointment time and to have medication bottles and any blood pressure readings ready for review. The patient agreed to meet with the pharmacist via telephone visit on (date/time).05/27/2024  Lenton Rail , RMA     [place]  [facility] Guide  Direct Dial : [phone]  Website: [email or web address]

## 2024-05-24 LAB — TSH+FREE T4
Free T4: 1.04 ng/dL (ref 0.82–1.77)
TSH: 0.763 u[IU]/mL (ref 0.450–4.500)

## 2024-05-26 ENCOUNTER — Telehealth: Payer: Self-pay

## 2024-05-26 NOTE — Telephone Encounter (Signed)
 Called  re: PREP program referral, left voicemail requesting return call.

## 2024-05-27 ENCOUNTER — Other Ambulatory Visit: Payer: Self-pay

## 2024-05-27 NOTE — Progress Notes (Signed)
 05/27/2024 Name: Katrina Montes MRN: [id number] DOB: [birth year]  Chief Complaint  Patient presents with   Diabetes   Medication Management    Katrina Montes is a 53 y.o. year old female who presented for a telephone visit.   They were referred to the pharmacist by their PCP for assistance in managing diabetes.    Subjective:  Care Team: Primary Care Provider: Zarwolo, Gloria, FNP ; Next Scheduled Visit:  Future Appointments  Date Time Provider Department Center  06/10/2024 11:00 AM Rolando Cliche, [facility] CHL-POPH None  07/31/2024  8:30 AM [place], Jaynee Meyer, MD REA-REA None  08/28/2024  8:20 AM Zarwolo, Gloria, FNP RPC-RPC RPC  03/10/2025  8:00 AM RPC-ANNUAL WELLNESS VISIT RPC-RPC RPC    Medication Access/Adherence  Current Pharmacy:  [facility] [id number] - [place], [place] - [address] [place] [address] Phone: [phone] Fax: [phone]  [facility] [address], [place] - [address] [place] [address] [place] [address]  [place] [address] Phone: [phone] Fax: [phone]  [facility] [address] [place] [address] Phone: [phone] Fax: [phone]  [facility] - [facility] [address] [place] [address] Phone: [phone] Fax: [phone]  [facility] - [place], [place] - [address] [place] [address] Phone: [phone] Fax: [phone]  [facility] - [place], [place] - [address] [place] [address] [place] [address] Phone: [phone] Fax: [phone]   Patient reports affordability concerns with their medications: Yes  Patient reports access/transportation concerns to their pharmacy: No  Patient reports adherence concerns with their medications:  Yes  - due to cost.   Diabetes:  Current medications: tresiba  40 units  daily, metformin  IR 500mg  BID Medications tried in the past: trulicity , mounjaro   Current glucose readings: 200s Patient denies hypoglycemic s/sx including dizziness, shakiness, sweating. Patient denies hyperglycemic symptoms including polyuria, polydipsia, polyphagia, nocturia, neuropathy, blurred vision.  Eating well, exercising, checking sugars routinely. Cost of both mounjaro and trulicity  have been too high this year. Is unsure of insurance deductible. Pharmacy had told her trulicity  would be about $370 and Mounjaor would be somewhere in the $600 range.   Objective:  Lab Results  Component Value Date   HGBA1C 10.5 (H) 04/23/2024    Lab Results  Component Value Date   CREATININE 0.71 04/23/2024   BUN 7 04/23/2024   NA 137 04/23/2024   K 5.0 04/23/2024   CL 102 04/23/2024   CO2 21 04/23/2024    Lab Results  Component Value Date   CHOL 197 04/23/2024   HDL 79 04/23/2024   LDLCALC 106 (H) 04/23/2024   TRIG 67 04/23/2024   CHOLHDL 2.5 04/23/2024    Medications Reviewed Today   Medications were not reviewed in this encounter       Assessment/Plan:   Diabetes: - Currently uncontrolled - Reviewed long term cardiovascular and renal outcomes of uncontrolled blood sugar - Reviewed goal A1c, goal fasting, and goal 2 hour post prandial glucose - Reviewed dietary modifications  - Reviewed lifestyle modifications  - Recommend to check glucose up to four times daily as directed  - Meets financial criteria for Tresiba  patient assistance program through [employer]. Will collaborate with provider, CPhT, and patient to pursue assistance.  - Performed pharmacy test claims and confirmed through insurance.  Cost of Mounjaro for 84 days supply would be $481 ($141 copay, $340 goes toward deductible); next 84 days supply would be $141.   Cost of Trulicity  for 84 days supply would be $481 ($141 copay, $340 goes toward deductible); next 84 days supply would be $141.   Follow Up Plan:  CPhT to send tresiba  PAP, will await patient response on Mounjaro copay. Should still have active Rx at [facility] from Dr Nida.Attempted to reach back out to patient but no answer.  2 wk telephone call   Rolando Cliche, PharmD, BCGP Clinical Pharmacist  [phone]

## 2024-06-02 ENCOUNTER — Ambulatory Visit: Payer: Self-pay | Admitting: Family Medicine

## 2024-06-05 ENCOUNTER — Telehealth: Payer: Self-pay

## 2024-06-05 NOTE — Telephone Encounter (Signed)
Called again re: PREP program referral, left voicemail requesting return call

## 2024-06-10 ENCOUNTER — Telehealth: Payer: Self-pay

## 2024-06-10 ENCOUNTER — Other Ambulatory Visit: Payer: Self-pay

## 2024-06-10 NOTE — Progress Notes (Signed)
   06/10/2024  Patient ID: Katrina Montes, female   DOB: [birth year], 53 y.o.   MRN: [id number]  Attempted to contact patient for scheduled appointment for medication management. Left HIPAA compliant message for patient to return my call at their convenience.   Lang Sieve, PharmD, BCGP Clinical Pharmacist  [phone]

## 2024-06-10 NOTE — Telephone Encounter (Signed)
 Returned her call mz:EMZE program referral, left voicemail requesting return call.

## 2024-06-13 ENCOUNTER — Other Ambulatory Visit (HOSPITAL_COMMUNITY): Payer: Self-pay

## 2024-06-13 ENCOUNTER — Telehealth: Payer: Self-pay

## 2024-06-13 NOTE — Telephone Encounter (Signed)
-----   Message from Inocente JINNY Butcher sent at 06/13/2024 11:19 AM EDT -----  ----- Message ----- From: Pandora Cadet, [facility] Sent: 05/27/2024  12:51 PM EDT To: Rx Med Assistance Team  Please send PAP for Tresiba  40 units daily to patient through [employer] PAP. Prescriber for PAP will be Gloria Zarwolo, pt's PCP at [facility].

## 2024-06-13 NOTE — Progress Notes (Signed)
 Pharmacy Medication Assistance Program Note    06/17/2024  Patient ID: Katrina Montes, female   DOB: 11/20/1971, 53 y.o.   MRN: [id number]     06/13/2024  Outreach [employer]  Manufacturer [employer] Novo Nordisk  [facility] Tresiba   Type of Radiographer, therapeutic Assistance  Date Application Sent to Patient 06/17/2024  Application Items Requested Application;Proof of Income     NEW - mailed to pt's home

## 2024-07-17 ENCOUNTER — Ambulatory Visit

## 2024-07-17 DIAGNOSIS — E1165 Type 2 diabetes mellitus with hyperglycemia: Secondary | ICD-10-CM

## 2024-07-17 LAB — HM DIABETES EYE EXAM

## 2024-07-17 NOTE — Progress Notes (Signed)
 Arrived on 07/17/2024 and has given verbal consent to obtain images and complete their overdue diabetic retinal screening.  The images have been sent to an ophthalmologist or optometrist for review and interpretation.  Results will be sent back to [facility] for review.  Patient has been informed they will be contacted when we receive the results via telephone or MyChart.  Patient confirms that she does have a regular eye doctor for her eyecare needs.She sees Dr Darroll at [facility] in [place] and has an appt with them this morning at 10:30 AM.

## 2024-07-31 ENCOUNTER — Ambulatory Visit: Admitting: "Endocrinology

## 2024-08-05 ENCOUNTER — Other Ambulatory Visit: Payer: Self-pay | Admitting: "Endocrinology

## 2024-08-08 ENCOUNTER — Encounter: Payer: Self-pay | Admitting: Radiology

## 2024-08-13 ENCOUNTER — Telehealth: Payer: Self-pay

## 2024-08-13 NOTE — Telephone Encounter (Signed)
 Called and spoke to pt and was able to get her scheduled

## 2024-08-14 ENCOUNTER — Encounter: Payer: Self-pay | Admitting: Neurology

## 2024-08-14 ENCOUNTER — Ambulatory Visit (INDEPENDENT_AMBULATORY_CARE_PROVIDER_SITE_OTHER): Admitting: Neurology

## 2024-08-14 VITALS — BP 147/89 | HR 69 | Resp 17 | Ht 66.5 in

## 2024-08-14 DIAGNOSIS — G43709 Chronic migraine without aura, not intractable, without status migrainosus: Secondary | ICD-10-CM | POA: Diagnosis not present

## 2024-08-14 DIAGNOSIS — G35 Multiple sclerosis: Secondary | ICD-10-CM

## 2024-08-14 DIAGNOSIS — G4733 Obstructive sleep apnea (adult) (pediatric): Secondary | ICD-10-CM | POA: Diagnosis not present

## 2024-08-14 DIAGNOSIS — R5383 Other fatigue: Secondary | ICD-10-CM

## 2024-08-14 NOTE — Progress Notes (Signed)
 ASSESSMENT AND PLAN 53 y.o. year old female   Relapsing remitting multiple sclerosis  Complains of increased fatigue, migraine headache at end of previous ocrelizumab  infusion,  Most recent MRI of brain, cervical spine were in May 2024, were stable, showed stable MS lesions, C3-C4 and C4-C5 and borderline spinal stenosis at C5-C6. There is no nerve root compression  Discussed with patient about options, may try Briumvi every 6 months, hope this will improve her recurrent symptoms with ocrelizumab  infusion   Laboratory evaluation Depression, chronic insomnia Continue Cymbalta  60 mg daily   Migraine headaches  Aimovig  70 mg monthly was helpful,   DIAGNOSTIC DATA (LABS, IMAGING, TESTING) - I reviewed patient records, labs, notes, testing and imaging myself where available.    HISTORY   Katrina Montes is 40 right-handed African-American female, accompanied by her mother, referred by her primary care physician Dr. Bari for evaluation of right optic neuritis, abnormal MRI of the brain, suspicious for relapsing remitting multiple sclerosis, initial evaluation was on Jan 14 2015.   She had a past medical history of hypertension, diabetes, A1c was 11.9 in July 2015, most recent A1c was still high at 8.9, used to work as a Counselling psychologist job, which was out sourced recently,   In May of 2015, she had transient right eye blurry vision, was considered due to her high glucose, recovered within a few weeks   Since January 2016, she began to experience intermittent vertigo, dizziness, also had worsening right eye blurry vision, color washing out, could only see her rim at her right peripheral visual field.   Relapsing Remitting Multiple Sclerosis was diagnosed since January 2016: Based on her abnormal MRI of the brain, there was also cervical spinal cord involvement   MRI of the brain without contrast at [facility] ([place] imaging January 2016 showed multiple periventricular white matter disease, significant  involvement of corpus callosum, perpendicular to ventricle, consistent with multiple sclerosis, now was also suspicious for right optic nerve edema, with hyperintensity signal at T2, FLAIR, no contrast enhancement, MRA of brain was normal.   MRI of the cervical spine with and without contrast in Jan 2016: showing hyperintense foci within the spinal cord posteriorly at C4 and posteriolaterally at C7-T1 consistent with multiple sclerosis plaques. There were no enhancing foci.   Spinal fluid testing January 13 2015, total protein was 708, RBC was 1600, RBC was 7,, cloudy reddish-looking, elevated IgG 17.7, IgG index 0.97, IgG synthetic rate 238, 0 oligoclonal bands,   Visual evoked potential showed prolongation on the right side.   She was treated with IV Solu-Medrol  for 3 days in Jan 2016, which only mild improved her right vision, has significant worsening of her blood glucose level.She was treated with Achtar injection in Feb 2016, but still having significant residual visual difficulty.   JC virus was positive with titer of 1.1 3 (Jan 14 2015),  laboratory evaluation showed normal or negative CPK, HIV, protein electrophoresis, positive varicella-zoster virus antibody, mild elevated ESR, C-reactive protein, hepatitis panel, Lyme titer, showed anemia, hemoglobin 11.8, otherwise normal CBC, normal CMP, with exception of elevated glucose, normal TSH,   She was treated with Tysarbril since March 14th, 2016, was switched to ocrelizumab  since February 2020   MRI of the brain with and without contrast in April 2019 showed multiple supratentorium lesions, no contrast enhancement, no significant change compared to previous MRI of the brain   Vision: History of right optic neuritis, with partial recovery, now complains of blurry vision, difficult to read  anything without bleeders or magnified glasses, difficult to read through computer screen or watch TV, visual acuity, OS 20/40, OD 20/50-1   Cognitive  impairment: has to take frequent notes, difficulty to concentrate, to express herself.   Gait abnormality: Treated easily, generalized weakness, also complicated by her low back pain, left sided radicular pain,   Spasticity: Bilateral lower extremity spasticity, is taking gabapentin  300 mg 3 times a day previously tried and failed baclofen    Fatigue: Severe fatigue, also complicated by her depression anxiety chronic insomnia, body achy pain, limit her daily activity more than 50%, she has heavy menstruation-like cycle, chronic anemia, recent hemoglobin was 10.6.   Chronic migraine: She also has history of chronic migraine headaches, retro-orbital area severe pounding headache with associated light noise sensitivity, and happened frequently, up to 3 to four times each week, Maxalt  as needed was helpful,   Severe low back pain, radiating pain to left lower extremity,  MRI of lumbar in July 201 there is minimum anterolisthesis of L4 upon L5, due to severe facet hypertrophy, associated with left ligamentum flavum hypertrophy and disc protrusion, there is moderate  Left foraminal and lateral recess stenosis, potential for left L4-5 nerve root compression.   Diabetic peripheral neuropathy, Progressive worsening bilateral feet paresthesia, now taking insulin    Depression anxiety, chronic insomnia, polypharmacy treatment:  Trazodone  50 mg every night, gabapentin  300 mg 3 times a day, Topamax  100 mg twice a day, Flexeril  as needed   Weakness in hands: EMG nerve conduction study from outside clinic confirmed the diagnosis of severe right median neuropathy,   Urinary frequency: stable Hearing Loss-she reports occasional ringing in her ears, notices her hearing is not as good as it used to be, having to read lips, this is embarrassing to her   Mild obstructive sleep apnea, using CPAP machine,   Vitamin D  deficiency: Vitamin D  level was 23, on supplement     Update 15 2022 She is tolerating  ocrelizumab , today alone at visit, constellation of complaints, has been out of her insulin  due to financial concerns, last A1c December 2021 was 12.3, continue complains of fatigue, depression, gait abnormality, frequent migraine headaches, mild improvement in her low back pain, there is no flareup of her MS symptoms, urinary urgency, mild gait abnormalities   We personally reviewed most recent MRI of the brain with without contrast in February 2021: Multiple T2/FLAIR hyperintensity in the hemisphere, left thalamus, brainstem, none of this is acute.  No contrast-enhancement     UPDATE April 03 2023 She tolerate ocrelizumab , seems to notice benefit only last 4 months, feeling fatigue, body achy pain 2 months prior to next infusion.  6 months history of  left arm issues, left hand numbness, pain, difficulty to use left hand,   EMG/NCS by Dr. Prentice Masters in June 2020 showed severe left carpal tunnel syndrome.  She has no significant neck pain, no right arm pain, more balance issues, fell twice  She has noticed some speech change,   We personally reviewed MRI of the brain with without contrast March 2023, multiple supra and inferior tentorium chronic demyelinating plaque, no contrast-enhancement, no change from 2021  UPDATE August 14 2024: She complains of excessive fatigue at the end of previous treatment cycle with ocrelizumab , also with more frquent migraine, almost daiiy,  Otherwise no clear MS flareup, continue has mild gait abnormality,  REVIEW OF SYSTEMS: Out of a complete 14 system review of symptoms, the patient complains only of the following symptoms, and all other  reviewed systems are negative.  See HPI  PHYSICAL EXAM  Vitals:   04/03/23 1141  BP: 133/84  Pulse: 85  Weight: 268 lb (121.6 kg)  Height: 5' 7 (1.702 m)   Body mass index is 41.97 kg/m.   PHYSICAL EXAMNIATION:  Gen: NAD, conversant, well nourised, well groomed                      NEUROLOGICAL  EXAM:  MENTAL STATUS: Speech/cognition: Awake, alert oriented to history taking and casual conversation  CRANIAL NERVES: CN II: Visual fields are full to confrontation.  Pupils are round equal and briskly reactive to light. CN III, IV, VI: extraocular movement are normal. No ptosis. CN V: Facial sensation is intact to soft touch  CN VII: Face is symmetric with normal eye closure and smile. CN VIII: Hearing is normal to casual conversation CN XI: Head turning and shoulder shrug are intact  MOTOR: No significant bilateral upper and lower extremity proximal and distal weakness  REFLEXES: Reflexes are 2+ and symmetric   SENSORY: Intact to light touch  COORDINATION: Rapid alternating movements and fine finger movements are intact. There is no dysmetria on finger-to-nose and heel-knee-shin.    GAIT/STANCE: Need push-up to get up from seated position, mildly antalgic,   ALLERGIES: No Known Allergies  HOME MEDICATIONS: Outpatient Medications Prior to Visit  Medication Sig Dispense Refill   acetaminophen -codeine (TYLENOL  #3) 300-30 MG tablet Take 1 tablet by mouth every 6 (six) hours as needed.     APPLE CIDER VINEGAR PO Take 1 tablet by mouth daily.     atorvastatin  (LIPITOR) 40 MG tablet Take 1 tablet (40 mg total) by mouth daily. 90 tablet 3   celecoxib  (CELEBREX ) 100 MG capsule Take 1 capsule (100 mg total) by mouth 2 (two) times daily as needed for up to 420 doses. 60 capsule 6   Cholecalciferol (VITAMIN D3) 125 MCG (5000 UT) CAPS Take 1 capsule (5,000 Units total) by mouth daily with lunch. 90 capsule 3   Continuous Glucose Receiver (FREESTYLE LIBRE 3 READER) DEVI Use to test BG 4+ times daily. E11.65 1 each 0   Continuous Glucose Sensor (FREESTYLE LIBRE 3 PLUS SENSOR) MISC Use to monitor glucose continuously as directed. Change sensor every 15 days. 2 each 2   cyclobenzaprine  (FLEXERIL ) 10 MG tablet Take 1 tablet (10 mg total) by mouth 2 (two) times daily as needed for muscle  spasms. 30 tablet 3   diclofenac  Sodium (VOLTAREN  ARTHRITIS PAIN) 1 % GEL 2 gram qid prn 100 g 11   diphenhydrAMINE  (BENADRYL ) 25 MG tablet Take 25 mg by mouth every 6 (six) hours as needed for allergies.     docusate sodium  (COLACE) 50 MG capsule Take 1 capsule (50 mg total) by mouth 2 (two) times daily. (Patient taking differently: Take 50 mg by mouth daily as needed for moderate constipation.) 60 capsule 11   Dulaglutide  (TRULICITY ) 1.5 MG/0.5ML SOAJ Inject 1.5 mg into the skin once a week. 6 mL 1   DULoxetine  (CYMBALTA ) 60 MG capsule Take 1 capsule (60 mg total) by mouth daily. 90 capsule 3   ELDERBERRY PO Take 1,150 mg by mouth daily.     Erenumab -aooe (AIMOVIG ) 70 MG/ML SOAJ Inject 70 mg into the skin every 30 (thirty) days. 1.12 mL 11   ezetimibe  (ZETIA ) 10 MG tablet Take 1 tablet (10 mg total) by mouth daily. 90 tablet 3   fluticasone  (FLONASE ) 50 MCG/ACT nasal spray Place 2 sprays into both nostrils  daily. 16 g 0   gabapentin  (NEURONTIN ) 300 MG capsule TAKE 1 CAPSULE(300 MG) BY MOUTH THREE TIMES DAILY 90 capsule 3   ibuprofen (ADVIL,MOTRIN) 200 MG tablet Take 200 mg by mouth as needed for headache, moderate pain or mild pain.     insulin  degludec (TRESIBA  FLEXTOUCH) 200 UNIT/ML FlexTouch Pen Inject 40 Units into the skin at bedtime. 12 mL 2   Insulin  Pen Needle (BD PEN NEEDLE NANO U/F) 32G X 4 MM MISC 1 each by Does not apply route 4 (four) times daily. 100 each 2   levothyroxine  (SYNTHROID ) 50 MCG tablet TAKE 1 TABLET(50 MCG) BY MOUTH DAILY BEFORE BREAKFAST 90 tablet 1   lisinopril  (ZESTRIL ) 20 MG tablet TAKE 1 TABLET(20 MG) BY MOUTH DAILY 30 tablet 11   meclizine  (ANTIVERT ) 25 MG tablet Take 1 tablet (25 mg total) by mouth 3 (three) times daily as needed for dizziness. 30 tablet 6   metFORMIN  (GLUCOPHAGE ) 500 MG tablet TAKE 1 TABLET(500 MG) BY MOUTH TWICE DAILY WITH A MEAL 60 tablet 2   Multiple Vitamin (MULTIVITAMIN) capsule Take 1 capsule by mouth daily.     mupirocin  ointment  (BACTROBAN ) 2 % Apply 1 Application topically 2 (two) times daily. 30 g 1   ocrelizumab  (OCREVUS ) 300 MG/10ML injection Inject 10 mLs (300 mg total) into the vein every 6 (six) months. 20 mL 0   polyethylene glycol powder (GLYCOLAX /MIRALAX ) 17 GM/SCOOP powder Take 1 capful (17 g) in water by mouth daily. (Patient taking differently: Take 17 g by mouth daily as needed for moderate constipation.) 238 g 0   PRESCRIPTION MEDICATION Place 1 drop into both eyes at bedtime.     promethazine -dextromethorphan (PROMETHAZINE -DM) 6.25-15 MG/5ML syrup Take 5 mLs by mouth 4 (four) times daily as needed. 118 mL 0   rizatriptan  (MAXALT ) 10 MG tablet Take 1 tablet (10 mg total) by mouth as needed for migraine. May repeat in 2 hours if needed. Max dose: 2/24 hr or 15/30 days. 45 tablet 3   traZODone  (DESYREL ) 50 MG tablet Take 2 tablets (100 mg total) by mouth at bedtime. 60 tablet 11   triamcinolone  cream (KENALOG ) 0.1 % Apply 1 Application topically 2 (two) times daily. 30 g 0   Vitamin D , Ergocalciferol , (DRISDOL ) 1.25 MG (50000 UNIT) CAPS capsule TAKE ONE CAPSULE BY MOUTH EVERY 7 DAYS 20 capsule 1   ferrous sulfate  (EQL SLOW RELEASE IRON ) 160 (50 Fe) MG TBCR SR tablet Take 1 tablet (160 mg total) by mouth daily. 30 tablet 3   polyethylene glycol-electrolytes (NULYTELY ) 420 g solution As directed 4000 mL 0   No facility-administered medications prior to visit.    PAST MEDICAL HISTORY: Past Medical History:  Diagnosis Date   Anemia    Anxiety    Back pain    Carpal tunnel syndrome    Constipation    Depression    Diabetes mellitus    Diabetes mellitus, type II (HCC)    Hyperlipidemia    Hypertension    Joint pain    Lactose intolerance    Multiple sclerosis (HCC) 2016   Optic neuritis    Shortness of breath    Sleep apnea    Swallowing difficulty    Swelling of both lower extremities    Vision, loss, sudden    Vitamin D  deficiency     PAST SURGICAL HISTORY: Past Surgical History:  Procedure  Laterality Date   BREAST REDUCTION SURGERY     COLONOSCOPY WITH PROPOFOL  N/A 06/02/2022   Procedure: COLONOSCOPY  WITH PROPOFOL ;  Surgeon: Shaaron Lamar HERO, MD;  Location: [facility];  Service: Endoscopy;  Laterality: N/A;  8:45AM   CYST EXCISION     INCISION AND DRAINAGE ABSCESS Left 12/23/2021   Procedure: INCISION AND DRAINAGE BUTTOCK ABSCESS;  Surgeon: Aron Shoulders, MD;  Location: [facility] OR;  Service: General;  Laterality: Left;   MOUTH SURGERY  09/2023   ovarian cyst removed     TUBAL LIGATION     WISDOM TOOTH EXTRACTION      FAMILY HISTORY: Family History  Problem Relation Age of Onset   Cancer Mother    Diabetes Mother    Hearing loss Mother    Thyroid  nodules Mother    Anemia Mother    Kidney disease Mother    Hypertension Mother    Obesity Mother    Breast cancer Mother    Hyperlipidemia Father    High blood pressure Father    Sleep apnea Father    Pancreatic cancer Father    Anemia Sister    Anemia Sister    Arthritis Maternal Grandmother    Arthritis Maternal Grandfather    Arthritis Paternal Grandmother    Kidney disease Paternal Grandmother    Hypertension Paternal Grandmother    Arthritis Paternal Grandfather    Vision loss Paternal Grandfather    Stroke Paternal Grandfather    Hyperlipidemia Paternal Grandfather    Pancreatic cancer Maternal Uncle    Kidney disease Paternal Uncle    Colon cancer Neg Hx    Colon polyps Neg Hx     SOCIAL HISTORY: Social History   Socioeconomic History   Marital status: Single    Spouse name: Not on file   Number of children: 0   Years of education: College   Highest education level: Bachelor's degree (e.g., BA, AB, BS)  Occupational History   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   Right handed.   Social Drivers of Health   Financial Resource Strain: Medium Risk (05/22/2024)   Overall Financial Resource Strain (CARDIA)    Difficulty of Paying Living Expenses: Somewhat hard  Food Insecurity: No Food Insecurity (05/22/2024)   Hunger Vital Sign    Worried About Running Out of Food in the Last Year: Never true    Ran Out of Food in the Last Year: Never true  Recent Concern: Food Insecurity - Food Insecurity Present (03/05/2024)   Hunger Vital Sign    Worried About Running Out of Food in the Last Year: Sometimes true    Ran Out of Food in the Last Year: Sometimes true  Transportation Needs: No Transportation Needs (05/22/2024)   PRAPARE - Administrator, Civil Service (Medical): No    Lack of Transportation (Non-Medical): No  Physical Activity: Insufficiently Active (05/22/2024)   Exercise Vital Sign    Days of Exercise per Week: 3 days    Minutes of Exercise per Session: 40 min  Stress: Stress Concern Present (05/22/2024)   [employer] of Occupational Health - Occupational Stress Questionnaire    Feeling of Stress : Very much  Social Connections: Moderately Integrated (05/22/2024)   Social Connection and Isolation Panel  Frequency of Communication with Friends and Family: Three times a week    Frequency of Social Gatherings with Friends and Family: Patient declined    Attends Religious Services: More than 4 times per year    Active Member of [employer] or Organizations: Yes    Attends Banker Meetings: More than 4 times per year    Marital Status: Never married  Intimate Partner Violence: Not At Risk (03/05/2024)   Humiliation, Afraid, Rape, and Kick questionnaire    Fear of Current or Ex-Partner: No    Emotionally Abused: No    Physically Abused: No    Sexually Abused: No    Modena Callander. M.D. Ph.D.

## 2024-08-19 ENCOUNTER — Other Ambulatory Visit

## 2024-08-19 DIAGNOSIS — Z0289 Encounter for other administrative examinations: Secondary | ICD-10-CM

## 2024-08-19 NOTE — Addendum Note (Signed)
 Addended by: ONEITA HOIST E on: 08/19/2024 11:47 AM   Modules accepted: Orders

## 2024-08-20 ENCOUNTER — Telehealth: Payer: Self-pay | Admitting: Neurology

## 2024-08-20 NOTE — Telephone Encounter (Signed)
 Please call patient not all the lab result comes back yet, but A1c was significantly elevated 11.4, indicating poorly controlled diabetes, I have forwarded the result to her primary care Zarwolo, Gloria, FNP, she should contact her for better management of her diabetes,  Moderate low vitamin D  15.1, should take over-the-counter D3 1000 units daily,

## 2024-08-20 NOTE — Telephone Encounter (Signed)
 Lvm 1st attempt By hf   IF PT CALLS BACK GIVE RESULTS AND TELL HER THAT WE NEED HER TO COME TO THE OFFICE TO SIGN THE BRIUMVI START FORM.

## 2024-08-21 NOTE — Telephone Encounter (Signed)
 Called and informed of results. Pt voiced understanding to contact pcp for diabetes management and to come by the office to sign form

## 2024-08-26 ENCOUNTER — Ambulatory Visit: Payer: Self-pay | Admitting: Neurology

## 2024-08-26 DIAGNOSIS — G35 Multiple sclerosis: Secondary | ICD-10-CM

## 2024-08-26 LAB — COMPREHENSIVE METABOLIC PANEL WITH GFR
ALT: 24 IU/L (ref 0–32)
AST: 19 IU/L (ref 0–40)
Albumin: 4.1 g/dL (ref 3.8–4.9)
Alkaline Phosphatase: 102 IU/L (ref 44–121)
BUN/Creatinine Ratio: 11 (ref 9–23)
BUN: 7 mg/dL (ref 6–24)
Bilirubin Total: 0.7 mg/dL (ref 0.0–1.2)
CO2: 21 mmol/L (ref 20–29)
Calcium: 9 mg/dL (ref 8.7–10.2)
Chloride: 102 mmol/L (ref 96–106)
Creatinine, Ser: 0.63 mg/dL (ref 0.57–1.00)
Globulin, Total: 3 g/dL (ref 1.5–4.5)
Glucose: 223 mg/dL — ABNORMAL HIGH (ref 70–99)
Potassium: 4.8 mmol/L (ref 3.5–5.2)
Sodium: 137 mmol/L (ref 134–144)
Total Protein: 7.1 g/dL (ref 6.0–8.5)
eGFR: 107 mL/min/1.73 (ref >59)

## 2024-08-26 LAB — IGG, IGA, IGM
IgA/Immunoglobulin A, Serum: 256 mg/dL (ref 87–352)
IgG (Immunoglobin G), Serum: 1593 mg/dL (ref 586–1602)
IgM (Immunoglobulin M), Srm: 21 mg/dL — ABNORMAL LOW (ref 26–217)

## 2024-08-26 LAB — CBC WITH DIFFERENTIAL/PLATELET
Basophils Absolute: 0 x10E3/uL (ref 0.0–0.2)
Basos: 1 %
EOS (ABSOLUTE): 0 x10E3/uL (ref 0.0–0.4)
Eos: 1 %
Hematocrit: 39.7 % (ref 34.0–46.6)
Hemoglobin: 12.3 g/dL (ref 11.1–15.9)
Immature Grans (Abs): 0 x10E3/uL (ref 0.0–0.1)
Immature Granulocytes: 0 %
Lymphocytes Absolute: 1.2 x10E3/uL (ref 0.7–3.1)
Lymphs: 22 %
MCH: 26.2 pg — ABNORMAL LOW (ref 26.6–33.0)
MCHC: 31 g/dL — ABNORMAL LOW (ref 31.5–35.7)
MCV: 85 fL (ref 79–97)
Monocytes Absolute: 0.5 x10E3/uL (ref 0.1–0.9)
Monocytes: 9 %
Neutrophils Absolute: 3.6 x10E3/uL (ref 1.4–7.0)
Neutrophils: 67 %
Platelets: 306 x10E3/uL (ref 150–450)
RBC: 4.7 x10E6/uL (ref 3.77–5.28)
RDW: 15 % (ref 11.7–15.4)
WBC: 5.4 x10E3/uL (ref 3.4–10.8)

## 2024-08-26 LAB — STRATIFY JCV(TM) AB W/INDEX
JCV Antibody: POSITIVE — AB
JCV Index Value: 0.8

## 2024-08-26 LAB — IRON,TIBC AND FERRITIN PANEL
Ferritin: 58 ng/mL (ref 15–150)
Iron Saturation: 23 % (ref 15–55)
Iron: 80 ug/dL (ref 27–159)
Total Iron Binding Capacity: 351 ug/dL (ref 250–450)
UIBC: 271 ug/dL (ref 131–425)

## 2024-08-26 LAB — VITAMIN D 25 HYDROXY (VIT D DEFICIENCY, FRACTURES): Vit D, 25-Hydroxy: 15.1 ng/mL — ABNORMAL LOW (ref 30.0–100.0)

## 2024-08-26 LAB — CD20 B CELLS
% CD19-B Cells: 1.2 % — ABNORMAL LOW (ref 4.6–22.1)
% CD20-B Cells: 1.2 % — ABNORMAL LOW (ref 5.0–22.3)

## 2024-08-26 LAB — HGB A1C W/O EAG: Hgb A1c MFr Bld: 11.4 % — ABNORMAL HIGH (ref 4.8–5.6)

## 2024-08-26 LAB — HEPATITIS B CORE ANTIBODY, TOTAL: Hep B Core Total Ab: NEGATIVE

## 2024-08-26 LAB — HEPATITIS B SURFACE ANTIBODY,QUALITATIVE: Hep B Surface Ab, Qual: NONREACTIVE

## 2024-08-26 LAB — HEPATITIS B SURFACE ANTIGEN: Hepatitis B Surface Ag: NEGATIVE

## 2024-08-28 ENCOUNTER — Telehealth: Payer: Self-pay

## 2024-08-28 ENCOUNTER — Encounter: Payer: Self-pay | Admitting: Family Medicine

## 2024-08-28 ENCOUNTER — Ambulatory Visit (INDEPENDENT_AMBULATORY_CARE_PROVIDER_SITE_OTHER): Admitting: Family Medicine

## 2024-08-28 VITALS — BP 144/88 | HR 85 | Resp 16 | Ht 66.5 in | Wt 303.0 lb

## 2024-08-28 DIAGNOSIS — Z23 Encounter for immunization: Secondary | ICD-10-CM

## 2024-08-28 DIAGNOSIS — E559 Vitamin D deficiency, unspecified: Secondary | ICD-10-CM

## 2024-08-28 DIAGNOSIS — E1165 Type 2 diabetes mellitus with hyperglycemia: Secondary | ICD-10-CM

## 2024-08-28 DIAGNOSIS — E038 Other specified hypothyroidism: Secondary | ICD-10-CM | POA: Diagnosis not present

## 2024-08-28 DIAGNOSIS — Z7984 Long term (current) use of oral hypoglycemic drugs: Secondary | ICD-10-CM

## 2024-08-28 DIAGNOSIS — E7849 Other hyperlipidemia: Secondary | ICD-10-CM

## 2024-08-28 MED ORDER — TIRZEPATIDE 2.5 MG/0.5ML ~~LOC~~ SOAJ
2.5000 mg | SUBCUTANEOUS | 0 refills | Status: AC
Start: 2024-08-28 — End: ?

## 2024-08-28 NOTE — Patient Instructions (Addendum)
 I appreciate the opportunity to provide care to you today!    Follow up:  4 months  Labs: please stop by the lab today to get your blood drawn (CBC, CMP, TSH, Lipid profile, Vit D)  T2DM -Start Mounjaro  (tirzepatide ) 2.5 mg subcutaneous injection once weekly to aid in the management of diabetes and weight.  Recommend implementing lifestyle changes, including: -Following a balanced, low-carbohydrate diet rich in vegetables, lean proteins, and whole grains -Limiting sugary drinks, refined carbs, and processed foods -Engaging in at least 150 minutes of moderate physical activity weekly (e.g., brisk walking, cycling, swimming) -Maintaining adequate hydration (>=64 oz water daily)  Monitor blood glucose regularly and report any symptoms of hypoglycemia (shakiness, sweating, dizziness, confusion).    Please follow up if your symptoms worsen or fail to improve.    Please continue to a heart-healthy diet and increase your physical activities. Try to exercise for at least five days a week.    It was a pleasure to see you and I look forward to continuing to work together on your health and well-being. Please do not hesitate to call the office if you need care or have questions about your care.  In case of emergency, please visit the Emergency Department for urgent care, or contact our clinic at [phone] to schedule an appointment. We're here to help you!   Have a wonderful day and week. With Gratitude, Glendene Wyer MSN, FNP-BC

## 2024-08-28 NOTE — Progress Notes (Signed)
 Care Guide Pharmacy Note  08/28/2024 Name: [name] MRN: [id number] DOB: [birth year]  Referred By: Zarwolo, Gloria, FNP Reason for referral: Complex Care Management (Outreach to schedule with Pharm d )   Katrina Montes is a 53 y.o. year old female who is a primary care patient of Zarwolo, Gloria, FNP.  Brooke Verdon Pesa was referred to the pharmacist for assistance related to: DMII  An unsuccessful telephone outreach was attempted today to contact the patient who was referred to the pharmacy team for assistance with medication assistance. Additional attempts will be made to contact the patient.  Jeoffrey Buffalo , RMA     [facility]  [facility] Guide  Direct Dial : [phone]  Website: [email or web address]

## 2024-08-28 NOTE — Assessment & Plan Note (Signed)
 Encouraged to increase his intake of vitamin D-rich foods such as fatty fish (e.g., salmon, mackerel, and sardines), fortified dairy products, egg yolks, and fortified cereals.

## 2024-08-28 NOTE — Assessment & Plan Note (Signed)
 Most recent HbA1c: 11.4 Patient reports not taking Trulicity  or Mounjaro  due to cost (~$387/month) and states she could not afford these medications. She expresses concern about her weight. Reports compliance with Metformin  500 mg BID and Tresiba  40 units at bedtime. -Provided a sample of Mounjaro  2.5 mg weekly in clinic; prescription also sent to the pharmacy. -Referral placed to [facility] for medication assistance. -Encouraged to continue follow-up with Endocrinology for ongoing management.

## 2024-08-28 NOTE — Progress Notes (Signed)
 Established Patient Office Visit  Subjective:  Patient ID: Katrina Montes, female    DOB: 09/23/1971  Age: 53 y.o. MRN: [id number]  CC:  Chief Complaint  Patient presents with   Follow-up    Wants to discuss her weight concerns     HPI Katrina Montes is a 53 y.o. female with past medical history of T2DM, hypertension, obesity presents for f/u of  chronic medical conditions.  Past Medical History:  Diagnosis Date   Anemia    Anxiety    Back pain    Carpal tunnel syndrome    Constipation    Depression    Diabetes mellitus    Diabetes mellitus, type II (HCC)    Hyperlipidemia    Hypertension    Joint pain    Lactose intolerance    Multiple sclerosis (HCC) 2016   Optic neuritis    Shortness of breath    Sleep apnea    Swallowing difficulty    Swelling of both lower extremities    Vision, loss, sudden    Vitamin D  deficiency     Past Surgical History:  Procedure Laterality Date   BREAST REDUCTION SURGERY     COLONOSCOPY WITH PROPOFOL  N/A 06/02/2022   Procedure: COLONOSCOPY WITH PROPOFOL ;  Surgeon: Shaaron Lamar HERO, MD;  Location: [facility];  Service: Endoscopy;  Laterality: N/A;  8:45AM   CYST EXCISION     INCISION AND DRAINAGE ABSCESS Left 12/23/2021   Procedure: INCISION AND DRAINAGE BUTTOCK ABSCESS;  Surgeon: Aron Shoulders, MD;  Location: [facility] OR;  Service: General;  Laterality: Left;   MOUTH SURGERY  09/2023   ovarian cyst removed     TUBAL LIGATION     WISDOM TOOTH EXTRACTION      Family History  Problem Relation Age of Onset   Cancer Mother    Diabetes Mother    Hearing loss Mother    Thyroid  nodules Mother    Anemia Mother    Kidney disease Mother    Hypertension Mother    Obesity Mother    Breast cancer Mother    Hyperlipidemia Father    High blood pressure Father    Sleep apnea Father    Pancreatic cancer Father    Anemia Sister    Anemia Sister    Arthritis Maternal Grandmother    Arthritis Maternal Grandfather     Arthritis Paternal Grandmother    Kidney disease Paternal Grandmother    Hypertension Paternal Grandmother    Arthritis Paternal Grandfather    Vision loss Paternal Grandfather    Stroke Paternal Grandfather    Hyperlipidemia Paternal Grandfather    Pancreatic cancer Maternal Uncle    Kidney disease Paternal Uncle    Colon cancer Neg Hx    Colon polyps Neg Hx     Social History   Socioeconomic History   Marital status: Single    Spouse name: Not on file   Number of children: 0   Years of education: College   Highest education level: Bachelor's degree (e.g., BA, AB, BS)  Occupational History   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   Right handed.  Social Drivers of Health   Financial Resource Strain: Medium Risk (08/28/2024)   Overall Financial Resource Strain (CARDIA)    Difficulty of Paying Living Expenses: Somewhat hard  Food Insecurity: Food Insecurity Present (08/28/2024)   Hunger Vital Sign    Worried About Running Out of Food in the Last Year: Sometimes true    Ran Out of Food in the Last Year: Sometimes true  Transportation Needs: No Transportation Needs (08/28/2024)   PRAPARE - Administrator, Civil Service (Medical): No    Lack of Transportation (Non-Medical): No  Physical Activity: Insufficiently Active (08/28/2024)   Exercise Vital Sign    Days of Exercise per Week: 3 days    Minutes of Exercise per Session: 30 min  Stress: Stress Concern Present (08/28/2024)   [employer] of Occupational Health - Occupational Stress Questionnaire    Feeling of Stress: Very much  Social Connections: Moderately Integrated (08/28/2024)   Social Connection and Isolation Panel     Frequency of Communication with Friends and Family: Twice a week    Frequency of Social Gatherings with Friends and Family: Once a week    Attends Religious Services: More than 4 times per year    Active Member of [employer] or Organizations: Yes    Attends Banker Meetings: More than 4 times per year    Marital Status: Never married  Intimate Partner Violence: Not At Risk (03/05/2024)   Humiliation, Afraid, Rape, and Kick questionnaire    Fear of Current or Ex-Partner: No    Emotionally Abused: No    Physically Abused: No    Sexually Abused: No    Outpatient Medications Prior to Visit  Medication Sig Dispense Refill   acetaminophen -codeine (TYLENOL  #3) 300-30 MG tablet Take 1 tablet by mouth every 6 (six) hours as needed.     APPLE CIDER VINEGAR PO Take 1 tablet by mouth daily.     atorvastatin  (LIPITOR) 40 MG tablet Take 1 tablet (40 mg total) by mouth daily. 90 tablet 3   celecoxib  (CELEBREX ) 100 MG capsule Take 1 capsule (100 mg total) by mouth 2 (two) times daily as needed for up to 420 doses. 60 capsule 6   Cholecalciferol (VITAMIN D3) 125 MCG (5000 UT) CAPS Take 1 capsule (5,000 Units total) by mouth daily with lunch. 90 capsule 3   Continuous Glucose Receiver (FREESTYLE LIBRE 3 READER) DEVI Use to test BG 4+ times daily. E11.65 1 each 0   Continuous Glucose Sensor (FREESTYLE LIBRE 3 PLUS SENSOR) MISC Use to monitor glucose continuously as directed. Change sensor every 15 days. 2 each 2   cyclobenzaprine  (FLEXERIL ) 10 MG tablet Take 1 tablet (10 mg total) by mouth 2 (two) times daily as needed for muscle spasms. 30 tablet 3   diclofenac  Sodium (VOLTAREN  ARTHRITIS PAIN) 1 % GEL 2 gram qid prn 100 g 11   diphenhydrAMINE  (BENADRYL ) 25 MG tablet Take 25 mg by mouth every 6 (six) hours as needed for allergies.     DULoxetine  (CYMBALTA ) 60 MG capsule Take 1 capsule (60 mg total) by mouth daily. 90 capsule 3   ELDERBERRY PO Take 1,150 mg by mouth daily.     Erenumab -aooe  (AIMOVIG ) 70 MG/ML SOAJ Inject 70 mg into the skin every 30 (thirty) days. 1.12 mL 11   ezetimibe  (ZETIA ) 10 MG tablet Take 1 tablet (10 mg total) by mouth daily. 90 tablet 3   fluticasone  (FLONASE ) 50 MCG/ACT nasal spray Place 2 sprays into both nostrils daily. 16  g 0   gabapentin  (NEURONTIN ) 300 MG capsule TAKE 1 CAPSULE(300 MG) BY MOUTH THREE TIMES DAILY 90 capsule 3   ibuprofen (ADVIL,MOTRIN) 200 MG tablet Take 200 mg by mouth as needed for headache, moderate pain or mild pain.     insulin  degludec (TRESIBA  FLEXTOUCH) 200 UNIT/ML FlexTouch Pen Inject 40 Units into the skin at bedtime. 12 mL 2   Insulin  Pen Needle (BD PEN NEEDLE NANO U/F) 32G X 4 MM MISC 1 each by Does not apply route 4 (four) times daily. 100 each 2   levothyroxine  (SYNTHROID ) 50 MCG tablet TAKE 1 TABLET(50 MCG) BY MOUTH DAILY BEFORE BREAKFAST 90 tablet 1   lisinopril  (ZESTRIL ) 20 MG tablet TAKE 1 TABLET(20 MG) BY MOUTH DAILY 30 tablet 11   meclizine  (ANTIVERT ) 25 MG tablet Take 1 tablet (25 mg total) by mouth 3 (three) times daily as needed for dizziness. 30 tablet 6   metFORMIN  (GLUCOPHAGE ) 500 MG tablet TAKE 1 TABLET(500 MG) BY MOUTH TWICE DAILY WITH A MEAL 60 tablet 2   Multiple Vitamin (MULTIVITAMIN) capsule Take 1 capsule by mouth daily.     mupirocin  ointment (BACTROBAN ) 2 % Apply 1 Application topically 2 (two) times daily. 30 g 1   ocrelizumab  (OCREVUS ) 300 MG/10ML injection Inject 10 mLs (300 mg total) into the vein every 6 (six) months. 20 mL 0   traZODone  (DESYREL ) 50 MG tablet Take 2 tablets (100 mg total) by mouth at bedtime. 60 tablet 11   triamcinolone  cream (KENALOG ) 0.1 % Apply 1 Application topically 2 (two) times daily. 30 g 0   Vitamin D , Ergocalciferol , (DRISDOL ) 1.25 MG (50000 UNIT) CAPS capsule TAKE ONE CAPSULE BY MOUTH EVERY 7 DAYS 20 capsule 1   Dulaglutide  (TRULICITY ) 1.5 MG/0.5ML SOAJ Inject 1.5 mg into the skin once a week. 6 mL 1   polyethylene glycol powder (GLYCOLAX /MIRALAX ) 17 GM/SCOOP powder  Take 1 capful (17 g) in water by mouth daily. (Patient taking differently: Take 17 g by mouth daily as needed for moderate constipation.) 238 g 0   docusate sodium  (COLACE) 50 MG capsule Take 1 capsule (50 mg total) by mouth 2 (two) times daily. (Patient taking differently: Take 50 mg by mouth daily as needed for moderate constipation.) 60 capsule 11   PRESCRIPTION MEDICATION Place 1 drop into both eyes at bedtime.     promethazine -dextromethorphan (PROMETHAZINE -DM) 6.25-15 MG/5ML syrup Take 5 mLs by mouth 4 (four) times daily as needed. 118 mL 0   No facility-administered medications prior to visit.    No Known Allergies  ROS Review of Systems    Objective:    Physical Exam  BP (!) 144/88   Pulse 85   Resp 16   Ht 5' 6.5 (1.689 m)   Wt (!) 303 lb (137.4 kg)   SpO2 97%   BMI 48.17 kg/m  Wt Readings from Last 3 Encounters:  08/28/24 (!) 303 lb (137.4 kg)  05/22/24 (!) 302 lb 6.4 oz (137.2 kg)  04/30/24 298 lb (135.2 kg)    Lab Results  Component Value Date   TSH 0.763 05/22/2024   Lab Results  Component Value Date   WBC 5.4 08/19/2024   HGB 12.3 08/19/2024   HCT 39.7 08/19/2024   MCV 85 08/19/2024   PLT 306 08/19/2024   Lab Results  Component Value Date   NA 137 08/19/2024   K 4.8 08/19/2024   CO2 21 08/19/2024   GLUCOSE 223 (H) 08/19/2024   BUN 7 08/19/2024   CREATININE 0.63 08/19/2024  BILITOT 0.7 08/19/2024   ALKPHOS 102 08/19/2024   AST 19 08/19/2024   ALT 24 08/19/2024   PROT 7.1 08/19/2024   ALBUMIN 4.1 08/19/2024   CALCIUM  9.0 08/19/2024   ANIONGAP 7 12/26/2021   EGFR 107 08/19/2024   Lab Results  Component Value Date   CHOL 197 04/23/2024   Lab Results  Component Value Date   HDL 79 04/23/2024   Lab Results  Component Value Date   LDLCALC 106 (H) 04/23/2024   Lab Results  Component Value Date   TRIG 67 04/23/2024   Lab Results  Component Value Date   CHOLHDL 2.5 04/23/2024   Lab Results  Component Value Date   HGBA1C 11.4  (H) 08/19/2024      Assessment & Plan:  Type 2 diabetes mellitus with hyperglycemia, without long-term current use of insulin  (HCC) Assessment & Plan: Most recent HbA1c: 11.4 Patient reports not taking Trulicity  or Mounjaro  due to cost (~$387/month) and states she could not afford these medications. She expresses concern about her weight. Reports compliance with Metformin  500 mg BID and Tresiba  40 units at bedtime. -Provided a sample of Mounjaro  2.5 mg weekly in clinic; prescription also sent to the pharmacy. -Referral placed to [facility] for medication assistance. -Encouraged to continue follow-up with Endocrinology for ongoing management.   Orders: -     AMB Referral [facility] Care Management -     Tirzepatide ; Inject 2.5 mg into the skin once a week.  Dispense: 2 mL; Refill: 0  Vitamin D  deficiency Assessment & Plan: Encouraged to increase his intake of vitamin D -rich foods such as fatty fish (e.g., salmon, mackerel, and sardines), fortified dairy products, egg yolks, and fortified cereals.   Orders: -     VITAMIN D  25 Hydroxy (Vit-D Deficiency, Fractures)  TSH (thyroid -stimulating hormone deficiency) -     TSH + free T4  Other hyperlipidemia -     Lipid panel -     CMP14+EGFR -     CBC with Differential/Platelet   Note: This chart has been completed using Engineer, civil (consulting) software, and while attempts have been made to ensure accuracy, certain words and phrases may not be transcribed as intended.   Follow-up: Return in about 4 months (around 12/28/2024).   Lavon Bothwell, FNP

## 2024-09-03 ENCOUNTER — Telehealth: Payer: Self-pay | Admitting: Family Medicine

## 2024-09-03 ENCOUNTER — Telehealth: Payer: Self-pay | Admitting: Neurology

## 2024-09-03 NOTE — Telephone Encounter (Signed)
 Copied from CRM [id number]. Topic: Clinical - Medication Question >> Sep 03, 2024  4:39 PM [name] G wrote: Reason for CRM: Pt received a recent missed phone call possible from Ms. Rosina regarding something about pharmacy needing prior authorization for a med that her PCP recently prescribed, pt wasn't sure about info, please call pt back at [phone].

## 2024-09-03 NOTE — Telephone Encounter (Signed)
 no auth required sent to GI [phone]

## 2024-09-03 NOTE — Progress Notes (Unsigned)
 Care Guide Pharmacy Note  09/03/2024 Name: Katrina Montes MRN: [id number] DOB: Apr 03, 1971  Referred By: Zarwolo, Gloria, FNP Reason for referral: Complex Care Management (Outreach to schedule with Pharm d )   Katrina Montes is a 53 y.o. year old female who is a primary care patient of Zarwolo, Gloria, FNP.  Katrina Montes was referred to the pharmacist for assistance related to: DMII  An unsuccessful telephone outreach was attempted today to contact the patient who was referred to the pharmacy team for assistance with medication assistance. Additional attempts will be made to contact the patient.  Jeoffrey Buffalo , RMA     [facility]  [facility], [facility] Guide  Direct Dial : [phone]  Website: [email or web address]

## 2024-09-04 NOTE — Progress Notes (Signed)
 Care Guide Pharmacy Note  09/04/2024 Name: Katrina Montes MRN: [id number] DOB: 12/10/1971  Referred By: Zarwolo, Gloria, FNP Reason for referral: Complex Care Management (Outreach to schedule with Pharm d )   Katrina Montes is a 53 y.o. year old female who is a primary care patient of Zarwolo, Gloria, FNP.  Katrina Montes was referred to the pharmacist for assistance related to: DMII  A third unsuccessful telephone outreach was attempted today to contact the patient who was referred to the pharmacy team for assistance with medication assistance. The Population Health team is pleased to engage with this patient at any time in the future upon receipt of referral and should he/she be interested in assistance from the [employer] [facility] team.  Jeoffrey Buffalo , RMA     [facility]  [facility], [facility] Guide  Direct Dial : [phone]  Website: [email or web address]

## 2024-09-05 ENCOUNTER — Other Ambulatory Visit (HOSPITAL_COMMUNITY): Payer: Self-pay

## 2024-09-05 ENCOUNTER — Telehealth: Payer: Self-pay | Admitting: Pharmacy Technician

## 2024-09-05 NOTE — Telephone Encounter (Signed)
 Pharmacy Patient Advocate Encounter   Received notification from Pt Calls Messages that prior authorization for Mounjaro  2.5mg /0.78ml auto-injectors is required/requested.   Insurance verification completed.   The patient is insured through [employer] .   Per test claim: The current 28 day co-pay is, $387.00.  No PA needed at this time. This test claim was processed through [facility]- copay amounts may vary at other pharmacies due to pharmacy/plan contracts, or as the patient moves through the different stages of their insurance plan.    Copay applied to the deductible of $340.00 plus the $47.00 copay. It would be $387.00 for the first fill and then $47.00 after that. Called patient to explain and she advised she could not afford the $387.00.

## 2024-09-05 NOTE — Telephone Encounter (Signed)
 noted

## 2024-09-05 NOTE — Telephone Encounter (Signed)
 Katrina Montes, I went back and read the patient notes. I believe you may be trying to contact this patient regarding med assistance.

## 2024-09-05 NOTE — Telephone Encounter (Signed)
 Per previous message, the pharmacy team spoke with her and documented the following message: Copay applied to the deductible of $340.00 plus the $47.00 copay. It would be $387.00 for the first fill and then $47.00 after that. Called patient to explain and she advised she could not afford the $387.00.

## 2024-09-05 NOTE — Telephone Encounter (Signed)
 PA request has been Received. New Encounter has been or will be created for follow up. For additional info see Pharmacy telephone encounter from 09/05/2024.

## 2024-09-09 LAB — VITAMIN D 25 HYDROXY (VIT D DEFICIENCY, FRACTURES): Vit D, 25-Hydroxy: 15.7 ng/mL — ABNORMAL LOW (ref 30.0–100.0)

## 2024-09-09 LAB — TSH: TSH: 1.76 u[IU]/mL (ref 0.450–4.500)

## 2024-09-09 LAB — T4, FREE: Free T4: 1 ng/dL (ref 0.82–1.77)

## 2024-09-12 ENCOUNTER — Other Ambulatory Visit: Payer: Self-pay

## 2024-09-12 DIAGNOSIS — E1165 Type 2 diabetes mellitus with hyperglycemia: Secondary | ICD-10-CM

## 2024-09-12 MED ORDER — TRESIBA FLEXTOUCH 200 UNIT/ML ~~LOC~~ SOPN: 40.0000 [IU] | PEN_INJECTOR | Freq: Every day | SUBCUTANEOUS | 0 refills | Status: DC

## 2024-09-15 ENCOUNTER — Ambulatory Visit: Admitting: "Endocrinology

## 2024-09-16 LAB — OPHTHALMOLOGY REPORT-SCANNED

## 2024-09-17 ENCOUNTER — Other Ambulatory Visit: Payer: Self-pay | Admitting: Family Medicine

## 2024-09-17 DIAGNOSIS — H3500 Unspecified background retinopathy: Secondary | ICD-10-CM

## 2024-09-23 ENCOUNTER — Ambulatory Visit
Admission: RE | Admit: 2024-09-23 | Discharge: 2024-09-23 | Disposition: A | Source: Ambulatory Visit | Attending: Neurology | Admitting: Neurology

## 2024-09-23 DIAGNOSIS — G35D Multiple sclerosis, unspecified: Secondary | ICD-10-CM

## 2024-09-23 DIAGNOSIS — G35A Relapsing-remitting multiple sclerosis: Secondary | ICD-10-CM | POA: Diagnosis not present

## 2024-09-24 ENCOUNTER — Ambulatory Visit: Payer: Self-pay | Admitting: Neurology

## 2024-09-30 ENCOUNTER — Other Ambulatory Visit: Payer: Self-pay | Admitting: "Endocrinology

## 2024-09-30 DIAGNOSIS — E1165 Type 2 diabetes mellitus with hyperglycemia: Secondary | ICD-10-CM

## 2024-09-30 NOTE — Telephone Encounter (Signed)
 Please offer follow-up with nurse practitioner or myself to discuss, I have not seen her since 2022 and most recent sleep test did not show any significant sleep apnea in 2023.  She can see Lauraine or myself for an appointment.

## 2024-10-01 NOTE — Telephone Encounter (Signed)
 I recommend FU with PCP about swallowing issues. May need to see ENT, depending on the findings.

## 2024-10-03 ENCOUNTER — Other Ambulatory Visit: Payer: Self-pay | Admitting: Family Medicine

## 2024-10-20 ENCOUNTER — Encounter: Payer: Self-pay | Admitting: Radiology

## 2024-10-23 ENCOUNTER — Encounter: Payer: Self-pay | Admitting: "Endocrinology

## 2024-10-23 ENCOUNTER — Ambulatory Visit: Admitting: "Endocrinology

## 2024-10-23 ENCOUNTER — Other Ambulatory Visit: Payer: Self-pay | Admitting: "Endocrinology

## 2024-10-23 VITALS — BP 124/84 | HR 72 | Ht 66.5 in | Wt 299.8 lb

## 2024-10-23 DIAGNOSIS — E039 Hypothyroidism, unspecified: Secondary | ICD-10-CM | POA: Diagnosis not present

## 2024-10-23 DIAGNOSIS — E782 Mixed hyperlipidemia: Secondary | ICD-10-CM | POA: Diagnosis not present

## 2024-10-23 DIAGNOSIS — E559 Vitamin D deficiency, unspecified: Secondary | ICD-10-CM

## 2024-10-23 DIAGNOSIS — Z794 Long term (current) use of insulin: Secondary | ICD-10-CM

## 2024-10-23 DIAGNOSIS — E1165 Type 2 diabetes mellitus with hyperglycemia: Secondary | ICD-10-CM | POA: Diagnosis not present

## 2024-10-23 DIAGNOSIS — Z7985 Long-term (current) use of injectable non-insulin antidiabetic drugs: Secondary | ICD-10-CM

## 2024-10-23 DIAGNOSIS — Z7984 Long term (current) use of oral hypoglycemic drugs: Secondary | ICD-10-CM

## 2024-10-23 DIAGNOSIS — Z6841 Body Mass Index (BMI) 40.0 and over, adult: Secondary | ICD-10-CM

## 2024-10-23 DIAGNOSIS — I10 Essential (primary) hypertension: Secondary | ICD-10-CM | POA: Diagnosis not present

## 2024-10-23 MED ORDER — FREESTYLE LIBRE 2 PLUS SENSOR MISC
2 refills | Status: AC
Start: 2024-10-23 — End: ?

## 2024-10-23 MED ORDER — FREESTYLE LIBRE 2 READER DEVI: 0 refills | Status: AC

## 2024-10-23 MED ORDER — TRESIBA FLEXTOUCH 200 UNIT/ML ~~LOC~~ SOPN
50.0000 [IU] | PEN_INJECTOR | Freq: Every day | SUBCUTANEOUS | 2 refills | Status: DC
Start: 2024-10-23 — End: 2024-11-06

## 2024-10-23 NOTE — Patient Instructions (Signed)

## 2024-10-23 NOTE — Progress Notes (Signed)
 10/23/2024, 10:01 AM   Endocrinology follow-up note  Subjective:    Patient ID: Katrina Montes, female    DOB: Jul 26, 1971.  (Age 53)  Katrina Montes is being seen in consultation for management of currently uncontrolled symptomatic diabetes requested by  Edman Meade PEDLAR, FNP.   Past Medical History:  Diagnosis Date   Anemia    Anxiety    Back pain    Carpal tunnel syndrome    Constipation    Depression    Diabetes mellitus    Diabetes mellitus, type II (HCC)    Hyperlipidemia    Hypertension    Joint pain    Lactose intolerance    Multiple sclerosis 2016   Optic neuritis    Shortness of breath    Sleep apnea    Swallowing difficulty    Swelling of both lower extremities    Vision, loss, sudden    Vitamin D  deficiency     Past Surgical History:  Procedure Laterality Date   BREAST REDUCTION SURGERY     COLONOSCOPY WITH PROPOFOL  N/A 06/02/2022   Procedure: COLONOSCOPY WITH PROPOFOL ;  Surgeon: Shaaron Lamar HERO, MD;  Location: [facility];  Service: Endoscopy;  Laterality: N/A;  8:45AM   CYST EXCISION     INCISION AND DRAINAGE ABSCESS Left 12/23/2021   Procedure: INCISION AND DRAINAGE BUTTOCK ABSCESS;  Surgeon: Aron Shoulders, MD;  Location: [facility] OR;  Service: General;  Laterality: Left;   MOUTH SURGERY  09/2023   ovarian cyst removed     TUBAL LIGATION     WISDOM TOOTH EXTRACTION      Social History   Socioeconomic History   Marital status: Single    Spouse name: Not on file   Number of children: 0   Years of education: College   Highest education level: Bachelor's degree (e.g., BA, AB, BS)  Occupational History   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   Right handed.   Social Drivers of Health   Financial Resource Strain: Medium Risk (Age 53)   Overall Financial Resource Strain (CARDIA)    Difficulty of Paying Living Expenses: Somewhat hard  Food Insecurity: Food Insecurity Present (08/28/2024)   Hunger Vital Sign    Worried About Running Out of Food in the Last Year: Sometimes true    Ran Out of Food in the Last Year: Sometimes true  Transportation Needs: No Transportation Needs (08/28/2024)   PRAPARE - Administrator, Civil Service (Medical): No    Lack of Transportation (Non-Medical): No  Physical Activity: Insufficiently Active (08/28/2024)   Exercise Vital Sign    Days of Exercise per Week: 3 days    Minutes of Exercise per Session: 30 min  Stress: Stress Concern Present (08/28/2024)   [employer] of Occupational  Health - Occupational Stress Questionnaire    Feeling of Stress: Very much  Social Connections: Moderately Integrated (08/28/2024)   Social Connection and Isolation Panel    Frequency of Communication with Friends and Family: Twice a week    Frequency of Social Gatherings with Friends and Family: Once a week    Attends Religious Services: More than 4 times per year    Active Member of [employer] or Organizations: Yes    Attends Engineer, Structural: More than 4 times per year    Marital Status: Never married    Family History  Problem Relation Age of Onset   Cancer Mother    Diabetes Mother    Hearing loss Mother    Thyroid  nodules Mother    Anemia Mother    Kidney disease Mother    Hypertension Mother    Obesity Mother    Breast cancer Mother    Hyperlipidemia Father    High blood pressure Father    Sleep apnea Father    Pancreatic cancer Father    Anemia Sister    Anemia Sister    Arthritis Maternal Grandmother    Arthritis Maternal Grandfather    Arthritis Paternal Grandmother    Kidney disease Paternal  Grandmother    Hypertension Paternal Grandmother    Arthritis Paternal Grandfather    Vision loss Paternal Grandfather    Stroke Paternal Grandfather    Hyperlipidemia Paternal Grandfather    Pancreatic cancer Maternal Uncle    Kidney disease Paternal Uncle    Colon cancer Neg Hx    Colon polyps Neg Hx     Outpatient Encounter Medications as of 10/23/2024  Medication Sig   Continuous Glucose Receiver (FREESTYLE LIBRE 2 READER) DEVI As directed   Continuous Glucose Sensor (FREESTYLE LIBRE 2 PLUS SENSOR) MISC Change sensor every 15 days.   ublituximab-xiiy (BRIUMVI) 150 MG/6ML SOLN injection Inject 150 mg into the vein every 6 (six) months.   acetaminophen -codeine (TYLENOL  #3) 300-30 MG tablet Take 1 tablet by mouth every 6 (six) hours as needed.   APPLE CIDER VINEGAR PO Take 1 tablet by mouth daily.   atorvastatin  (LIPITOR) 40 MG tablet Take 1 tablet (40 mg total) by mouth daily.   celecoxib  (CELEBREX ) 100 MG capsule Take 1 capsule (100 mg total) by mouth 2 (two) times daily as needed for up to 420 doses.   Cholecalciferol (VITAMIN D3) 125 MCG (5000 UT) CAPS Take 1 capsule (5,000 Units total) by mouth daily with lunch.   cyclobenzaprine  (FLEXERIL ) 10 MG tablet Take 1 tablet (10 mg total) by mouth 2 (two) times daily as needed for muscle spasms.   diclofenac  Sodium (VOLTAREN  ARTHRITIS PAIN) 1 % GEL 2 gram qid prn   diphenhydrAMINE  (BENADRYL ) 25 MG tablet Take 25 mg by mouth every 6 (six) hours as needed for allergies.   DULoxetine  (CYMBALTA ) 60 MG capsule Take 1 capsule (60 mg total) by mouth daily.   ELDERBERRY PO Take 1,150 mg by mouth daily.   Erenumab -aooe (AIMOVIG ) 70 MG/ML SOAJ Inject 70 mg into the skin every 30 (thirty) days.   ezetimibe  (ZETIA ) 10 MG tablet Take 1 tablet (10 mg total) by mouth daily.   fluticasone  (FLONASE ) 50 MCG/ACT nasal spray Place 2 sprays into both nostrils daily.   gabapentin  (NEURONTIN ) 300 MG capsule TAKE 1 CAPSULE(300 MG) BY MOUTH THREE TIMES DAILY    ibuprofen (ADVIL,MOTRIN) 200 MG tablet Take 200 mg by mouth as needed for headache, moderate pain or mild pain.   insulin   degludec (TRESIBA  FLEXTOUCH) 200 UNIT/ML FlexTouch Pen Inject 50 Units into the skin at bedtime.   Insulin  Pen Needle (BD PEN NEEDLE NANO U/F) 32G X 4 MM MISC 1 each by Does not apply route 4 (four) times daily.   levothyroxine  (SYNTHROID ) 50 MCG tablet TAKE 1 TABLET(50 MCG) BY MOUTH DAILY BEFORE BREAKFAST   lisinopril  (ZESTRIL ) 20 MG tablet TAKE 1 TABLET(20 MG) BY MOUTH DAILY   meclizine  (ANTIVERT ) 25 MG tablet Take 1 tablet (25 mg total) by mouth 3 (three) times daily as needed for dizziness.   metFORMIN  (GLUCOPHAGE ) 500 MG tablet TAKE 1 TABLET(500 MG) BY MOUTH TWICE DAILY WITH A MEAL   Multiple Vitamin (MULTIVITAMIN) capsule Take 1 capsule by mouth daily.   mupirocin  ointment (BACTROBAN ) 2 % Apply 1 Application topically 2 (two) times daily.   tirzepatide  (MOUNJARO ) 2.5 MG/0.5ML Pen Inject 2.5 mg into the skin once a week.   traZODone  (DESYREL ) 50 MG tablet Take 2 tablets (100 mg total) by mouth at bedtime.   triamcinolone  cream (KENALOG ) 0.1 % Apply 1 Application topically 2 (two) times daily.   Vitamin D , Ergocalciferol , (DRISDOL ) 1.25 MG (50000 UNIT) CAPS capsule TAKE ONE CAPSULE BY MOUTH EVERY 7 DAYS   [DISCONTINUED] Continuous Glucose Receiver (FREESTYLE LIBRE 3 READER) DEVI Use to test BG 4+ times daily. E11.65   [DISCONTINUED] Continuous Glucose Sensor (FREESTYLE LIBRE 3 PLUS SENSOR) MISC Use to monitor glucose continuously as directed. Change sensor every 15 days.   [DISCONTINUED] insulin  degludec (TRESIBA  FLEXTOUCH) 200 UNIT/ML FlexTouch Pen Inject 40 Units into the skin at bedtime.   [DISCONTINUED] ocrelizumab  (OCREVUS ) 300 MG/10ML injection Inject 10 mLs (300 mg total) into the vein every 6 (six) months.   [DISCONTINUED] polyethylene glycol powder (GLYCOLAX /MIRALAX ) 17 GM/SCOOP powder Take 1 capful (17 g) in water by mouth daily. (Patient taking differently: Take 17  g by mouth daily as needed for moderate constipation.)   No facility-administered encounter medications on file as of 10/23/2024.    ALLERGIES: No Known Allergies  VACCINATION STATUS: Immunization History  Administered Date(s) Administered   Influenza Whole 10/24/2010   Influenza, Seasonal, Injecte, Preservative Fre 11/27/2023, 08/28/2024   Influenza,inj,Quad PF,6+ Mos 09/01/2014, 03/14/2019, 09/17/2019, 12/02/2020, 01/27/2022, 10/11/2022   Moderna Covid-19 Vaccine Bivalent Booster 13yrs & up 07/21/2021   Moderna SARS-COV2 Booster Vaccination 11/16/2020   Moderna Sars-Covid-2 Vaccination 02/09/2020, 03/09/2020   PNEUMOCOCCAL CONJUGATE-20 08/28/2024   Pneumococcal Polysaccharide-23 08/04/2014   Tdap 09/17/2019    Diabetes She presents for her follow-up diabetic visit. She has type 2 diabetes mellitus. Onset time: She was diagnosed at approximate age of 15 years. Her disease course has been worsening. There are no hypoglycemic associated symptoms. Pertinent negatives for hypoglycemia include no confusion, headaches, pallor or seizures. Pertinent negatives for diabetes include no chest pain, no fatigue, no polydipsia, no polyphagia and no polyuria. There are no hypoglycemic complications. Symptoms are worsening (More recently she has shown near target glycemic profile on insulin  treatment.  She presents with a meter showing average blood glucose of 123 mg per DL). There are no diabetic complications. Risk factors for coronary artery disease include diabetes mellitus, dyslipidemia, hypertension, obesity and sedentary lifestyle. Current diabetic treatment includes insulin  injections. Her weight is fluctuating minimally. She is following a generally unhealthy diet. When asked about meal planning, she reported none. She has not had a previous visit with a dietitian. She rarely participates in exercise. Her home blood glucose trend is increasing steadily. Her overall blood glucose range is >200 mg/dl.  (Ms. Tibbetts comes  in without any logs nor meter.  She has not used her CGM either.  She presents with worsening glycemic profile and A1c of 0.5% from her PCP office, previously 10.5%.  She denies hypoglycemia.  She has switched her Trulicity  to Mounjaro  2.5 mg weekly via her PMD.   ) An ACE inhibitor/angiotensin II receptor blocker is being taken.  Hypertension This is a chronic problem. The current episode started more than 1 year ago. Pertinent negatives include no chest pain, headaches, palpitations or shortness of breath. Risk factors for coronary artery disease include dyslipidemia, diabetes mellitus, obesity, sedentary lifestyle and smoking/tobacco exposure. Past treatments include ACE inhibitors.  Hyperlipidemia This is a chronic problem. The current episode started more than 1 year ago. Exacerbating diseases include diabetes and obesity. Pertinent negatives include no chest pain, myalgias or shortness of breath. Risk factors for coronary artery disease include diabetes mellitus, dyslipidemia, family history, hypertension, obesity and a sedentary lifestyle.      Objective:       10/23/2024    9:34 AM 08/28/2024    8:28 AM 08/14/2024   10:36 AM  Vitals with BMI  Height 5' 6.5 5' 6.5   Weight 299 lbs 13 oz 303 lbs   BMI 47.67 48.18   Systolic 124 144 852  Diastolic 84 88 89  Pulse 72 85     BP 124/84   Pulse 72   Ht 5' 6.5 (1.689 m)   Wt 299 lb 12.8 oz (136 kg)   BMI 47.66 kg/m   Wt Readings from Last 3 Encounters:  10/23/24 299 lb 12.8 oz (136 kg)  08/28/24 (!) 303 lb (137.4 kg)  05/22/24 (!) 302 lb 6.4 oz (137.2 kg)      CMP ( most recent) CMP     Component Value Date/Time   NA 137 08/19/2024 1148   K 4.8 08/19/2024 1148   CL 102 08/19/2024 1148   CO2 21 08/19/2024 1148   GLUCOSE 223 (H) 08/19/2024 1148   GLUCOSE 127 (H) 12/26/2021 0212   BUN 7 08/19/2024 1148   CREATININE 0.63 08/19/2024 1148   CREATININE 0.67 11/19/2020 0931   CALCIUM  9.0 08/19/2024  1148   PROT 7.1 08/19/2024 1148   ALBUMIN 4.1 08/19/2024 1148   AST 19 08/19/2024 1148   ALT 24 08/19/2024 1148   ALKPHOS 102 08/19/2024 1148   BILITOT 0.7 08/19/2024 1148   GFRNONAA >60 12/26/2021 0212   GFRAA 120 07/19/2020 0813     Diabetic Labs (most recent): Lab Results  Component Value Date   HGBA1C 11.4 (H) 08/19/2024   HGBA1C 10.5 (H) 04/23/2024   HGBA1C 9.4 (H) 11/27/2023   MICROALBUR 80 07/20/2022   MICROALBUR 1.1 11/19/2020   MICROALBUR 0.9 05/14/2019     Lipid Panel ( most recent) Lipid Panel     Component Value Date/Time   CHOL 197 04/23/2024 0853   TRIG 67 04/23/2024 0853   HDL 79 04/23/2024 0853   CHOLHDL 2.5 04/23/2024 0853   CHOLHDL 2.5 11/19/2020 0931   VLDL 23 11/02/2014 0944   LDLCALC 106 (H) 04/23/2024 0853   LDLCALC 87 11/19/2020 0931   LABVLDL 12 04/23/2024 0853      Lab Results  Component Value Date   TSH 1.760 09/08/2024   TSH 0.763 05/22/2024   TSH 1.860 04/23/2024   TSH 1.180 11/27/2023   TSH 1.490 08/24/2023   TSH 1.080 04/25/2023   TSH 1.270 01/24/2023   TSH 1.580 09/12/2022   TSH 0.900 01/27/2022   TSH 1.050  10/21/2018   FREET4 1.00 09/08/2024   FREET4 1.04 05/22/2024   FREET4 0.79 (L) 04/23/2024   FREET4 0.96 11/27/2023   FREET4 1.05 08/24/2023   FREET4 0.80 (L) 04/25/2023   FREET4 0.88 01/24/2023   FREET4 0.94 09/12/2022   FREET4 0.84 01/27/2022       Assessment & Plan:   1. Poorly controlled type 2 diabetes mellitus (HCC)  - Deetta Loyd Salvador has currently uncontrolled symptomatic type 2 DM since  53 years of age.  Ms. Lengacher comes in without any logs nor meter.  She has not used her CGM either.  She presents with worsening glycemic profile and A1c of 0.5% from her PCP office, previously 10.5%.  She denies hypoglycemia.  She has switched her Trulicity  to Mounjaro  2.5 mg weekly via her PMD.   Recent labs reviewed. - I had a long discussion with her about the progressive nature of diabetes and the pathology  behind its complications. -her diabetes is complicated by obesity/sedentary life and she remains at a high risk for more acute and chronic complications which include CAD, CVA, CKD, retinopathy, and neuropathy. These are all discussed in detail with her.  - I discussed all available options of managing her diabetes including de-escalation of medications. I have counseled her on diet  and weight management  by adopting a Whole Food , Plant Predominant  ( WFPP) nutrition as recommended by [employer] of Lifestyle Medicine. Patient is encouraged to switch to  unprocessed or minimally processed  complex starch, adequate protein intake (mainly plant source), minimal liquid fat ( mainly vegetable oils), plenty of fruits, and vegetables. -  she is advised to stick to a routine mealtimes to eat 3 complete meals a day and snack only when necessary ( to snack only to correct hypoglycemia BG <70 day time or <100 at night).   - she acknowledges that there is a room for improvement in her food and drink choices. - Suggestion is made for her to avoid simple carbohydrates  from her diet including Cakes, Sweet Desserts, Ice Cream, Soda (diet and regular), Sweet Tea, Candies, Chips, Cookies, Store Bought Juices, Alcohol , Artificial Sweeteners,  Coffee Creamer, and Sugar-free Products, Lemonade. This will help patient to have more stable blood glucose profile and potentially avoid unintended weight gain.   - she has been  scheduled with Penny Crumpton, RDN, CDE for individualized diabetes education.  - I have approached her with the following plan to manage  her diabetes and patient agrees:   -She will continue to need insulin  treatment in order for her to achieve control of diabetes to target.  This patient will likely need multiple daily injections of insulin , however her commitment for monitoring is not assured.    - She has not consistently used her CGM nor any glucometer.   - This makes it difficult to  optimize her insulin  treatment.  I discussed and increased her Tresiba  to 15 units nightly.   - I discussed and prescribed the freestyle libre device once again for her.  - She will benefit from GLP-1 receptor agonist, currently on Mounjaro  2.5 mg subcutaneously weekly.  She does not report any side effects, may advance to 5 mg subcutaneously weekly.  -She is advised to continue metformin  500 mg p.o. twice daily. - Specific targets for  A1c;  LDL, HDL,  and Triglycerides were discussed with the patient.  2) Blood Pressure /Hypertension:    Her blood pressure is controlled to target.  she is advised  to continue her current medications including lisinopril  10 mg p.o. daily with breakfast .   3) Lipids/Hyperlipidemia:   Review of her recent lipid panel showed   worsening at LDL of 106.  She did not engage with whole food plant-based diet optimally.  She is encouraged to stay on atorvastatin  40 mg p.o. nightly.  She is interested to see the dietitian again, referral is arranged.   Side effects and precautions discussed with her.  4)  Weight/Diet:  Body mass index is 47.66 kg/m.  -She is gaining weight.  Her higher BMI is clearly complicating her diabetes care.   she is  a candidate for weight loss. I discussed with her the fact that loss of 5 - 10% of her  current body weight will have the most impact on her diabetes management.  The above detailed  [facility] recommendations for nutrition, exercise, sleep, social life, avoidance of risky substances, the need for restorative sleep   information will also detailed on discharge instructions.  5) vitamin D  deficiency: She is advised to continue vitamin D2 50,000 units weekly.    I discussed and added vitamin D3 5000 units daily.  6) hypothyroidism: Recent diagnosis She is currently on levothyroxine  50 mcg p.o. daily for breakfast, which seems to have stabilized her thyroid  function tests.   - We discussed about the correct intake of her thyroid  hormone, on  empty stomach at fasting, with water, separated by at least 30 minutes from breakfast and other medications,  and separated by more than 4 hours from calcium , iron , multivitamins, acid reflux medications (PPIs). -Patient is made aware of the fact that thyroid  hormone replacement is needed for life, dose to be adjusted by periodic monitoring of thyroid  function tests.  7) Chronic Care/Health Maintenance:  -she  is on ACEI/ARB and Statin medications and  is encouraged to initiate and continue to follow up with Ophthalmology, Dentist,  Podiatrist at least yearly or according to recommendations, and advised to   stay away from smoking. I have recommended yearly flu vaccine and pneumonia vaccine at least every 5 years; moderate intensity exercise for up to 150 minutes weekly; and  sleep for 7- 9 hours a day. She is on ongoing vitamin D3 supplement 5000 units daily.   - she is  advised to maintain close follow up with Bac[place], Meade PEDLAR, FNP for primary care needs, as well as her other providers for optimal and coordinated care.  I spent  40  minutes in the care of the patient today including review of labs from CMP, Lipids, Thyroid  Function, Hematology (current and previous including abstractions from other facilities); face-to-face time discussing  her blood glucose readings/logs, discussing hypoglycemia and hyperglycemia episodes and symptoms, medications doses, her options of short and long term treatment based on the latest standards of care / guidelines;  discussion about incorporating lifestyle medicine;  and documenting the encounter. Risk reduction counseling performed per USPSTF guidelines to reduce  obesity and cardiovascular risk factors.     Please refer to Patient Instructions for Blood Glucose Monitoring and Insulin /Medications Dosing Guide  in media tab for additional information. Please  also refer to  Patient Self Inventory in the Media  tab for reviewed elements of pertinent patient  history.  Alanah Verdon Montes participated in the discussions, expressed understanding, and voiced agreement with the above plans.  All questions were answered to her satisfaction. she is encouraged to contact clinic should she have any questions or concerns prior to her return visit.  Follow up plan: - Return in about 9 weeks (around 12/25/2024) for Bring Meter/CGM Device/Logs- A1c in Office.  Ranny Earl, MD [facility] [address] [place], [place] [address] Phone: [phone]  Fax: [phone]    10/23/2024, 10:01 AM  This note was partially dictated with voice recognition software. Similar sounding words can be transcribed inadequately or may not  be corrected upon review.

## 2024-11-05 ENCOUNTER — Other Ambulatory Visit: Payer: Self-pay | Admitting: "Endocrinology

## 2024-11-05 DIAGNOSIS — E1165 Type 2 diabetes mellitus with hyperglycemia: Secondary | ICD-10-CM

## 2024-11-24 ENCOUNTER — Telehealth: Payer: Self-pay | Admitting: Nutrition

## 2024-11-24 NOTE — Telephone Encounter (Signed)
 VM left to call and r/s appt due to inclement weather tomorrow. Opening at 10 am instead of 8 am.

## 2024-11-25 ENCOUNTER — Encounter: Admitting: Nutrition

## 2024-12-13 ENCOUNTER — Other Ambulatory Visit: Payer: Self-pay | Admitting: Family Medicine

## 2024-12-24 ENCOUNTER — Encounter: Admitting: Nutrition

## 2024-12-25 ENCOUNTER — Ambulatory Visit: Admitting: "Endocrinology

## 2025-01-01 ENCOUNTER — Telehealth: Admitting: Family Medicine

## 2025-01-01 VITALS — Wt 298.2 lb

## 2025-01-01 DIAGNOSIS — E038 Other specified hypothyroidism: Secondary | ICD-10-CM

## 2025-01-01 DIAGNOSIS — L0292 Furuncle, unspecified: Secondary | ICD-10-CM | POA: Diagnosis not present

## 2025-01-01 DIAGNOSIS — E782 Mixed hyperlipidemia: Secondary | ICD-10-CM

## 2025-01-01 DIAGNOSIS — Z7984 Long term (current) use of oral hypoglycemic drugs: Secondary | ICD-10-CM

## 2025-01-01 DIAGNOSIS — E559 Vitamin D deficiency, unspecified: Secondary | ICD-10-CM

## 2025-01-01 DIAGNOSIS — E1165 Type 2 diabetes mellitus with hyperglycemia: Secondary | ICD-10-CM

## 2025-01-01 DIAGNOSIS — T300 Burn of unspecified body region, unspecified degree: Secondary | ICD-10-CM

## 2025-01-01 MED ORDER — METFORMIN HCL 1000 MG PO TABS: 1000.0000 mg | ORAL_TABLET | Freq: Two times a day (BID) | ORAL | 3 refills | Status: AC

## 2025-01-01 MED ORDER — GLIPIZIDE 5 MG PO TABS: 5.0000 mg | ORAL_TABLET | Freq: Two times a day (BID) | ORAL | 3 refills | Status: AC

## 2025-01-01 MED ORDER — SILVER SULFADIAZINE 1 % EX CREA: 1.0000 | TOPICAL_CREAM | Freq: Every day | CUTANEOUS | 0 refills | Status: AC
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
## 2025-01-04 DIAGNOSIS — L0292 Furuncle, unspecified: Secondary | ICD-10-CM | POA: Insufficient documentation

## 2025-01-04 DIAGNOSIS — T300 Burn of unspecified body region, unspecified degree: Secondary | ICD-10-CM | POA: Insufficient documentation

## 2025-01-04 NOTE — Assessment & Plan Note (Signed)
 Continue topical treatment as prescribed since the lesion is improving. Instruct patient to keep the area clean and dry. Avoid squeezing or manipulating the lesion. Monitor for signs of infection, including increasing redness, warmth, swelling, pain, drainage, fever, or chills. Consider oral antibiotics or culture if symptoms worsen or fail to improve.

## 2025-01-04 NOTE — Assessment & Plan Note (Signed)
 Cleanse the area gently with mild soap and water. Apply a topical antibiotic ointment or burn cream as directed. Keep the area covered with a non-adherent dressing to promote healing and prevent friction. Avoid tight clothing or further irritation to the area. Monitor closely for signs of infection.

## 2025-01-04 NOTE — Assessment & Plan Note (Signed)
 The patient reports a recent illness during which her blood glucose levels ranged from 192 to 300 mg/dL. She reports compliance with her treatment regimen. She denies symptoms of hyperglycemia, including polyuria, polyphagia, and polydipsia. The patient was encouraged to continue her current treatment regimen. She was counseled on reducing intake of high-sugar foods and beverages and increasing physical activity as tolerated to help improve glycemic control. The patient was encouraged to continue follow-up with endocrinology as scheduled. The patient verbalized understanding and agrees with the plan.

## 2025-01-12 ENCOUNTER — Encounter: Payer: Self-pay | Admitting: Family Medicine

## 2025-01-14 ENCOUNTER — Other Ambulatory Visit: Payer: Self-pay | Admitting: Family Medicine

## 2025-01-14 DIAGNOSIS — T300 Burn of unspecified body region, unspecified degree: Secondary | ICD-10-CM

## 2025-01-14 NOTE — Telephone Encounter (Signed)
 A referral has been placed to wound care for further evaluation and collaborative care. Please instruct the patient to apply the prescribed cream, followed by a non-stick gauze pad (Telfa). The dressing should be secured with paper tape, gauze wrap, or a cohesive wrap. She may stop by the office to obtain these supplies.

## 2025-01-20 ENCOUNTER — Ambulatory Visit (HOSPITAL_COMMUNITY): Admitting: Physical Therapy

## 2025-01-23 ENCOUNTER — Other Ambulatory Visit: Payer: Self-pay

## 2025-01-23 ENCOUNTER — Ambulatory Visit (HOSPITAL_COMMUNITY): Admitting: Physical Therapy

## 2025-01-23 DIAGNOSIS — M79605 Pain in left leg: Secondary | ICD-10-CM

## 2025-01-23 DIAGNOSIS — T24202S Burn of second degree of unspecified site of left lower limb, except ankle and foot, sequela: Secondary | ICD-10-CM
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
## 2025-01-23 NOTE — Addendum Note (Signed)
 Addended by: NELWYN MONTIE PARAS on: 01/23/2025 02:58 PM   Modules accepted: Orders

## 2025-01-27 ENCOUNTER — Ambulatory Visit (HOSPITAL_COMMUNITY): Admitting: Physical Therapy

## 2025-01-30 ENCOUNTER — Ambulatory Visit (HOSPITAL_COMMUNITY): Admitting: Physical Therapy

## 2025-02-03 ENCOUNTER — Ambulatory Visit (HOSPITAL_COMMUNITY): Admitting: Physical Therapy

## 2025-02-06 ENCOUNTER — Ambulatory Visit (HOSPITAL_COMMUNITY)

## 2025-02-10 ENCOUNTER — Ambulatory Visit (HOSPITAL_COMMUNITY): Admitting: Physical Therapy

## 2025-02-11 ENCOUNTER — Ambulatory Visit (HOSPITAL_COMMUNITY): Admitting: Physical Therapy

## 2025-02-13 ENCOUNTER — Ambulatory Visit (HOSPITAL_COMMUNITY)

## 2025-03-10 ENCOUNTER — Ambulatory Visit

## 2025-05-01 ENCOUNTER — Ambulatory Visit: Payer: Self-pay
